# Patient Record
Sex: Male | Born: 1940 | Race: White | Hispanic: No | Marital: Married
Health system: Southern US, Community
[De-identification: ages and names within clinical notes are randomized; demographics above are authoritative.]

## PROBLEM LIST (undated history)

## (undated) DIAGNOSIS — G2581 Restless legs syndrome: Secondary | ICD-10-CM

## (undated) DIAGNOSIS — C669 Malignant neoplasm of unspecified ureter: Secondary | ICD-10-CM

## (undated) DIAGNOSIS — D131 Benign neoplasm of stomach: Secondary | ICD-10-CM

## (undated) DIAGNOSIS — M5136 Other intervertebral disc degeneration, lumbar region: Secondary | ICD-10-CM

## (undated) DIAGNOSIS — G4700 Insomnia, unspecified: Secondary | ICD-10-CM

## (undated) DIAGNOSIS — I491 Atrial premature depolarization: Secondary | ICD-10-CM

## (undated) DIAGNOSIS — Z860100 Personal history of colon polyps, unspecified: Secondary | ICD-10-CM

## (undated) DIAGNOSIS — K635 Polyp of colon: Secondary | ICD-10-CM

## (undated) DIAGNOSIS — E875 Hyperkalemia: Secondary | ICD-10-CM

## (undated) DIAGNOSIS — M810 Age-related osteoporosis without current pathological fracture: Secondary | ICD-10-CM

## (undated) DIAGNOSIS — K2970 Gastritis, unspecified, without bleeding: Secondary | ICD-10-CM

## (undated) DIAGNOSIS — G4730 Sleep apnea, unspecified: Secondary | ICD-10-CM

## (undated) DIAGNOSIS — M5030 Other cervical disc degeneration, unspecified cervical region: Secondary | ICD-10-CM

## (undated) DIAGNOSIS — C61 Malignant neoplasm of prostate: Secondary | ICD-10-CM

## (undated) DIAGNOSIS — M8580 Other specified disorders of bone density and structure, unspecified site: Secondary | ICD-10-CM

## (undated) DIAGNOSIS — N183 Chronic kidney disease, stage 3 unspecified: Secondary | ICD-10-CM

## (undated) DIAGNOSIS — C4490 Unspecified malignant neoplasm of skin, unspecified: Secondary | ICD-10-CM

## (undated) DIAGNOSIS — R001 Bradycardia, unspecified: Secondary | ICD-10-CM

## (undated) DIAGNOSIS — G4733 Obstructive sleep apnea (adult) (pediatric): Secondary | ICD-10-CM

## (undated) DIAGNOSIS — H269 Unspecified cataract: Secondary | ICD-10-CM

## (undated) DIAGNOSIS — K317 Polyp of stomach and duodenum: Secondary | ICD-10-CM

## (undated) DIAGNOSIS — R319 Hematuria, unspecified: Secondary | ICD-10-CM

## (undated) DIAGNOSIS — C801 Malignant (primary) neoplasm, unspecified: Secondary | ICD-10-CM

## (undated) DIAGNOSIS — M545 Low back pain, unspecified: Secondary | ICD-10-CM

## (undated) DIAGNOSIS — C9000 Multiple myeloma not having achieved remission: Secondary | ICD-10-CM

## (undated) DIAGNOSIS — M51369 Other intervertebral disc degeneration, lumbar region without mention of lumbar back pain or lower extremity pain: Secondary | ICD-10-CM

## (undated) DIAGNOSIS — D649 Anemia, unspecified: Secondary | ICD-10-CM

## (undated) DIAGNOSIS — K625 Hemorrhage of anus and rectum: Secondary | ICD-10-CM

## (undated) DIAGNOSIS — K2270 Barrett's esophagus without dysplasia: Secondary | ICD-10-CM

## (undated) DIAGNOSIS — S7290XA Unspecified fracture of unspecified femur, initial encounter for closed fracture: Secondary | ICD-10-CM

## (undated) DIAGNOSIS — I10 Essential (primary) hypertension: Secondary | ICD-10-CM

## (undated) DIAGNOSIS — K769 Liver disease, unspecified: Secondary | ICD-10-CM

## (undated) DIAGNOSIS — N189 Chronic kidney disease, unspecified: Secondary | ICD-10-CM

## (undated) DIAGNOSIS — K219 Gastro-esophageal reflux disease without esophagitis: Secondary | ICD-10-CM

## (undated) HISTORY — PX: TONSILLECTOMY: SUR1361

## (undated) HISTORY — PX: EYE SURGERY: SHX253

## (undated) HISTORY — PX: FLEXIBLE SIGMOIDOSCOPY: SHX1649

## (undated) HISTORY — PX: OTHER SURGICAL HISTORY: SHX169

## (undated) HISTORY — PX: BAND HEMORRHOIDECTOMY: SHX1213

## (undated) HISTORY — PX: FRACTURE SURGERY: SHX138

## (undated) HISTORY — PX: COLONOSCOPY: SHX5424

## (undated) HISTORY — PX: ORIF DISTAL FEMUR FRACTURE: SUR926

---

## 2004-11-07 ENCOUNTER — Ambulatory Visit: Payer: Self-pay | Admitting: Orthopedic Surgery

## 2007-02-12 ENCOUNTER — Ambulatory Visit: Payer: Self-pay | Admitting: Gastroenterology

## 2007-05-27 ENCOUNTER — Ambulatory Visit: Payer: Self-pay | Admitting: Gastroenterology

## 2007-10-28 ENCOUNTER — Ambulatory Visit: Payer: Self-pay | Admitting: Gastroenterology

## 2008-02-10 ENCOUNTER — Ambulatory Visit: Payer: Self-pay | Admitting: Gastroenterology

## 2008-05-06 ENCOUNTER — Ambulatory Visit: Payer: Self-pay | Admitting: Gastroenterology

## 2009-09-16 ENCOUNTER — Ambulatory Visit: Payer: Self-pay | Admitting: Surgery

## 2009-09-21 ENCOUNTER — Ambulatory Visit: Payer: Self-pay | Admitting: Surgery

## 2010-03-29 ENCOUNTER — Ambulatory Visit: Payer: Self-pay | Admitting: Gastroenterology

## 2011-05-22 ENCOUNTER — Ambulatory Visit: Payer: Self-pay | Admitting: Internal Medicine

## 2011-05-25 ENCOUNTER — Ambulatory Visit: Payer: Self-pay | Admitting: Internal Medicine

## 2011-06-04 ENCOUNTER — Ambulatory Visit: Payer: Self-pay | Admitting: Internal Medicine

## 2011-06-06 ENCOUNTER — Ambulatory Visit: Payer: Self-pay | Admitting: Internal Medicine

## 2011-07-05 ENCOUNTER — Ambulatory Visit: Payer: Self-pay | Admitting: Internal Medicine

## 2011-10-04 ENCOUNTER — Ambulatory Visit: Payer: Self-pay | Admitting: Internal Medicine

## 2012-05-24 ENCOUNTER — Ambulatory Visit: Payer: Self-pay | Admitting: Gastroenterology

## 2012-05-28 LAB — PATHOLOGY REPORT

## 2012-08-13 ENCOUNTER — Ambulatory Visit: Payer: Self-pay

## 2012-09-03 ENCOUNTER — Ambulatory Visit: Payer: Self-pay | Admitting: Internal Medicine

## 2012-10-04 ENCOUNTER — Ambulatory Visit: Payer: Self-pay | Admitting: Internal Medicine

## 2013-02-01 ENCOUNTER — Ambulatory Visit: Payer: Self-pay | Admitting: Internal Medicine

## 2013-02-12 ENCOUNTER — Ambulatory Visit: Payer: Self-pay | Admitting: Internal Medicine

## 2013-02-12 LAB — HEPATIC FUNCTION PANEL A (ARMC)
Albumin: 3.3 g/dL — ABNORMAL LOW (ref 3.4–5.0)
Alkaline Phosphatase: 98 U/L (ref 50–136)
Bilirubin, Direct: 0.1 mg/dL (ref 0.00–0.20)
Bilirubin,Total: 0.5 mg/dL (ref 0.2–1.0)

## 2013-02-12 LAB — CBC CANCER CENTER
Basophil #: 0 x10 3/mm (ref 0.0–0.1)
Eosinophil #: 0.2 x10 3/mm (ref 0.0–0.7)
Eosinophil %: 4.5 %
HCT: 46.9 % (ref 40.0–52.0)
HGB: 15.4 g/dL (ref 13.0–18.0)
Lymphocyte #: 1.3 x10 3/mm (ref 1.0–3.6)
Lymphocyte %: 23.7 %
MCH: 29 pg (ref 26.0–34.0)
MCHC: 33 g/dL (ref 32.0–36.0)
MCV: 88 fL (ref 80–100)
Neutrophil %: 59.9 %
Platelet: 171 x10 3/mm (ref 150–440)
RBC: 5.33 10*6/uL (ref 4.40–5.90)
RDW: 13.7 % (ref 11.5–14.5)
WBC: 5.3 x10 3/mm (ref 3.8–10.6)

## 2013-02-12 LAB — CREATININE, SERUM
Creatinine: 1.46 mg/dL — ABNORMAL HIGH (ref 0.60–1.30)
EGFR (African American): 55 — ABNORMAL LOW
EGFR (Non-African Amer.): 47 — ABNORMAL LOW

## 2013-03-04 ENCOUNTER — Ambulatory Visit: Payer: Self-pay | Admitting: Internal Medicine

## 2013-05-26 ENCOUNTER — Ambulatory Visit: Payer: Self-pay | Admitting: Gastroenterology

## 2013-05-27 LAB — PATHOLOGY REPORT

## 2013-12-30 DIAGNOSIS — L723 Sebaceous cyst: Secondary | ICD-10-CM | POA: Diagnosis not present

## 2013-12-30 DIAGNOSIS — Z85828 Personal history of other malignant neoplasm of skin: Secondary | ICD-10-CM | POA: Diagnosis not present

## 2013-12-30 DIAGNOSIS — D235 Other benign neoplasm of skin of trunk: Secondary | ICD-10-CM | POA: Diagnosis not present

## 2014-03-30 DIAGNOSIS — H40019 Open angle with borderline findings, low risk, unspecified eye: Secondary | ICD-10-CM | POA: Diagnosis not present

## 2014-04-29 DIAGNOSIS — S61209A Unspecified open wound of unspecified finger without damage to nail, initial encounter: Secondary | ICD-10-CM | POA: Diagnosis not present

## 2014-05-18 DIAGNOSIS — S61209A Unspecified open wound of unspecified finger without damage to nail, initial encounter: Secondary | ICD-10-CM | POA: Diagnosis not present

## 2014-05-18 DIAGNOSIS — Z4802 Encounter for removal of sutures: Secondary | ICD-10-CM | POA: Diagnosis not present

## 2014-05-26 DIAGNOSIS — K2270 Barrett's esophagus without dysplasia: Secondary | ICD-10-CM | POA: Insufficient documentation

## 2014-05-26 DIAGNOSIS — IMO0001 Reserved for inherently not codable concepts without codable children: Secondary | ICD-10-CM | POA: Diagnosis not present

## 2014-06-09 ENCOUNTER — Ambulatory Visit: Payer: Self-pay | Admitting: Gastroenterology

## 2014-06-09 DIAGNOSIS — K229 Disease of esophagus, unspecified: Secondary | ICD-10-CM | POA: Diagnosis not present

## 2014-06-09 DIAGNOSIS — K2100 Gastro-esophageal reflux disease with esophagitis, without bleeding: Secondary | ICD-10-CM | POA: Diagnosis not present

## 2014-06-09 DIAGNOSIS — K2270 Barrett's esophagus without dysplasia: Secondary | ICD-10-CM | POA: Diagnosis not present

## 2014-06-09 DIAGNOSIS — K219 Gastro-esophageal reflux disease without esophagitis: Secondary | ICD-10-CM | POA: Diagnosis not present

## 2014-06-09 DIAGNOSIS — Z79899 Other long term (current) drug therapy: Secondary | ICD-10-CM | POA: Diagnosis not present

## 2014-06-09 DIAGNOSIS — Z885 Allergy status to narcotic agent status: Secondary | ICD-10-CM | POA: Diagnosis not present

## 2014-06-09 DIAGNOSIS — Z8719 Personal history of other diseases of the digestive system: Secondary | ICD-10-CM | POA: Diagnosis not present

## 2014-06-09 DIAGNOSIS — Z87891 Personal history of nicotine dependence: Secondary | ICD-10-CM | POA: Diagnosis not present

## 2014-06-09 DIAGNOSIS — IMO0002 Reserved for concepts with insufficient information to code with codable children: Secondary | ICD-10-CM | POA: Diagnosis not present

## 2014-06-09 LAB — MAGNESIUM: Magnesium: 2 mg/dL

## 2014-06-09 LAB — CALCIUM: Calcium, Total: 8.4 mg/dL — ABNORMAL LOW (ref 8.5–10.1)

## 2014-06-11 LAB — PATHOLOGY REPORT

## 2014-06-30 DIAGNOSIS — Z8719 Personal history of other diseases of the digestive system: Secondary | ICD-10-CM | POA: Diagnosis not present

## 2014-06-30 DIAGNOSIS — D485 Neoplasm of uncertain behavior of skin: Secondary | ICD-10-CM | POA: Diagnosis not present

## 2014-06-30 DIAGNOSIS — K2100 Gastro-esophageal reflux disease with esophagitis, without bleeding: Secondary | ICD-10-CM | POA: Diagnosis not present

## 2014-06-30 DIAGNOSIS — L570 Actinic keratosis: Secondary | ICD-10-CM | POA: Diagnosis not present

## 2014-06-30 DIAGNOSIS — L820 Inflamed seborrheic keratosis: Secondary | ICD-10-CM | POA: Diagnosis not present

## 2014-06-30 DIAGNOSIS — D2330 Other benign neoplasm of skin of unspecified part of face: Secondary | ICD-10-CM | POA: Diagnosis not present

## 2014-06-30 DIAGNOSIS — Z85828 Personal history of other malignant neoplasm of skin: Secondary | ICD-10-CM | POA: Diagnosis not present

## 2014-07-30 DIAGNOSIS — H40019 Open angle with borderline findings, low risk, unspecified eye: Secondary | ICD-10-CM | POA: Diagnosis not present

## 2014-07-30 DIAGNOSIS — H35379 Puckering of macula, unspecified eye: Secondary | ICD-10-CM | POA: Diagnosis not present

## 2014-07-30 DIAGNOSIS — Z961 Presence of intraocular lens: Secondary | ICD-10-CM | POA: Diagnosis not present

## 2014-07-30 DIAGNOSIS — H18519 Endothelial corneal dystrophy, unspecified eye: Secondary | ICD-10-CM | POA: Diagnosis not present

## 2014-09-11 DIAGNOSIS — J209 Acute bronchitis, unspecified: Secondary | ICD-10-CM | POA: Diagnosis not present

## 2014-09-22 DIAGNOSIS — J209 Acute bronchitis, unspecified: Secondary | ICD-10-CM | POA: Diagnosis not present

## 2014-09-28 DIAGNOSIS — H40013 Open angle with borderline findings, low risk, bilateral: Secondary | ICD-10-CM | POA: Diagnosis not present

## 2014-10-20 DIAGNOSIS — M722 Plantar fascial fibromatosis: Secondary | ICD-10-CM | POA: Diagnosis not present

## 2014-10-30 DIAGNOSIS — Z23 Encounter for immunization: Secondary | ICD-10-CM | POA: Diagnosis not present

## 2014-11-03 DIAGNOSIS — M722 Plantar fascial fibromatosis: Secondary | ICD-10-CM | POA: Diagnosis not present

## 2014-11-03 DIAGNOSIS — R29898 Other symptoms and signs involving the musculoskeletal system: Secondary | ICD-10-CM | POA: Diagnosis not present

## 2014-12-04 HISTORY — PX: FRACTURE SURGERY: SHX138

## 2014-12-29 DIAGNOSIS — D2261 Melanocytic nevi of right upper limb, including shoulder: Secondary | ICD-10-CM | POA: Diagnosis not present

## 2014-12-29 DIAGNOSIS — D225 Melanocytic nevi of trunk: Secondary | ICD-10-CM | POA: Diagnosis not present

## 2014-12-29 DIAGNOSIS — Z85828 Personal history of other malignant neoplasm of skin: Secondary | ICD-10-CM | POA: Diagnosis not present

## 2014-12-29 DIAGNOSIS — D2262 Melanocytic nevi of left upper limb, including shoulder: Secondary | ICD-10-CM | POA: Diagnosis not present

## 2015-01-22 DIAGNOSIS — J029 Acute pharyngitis, unspecified: Secondary | ICD-10-CM | POA: Diagnosis not present

## 2015-03-22 ENCOUNTER — Inpatient Hospital Stay
Admit: 2015-03-22 | Disposition: A | Discharge status: Home / Self Care | Payer: Self-pay | Attending: Internal Medicine | Admitting: Internal Medicine

## 2015-03-22 DIAGNOSIS — K2270 Barrett's esophagus without dysplasia: Secondary | ICD-10-CM | POA: Diagnosis present

## 2015-03-22 DIAGNOSIS — N179 Acute kidney failure, unspecified: Secondary | ICD-10-CM | POA: Diagnosis not present

## 2015-03-22 DIAGNOSIS — Z85828 Personal history of other malignant neoplasm of skin: Secondary | ICD-10-CM | POA: Diagnosis not present

## 2015-03-22 DIAGNOSIS — E860 Dehydration: Secondary | ICD-10-CM | POA: Diagnosis not present

## 2015-03-22 DIAGNOSIS — S72001D Fracture of unspecified part of neck of right femur, subsequent encounter for closed fracture with routine healing: Secondary | ICD-10-CM | POA: Diagnosis not present

## 2015-03-22 DIAGNOSIS — S72001A Fracture of unspecified part of neck of right femur, initial encounter for closed fracture: Secondary | ICD-10-CM | POA: Diagnosis not present

## 2015-03-22 DIAGNOSIS — Z8249 Family history of ischemic heart disease and other diseases of the circulatory system: Secondary | ICD-10-CM | POA: Diagnosis not present

## 2015-03-22 DIAGNOSIS — S72034A Nondisplaced midcervical fracture of right femur, initial encounter for closed fracture: Secondary | ICD-10-CM | POA: Diagnosis not present

## 2015-03-22 DIAGNOSIS — Z9889 Other specified postprocedural states: Secondary | ICD-10-CM | POA: Diagnosis not present

## 2015-03-22 DIAGNOSIS — Z888 Allergy status to other drugs, medicaments and biological substances status: Secondary | ICD-10-CM | POA: Diagnosis not present

## 2015-03-22 DIAGNOSIS — Z79899 Other long term (current) drug therapy: Secondary | ICD-10-CM | POA: Diagnosis not present

## 2015-03-22 LAB — CBC
HCT: 46.8 % (ref 40.0–52.0)
HGB: 15.8 g/dL (ref 13.0–18.0)
MCH: 29.4 pg (ref 26.0–34.0)
MCHC: 33.6 g/dL (ref 32.0–36.0)
MCV: 87 fL (ref 80–100)
Platelet: 177 10*3/uL (ref 150–440)
RBC: 5.36 10*6/uL (ref 4.40–5.90)
RDW: 13.6 % (ref 11.5–14.5)
WBC: 10.3 10*3/uL (ref 3.8–10.6)

## 2015-03-22 LAB — URINALYSIS, COMPLETE
Bilirubin,UR: NEGATIVE
Glucose,UR: NEGATIVE mg/dL (ref 0–75)
Leukocyte Esterase: NEGATIVE
Nitrite: NEGATIVE
Ph: 5 (ref 4.5–8.0)
Protein: NEGATIVE
Specific Gravity: 1.015 (ref 1.003–1.030)

## 2015-03-22 LAB — COMPREHENSIVE METABOLIC PANEL
ALT: 21 U/L
Albumin: 3.9 g/dL
Alkaline Phosphatase: 70 U/L
Anion Gap: 6 — ABNORMAL LOW (ref 7–16)
BILIRUBIN TOTAL: 0.8 mg/dL
BUN: 24 mg/dL — ABNORMAL HIGH
CREATININE: 1.39 mg/dL — AB
Calcium, Total: 8.6 mg/dL — ABNORMAL LOW
Chloride: 108 mmol/L
Co2: 25 mmol/L
GFR CALC AF AMER: 57 — AB
GFR CALC NON AF AMER: 49 — AB
GLUCOSE: 112 mg/dL — AB
POTASSIUM: 4.6 mmol/L
SGOT(AST): 26 U/L
SODIUM: 139 mmol/L
TOTAL PROTEIN: 6.9 g/dL

## 2015-03-22 LAB — APTT: ACTIVATED PTT: 27.5 s (ref 23.6–35.9)

## 2015-03-22 LAB — PROTIME-INR
INR: 1
Prothrombin Time: 13.2 secs

## 2015-03-23 LAB — BASIC METABOLIC PANEL
ANION GAP: 7 (ref 7–16)
BUN: 17 mg/dL
CREATININE: 1.41 mg/dL — AB
Calcium, Total: 8.2 mg/dL — ABNORMAL LOW
Chloride: 108 mmol/L
Co2: 24 mmol/L
EGFR (African American): 56 — ABNORMAL LOW
EGFR (Non-African Amer.): 48 — ABNORMAL LOW
Glucose: 102 mg/dL — ABNORMAL HIGH
Potassium: 4.1 mmol/L
Sodium: 139 mmol/L

## 2015-03-23 LAB — CBC WITH DIFFERENTIAL/PLATELET
Basophil #: 0 10*3/uL (ref 0.0–0.1)
Basophil %: 0.4 %
Eosinophil #: 0.4 10*3/uL (ref 0.0–0.7)
Eosinophil %: 4.8 %
HCT: 44.6 % (ref 40.0–52.0)
HGB: 15.1 g/dL (ref 13.0–18.0)
LYMPHS PCT: 12.3 %
Lymphocyte #: 1 10*3/uL (ref 1.0–3.6)
MCH: 29.5 pg (ref 26.0–34.0)
MCHC: 33.8 g/dL (ref 32.0–36.0)
MCV: 87 fL (ref 80–100)
Monocyte #: 0.8 x10 3/mm (ref 0.2–1.0)
Monocyte %: 10.5 %
NEUTROS PCT: 72 %
Neutrophil #: 5.8 10*3/uL (ref 1.4–6.5)
Platelet: 147 10*3/uL — ABNORMAL LOW (ref 150–440)
RBC: 5.11 10*6/uL (ref 4.40–5.90)
RDW: 13.2 % (ref 11.5–14.5)
WBC: 8 10*3/uL (ref 3.8–10.6)

## 2015-03-23 LAB — MAGNESIUM: MAGNESIUM: 1.9 mg/dL

## 2015-03-24 LAB — CBC WITH DIFFERENTIAL/PLATELET
Basophil #: 0 10*3/uL (ref 0.0–0.1)
Basophil %: 0.2 %
EOS PCT: 1.7 %
Eosinophil #: 0.2 10*3/uL (ref 0.0–0.7)
HCT: 44.1 % (ref 40.0–52.0)
HGB: 14.5 g/dL (ref 13.0–18.0)
Lymphocyte #: 0.8 10*3/uL — ABNORMAL LOW (ref 1.0–3.6)
Lymphocyte %: 8 %
MCH: 28.9 pg (ref 26.0–34.0)
MCHC: 32.8 g/dL (ref 32.0–36.0)
MCV: 88 fL (ref 80–100)
MONOS PCT: 10.6 %
Monocyte #: 1.1 x10 3/mm — ABNORMAL HIGH (ref 0.2–1.0)
NEUTROS ABS: 7.9 10*3/uL — AB (ref 1.4–6.5)
Neutrophil %: 79.5 %
Platelet: 152 10*3/uL (ref 150–440)
RBC: 5 10*6/uL (ref 4.40–5.90)
RDW: 13.5 % (ref 11.5–14.5)
WBC: 10 10*3/uL (ref 3.8–10.6)

## 2015-03-24 LAB — BASIC METABOLIC PANEL
ANION GAP: 7 (ref 7–16)
BUN: 20 mg/dL
CALCIUM: 8.2 mg/dL — AB
CO2: 24 mmol/L
Chloride: 108 mmol/L
Creatinine: 1.24 mg/dL
EGFR (Non-African Amer.): 57 — ABNORMAL LOW
GLUCOSE: 113 mg/dL — AB
Potassium: 4.4 mmol/L
Sodium: 139 mmol/L

## 2015-03-25 LAB — CBC WITH DIFFERENTIAL/PLATELET
BASOS ABS: 0 10*3/uL (ref 0.0–0.1)
Basophil %: 0.3 %
EOS ABS: 0.4 10*3/uL (ref 0.0–0.7)
Eosinophil %: 4.9 %
HCT: 44.3 % (ref 40.0–52.0)
HGB: 14.4 g/dL (ref 13.0–18.0)
LYMPHS PCT: 9.5 %
Lymphocyte #: 0.8 10*3/uL — ABNORMAL LOW (ref 1.0–3.6)
MCH: 28.7 pg (ref 26.0–34.0)
MCHC: 32.5 g/dL (ref 32.0–36.0)
MCV: 88 fL (ref 80–100)
MONOS PCT: 9.9 %
Monocyte #: 0.8 x10 3/mm (ref 0.2–1.0)
NEUTROS PCT: 75.4 %
Neutrophil #: 6.3 10*3/uL (ref 1.4–6.5)
PLATELETS: 142 10*3/uL — AB (ref 150–440)
RBC: 5.03 10*6/uL (ref 4.40–5.90)
RDW: 13.3 % (ref 11.5–14.5)
WBC: 8.3 10*3/uL (ref 3.8–10.6)

## 2015-03-27 DIAGNOSIS — K2270 Barrett's esophagus without dysplasia: Secondary | ICD-10-CM | POA: Diagnosis not present

## 2015-03-27 DIAGNOSIS — Z7902 Long term (current) use of antithrombotics/antiplatelets: Secondary | ICD-10-CM | POA: Diagnosis not present

## 2015-03-27 DIAGNOSIS — Z9181 History of falling: Secondary | ICD-10-CM | POA: Diagnosis not present

## 2015-03-27 DIAGNOSIS — Z85828 Personal history of other malignant neoplasm of skin: Secondary | ICD-10-CM | POA: Diagnosis not present

## 2015-03-27 DIAGNOSIS — S72001D Fracture of unspecified part of neck of right femur, subsequent encounter for closed fracture with routine healing: Secondary | ICD-10-CM | POA: Diagnosis not present

## 2015-03-31 DIAGNOSIS — S72001D Fracture of unspecified part of neck of right femur, subsequent encounter for closed fracture with routine healing: Secondary | ICD-10-CM | POA: Diagnosis not present

## 2015-03-31 DIAGNOSIS — Z85828 Personal history of other malignant neoplasm of skin: Secondary | ICD-10-CM | POA: Diagnosis not present

## 2015-03-31 DIAGNOSIS — Z7902 Long term (current) use of antithrombotics/antiplatelets: Secondary | ICD-10-CM | POA: Diagnosis not present

## 2015-03-31 DIAGNOSIS — Z9181 History of falling: Secondary | ICD-10-CM | POA: Diagnosis not present

## 2015-03-31 DIAGNOSIS — K2270 Barrett's esophagus without dysplasia: Secondary | ICD-10-CM | POA: Diagnosis not present

## 2015-04-02 DIAGNOSIS — K2270 Barrett's esophagus without dysplasia: Secondary | ICD-10-CM | POA: Diagnosis not present

## 2015-04-02 DIAGNOSIS — S72001D Fracture of unspecified part of neck of right femur, subsequent encounter for closed fracture with routine healing: Secondary | ICD-10-CM | POA: Diagnosis not present

## 2015-04-02 DIAGNOSIS — Z7902 Long term (current) use of antithrombotics/antiplatelets: Secondary | ICD-10-CM | POA: Diagnosis not present

## 2015-04-02 DIAGNOSIS — N179 Acute kidney failure, unspecified: Secondary | ICD-10-CM | POA: Diagnosis not present

## 2015-04-02 DIAGNOSIS — Z9181 History of falling: Secondary | ICD-10-CM | POA: Diagnosis not present

## 2015-04-02 DIAGNOSIS — R319 Hematuria, unspecified: Secondary | ICD-10-CM | POA: Insufficient documentation

## 2015-04-02 DIAGNOSIS — Z85828 Personal history of other malignant neoplasm of skin: Secondary | ICD-10-CM | POA: Diagnosis not present
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
## 2015-04-04 NOTE — Consult Note (Signed)
Brief Consult Note: Diagnosis: Difficulty Foley.   Discussed with Attending MD.   Comments: 84 French coud?? Foley catheter placed sterilely without difficulty today in the preoperative holding area.   Urology was asked to place Foley after  possible Foley catheter trauma yesterday evening.  Patient experienced pain  and bleeding after Foley removal last night.  His pain and bleeding had resolved this morning prior to placement.  Primary team to DC Foley as deemed appropriate.  Electronic Signatures: Sherlynn Stalls (MD)  (Signed 19-Apr-16 17:14)  Authored: Brief Consult Note   Last Updated: 19-Apr-16 17:14 by Sherlynn Stalls (MD)
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
## 2015-04-04 NOTE — Discharge Summary (Signed)
PATIENT NAME:  [name], [name] BOOTH MR#:  [id number] DATE OF BIRTH:  Sep 24, 1941  DATE OF ADMISSION:  03/22/2015 DATE OF DISCHARGE:  03/26/2015  PRIMARY CARE PHYSICIAN:  [name] Prows, MD  DISCHARGE DIAGNOSES:  1. Right hip fracture status post surgery.  2. Acute renal failure with dehydration.  3. History of skin cancer.   SURGERY:  Internal fixation of  hip fracture on the right side.   CODE STATUS: FULL CODE.   CONDITION: Stable.   HOME MEDICATIONS: Please refer to the medication reconciliation list.   DIET: Regular diet.   ACTIVITY: As tolerated. The patient needs home health with physical therapy and nurse aide.   FOLLOWUP CARE: With PCP and [name] [name]. [name] Guise, MD, within 1-2 weeks.   REASON FOR ADMISSION: Fall and right hip injury.   HOSPITAL COURSE: The patient is a 74 year old Caucasian male with a history of skin cancer, was sent to ED after fall and injured his right hip.   For detailed history and physical examinations, please refer to the admission note dictated by me. X-ray showed right hip fracture and laboratory data showed BUN 24, creatinine 1.39.  The patient got right hip surgery after admission.  The patient is taking Lovenox for DVT prophylaxis. He tolerated PT well.  For dehydration and acute renal failure, the patient received IV fluid support, renal failure improved. The patient has no complaints. Vital signs are stable. He is clinically stable and will be discharged to home with home health and PT today. I discussed the patient's discharge plan with the patient, the patient's wife, nurse, and case Freight forwarder.   TIME SPENT: About 40 minutes.     ____________________________ [name] Loll, MD qc:tr D: 03/26/2015 14:29:10 ET T: 03/26/2015 16:15:50 ET JOB#: [id number]  cc: [name] Loll, MD, <Dictator> [name] Loll MD ELECTRONICALLY SIGNED 03/26/2015 16:43

## 2015-04-05 DIAGNOSIS — K2270 Barrett's esophagus without dysplasia: Secondary | ICD-10-CM | POA: Diagnosis not present

## 2015-04-05 DIAGNOSIS — S72001D Fracture of unspecified part of neck of right femur, subsequent encounter for closed fracture with routine healing: Secondary | ICD-10-CM | POA: Diagnosis not present

## 2015-04-05 DIAGNOSIS — Z85828 Personal history of other malignant neoplasm of skin: Secondary | ICD-10-CM | POA: Diagnosis not present

## 2015-04-05 DIAGNOSIS — Z7902 Long term (current) use of antithrombotics/antiplatelets: Secondary | ICD-10-CM | POA: Diagnosis not present

## 2015-04-05 DIAGNOSIS — Z9181 History of falling: Secondary | ICD-10-CM | POA: Diagnosis not present

## 2015-04-08 DIAGNOSIS — M25551 Pain in right hip: Secondary | ICD-10-CM | POA: Diagnosis not present

## 2015-04-09 DIAGNOSIS — Z85828 Personal history of other malignant neoplasm of skin: Secondary | ICD-10-CM | POA: Diagnosis not present

## 2015-04-09 DIAGNOSIS — Z7902 Long term (current) use of antithrombotics/antiplatelets: Secondary | ICD-10-CM | POA: Diagnosis not present

## 2015-04-09 DIAGNOSIS — Z9181 History of falling: Secondary | ICD-10-CM | POA: Diagnosis not present

## 2015-04-09 DIAGNOSIS — S72001D Fracture of unspecified part of neck of right femur, subsequent encounter for closed fracture with routine healing: Secondary | ICD-10-CM | POA: Diagnosis not present

## 2015-04-09 DIAGNOSIS — K2270 Barrett's esophagus without dysplasia: Secondary | ICD-10-CM | POA: Diagnosis not present

## 2015-04-12 DIAGNOSIS — K2270 Barrett's esophagus without dysplasia: Secondary | ICD-10-CM | POA: Diagnosis not present

## 2015-04-12 DIAGNOSIS — Z85828 Personal history of other malignant neoplasm of skin: Secondary | ICD-10-CM | POA: Diagnosis not present

## 2015-04-12 DIAGNOSIS — S72001D Fracture of unspecified part of neck of right femur, subsequent encounter for closed fracture with routine healing: Secondary | ICD-10-CM | POA: Diagnosis not present

## 2015-04-12 DIAGNOSIS — Z9181 History of falling: Secondary | ICD-10-CM | POA: Diagnosis not present

## 2015-04-12 DIAGNOSIS — Z7902 Long term (current) use of antithrombotics/antiplatelets: Secondary | ICD-10-CM | POA: Diagnosis not present

## 2015-04-15 DIAGNOSIS — Z7902 Long term (current) use of antithrombotics/antiplatelets: Secondary | ICD-10-CM | POA: Diagnosis not present

## 2015-04-15 DIAGNOSIS — Z85828 Personal history of other malignant neoplasm of skin: Secondary | ICD-10-CM | POA: Diagnosis not present

## 2015-04-15 DIAGNOSIS — S72001D Fracture of unspecified part of neck of right femur, subsequent encounter for closed fracture with routine healing: Secondary | ICD-10-CM | POA: Diagnosis not present

## 2015-04-15 DIAGNOSIS — K2270 Barrett's esophagus without dysplasia: Secondary | ICD-10-CM | POA: Diagnosis not present

## 2015-04-15 DIAGNOSIS — Z9181 History of falling: Secondary | ICD-10-CM | POA: Diagnosis not present

## 2015-04-16 ENCOUNTER — Other Ambulatory Visit: Payer: Self-pay | Admitting: Nephrology

## 2015-04-16 DIAGNOSIS — N183 Chronic kidney disease, stage 3 unspecified: Secondary | ICD-10-CM

## 2015-04-16 DIAGNOSIS — N2581 Secondary hyperparathyroidism of renal origin: Secondary | ICD-10-CM | POA: Diagnosis not present

## 2015-04-16 DIAGNOSIS — R319 Hematuria, unspecified: Secondary | ICD-10-CM

## 2015-04-16 DIAGNOSIS — N179 Acute kidney failure, unspecified: Secondary | ICD-10-CM

## 2015-04-20 ENCOUNTER — Ambulatory Visit
Admission: RE | Admit: 2015-04-20 | Discharge: 2015-04-20 | Disposition: A | Discharge status: Home / Self Care | Payer: Medicare Other | Source: Ambulatory Visit | Attending: Nephrology | Admitting: Nephrology

## 2015-04-20 DIAGNOSIS — N179 Acute kidney failure, unspecified: Secondary | ICD-10-CM

## 2015-04-20 DIAGNOSIS — N183 Chronic kidney disease, stage 3 unspecified: Secondary | ICD-10-CM

## 2015-04-20 DIAGNOSIS — N281 Cyst of kidney, acquired: Secondary | ICD-10-CM | POA: Insufficient documentation

## 2015-04-20 DIAGNOSIS — R319 Hematuria, unspecified: Secondary | ICD-10-CM | POA: Diagnosis present

## 2015-04-24 DIAGNOSIS — N179 Acute kidney failure, unspecified: Secondary | ICD-10-CM | POA: Diagnosis not present

## 2015-04-24 DIAGNOSIS — R319 Hematuria, unspecified: Secondary | ICD-10-CM | POA: Diagnosis not present

## 2015-04-24 DIAGNOSIS — S7291XD Unspecified fracture of right femur, subsequent encounter for closed fracture with routine healing: Secondary | ICD-10-CM | POA: Diagnosis not present

## 2015-04-30 DIAGNOSIS — N179 Acute kidney failure, unspecified: Secondary | ICD-10-CM | POA: Diagnosis not present

## 2015-04-30 DIAGNOSIS — R319 Hematuria, unspecified: Secondary | ICD-10-CM | POA: Diagnosis not present

## 2015-04-30 DIAGNOSIS — I10 Essential (primary) hypertension: Secondary | ICD-10-CM | POA: Diagnosis not present

## 2015-04-30 DIAGNOSIS — N2581 Secondary hyperparathyroidism of renal origin: Secondary | ICD-10-CM | POA: Diagnosis not present

## 2015-04-30 DIAGNOSIS — N183 Chronic kidney disease, stage 3 (moderate): Secondary | ICD-10-CM | POA: Diagnosis not present

## 2015-05-12 DIAGNOSIS — M6281 Muscle weakness (generalized): Secondary | ICD-10-CM | POA: Diagnosis not present

## 2015-05-12 DIAGNOSIS — M25651 Stiffness of right hip, not elsewhere classified: Secondary | ICD-10-CM | POA: Diagnosis not present

## 2015-05-12 DIAGNOSIS — M25551 Pain in right hip: Secondary | ICD-10-CM | POA: Diagnosis not present

## 2015-05-12 DIAGNOSIS — R262 Difficulty in walking, not elsewhere classified: Secondary | ICD-10-CM | POA: Diagnosis not present

## 2015-05-14 DIAGNOSIS — M25651 Stiffness of right hip, not elsewhere classified: Secondary | ICD-10-CM | POA: Diagnosis not present

## 2015-05-14 DIAGNOSIS — M6281 Muscle weakness (generalized): Secondary | ICD-10-CM | POA: Diagnosis not present

## 2015-05-14 DIAGNOSIS — R262 Difficulty in walking, not elsewhere classified: Secondary | ICD-10-CM | POA: Diagnosis not present

## 2015-05-14 DIAGNOSIS — M25551 Pain in right hip: Secondary | ICD-10-CM | POA: Diagnosis not present

## 2015-05-18 DIAGNOSIS — M25651 Stiffness of right hip, not elsewhere classified: Secondary | ICD-10-CM | POA: Diagnosis not present

## 2015-05-18 DIAGNOSIS — M6281 Muscle weakness (generalized): Secondary | ICD-10-CM | POA: Diagnosis not present

## 2015-05-18 DIAGNOSIS — R262 Difficulty in walking, not elsewhere classified: Secondary | ICD-10-CM | POA: Diagnosis not present

## 2015-05-18 DIAGNOSIS — M25551 Pain in right hip: Secondary | ICD-10-CM | POA: Diagnosis not present

## 2015-05-20 DIAGNOSIS — R262 Difficulty in walking, not elsewhere classified: Secondary | ICD-10-CM | POA: Diagnosis not present

## 2015-05-20 DIAGNOSIS — M25651 Stiffness of right hip, not elsewhere classified: Secondary | ICD-10-CM | POA: Diagnosis not present

## 2015-05-20 DIAGNOSIS — J029 Acute pharyngitis, unspecified: Secondary | ICD-10-CM | POA: Diagnosis not present

## 2015-05-20 DIAGNOSIS — M25551 Pain in right hip: Secondary | ICD-10-CM | POA: Diagnosis not present

## 2015-05-20 DIAGNOSIS — M6281 Muscle weakness (generalized): Secondary | ICD-10-CM | POA: Diagnosis not present

## 2015-05-24 DIAGNOSIS — M25651 Stiffness of right hip, not elsewhere classified: Secondary | ICD-10-CM | POA: Diagnosis not present

## 2015-05-24 DIAGNOSIS — R262 Difficulty in walking, not elsewhere classified: Secondary | ICD-10-CM | POA: Diagnosis not present

## 2015-05-24 DIAGNOSIS — M25551 Pain in right hip: Secondary | ICD-10-CM | POA: Diagnosis not present

## 2015-05-24 DIAGNOSIS — M6281 Muscle weakness (generalized): Secondary | ICD-10-CM | POA: Diagnosis not present

## 2015-05-27 DIAGNOSIS — J310 Chronic rhinitis: Secondary | ICD-10-CM | POA: Diagnosis not present

## 2015-05-27 DIAGNOSIS — M25651 Stiffness of right hip, not elsewhere classified: Secondary | ICD-10-CM | POA: Diagnosis not present

## 2015-05-27 DIAGNOSIS — J387 Other diseases of larynx: Secondary | ICD-10-CM | POA: Diagnosis not present

## 2015-05-27 DIAGNOSIS — M25551 Pain in right hip: Secondary | ICD-10-CM | POA: Diagnosis not present

## 2015-05-27 DIAGNOSIS — R262 Difficulty in walking, not elsewhere classified: Secondary | ICD-10-CM | POA: Diagnosis not present

## 2015-05-27 DIAGNOSIS — M6281 Muscle weakness (generalized): Secondary | ICD-10-CM | POA: Diagnosis not present

## 2015-05-27 DIAGNOSIS — R0982 Postnasal drip: Secondary | ICD-10-CM | POA: Diagnosis not present

## 2015-05-27 DIAGNOSIS — J312 Chronic pharyngitis: Secondary | ICD-10-CM | POA: Diagnosis not present

## 2015-06-03 DIAGNOSIS — M25561 Pain in right knee: Secondary | ICD-10-CM | POA: Diagnosis not present

## 2015-06-03 DIAGNOSIS — S72011D Unspecified intracapsular fracture of right femur, subsequent encounter for closed fracture with routine healing: Secondary | ICD-10-CM | POA: Diagnosis not present

## 2015-06-04 DIAGNOSIS — M25561 Pain in right knee: Secondary | ICD-10-CM | POA: Diagnosis not present

## 2015-06-10 DIAGNOSIS — M25551 Pain in right hip: Secondary | ICD-10-CM | POA: Diagnosis not present

## 2015-06-10 DIAGNOSIS — M25651 Stiffness of right hip, not elsewhere classified: Secondary | ICD-10-CM | POA: Diagnosis not present

## 2015-06-10 DIAGNOSIS — R262 Difficulty in walking, not elsewhere classified: Secondary | ICD-10-CM | POA: Diagnosis not present

## 2015-06-10 DIAGNOSIS — M6281 Muscle weakness (generalized): Secondary | ICD-10-CM | POA: Diagnosis not present

## 2015-06-15 DIAGNOSIS — M6281 Muscle weakness (generalized): Secondary | ICD-10-CM | POA: Diagnosis not present

## 2015-06-15 DIAGNOSIS — M25651 Stiffness of right hip, not elsewhere classified: Secondary | ICD-10-CM | POA: Diagnosis not present

## 2015-06-15 DIAGNOSIS — R262 Difficulty in walking, not elsewhere classified: Secondary | ICD-10-CM | POA: Diagnosis not present

## 2015-06-15 DIAGNOSIS — M25551 Pain in right hip: Secondary | ICD-10-CM | POA: Diagnosis not present

## 2015-06-18 DIAGNOSIS — R262 Difficulty in walking, not elsewhere classified: Secondary | ICD-10-CM | POA: Diagnosis not present

## 2015-06-18 DIAGNOSIS — M6281 Muscle weakness (generalized): Secondary | ICD-10-CM | POA: Diagnosis not present

## 2015-06-18 DIAGNOSIS — M25651 Stiffness of right hip, not elsewhere classified: Secondary | ICD-10-CM | POA: Diagnosis not present

## 2015-06-18 DIAGNOSIS — M25551 Pain in right hip: Secondary | ICD-10-CM | POA: Diagnosis not present

## 2015-06-22 DIAGNOSIS — M6281 Muscle weakness (generalized): Secondary | ICD-10-CM | POA: Diagnosis not present

## 2015-06-22 DIAGNOSIS — M25651 Stiffness of right hip, not elsewhere classified: Secondary | ICD-10-CM | POA: Diagnosis not present

## 2015-06-22 DIAGNOSIS — M25551 Pain in right hip: Secondary | ICD-10-CM | POA: Diagnosis not present

## 2015-06-22 DIAGNOSIS — R262 Difficulty in walking, not elsewhere classified: Secondary | ICD-10-CM | POA: Diagnosis not present

## 2015-06-24 DIAGNOSIS — M25551 Pain in right hip: Secondary | ICD-10-CM | POA: Diagnosis not present

## 2015-06-24 DIAGNOSIS — M6281 Muscle weakness (generalized): Secondary | ICD-10-CM | POA: Diagnosis not present

## 2015-06-24 DIAGNOSIS — M25651 Stiffness of right hip, not elsewhere classified: Secondary | ICD-10-CM | POA: Diagnosis not present

## 2015-06-24 DIAGNOSIS — R262 Difficulty in walking, not elsewhere classified: Secondary | ICD-10-CM | POA: Diagnosis not present

## 2015-06-29 DIAGNOSIS — M6281 Muscle weakness (generalized): Secondary | ICD-10-CM | POA: Diagnosis not present

## 2015-06-29 DIAGNOSIS — R262 Difficulty in walking, not elsewhere classified: Secondary | ICD-10-CM | POA: Diagnosis not present

## 2015-06-29 DIAGNOSIS — M25651 Stiffness of right hip, not elsewhere classified: Secondary | ICD-10-CM | POA: Diagnosis not present

## 2015-06-29 DIAGNOSIS — D2262 Melanocytic nevi of left upper limb, including shoulder: Secondary | ICD-10-CM | POA: Diagnosis not present

## 2015-06-29 DIAGNOSIS — D225 Melanocytic nevi of trunk: Secondary | ICD-10-CM | POA: Diagnosis not present

## 2015-06-29 DIAGNOSIS — L570 Actinic keratosis: Secondary | ICD-10-CM | POA: Diagnosis not present

## 2015-06-29 DIAGNOSIS — X32XXXA Exposure to sunlight, initial encounter: Secondary | ICD-10-CM | POA: Diagnosis not present

## 2015-06-29 DIAGNOSIS — Z85828 Personal history of other malignant neoplasm of skin: Secondary | ICD-10-CM | POA: Diagnosis not present

## 2015-06-29 DIAGNOSIS — D2261 Melanocytic nevi of right upper limb, including shoulder: Secondary | ICD-10-CM | POA: Diagnosis not present

## 2015-06-29 DIAGNOSIS — M25551 Pain in right hip: Secondary | ICD-10-CM | POA: Diagnosis not present

## 2015-06-30 DIAGNOSIS — H40013 Open angle with borderline findings, low risk, bilateral: Secondary | ICD-10-CM | POA: Diagnosis not present

## 2015-06-30 DIAGNOSIS — H26491 Other secondary cataract, right eye: Secondary | ICD-10-CM | POA: Diagnosis not present

## 2015-06-30 DIAGNOSIS — Z961 Presence of intraocular lens: Secondary | ICD-10-CM | POA: Diagnosis not present

## 2015-06-30 DIAGNOSIS — H01003 Unspecified blepharitis right eye, unspecified eyelid: Secondary | ICD-10-CM | POA: Diagnosis not present

## 2015-07-02 DIAGNOSIS — M25651 Stiffness of right hip, not elsewhere classified: Secondary | ICD-10-CM | POA: Diagnosis not present

## 2015-07-02 DIAGNOSIS — M6281 Muscle weakness (generalized): Secondary | ICD-10-CM | POA: Diagnosis not present

## 2015-07-02 DIAGNOSIS — M25551 Pain in right hip: Secondary | ICD-10-CM | POA: Diagnosis not present

## 2015-07-02 DIAGNOSIS — R262 Difficulty in walking, not elsewhere classified: Secondary | ICD-10-CM | POA: Diagnosis not present

## 2015-07-08 DIAGNOSIS — M25651 Stiffness of right hip, not elsewhere classified: Secondary | ICD-10-CM | POA: Diagnosis not present

## 2015-07-08 DIAGNOSIS — M25551 Pain in right hip: Secondary | ICD-10-CM | POA: Diagnosis not present

## 2015-07-08 DIAGNOSIS — R262 Difficulty in walking, not elsewhere classified: Secondary | ICD-10-CM | POA: Diagnosis not present

## 2015-07-08 DIAGNOSIS — M6281 Muscle weakness (generalized): Secondary | ICD-10-CM | POA: Diagnosis not present

## 2015-07-13 DIAGNOSIS — M25551 Pain in right hip: Secondary | ICD-10-CM | POA: Diagnosis not present

## 2015-07-19 DIAGNOSIS — S7291XD Unspecified fracture of right femur, subsequent encounter for closed fracture with routine healing: Secondary | ICD-10-CM | POA: Diagnosis not present

## 2015-07-19 DIAGNOSIS — N179 Acute kidney failure, unspecified: Secondary | ICD-10-CM | POA: Diagnosis not present

## 2015-07-26 DIAGNOSIS — N182 Chronic kidney disease, stage 2 (mild): Secondary | ICD-10-CM | POA: Insufficient documentation

## 2015-07-26 DIAGNOSIS — S72051S Unspecified fracture of head of right femur, sequela: Secondary | ICD-10-CM | POA: Diagnosis not present

## 2015-07-26 DIAGNOSIS — K2270 Barrett's esophagus without dysplasia: Secondary | ICD-10-CM | POA: Diagnosis not present

## 2015-07-26 DIAGNOSIS — Z13820 Encounter for screening for osteoporosis: Secondary | ICD-10-CM | POA: Diagnosis not present

## 2015-07-27 DIAGNOSIS — N183 Chronic kidney disease, stage 3 (moderate): Secondary | ICD-10-CM | POA: Diagnosis not present

## 2015-07-27 DIAGNOSIS — I10 Essential (primary) hypertension: Secondary | ICD-10-CM | POA: Diagnosis not present

## 2015-07-27 DIAGNOSIS — R809 Proteinuria, unspecified: Secondary | ICD-10-CM | POA: Diagnosis not present

## 2015-07-27 DIAGNOSIS — R319 Hematuria, unspecified: Secondary | ICD-10-CM | POA: Diagnosis not present

## 2015-08-04 DIAGNOSIS — M8588 Other specified disorders of bone density and structure, other site: Secondary | ICD-10-CM | POA: Diagnosis not present

## 2015-08-04 DIAGNOSIS — S72051S Unspecified fracture of head of right femur, sequela: Secondary | ICD-10-CM | POA: Diagnosis not present

## 2015-08-04 DIAGNOSIS — Z13820 Encounter for screening for osteoporosis: Secondary | ICD-10-CM | POA: Diagnosis not present

## 2015-08-18 DIAGNOSIS — M8580 Other specified disorders of bone density and structure, unspecified site: Secondary | ICD-10-CM | POA: Insufficient documentation

## 2015-08-29 DIAGNOSIS — Z23 Encounter for immunization: Secondary | ICD-10-CM | POA: Diagnosis not present

## 2015-08-29 IMAGING — CR DG HIP (WITH PELVIS) 1V*R*
1 series · 1 of 1 positions shown · non-contrast
Comparison: Same day.

CLINICAL DATA: Postop hip pinning.

EXAM:
RIGHT HIP (WITH PELVIS) 1 VIEW

[lat]
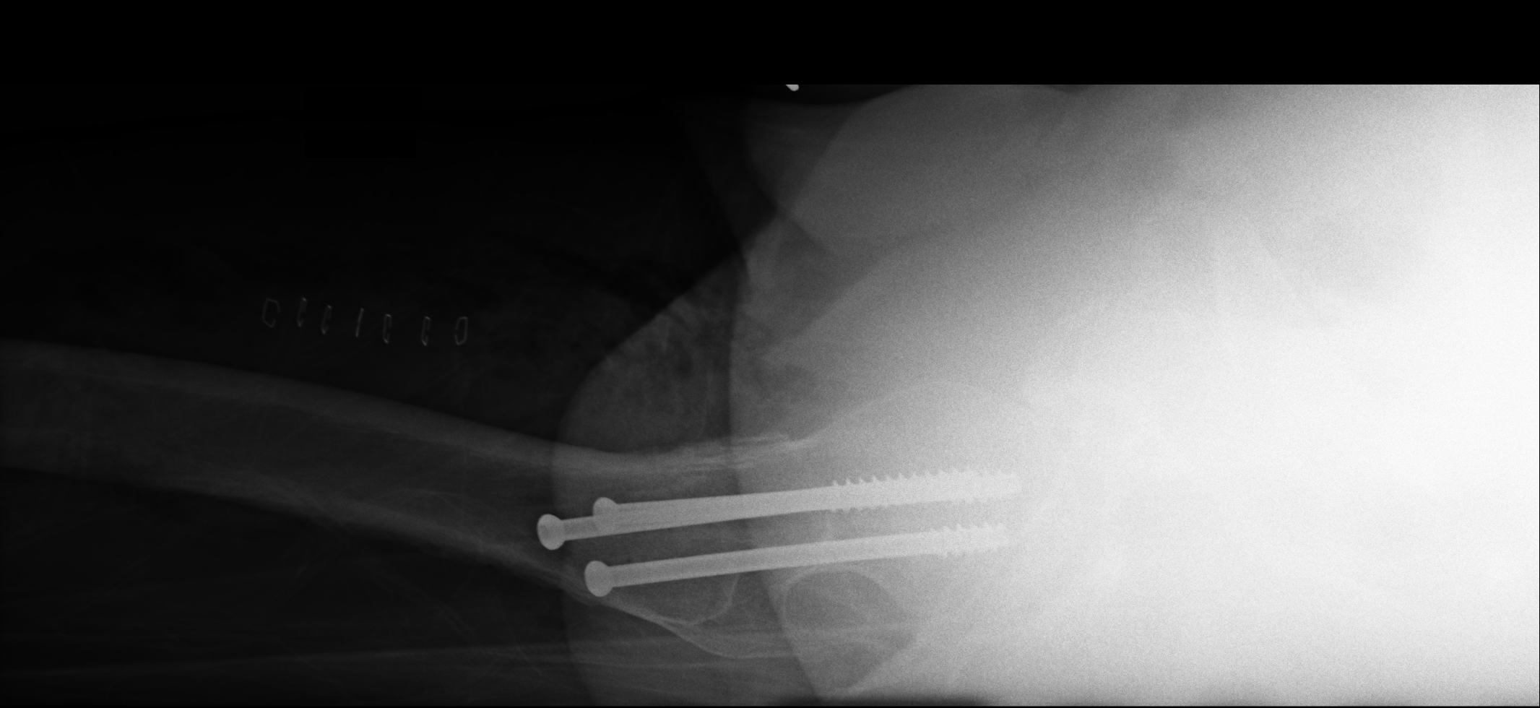

[1 of 1 positions shown; findings below may reference images not displayed]

FINDINGS: Cross-table lateral of right hip demonstrate surgical pins to be in
grossly good position. No dislocation is noted.
IMPRESSION: Status post surgical pinning of right hip.

## 2015-09-09 DIAGNOSIS — Z8781 Personal history of (healed) traumatic fracture: Secondary | ICD-10-CM | POA: Insufficient documentation

## 2015-09-09 DIAGNOSIS — M8580 Other specified disorders of bone density and structure, unspecified site: Secondary | ICD-10-CM | POA: Diagnosis not present

## 2015-09-09 DIAGNOSIS — Z7189 Other specified counseling: Secondary | ICD-10-CM | POA: Diagnosis not present

## 2015-09-09 DIAGNOSIS — N182 Chronic kidney disease, stage 2 (mild): Secondary | ICD-10-CM | POA: Diagnosis not present

## 2015-09-26 IMAGING — US US RENAL
1 series · 14 of 25 positions shown · non-contrast
Comparison: None.

CLINICAL DATA: Acute renal failure. Chronic, stage III renal
disease. Hematuria.

EXAM:
RENAL / URINARY TRACT ULTRASOUND COMPLETE

[Series 1: us renal · 0.28mm/px · 14 of 63 slices shown]
[im 1/63]
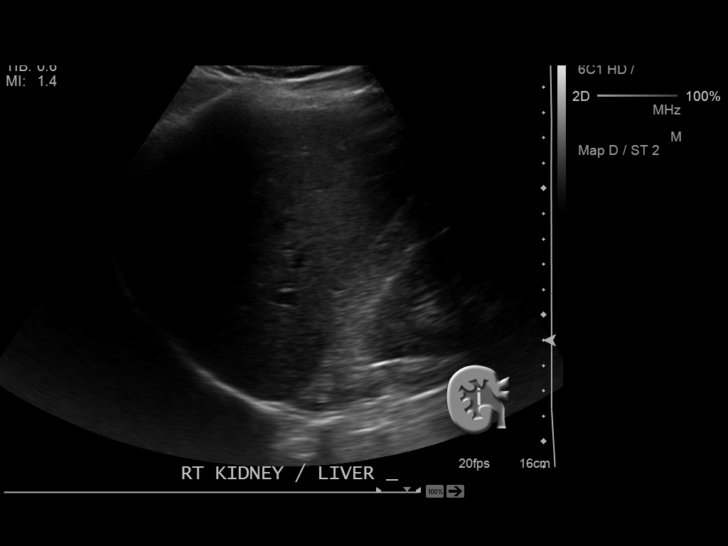
[im 6/63]
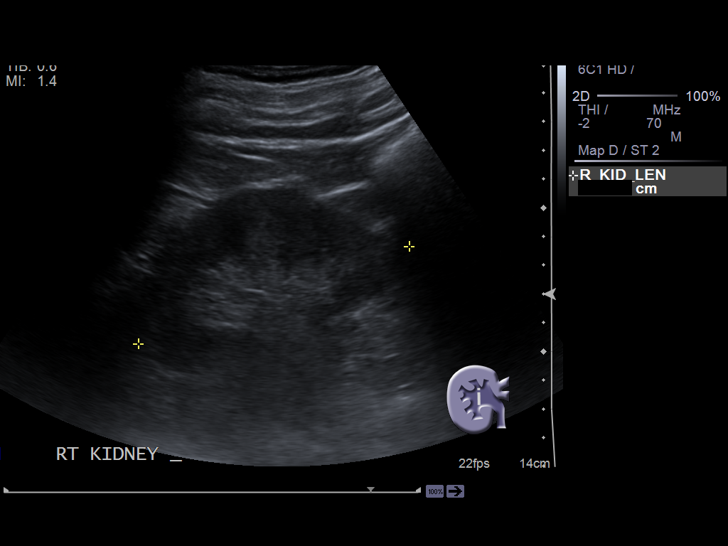
[im 11/63]
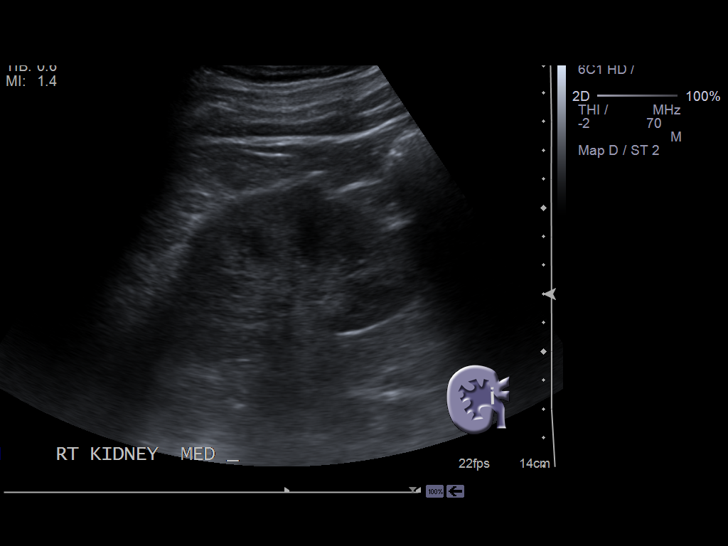
[im 16/63]
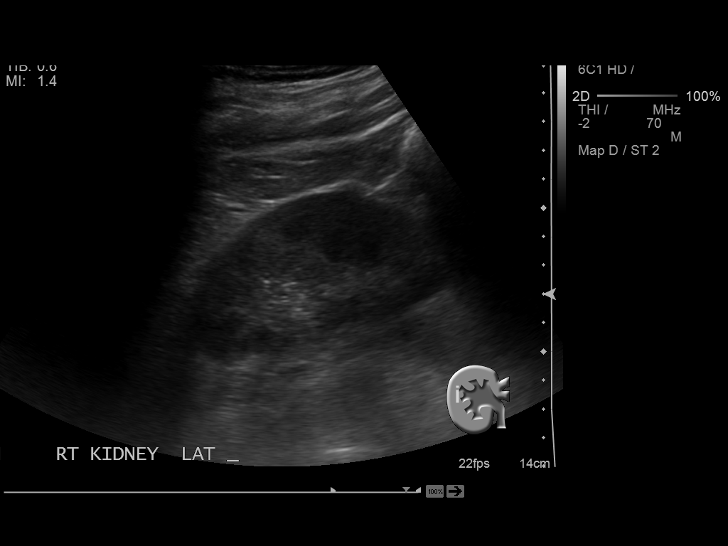
[im 21/63]
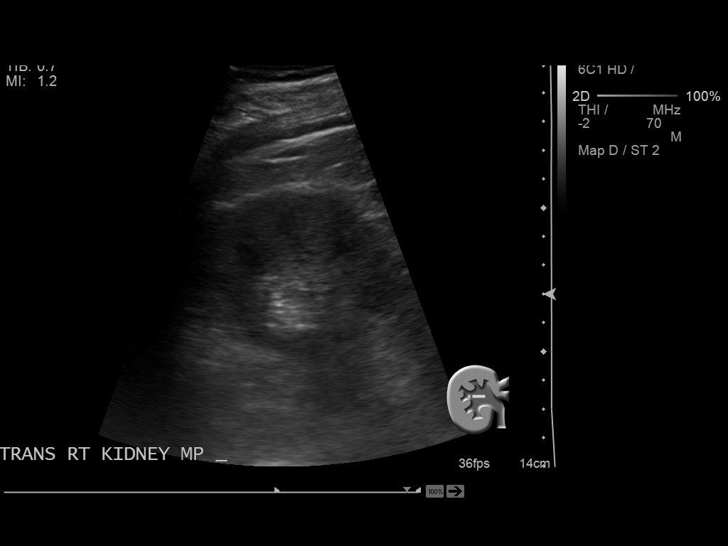
[im 24/63]
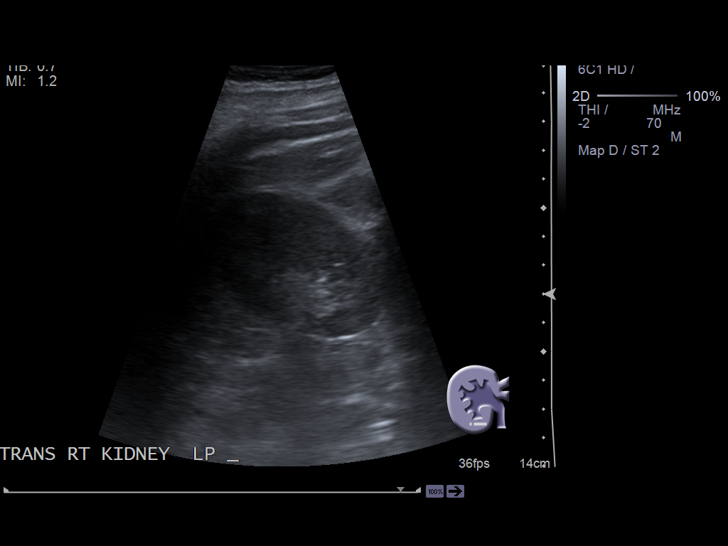
[im 29/63]
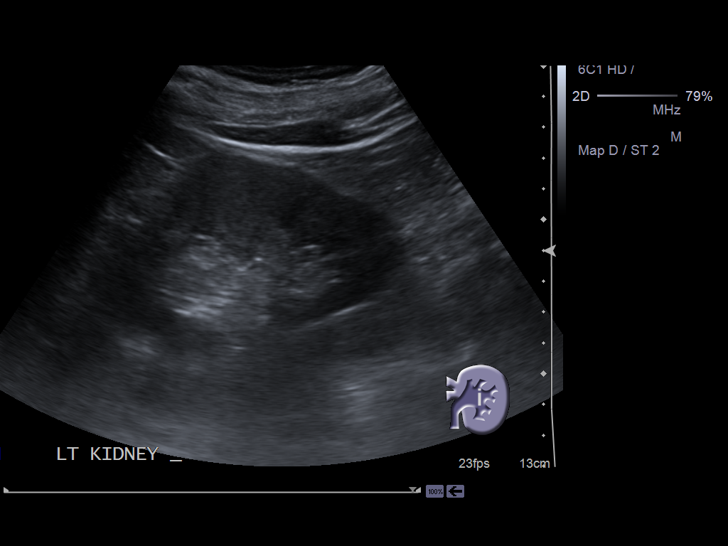
[im 34/63]
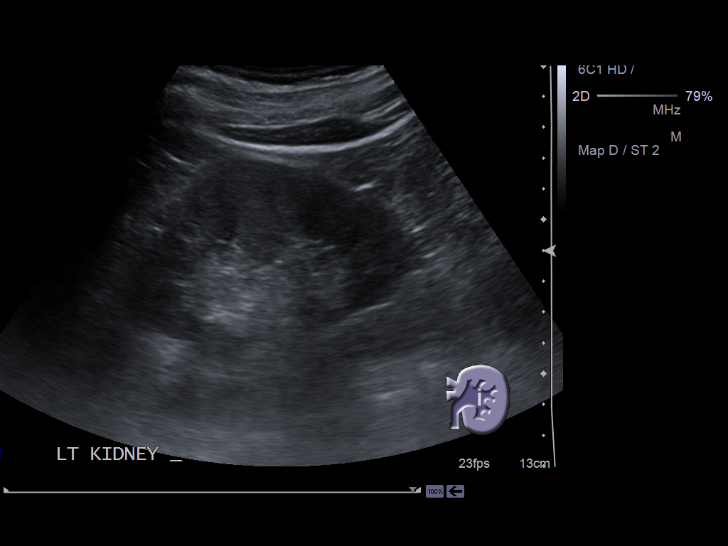
[im 39/63]
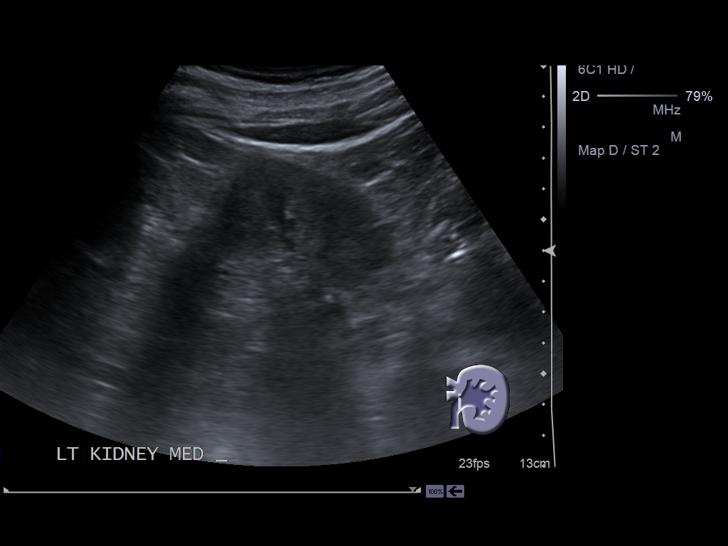
[im 42/63]
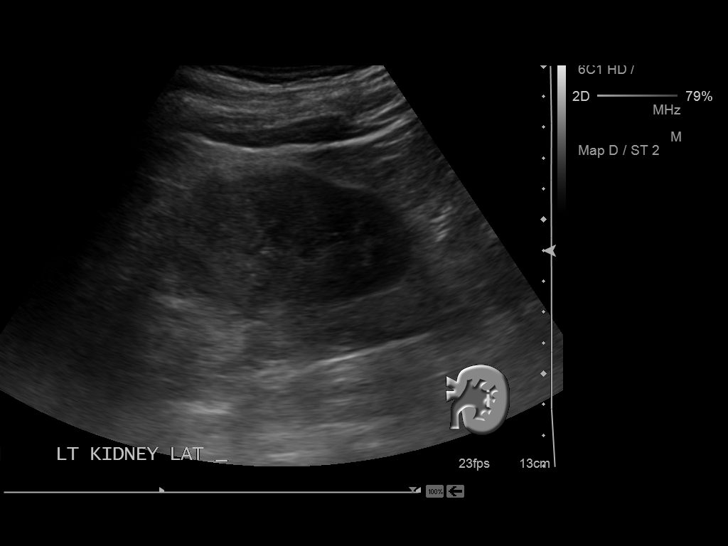
[im 47/63]
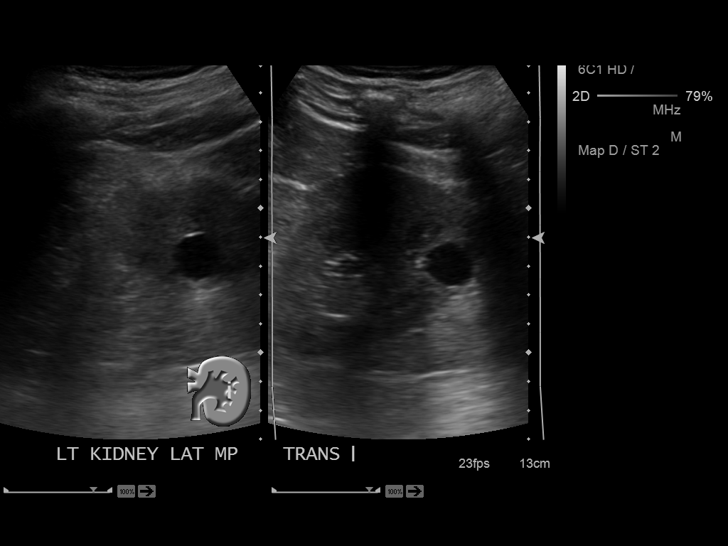
[im 52/63]
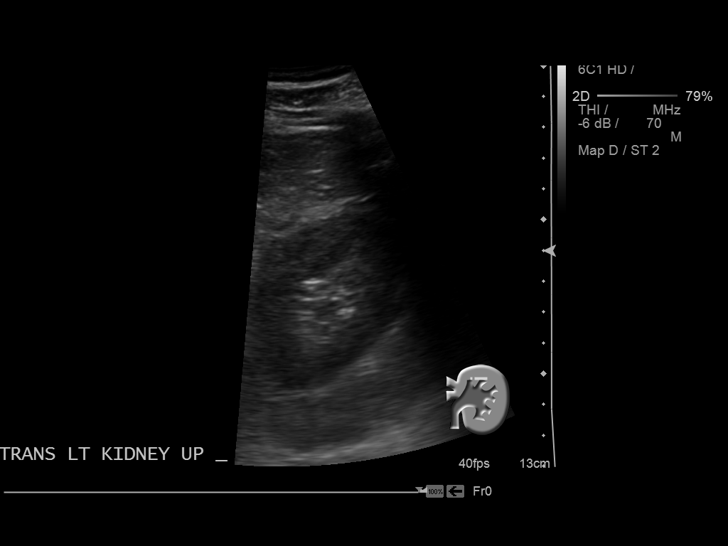
[im 57/63]
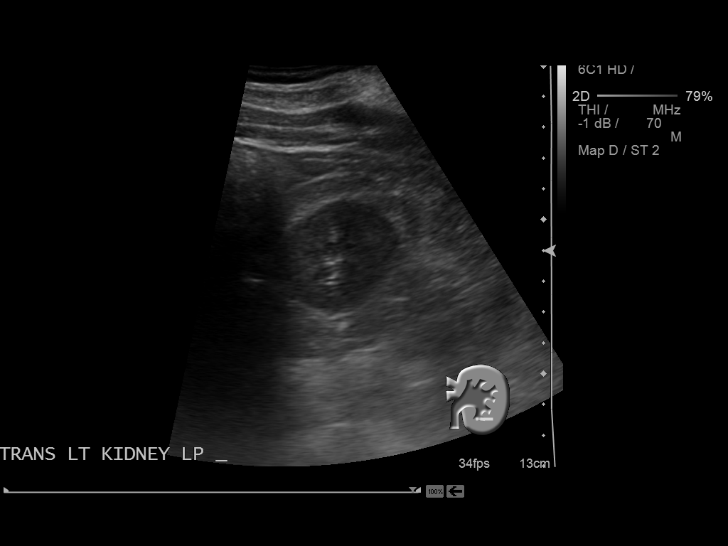
[im 63/63]
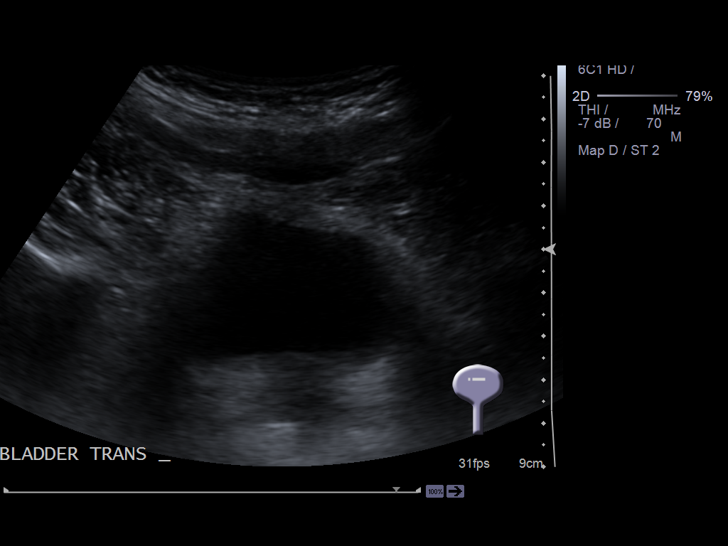

[14 of 25 positions shown; findings below may reference images not displayed]

FINDINGS: Right Kidney:

Length: 10.0 cm. Echogenicity within normal limits. No mass or
hydronephrosis visualized.

Left Kidney:

Length: 10.3 cm. 1.9 cm lower pole simple cyst. Echogenicity within
normal limits. No mass or hydronephrosis visualized.

Bladder:

Appears normal for degree of bladder distention.
IMPRESSION: 1.9 cm left renal cyst. Otherwise, normal examination. No
hydronephrosis.

## 2015-10-05 DIAGNOSIS — M810 Age-related osteoporosis without current pathological fracture: Secondary | ICD-10-CM | POA: Insufficient documentation

## 2015-10-11 DIAGNOSIS — M25511 Pain in right shoulder: Secondary | ICD-10-CM | POA: Diagnosis not present

## 2015-10-11 DIAGNOSIS — R51 Headache: Secondary | ICD-10-CM | POA: Diagnosis not present

## 2015-10-11 DIAGNOSIS — K2270 Barrett's esophagus without dysplasia: Secondary | ICD-10-CM | POA: Diagnosis not present

## 2015-11-01 DIAGNOSIS — M25551 Pain in right hip: Secondary | ICD-10-CM | POA: Diagnosis not present

## 2015-11-05 DIAGNOSIS — H35353 Cystoid macular degeneration, bilateral: Secondary | ICD-10-CM | POA: Diagnosis not present

## 2015-11-05 DIAGNOSIS — H5310 Unspecified subjective visual disturbances: Secondary | ICD-10-CM | POA: Diagnosis not present

## 2015-11-17 DIAGNOSIS — H35351 Cystoid macular degeneration, right eye: Secondary | ICD-10-CM | POA: Diagnosis not present

## 2015-11-17 DIAGNOSIS — H35073 Retinal telangiectasis, bilateral: Secondary | ICD-10-CM | POA: Diagnosis not present

## 2015-12-22 DIAGNOSIS — R0683 Snoring: Secondary | ICD-10-CM | POA: Diagnosis not present

## 2015-12-28 DIAGNOSIS — D2261 Melanocytic nevi of right upper limb, including shoulder: Secondary | ICD-10-CM | POA: Diagnosis not present

## 2015-12-28 DIAGNOSIS — Z85828 Personal history of other malignant neoplasm of skin: Secondary | ICD-10-CM | POA: Diagnosis not present

## 2015-12-28 DIAGNOSIS — L728 Other follicular cysts of the skin and subcutaneous tissue: Secondary | ICD-10-CM | POA: Diagnosis not present

## 2015-12-28 DIAGNOSIS — D225 Melanocytic nevi of trunk: Secondary | ICD-10-CM | POA: Diagnosis not present

## 2016-01-18 DIAGNOSIS — R319 Hematuria, unspecified: Secondary | ICD-10-CM | POA: Diagnosis not present

## 2016-01-18 DIAGNOSIS — I10 Essential (primary) hypertension: Secondary | ICD-10-CM | POA: Diagnosis not present

## 2016-01-18 DIAGNOSIS — R809 Proteinuria, unspecified: Secondary | ICD-10-CM | POA: Diagnosis not present

## 2016-01-18 DIAGNOSIS — N2581 Secondary hyperparathyroidism of renal origin: Secondary | ICD-10-CM | POA: Diagnosis not present

## 2016-01-18 DIAGNOSIS — N183 Chronic kidney disease, stage 3 (moderate): Secondary | ICD-10-CM | POA: Diagnosis not present

## 2016-01-24 DIAGNOSIS — R809 Proteinuria, unspecified: Secondary | ICD-10-CM | POA: Diagnosis not present

## 2016-01-24 DIAGNOSIS — N183 Chronic kidney disease, stage 3 (moderate): Secondary | ICD-10-CM | POA: Diagnosis not present

## 2016-01-24 DIAGNOSIS — I10 Essential (primary) hypertension: Secondary | ICD-10-CM | POA: Diagnosis not present

## 2016-02-16 DIAGNOSIS — H35073 Retinal telangiectasis, bilateral: Secondary | ICD-10-CM | POA: Diagnosis not present

## 2016-02-16 DIAGNOSIS — H35351 Cystoid macular degeneration, right eye: Secondary | ICD-10-CM | POA: Diagnosis not present

## 2016-02-16 DIAGNOSIS — H43811 Vitreous degeneration, right eye: Secondary | ICD-10-CM | POA: Diagnosis not present

## 2016-03-20 DIAGNOSIS — Z8781 Personal history of (healed) traumatic fracture: Secondary | ICD-10-CM | POA: Diagnosis not present

## 2016-06-21 DIAGNOSIS — F5104 Psychophysiologic insomnia: Secondary | ICD-10-CM | POA: Diagnosis not present

## 2016-07-11 DIAGNOSIS — K2270 Barrett's esophagus without dysplasia: Secondary | ICD-10-CM | POA: Diagnosis not present

## 2016-07-17 DIAGNOSIS — R001 Bradycardia, unspecified: Secondary | ICD-10-CM | POA: Insufficient documentation

## 2016-08-04 DIAGNOSIS — N182 Chronic kidney disease, stage 2 (mild): Secondary | ICD-10-CM | POA: Diagnosis not present

## 2016-08-04 DIAGNOSIS — Z0000 Encounter for general adult medical examination without abnormal findings: Secondary | ICD-10-CM | POA: Diagnosis not present

## 2016-08-11 DIAGNOSIS — K2270 Barrett's esophagus without dysplasia: Secondary | ICD-10-CM | POA: Diagnosis not present

## 2016-08-11 DIAGNOSIS — N182 Chronic kidney disease, stage 2 (mild): Secondary | ICD-10-CM | POA: Diagnosis not present

## 2016-08-11 DIAGNOSIS — R001 Bradycardia, unspecified: Secondary | ICD-10-CM | POA: Diagnosis not present

## 2016-08-11 DIAGNOSIS — M810 Age-related osteoporosis without current pathological fracture: Secondary | ICD-10-CM | POA: Diagnosis not present

## 2016-08-11 DIAGNOSIS — Z0000 Encounter for general adult medical examination without abnormal findings: Secondary | ICD-10-CM | POA: Diagnosis not present

## 2016-08-15 DIAGNOSIS — R001 Bradycardia, unspecified: Secondary | ICD-10-CM | POA: Diagnosis not present

## 2016-08-16 DIAGNOSIS — R001 Bradycardia, unspecified: Secondary | ICD-10-CM | POA: Diagnosis not present

## 2016-08-22 DIAGNOSIS — R001 Bradycardia, unspecified: Secondary | ICD-10-CM | POA: Diagnosis not present

## 2016-08-23 DIAGNOSIS — M545 Low back pain: Secondary | ICD-10-CM | POA: Diagnosis not present

## 2016-08-28 DIAGNOSIS — D2261 Melanocytic nevi of right upper limb, including shoulder: Secondary | ICD-10-CM | POA: Diagnosis not present

## 2016-08-28 DIAGNOSIS — L570 Actinic keratosis: Secondary | ICD-10-CM | POA: Diagnosis not present

## 2016-08-28 DIAGNOSIS — D225 Melanocytic nevi of trunk: Secondary | ICD-10-CM | POA: Diagnosis not present

## 2016-08-28 DIAGNOSIS — D2272 Melanocytic nevi of left lower limb, including hip: Secondary | ICD-10-CM | POA: Diagnosis not present

## 2016-08-28 DIAGNOSIS — X32XXXA Exposure to sunlight, initial encounter: Secondary | ICD-10-CM | POA: Diagnosis not present

## 2016-08-28 DIAGNOSIS — Z85828 Personal history of other malignant neoplasm of skin: Secondary | ICD-10-CM | POA: Diagnosis not present

## 2016-08-29 DIAGNOSIS — R001 Bradycardia, unspecified: Secondary | ICD-10-CM | POA: Diagnosis not present

## 2016-09-01 DIAGNOSIS — M545 Low back pain: Secondary | ICD-10-CM | POA: Diagnosis not present

## 2016-09-01 DIAGNOSIS — S39012S Strain of muscle, fascia and tendon of lower back, sequela: Secondary | ICD-10-CM | POA: Diagnosis not present

## 2016-09-11 DIAGNOSIS — M545 Low back pain: Secondary | ICD-10-CM | POA: Diagnosis not present

## 2016-09-11 DIAGNOSIS — S39012S Strain of muscle, fascia and tendon of lower back, sequela: Secondary | ICD-10-CM | POA: Diagnosis not present

## 2016-09-12 DIAGNOSIS — H5310 Unspecified subjective visual disturbances: Secondary | ICD-10-CM | POA: Diagnosis not present

## 2016-09-12 DIAGNOSIS — H40013 Open angle with borderline findings, low risk, bilateral: Secondary | ICD-10-CM | POA: Diagnosis not present

## 2016-09-15 DIAGNOSIS — S39012S Strain of muscle, fascia and tendon of lower back, sequela: Secondary | ICD-10-CM | POA: Diagnosis not present

## 2016-09-15 DIAGNOSIS — M545 Low back pain: Secondary | ICD-10-CM | POA: Diagnosis not present

## 2016-09-22 DIAGNOSIS — S39012S Strain of muscle, fascia and tendon of lower back, sequela: Secondary | ICD-10-CM | POA: Diagnosis not present

## 2016-09-22 DIAGNOSIS — M545 Low back pain: Secondary | ICD-10-CM | POA: Diagnosis not present

## 2016-10-02 ENCOUNTER — Encounter: Payer: Self-pay | Admitting: *Deleted

## 2016-10-03 ENCOUNTER — Encounter: Payer: Self-pay | Admitting: *Deleted

## 2016-10-03 ENCOUNTER — Ambulatory Visit: Payer: Medicare Other | Admitting: Anesthesiology

## 2016-10-03 ENCOUNTER — Encounter
Admission: RE | Disposition: A | Discharge status: Home / Self Care | Payer: Self-pay | Source: Ambulatory Visit | Attending: Gastroenterology

## 2016-10-03 ENCOUNTER — Ambulatory Visit
Admission: RE | Admit: 2016-10-03 | Discharge: 2016-10-03 | Disposition: A | Discharge status: Home / Self Care | Payer: Medicare Other | Source: Ambulatory Visit | Attending: Gastroenterology | Admitting: Gastroenterology

## 2016-10-03 DIAGNOSIS — K317 Polyp of stomach and duodenum: Secondary | ICD-10-CM | POA: Diagnosis not present

## 2016-10-03 DIAGNOSIS — N189 Chronic kidney disease, unspecified: Secondary | ICD-10-CM | POA: Insufficient documentation

## 2016-10-03 DIAGNOSIS — Z87891 Personal history of nicotine dependence: Secondary | ICD-10-CM | POA: Diagnosis not present

## 2016-10-03 DIAGNOSIS — K2950 Unspecified chronic gastritis without bleeding: Secondary | ICD-10-CM | POA: Diagnosis not present

## 2016-10-03 DIAGNOSIS — K2970 Gastritis, unspecified, without bleeding: Secondary | ICD-10-CM | POA: Insufficient documentation

## 2016-10-03 DIAGNOSIS — M810 Age-related osteoporosis without current pathological fracture: Secondary | ICD-10-CM | POA: Insufficient documentation

## 2016-10-03 DIAGNOSIS — K449 Diaphragmatic hernia without obstruction or gangrene: Secondary | ICD-10-CM | POA: Diagnosis not present

## 2016-10-03 DIAGNOSIS — K210 Gastro-esophageal reflux disease with esophagitis: Secondary | ICD-10-CM | POA: Diagnosis not present

## 2016-10-03 DIAGNOSIS — K2960 Other gastritis without bleeding: Secondary | ICD-10-CM | POA: Diagnosis not present

## 2016-10-03 DIAGNOSIS — K2270 Barrett's esophagus without dysplasia: Secondary | ICD-10-CM | POA: Insufficient documentation

## 2016-10-03 DIAGNOSIS — M8580 Other specified disorders of bone density and structure, unspecified site: Secondary | ICD-10-CM | POA: Insufficient documentation

## 2016-10-03 HISTORY — DX: Unspecified fracture of unspecified femur, initial encounter for closed fracture: S72.90XA

## 2016-10-03 HISTORY — PX: ESOPHAGOGASTRODUODENOSCOPY (EGD) WITH PROPOFOL: SHX5813

## 2016-10-03 HISTORY — DX: Age-related osteoporosis without current pathological fracture: M81.0

## 2016-10-03 HISTORY — DX: Other specified disorders of bone density and structure, unspecified site: M85.80

## 2016-10-03 HISTORY — DX: Barrett's esophagus without dysplasia: K22.70

## 2016-10-03 HISTORY — DX: Hematuria, unspecified: R31.9

## 2016-10-03 HISTORY — DX: Chronic kidney disease, unspecified: N18.9

## 2016-10-03 SURGERY — ESOPHAGOGASTRODUODENOSCOPY (EGD) WITH PROPOFOL
Anesthesia: General

## 2016-10-03 MED ORDER — SODIUM CHLORIDE 0.9 % IV SOLN: INTRAVENOUS | Status: DC

## 2016-10-03 MED ORDER — MIDAZOLAM HCL 2 MG/2ML IJ SOLN
INTRAMUSCULAR | Status: DC | PRN
  Administered 2016-10-03: 1 mg via INTRAVENOUS

## 2016-10-03 MED ORDER — PROPOFOL 500 MG/50ML IV EMUL
INTRAVENOUS | Status: DC | PRN
  Administered 2016-10-03: 120 ug/kg/min via INTRAVENOUS

## 2016-10-03 MED ORDER — LIDOCAINE HCL (CARDIAC) 20 MG/ML IV SOLN
INTRAVENOUS | Status: DC | PRN
  Administered 2016-10-03: 30 mg via INTRAVENOUS

## 2016-10-03 MED ORDER — FENTANYL CITRATE (PF) 100 MCG/2ML IJ SOLN
INTRAMUSCULAR | Status: DC | PRN
  Administered 2016-10-03: 50 ug via INTRAVENOUS

## 2016-10-03 MED ORDER — SODIUM CHLORIDE 0.9 % IV SOLN
INTRAVENOUS | Status: DC
  Administered 2016-10-03: 12:00:00 via INTRAVENOUS

## 2016-10-03 NOTE — Transfer of Care (Signed)
Immediate Anesthesia Transfer of Care Note  Patient: [name] [name]  Procedure(s) Performed: Procedure(s): ESOPHAGOGASTRODUODENOSCOPY (EGD) WITH PROPOFOL (N/A)  Patient Location: PACU  Anesthesia Type:General  Level of Consciousness: awake and sedated  Airway & Oxygen Therapy: Patient Spontanous Breathing and Patient connected to nasal cannula oxygen  Post-op Assessment: Report given to RN and Post -op Vital signs reviewed and stable  Post vital signs: Reviewed and stable  Last Vitals:  Vitals:   10/03/16 1239  BP: (!) 147/63  Pulse: 72  Resp: 18  Temp: (!) 35.8 C    Last Pain:  Vitals:   10/03/16 1239  TempSrc: Tympanic         Complications: No apparent anesthesia complications

## 2016-10-03 NOTE — Anesthesia Procedure Notes (Signed)
Performed by: COOK-MARTIN, Ashden Sonnenberg Pre-anesthesia Checklist: Patient identified, Emergency Drugs available, Suction available, Patient being monitored and Timeout performed Patient Re-evaluated:Patient Re-evaluated prior to inductionOxygen Delivery Method: Nasal cannula Preoxygenation: Pre-oxygenation with 100% oxygen Intubation Type: IV induction Airway Equipment and Method: Bite block Placement Confirmation: positive ETCO2 and CO2 detector     

## 2016-10-03 NOTE — Op Note (Signed)
[facility] Gastroenterology Patient Name: Lucas Winograd Procedure Date: 10/03/2016 1:28 PM MRN: [id number] Account #: [id number] Date of Birth: [birth year] Admit Type: Outpatient Age: 75 Room: [facility] ENDO ROOM 3 Gender: Male Note Status: Finalized Procedure:            Upper GI endoscopy Indications:          Follow-up of Barrett's esophagus Providers:            Lollie Sails, MD Referring MD:         Adrian Prows (Referring MD) Medicines:            Monitored Anesthesia Care Complications:        No immediate complications. Procedure:            Pre-Anesthesia Assessment:                       - ASA Grade Assessment: II - A patient with mild                        systemic disease.                       After obtaining informed consent, the endoscope was                        passed under direct vision. Throughout the procedure,                        the patient's blood pressure, pulse, and oxygen                        saturations were monitored continuously. The Endoscope                        was introduced through the mouth, and advanced to the                        third part of duodenum. The upper GI endoscopy was                        accomplished without difficulty. The patient tolerated                        the procedure well. Findings:      The Z-line was regular. Biopsies were taken with a cold forceps for       histology. Biopsies were taken in a quadrant fashion for evaluation of       Barretts essophagus.      The exam of the esophagus was otherwise normal.      A small sliding hiatal hernia was present.      Patchy minimal inflammation characterized by congestion (edema) and       erythema was found in the gastric body and in the gastric antrum.       Biopsies were taken with a cold forceps for histology.      The cardia and gastric fundus were normal on retroflexion otherwise.      The examined duodenum was normal.      A few 1 to  3 mm pedunculated and sessile polyps with no bleeding and no       stigmata of recent  bleeding were found in the gastric body. Biopsies       were taken with a cold forceps for histology. Impression:           - Z-line regular. Biopsied.                       - Small hiatal hernia.                       - Gastritis. Biopsied.                       - Normal examined duodenum. Recommendation:       - Discharge patient to home.                       - Await pathology results.                       - Continue present medications.                       - Telephone GI clinic for pathology results in 1 week. Procedure Code(s):    --- Professional ---                       [phone], Esophagogastroduodenoscopy, flexible, transoral;                        with biopsy, single or multiple Diagnosis Code(s):    --- Professional ---                       K22.70, Barrett's esophagus without dysplasia                       K44.9, Diaphragmatic hernia without obstruction or                        gangrene                       K29.70, Gastritis, unspecified, without bleeding CPT copyright 2016 American Medical Association. All rights reserved. The codes documented in this report are preliminary and upon coder review may  be revised to meet current compliance requirements. Lollie Sails, MD 10/03/2016 1:57:49 PM This report has been signed electronically. Number of Addenda: 0 Note Initiated On: 10/03/2016 1:28 PM      [facility]

## 2016-10-03 NOTE — H&P (Signed)
Outpatient short stay form Pre-procedure 10/03/2016 1:20 PM Lollie Sails MD  Primary Physician: Dr. Adrian Prows Reason for visit:  Follow-up Barrett's esophagus, EGD  History of present illness:  Patient is a 75 year old male presenting today as above. He does take omeprazole daily he has no heartburn symptoms or dysphagia. He takes no aspirin products or blood thinning agents.    Current Facility-Administered Medications:  .  0.9 %  sodium chloride infusion, , Intravenous, Continuous, Lollie Sails, MD  Prescriptions Prior to Admission  Medication Sig Dispense Refill Last Dose  . calcium carbonate (OSCAL) 1500 (600 Ca) MG TABS tablet Take by mouth 2 (two) times daily with a meal.   10/02/2016 at Unknown time  . omeprazole (PRILOSEC) 20 MG capsule Take 20 mg by mouth daily.   10/02/2016 at Unknown time  . traZODone (DESYREL) 50 MG tablet Take 50 mg by mouth at bedtime.   Not Taking at Unknown time     Allergies  Allergen Reactions  . Demerol [Meperidine]      Past Medical History:  Diagnosis Date  . Age related osteoporosis   . Barrett's esophagus   . CKD (chronic kidney disease)   . Femur fracture (HCC)    Right  . Hematuria   . Osteopenia of the elderly     Review of systems:      Physical Exam    Heart and lungs: Regular rate and rhythm without rub or gallop, lungs are bilaterally clear.    HEENT: Normocephalic atraumatic eyes are anicteric    Other:     Pertinant exam for procedure: Soft nontender nondistended bowel sounds positive normoactive.    Planned proceedures: EGD and indicated procedures. I have discussed the risks benefits and complications of procedures to include not limited to bleeding, infection, perforation and the risk of sedation and the patient wishes to proceed.    Lollie Sails, MD Gastroenterology 10/03/2016  1:20 PM

## 2016-10-03 NOTE — Anesthesia Preprocedure Evaluation (Signed)
Anesthesia Evaluation  Patient identified by MRN, date of birth, ID band Patient awake    Reviewed: Allergy & Precautions, NPO status , Patient's Chart, lab work & pertinent test results  History of Anesthesia Complications Negative for: history of anesthetic complications  Airway Mallampati: II  TM Distance: >3 FB Neck ROM: Full    Dental no notable dental hx.    Pulmonary neg sleep apnea, neg COPD, former smoker,    breath sounds clear to auscultation- rhonchi (-) wheezing      Cardiovascular Exercise Tolerance: Good (-) hypertension(-) CAD and (-) Past MI  Rhythm:Regular Rate:Normal - Systolic murmurs and - Diastolic murmurs    Neuro/Psych negative neurological ROS  negative psych ROS   GI/Hepatic negative GI ROS, Neg liver ROS,   Endo/Other  negative endocrine ROSneg diabetes  Renal/GU CRFRenal disease     Musculoskeletal negative musculoskeletal ROS (+)   Abdominal (+) - obese,   Peds  Hematology negative hematology ROS (+)   Anesthesia Other Findings Past Medical History: No date: Age related osteoporosis No date: Barrett's esophagus No date: CKD (chronic kidney disease) No date: Hematuria No date: Osteopenia of the elderly   Reproductive/Obstetrics                             Anesthesia Physical Anesthesia Plan  ASA: II  Anesthesia Plan: General   Post-op Pain Management:    Induction: Intravenous  Airway Management Planned: Natural Airway  Additional Equipment:   Intra-op Plan:   Post-operative Plan:   Informed Consent: I have reviewed the patients History and Physical, chart, labs and discussed the procedure including the risks, benefits and alternatives for the proposed anesthesia with the patient or authorized representative who has indicated his/her understanding and acceptance.   Dental advisory given  Plan Discussed with: CRNA and  Anesthesiologist  Anesthesia Plan Comments:         Anesthesia Quick Evaluation

## 2016-10-03 NOTE — Anesthesia Postprocedure Evaluation (Signed)
Anesthesia Post Note  Patient: [name] [name]  Procedure(s) Performed: Procedure(s) (LRB): ESOPHAGOGASTRODUODENOSCOPY (EGD) WITH PROPOFOL (N/A)  Patient location during evaluation: Endoscopy Anesthesia Type: General Level of consciousness: awake and alert and oriented Pain management: pain level controlled Vital Signs Assessment: post-procedure vital signs reviewed and stable Respiratory status: spontaneous breathing, nonlabored ventilation and respiratory function stable Cardiovascular status: blood pressure returned to baseline and stable Postop Assessment: no signs of nausea or vomiting Anesthetic complications: no    Last Vitals:  Vitals:   10/03/16 1358 10/03/16 1435  BP: 105/65 137/67  Pulse: (!) 55   Resp: 16   Temp: (!) 35.8 C     Last Pain:  Vitals:   10/03/16 1358  TempSrc: Tympanic                 Cira Deyoe

## 2016-10-04 ENCOUNTER — Encounter: Payer: Self-pay | Admitting: Gastroenterology

## 2016-10-05 LAB — SURGICAL PATHOLOGY

## 2016-10-24 DIAGNOSIS — N183 Chronic kidney disease, stage 3 (moderate): Secondary | ICD-10-CM | POA: Diagnosis not present

## 2016-10-24 DIAGNOSIS — M810 Age-related osteoporosis without current pathological fracture: Secondary | ICD-10-CM | POA: Diagnosis not present

## 2017-01-22 DIAGNOSIS — N183 Chronic kidney disease, stage 3 (moderate): Secondary | ICD-10-CM | POA: Diagnosis not present

## 2017-01-22 DIAGNOSIS — I129 Hypertensive chronic kidney disease with stage 1 through stage 4 chronic kidney disease, or unspecified chronic kidney disease: Secondary | ICD-10-CM | POA: Diagnosis not present

## 2017-01-22 DIAGNOSIS — R809 Proteinuria, unspecified: Secondary | ICD-10-CM | POA: Diagnosis not present

## 2017-01-24 ENCOUNTER — Other Ambulatory Visit: Payer: Self-pay | Admitting: Gastroenterology

## 2017-01-24 DIAGNOSIS — R0989 Other specified symptoms and signs involving the circulatory and respiratory systems: Secondary | ICD-10-CM

## 2017-01-26 ENCOUNTER — Ambulatory Visit
Admission: RE | Admit: 2017-01-26 | Discharge: 2017-01-26 | Disposition: A | Discharge status: Home / Self Care | Payer: Medicare Other | Source: Ambulatory Visit | Attending: Gastroenterology | Admitting: Gastroenterology

## 2017-01-26 DIAGNOSIS — R0989 Other specified symptoms and signs involving the circulatory and respiratory systems: Secondary | ICD-10-CM | POA: Insufficient documentation

## 2017-01-26 DIAGNOSIS — K7689 Other specified diseases of liver: Secondary | ICD-10-CM | POA: Diagnosis not present

## 2017-01-26 DIAGNOSIS — N281 Cyst of kidney, acquired: Secondary | ICD-10-CM | POA: Insufficient documentation

## 2017-01-26 DIAGNOSIS — K8020 Calculus of gallbladder without cholecystitis without obstruction: Secondary | ICD-10-CM | POA: Diagnosis not present

## 2017-02-12 DIAGNOSIS — R58 Hemorrhage, not elsewhere classified: Secondary | ICD-10-CM | POA: Diagnosis not present

## 2017-02-12 DIAGNOSIS — L820 Inflamed seborrheic keratosis: Secondary | ICD-10-CM | POA: Diagnosis not present

## 2017-02-16 DIAGNOSIS — S20211A Contusion of right front wall of thorax, initial encounter: Secondary | ICD-10-CM | POA: Diagnosis not present

## 2017-02-16 DIAGNOSIS — S60519A Abrasion of unspecified hand, initial encounter: Secondary | ICD-10-CM | POA: Insufficient documentation

## 2017-02-16 DIAGNOSIS — S0181XA Laceration without foreign body of other part of head, initial encounter: Secondary | ICD-10-CM | POA: Diagnosis not present

## 2017-02-16 DIAGNOSIS — S60511A Abrasion of right hand, initial encounter: Secondary | ICD-10-CM | POA: Diagnosis not present

## 2017-02-17 DIAGNOSIS — S0181XA Laceration without foreign body of other part of head, initial encounter: Secondary | ICD-10-CM | POA: Insufficient documentation

## 2017-02-21 DIAGNOSIS — S20211A Contusion of right front wall of thorax, initial encounter: Secondary | ICD-10-CM | POA: Diagnosis not present

## 2017-02-21 DIAGNOSIS — S0181XA Laceration without foreign body of other part of head, initial encounter: Secondary | ICD-10-CM | POA: Diagnosis not present

## 2017-02-21 DIAGNOSIS — S60511A Abrasion of right hand, initial encounter: Secondary | ICD-10-CM | POA: Diagnosis not present

## 2017-04-05 DIAGNOSIS — F5101 Primary insomnia: Secondary | ICD-10-CM | POA: Diagnosis not present

## 2017-04-11 DIAGNOSIS — F5101 Primary insomnia: Secondary | ICD-10-CM | POA: Diagnosis not present

## 2017-04-11 DIAGNOSIS — G2581 Restless legs syndrome: Secondary | ICD-10-CM | POA: Diagnosis not present

## 2017-04-23 DIAGNOSIS — Z961 Presence of intraocular lens: Secondary | ICD-10-CM | POA: Diagnosis not present

## 2017-04-23 DIAGNOSIS — H26491 Other secondary cataract, right eye: Secondary | ICD-10-CM | POA: Diagnosis not present

## 2017-04-23 DIAGNOSIS — H40013 Open angle with borderline findings, low risk, bilateral: Secondary | ICD-10-CM | POA: Diagnosis not present

## 2017-04-23 DIAGNOSIS — H43812 Vitreous degeneration, left eye: Secondary | ICD-10-CM | POA: Diagnosis not present

## 2017-05-04 DIAGNOSIS — G4730 Sleep apnea, unspecified: Secondary | ICD-10-CM | POA: Insufficient documentation

## 2017-06-08 DIAGNOSIS — F5101 Primary insomnia: Secondary | ICD-10-CM | POA: Diagnosis not present

## 2017-06-08 DIAGNOSIS — G2581 Restless legs syndrome: Secondary | ICD-10-CM | POA: Diagnosis not present

## 2017-06-20 DIAGNOSIS — G2581 Restless legs syndrome: Secondary | ICD-10-CM | POA: Diagnosis not present

## 2017-06-20 DIAGNOSIS — F5101 Primary insomnia: Secondary | ICD-10-CM | POA: Diagnosis not present

## 2017-06-20 DIAGNOSIS — F411 Generalized anxiety disorder: Secondary | ICD-10-CM | POA: Diagnosis not present

## 2017-07-08 DIAGNOSIS — G4733 Obstructive sleep apnea (adult) (pediatric): Secondary | ICD-10-CM | POA: Diagnosis not present

## 2017-07-27 DIAGNOSIS — F411 Generalized anxiety disorder: Secondary | ICD-10-CM | POA: Diagnosis not present

## 2017-07-27 DIAGNOSIS — F5105 Insomnia due to other mental disorder: Secondary | ICD-10-CM | POA: Diagnosis not present

## 2017-08-07 DIAGNOSIS — N183 Chronic kidney disease, stage 3 (moderate): Secondary | ICD-10-CM | POA: Diagnosis not present

## 2017-08-07 DIAGNOSIS — M810 Age-related osteoporosis without current pathological fracture: Secondary | ICD-10-CM | POA: Diagnosis not present

## 2017-08-07 DIAGNOSIS — R001 Bradycardia, unspecified: Secondary | ICD-10-CM | POA: Diagnosis not present

## 2017-08-07 DIAGNOSIS — K2270 Barrett's esophagus without dysplasia: Secondary | ICD-10-CM | POA: Diagnosis not present

## 2017-08-07 DIAGNOSIS — Z0000 Encounter for general adult medical examination without abnormal findings: Secondary | ICD-10-CM | POA: Diagnosis not present

## 2017-08-07 DIAGNOSIS — N182 Chronic kidney disease, stage 2 (mild): Secondary | ICD-10-CM | POA: Diagnosis not present

## 2017-08-10 DIAGNOSIS — F5105 Insomnia due to other mental disorder: Secondary | ICD-10-CM | POA: Diagnosis not present

## 2017-08-10 DIAGNOSIS — F411 Generalized anxiety disorder: Secondary | ICD-10-CM | POA: Diagnosis not present

## 2017-08-13 DIAGNOSIS — M810 Age-related osteoporosis without current pathological fracture: Secondary | ICD-10-CM | POA: Diagnosis not present

## 2017-08-13 DIAGNOSIS — K2270 Barrett's esophagus without dysplasia: Secondary | ICD-10-CM | POA: Diagnosis not present

## 2017-08-13 DIAGNOSIS — Z0000 Encounter for general adult medical examination without abnormal findings: Secondary | ICD-10-CM | POA: Diagnosis not present

## 2017-08-13 DIAGNOSIS — N182 Chronic kidney disease, stage 2 (mild): Secondary | ICD-10-CM | POA: Diagnosis not present

## 2017-08-13 DIAGNOSIS — F5101 Primary insomnia: Secondary | ICD-10-CM | POA: Diagnosis not present

## 2017-08-28 DIAGNOSIS — L820 Inflamed seborrheic keratosis: Secondary | ICD-10-CM | POA: Diagnosis not present

## 2017-08-28 DIAGNOSIS — C44219 Basal cell carcinoma of skin of left ear and external auricular canal: Secondary | ICD-10-CM | POA: Diagnosis not present

## 2017-08-28 DIAGNOSIS — L570 Actinic keratosis: Secondary | ICD-10-CM | POA: Diagnosis not present

## 2017-08-28 DIAGNOSIS — C44519 Basal cell carcinoma of skin of other part of trunk: Secondary | ICD-10-CM | POA: Diagnosis not present

## 2017-08-28 DIAGNOSIS — X32XXXA Exposure to sunlight, initial encounter: Secondary | ICD-10-CM | POA: Diagnosis not present

## 2017-08-28 DIAGNOSIS — Z08 Encounter for follow-up examination after completed treatment for malignant neoplasm: Secondary | ICD-10-CM | POA: Diagnosis not present

## 2017-08-28 DIAGNOSIS — D485 Neoplasm of uncertain behavior of skin: Secondary | ICD-10-CM | POA: Diagnosis not present

## 2017-08-28 DIAGNOSIS — Z85828 Personal history of other malignant neoplasm of skin: Secondary | ICD-10-CM | POA: Diagnosis not present

## 2017-08-29 DIAGNOSIS — F411 Generalized anxiety disorder: Secondary | ICD-10-CM | POA: Diagnosis not present

## 2017-08-29 DIAGNOSIS — F5105 Insomnia due to other mental disorder: Secondary | ICD-10-CM | POA: Diagnosis not present

## 2017-09-12 DIAGNOSIS — F5105 Insomnia due to other mental disorder: Secondary | ICD-10-CM | POA: Diagnosis not present

## 2017-09-12 DIAGNOSIS — F411 Generalized anxiety disorder: Secondary | ICD-10-CM | POA: Diagnosis not present

## 2017-09-26 DIAGNOSIS — F5105 Insomnia due to other mental disorder: Secondary | ICD-10-CM | POA: Diagnosis not present

## 2017-09-26 DIAGNOSIS — F411 Generalized anxiety disorder: Secondary | ICD-10-CM | POA: Diagnosis not present

## 2017-10-10 DIAGNOSIS — F411 Generalized anxiety disorder: Secondary | ICD-10-CM | POA: Diagnosis not present

## 2017-10-10 DIAGNOSIS — F5105 Insomnia due to other mental disorder: Secondary | ICD-10-CM | POA: Diagnosis not present

## 2017-10-15 DIAGNOSIS — J343 Hypertrophy of nasal turbinates: Secondary | ICD-10-CM | POA: Diagnosis not present

## 2017-10-15 DIAGNOSIS — J3489 Other specified disorders of nose and nasal sinuses: Secondary | ICD-10-CM | POA: Diagnosis not present

## 2017-10-15 DIAGNOSIS — G4733 Obstructive sleep apnea (adult) (pediatric): Secondary | ICD-10-CM | POA: Diagnosis not present

## 2017-10-15 DIAGNOSIS — J300 Vasomotor rhinitis: Secondary | ICD-10-CM | POA: Diagnosis not present

## 2017-10-16 DIAGNOSIS — L905 Scar conditions and fibrosis of skin: Secondary | ICD-10-CM | POA: Diagnosis not present

## 2017-10-16 DIAGNOSIS — C44519 Basal cell carcinoma of skin of other part of trunk: Secondary | ICD-10-CM | POA: Diagnosis not present

## 2017-10-19 DIAGNOSIS — F5105 Insomnia due to other mental disorder: Secondary | ICD-10-CM | POA: Diagnosis not present

## 2017-10-19 DIAGNOSIS — F411 Generalized anxiety disorder: Secondary | ICD-10-CM | POA: Diagnosis not present

## 2017-10-24 DIAGNOSIS — G4739 Other sleep apnea: Secondary | ICD-10-CM | POA: Diagnosis not present

## 2017-10-24 DIAGNOSIS — F5101 Primary insomnia: Secondary | ICD-10-CM | POA: Diagnosis not present

## 2017-10-24 DIAGNOSIS — R001 Bradycardia, unspecified: Secondary | ICD-10-CM | POA: Diagnosis not present

## 2017-10-24 DIAGNOSIS — N182 Chronic kidney disease, stage 2 (mild): Secondary | ICD-10-CM | POA: Diagnosis not present

## 2017-10-30 DIAGNOSIS — C44219 Basal cell carcinoma of skin of left ear and external auricular canal: Secondary | ICD-10-CM | POA: Diagnosis not present

## 2017-11-06 DIAGNOSIS — F411 Generalized anxiety disorder: Secondary | ICD-10-CM | POA: Diagnosis not present

## 2017-11-06 DIAGNOSIS — F5105 Insomnia due to other mental disorder: Secondary | ICD-10-CM | POA: Diagnosis not present

## 2017-11-16 DIAGNOSIS — J300 Vasomotor rhinitis: Secondary | ICD-10-CM | POA: Diagnosis not present

## 2017-11-16 DIAGNOSIS — J3489 Other specified disorders of nose and nasal sinuses: Secondary | ICD-10-CM | POA: Diagnosis not present

## 2017-11-16 DIAGNOSIS — G4733 Obstructive sleep apnea (adult) (pediatric): Secondary | ICD-10-CM | POA: Diagnosis not present

## 2017-11-29 ENCOUNTER — Ambulatory Visit: Payer: Medicare Other | Attending: Otolaryngology

## 2017-11-29 DIAGNOSIS — F5101 Primary insomnia: Secondary | ICD-10-CM | POA: Diagnosis not present

## 2017-11-29 DIAGNOSIS — G4761 Periodic limb movement disorder: Secondary | ICD-10-CM | POA: Diagnosis not present

## 2017-11-29 DIAGNOSIS — G4733 Obstructive sleep apnea (adult) (pediatric): Secondary | ICD-10-CM | POA: Insufficient documentation

## 2018-03-28 DIAGNOSIS — L0390 Cellulitis, unspecified: Secondary | ICD-10-CM | POA: Insufficient documentation

## 2018-04-23 ENCOUNTER — Emergency Department: Payer: Medicare Other

## 2018-04-23 ENCOUNTER — Encounter: Payer: Self-pay | Admitting: Emergency Medicine

## 2018-04-23 ENCOUNTER — Emergency Department
Admission: EM | Admit: 2018-04-23 | Discharge: 2018-04-23 | Disposition: A | Discharge status: Home / Self Care | Payer: Medicare Other | Attending: Emergency Medicine | Admitting: Emergency Medicine

## 2018-04-23 ENCOUNTER — Other Ambulatory Visit: Payer: Self-pay

## 2018-04-23 DIAGNOSIS — R42 Dizziness and giddiness: Secondary | ICD-10-CM | POA: Diagnosis not present

## 2018-04-23 DIAGNOSIS — Z79899 Other long term (current) drug therapy: Secondary | ICD-10-CM | POA: Diagnosis not present

## 2018-04-23 DIAGNOSIS — Z87891 Personal history of nicotine dependence: Secondary | ICD-10-CM | POA: Diagnosis not present

## 2018-04-23 DIAGNOSIS — R531 Weakness: Secondary | ICD-10-CM | POA: Diagnosis not present

## 2018-04-23 DIAGNOSIS — N189 Chronic kidney disease, unspecified: Secondary | ICD-10-CM | POA: Insufficient documentation

## 2018-04-23 DIAGNOSIS — R002 Palpitations: Secondary | ICD-10-CM

## 2018-04-23 LAB — URINALYSIS, COMPLETE (UACMP) WITH MICROSCOPIC
BILIRUBIN URINE: NEGATIVE
Bacteria, UA: NONE SEEN
GLUCOSE, UA: NEGATIVE mg/dL
HGB URINE DIPSTICK: NEGATIVE
KETONES UR: NEGATIVE mg/dL
LEUKOCYTES UA: NEGATIVE
NITRITE: NEGATIVE
PH: 5 (ref 5.0–8.0)
Protein, ur: NEGATIVE mg/dL
Specific Gravity, Urine: 1.018 (ref 1.005–1.030)

## 2018-04-23 LAB — CBC
HEMATOCRIT: 46 % (ref 40.0–52.0)
HEMOGLOBIN: 15.3 g/dL (ref 13.0–18.0)
MCH: 29.4 pg (ref 26.0–34.0)
MCHC: 33.3 g/dL (ref 32.0–36.0)
MCV: 88.5 fL (ref 80.0–100.0)
Platelets: 208 10*3/uL (ref 150–440)
RBC: 5.2 MIL/uL (ref 4.40–5.90)
RDW: 14.2 % (ref 11.5–14.5)
WBC: 5.5 10*3/uL (ref 3.8–10.6)

## 2018-04-23 LAB — BASIC METABOLIC PANEL
ANION GAP: 4 — AB (ref 5–15)
BUN: 29 mg/dL — ABNORMAL HIGH (ref 6–20)
CO2: 28 mmol/L (ref 22–32)
Calcium: 8.9 mg/dL (ref 8.9–10.3)
Chloride: 108 mmol/L (ref 101–111)
Creatinine, Ser: 1.49 mg/dL — ABNORMAL HIGH (ref 0.61–1.24)
GFR calc Af Amer: 50 mL/min — ABNORMAL LOW (ref >60)
GFR, EST NON AFRICAN AMERICAN: 43 mL/min — AB (ref >60)
GLUCOSE: 103 mg/dL — AB (ref 65–99)
POTASSIUM: 4.4 mmol/L (ref 3.5–5.1)
Sodium: 140 mmol/L (ref 135–145)

## 2018-04-23 LAB — TROPONIN I: Troponin I: <0.03 ng/mL (ref <0.03)

## 2018-04-23 NOTE — ED Notes (Signed)
ED Provider at bedside. 

## 2018-04-23 NOTE — ED Provider Notes (Signed)
[facility] Emergency Department Provider Note  ____________________________________________   I have reviewed the triage vital signs and the nursing notes.   HISTORY  Chief Complaint Weakness   History limited by: Not Limited   HPI [name] [name] is a 77 y.o. male who presents to the emergency department today because of weakness and dizziness.  The patient states that these symptoms have been ongoing for a couple of weeks.  He states that they typically occur in the morning.  Feels some dizziness when he stands up and he feels what he describes as a fluttering in his chest.  Usually however it will go away.  Yesterday however it lasted throughout the day.  It again occurred this morning so he decided to come in.  He states he is fairly active and typically rode bikes.  He does have a resting heart rate he states in the mid 11s.  Denies any chest pain.  He denies any shortness of breath.  No diaphoresis.    Per medical record review patient has a history of CKD  Past Medical History:  Diagnosis Date  . Age related osteoporosis   . Barrett's esophagus   . CKD (chronic kidney disease)   . Femur fracture (HCC)    Right  . Hematuria   . Osteopenia of the elderly     There are no active problems to display for this patient.   Past Surgical History:  Procedure Laterality Date  . ESOPHAGOGASTRODUODENOSCOPY (EGD) WITH PROPOFOL N/A 10/03/2016   Procedure: ESOPHAGOGASTRODUODENOSCOPY (EGD) WITH PROPOFOL;  Surgeon: Lollie Sails, MD;  Location: [facility] ENDOSCOPY;  Service: Endoscopy;  Laterality: N/A;  . EYE SURGERY    . FRACTURE SURGERY Right 2016   femur    Prior to Admission medications   Medication Sig Start Date End Date Taking? Authorizing Provider  calcium carbonate (OSCAL) 1500 (600 Ca) MG TABS tablet Take by mouth 2 (two) times daily with a meal.    [provider]  omeprazole (PRILOSEC) 20 MG capsule Take 20 mg by mouth daily.     [provider]  traZODone (DESYREL) 50 MG tablet Take 50 mg by mouth at bedtime.    [provider]    Allergies Demerol [meperidine]  No family history on file.  Social History Social History   Tobacco Use  . Smoking status: Former Smoker    Last attempt to quit: 10/03/1974    Years since quitting: 43.5  . Smokeless tobacco: Never Used  Substance Use Topics  . Alcohol use: Yes    Alcohol/week: 0.6 oz    Types: 1 Cans of beer per week    Comment: 1 beer every 2 weeks  . Drug use: No    Review of Systems Constitutional: No fever/chills Eyes: No visual changes. ENT: No sore throat. Cardiovascular: Denies chest pain.  Positive for chest fluttering Respiratory: Denies shortness of breath. Gastrointestinal: No abdominal pain.  No nausea, no vomiting.  No diarrhea.   Genitourinary: Negative for dysuria. Musculoskeletal: Negative for back pain. Skin: Negative for rash. Neurological: Positive for lightheadedness  ____________________________________________   PHYSICAL EXAM:  VITAL SIGNS: ED Triage Vitals  Enc Vitals Group     BP 04/23/18 0944 (!) 129/51     Pulse Rate 04/23/18 0944 (!) 40     Resp 04/23/18 0944 18     Temp 04/23/18 0944 97.8 F (36.6 C)     Temp Source 04/23/18 0944 Oral     SpO2 04/23/18 0944 98 %  Weight 04/23/18 0944 160 lb (72.6 kg)     Height 04/23/18 0944 5\' 7"  (1.702 m)     Head Circumference --      Peak Flow --      Pain Score 04/23/18 0949 0    Constitutional: Alert and oriented. Well appearing and in no distress. Eyes: Conjunctivae are normal.  ENT   Head: Normocephalic and atraumatic.   Nose: No congestion/rhinnorhea.   Mouth/Throat: Mucous membranes are moist.   Neck: No stridor. Hematological/Lymphatic/Immunilogical: No cervical lymphadenopathy. Cardiovascular: Normal rate, regular rhythm.  No murmurs, rubs, or gallops.  Respiratory: Normal respiratory effort without tachypnea nor retractions.  Breath sounds are clear and equal bilaterally. No wheezes/rales/rhonchi. Gastrointestinal: Soft and non tender. No rebound. No guarding.  Genitourinary: Deferred Musculoskeletal: Normal range of motion in all extremities. No lower extremity edema. Neurologic:  Normal speech and language. No gross focal neurologic deficits are appreciated.  Skin:  Skin is warm, dry and intact. No rash noted. Psychiatric: Mood and affect are normal. Speech and behavior are normal. Patient exhibits appropriate insight and judgment.  ____________________________________________    LABS (pertinent positives/negatives)  CBC wnl Trop <0.03 UA not consistent with infection BMP na 140, k 4.4, glu 103, cr 1.49 ____________________________________________   EKG  I, Nance Pear, attending physician, personally viewed and interpreted this EKG  EKG Time: 0938 Rate: 40 Rhythm: sinus bradycardia with sinus arrythmia Axis: normal Intervals: qtc 350 QRS: narrow, q waves v1, v2 ST changes: no st elevation Impression: abnormal ekg  ____________________________________________    RADIOLOGY  CXR No acute disease   ____________________________________________   PROCEDURES  Procedures  ____________________________________________   INITIAL IMPRESSION / ASSESSMENT AND PLAN / ED COURSE  Pertinent labs & imaging results that were available during my care of the patient were reviewed by me and considered in my medical decision making (see chart for details).  Patient presented to the emergency department today because of concerns for lightheadedness and weakness in chest fluttering.  Differential would be broad including anemia electrolyte abnormality arrhythmia cardiac etiology amongst other etiologies.  Work-up without any overly concerning or obvious etiology.  Creatinine is slightly elevated over baseline.  Did discuss this with the patient.  Did offer IV fluids.  No abnormal heart rhythms while  patient was monitored here in the emergency department.  However given fluttering to discuss possibility of Holter monitor.  Discussed with the patient that he needs to see and follow-up with primary care physician.  Discussed return precautions.  ____________________________________________   FINAL CLINICAL IMPRESSION(S) / ED DIAGNOSES  Final diagnoses:  Dizziness  Palpitations     Note: This dictation was prepared with Dragon dictation. Any transcriptional errors that result from this process are unintentional     Nance Pear, MD 04/23/18 1436

## 2018-04-23 NOTE — ED Notes (Signed)
Pt in NAD at time of departure, pt ambulatory. Verbalizes d/c understanding and follow up

## 2018-04-23 NOTE — Discharge Instructions (Addendum)
Please speak to your physician about a Holter monitor to track your heart rhythm. Please seek medical attention for any high fevers, chest pain, shortness of breath, change in behavior, persistent vomiting, bloody stool or any other new or concerning symptoms.

## 2018-05-06 ENCOUNTER — Ambulatory Visit: Payer: Medicare Other | Admitting: Anesthesiology

## 2018-05-06 ENCOUNTER — Other Ambulatory Visit: Payer: Self-pay

## 2018-05-06 ENCOUNTER — Encounter
Admission: RE | Disposition: A | Discharge status: Home / Self Care | Payer: Self-pay | Source: Ambulatory Visit | Attending: Gastroenterology

## 2018-05-06 ENCOUNTER — Ambulatory Visit
Admission: RE | Admit: 2018-05-06 | Discharge: 2018-05-06 | Disposition: A | Discharge status: Home / Self Care | Payer: Medicare Other | Source: Ambulatory Visit | Attending: Gastroenterology | Admitting: Gastroenterology

## 2018-05-06 ENCOUNTER — Encounter: Payer: Self-pay | Admitting: *Deleted

## 2018-05-06 DIAGNOSIS — K219 Gastro-esophageal reflux disease without esophagitis: Secondary | ICD-10-CM | POA: Diagnosis not present

## 2018-05-06 DIAGNOSIS — Z87891 Personal history of nicotine dependence: Secondary | ICD-10-CM | POA: Diagnosis not present

## 2018-05-06 DIAGNOSIS — K648 Other hemorrhoids: Secondary | ICD-10-CM | POA: Diagnosis not present

## 2018-05-06 DIAGNOSIS — M810 Age-related osteoporosis without current pathological fracture: Secondary | ICD-10-CM | POA: Insufficient documentation

## 2018-05-06 DIAGNOSIS — D123 Benign neoplasm of transverse colon: Secondary | ICD-10-CM | POA: Insufficient documentation

## 2018-05-06 DIAGNOSIS — G4730 Sleep apnea, unspecified: Secondary | ICD-10-CM | POA: Diagnosis not present

## 2018-05-06 DIAGNOSIS — N189 Chronic kidney disease, unspecified: Secondary | ICD-10-CM | POA: Diagnosis not present

## 2018-05-06 DIAGNOSIS — M5136 Other intervertebral disc degeneration, lumbar region: Secondary | ICD-10-CM | POA: Diagnosis not present

## 2018-05-06 DIAGNOSIS — K5790 Diverticulosis of intestine, part unspecified, without perforation or abscess without bleeding: Secondary | ICD-10-CM | POA: Insufficient documentation

## 2018-05-06 DIAGNOSIS — Z885 Allergy status to narcotic agent status: Secondary | ICD-10-CM | POA: Insufficient documentation

## 2018-05-06 DIAGNOSIS — Z86010 Personal history of colonic polyps: Secondary | ICD-10-CM | POA: Diagnosis not present

## 2018-05-06 DIAGNOSIS — Z1211 Encounter for screening for malignant neoplasm of colon: Secondary | ICD-10-CM | POA: Diagnosis present

## 2018-05-06 HISTORY — DX: Other intervertebral disc degeneration, lumbar region: M51.36

## 2018-05-06 HISTORY — DX: Sleep apnea, unspecified: G47.30

## 2018-05-06 HISTORY — DX: Personal history of colonic polyps: Z86.010

## 2018-05-06 HISTORY — DX: Personal history of colon polyps, unspecified: Z86.0100

## 2018-05-06 HISTORY — DX: Unspecified cataract: H26.9

## 2018-05-06 HISTORY — DX: Other intervertebral disc degeneration, lumbar region without mention of lumbar back pain or lower extremity pain: M51.369

## 2018-05-06 HISTORY — DX: Gastro-esophageal reflux disease without esophagitis: K21.9

## 2018-05-06 HISTORY — PX: COLONOSCOPY WITH PROPOFOL: SHX5780

## 2018-05-06 SURGERY — COLONOSCOPY WITH PROPOFOL
Anesthesia: General

## 2018-05-06 MED ORDER — PROPOFOL 10 MG/ML IV BOLUS
INTRAVENOUS | Status: DC | PRN
  Administered 2018-05-06 (×2): 20 mg via INTRAVENOUS
  Administered 2018-05-06: 50 mg via INTRAVENOUS

## 2018-05-06 MED ORDER — SODIUM CHLORIDE 0.9 % IV SOLN
INTRAVENOUS | Status: DC
  Administered 2018-05-06: 07:00:00 via INTRAVENOUS

## 2018-05-06 MED ORDER — FENTANYL CITRATE (PF) 100 MCG/2ML IJ SOLN
INTRAMUSCULAR | Status: DC | PRN
  Administered 2018-05-06: 25 ug via INTRAVENOUS

## 2018-05-06 MED ORDER — EPHEDRINE SULFATE 50 MG/ML IJ SOLN
INTRAMUSCULAR | Status: AC
  Filled 2018-05-06: qty 1

## 2018-05-06 MED ORDER — PROPOFOL 10 MG/ML IV BOLUS
INTRAVENOUS | Status: AC
  Filled 2018-05-06: qty 20

## 2018-05-06 MED ORDER — PROPOFOL 500 MG/50ML IV EMUL
INTRAVENOUS | Status: DC | PRN
  Administered 2018-05-06: 180 ug/kg/min via INTRAVENOUS

## 2018-05-06 MED ORDER — FENTANYL CITRATE (PF) 100 MCG/2ML IJ SOLN
INTRAMUSCULAR | Status: AC
  Filled 2018-05-06: qty 2

## 2018-05-06 MED ORDER — SPOT INK MARKER SYRINGE KIT
PACK | SUBMUCOSAL | Status: DC | PRN
  Administered 2018-05-06: 5 mL via SUBMUCOSAL

## 2018-05-06 MED ORDER — MIDAZOLAM HCL 2 MG/2ML IJ SOLN
INTRAMUSCULAR | Status: AC
  Filled 2018-05-06: qty 2

## 2018-05-06 MED ORDER — SODIUM CHLORIDE 0.9 % IV SOLN
INTRAVENOUS | Status: DC
  Administered 2018-05-06: 08:00:00 via INTRAVENOUS

## 2018-05-06 MED ORDER — EPHEDRINE SULFATE 50 MG/ML IJ SOLN
INTRAMUSCULAR | Status: DC | PRN
  Administered 2018-05-06 (×2): 10 mg via INTRAVENOUS

## 2018-05-06 NOTE — Anesthesia Post-op Follow-up Note (Signed)
Anesthesia QCDR form completed.        

## 2018-05-06 NOTE — Anesthesia Preprocedure Evaluation (Signed)
Anesthesia Evaluation  Patient identified by MRN, date of birth, ID band Patient awake    Reviewed: Allergy & Precautions, H&P , NPO status , Patient's Chart, lab work & pertinent test results, reviewed documented beta blocker date and time   Airway Mallampati: II   Neck ROM: full    Dental  (+) Teeth Intact   Pulmonary neg pulmonary ROS, sleep apnea and Continuous Positive Airway Pressure Ventilation , former smoker,    Pulmonary exam normal        Cardiovascular Exercise Tolerance: Good negative cardio ROS Normal cardiovascular exam Rhythm:regular Rate:Normal     Neuro/Psych negative neurological ROS  negative psych ROS   GI/Hepatic negative GI ROS, Neg liver ROS, GERD  Medicated,  Endo/Other  negative endocrine ROS  Renal/GU CRFRenal diseasenegative Renal ROS  negative genitourinary   Musculoskeletal   Abdominal   Peds  Hematology negative hematology ROS (+)   Anesthesia Other Findings Past Medical History: No date: Age related osteoporosis No date: Barrett's esophagus No date: Cataract No date: CKD (chronic kidney disease) No date: DDD (degenerative disc disease), lumbar No date: Femur fracture (HCC)     Comment:  Right No date: GERD (gastroesophageal reflux disease) No date: Hematuria No date: History of colon polyps No date: Osteopenia of the elderly No date: Sleep apnea Past Surgical History: No date: BAND HEMORRHOIDECTOMY No date: COLONOSCOPY 10/03/2016: ESOPHAGOGASTRODUODENOSCOPY (EGD) WITH PROPOFOL; N/A     Comment:  Procedure: ESOPHAGOGASTRODUODENOSCOPY (EGD) WITH               PROPOFOL;  Surgeon: Lollie Sails, MD;  Location:               [facility] ENDOSCOPY;  Service: Endoscopy;  Laterality: N/A; No date: EYE SURGERY 2016: FRACTURE SURGERY; Right     Comment:  femur No date: ORIF DISTAL FEMUR FRACTURE No date: TONSILLECTOMY BMI    Body Mass Index:  25.06 kg/m     Reproductive/Obstetrics negative OB ROS                             Anesthesia Physical Anesthesia Plan  ASA: III  Anesthesia Plan: General   Post-op Pain Management:    Induction:   PONV Risk Score and Plan:   Airway Management Planned:   Additional Equipment:   Intra-op Plan:   Post-operative Plan:   Informed Consent: I have reviewed the patients History and Physical, chart, labs and discussed the procedure including the risks, benefits and alternatives for the proposed anesthesia with the patient or authorized representative who has indicated his/her understanding and acceptance.   Dental Advisory Given  Plan Discussed with: CRNA  Anesthesia Plan Comments:         Anesthesia Quick Evaluation

## 2018-05-06 NOTE — Anesthesia Procedure Notes (Signed)
Date/Time: 05/06/2018 8:15 AM Performed by: Allean Found, CRNA Pre-anesthesia Checklist: Patient identified, Emergency Drugs available, Suction available, Patient being monitored and Timeout performed Patient Re-evaluated:Patient Re-evaluated prior to induction Induction Type: IV induction

## 2018-05-06 NOTE — Anesthesia Postprocedure Evaluation (Signed)
Anesthesia Post Note  Patient: Braylee Bosher Branton  Procedure(s) Performed: COLONOSCOPY WITH PROPOFOL (N/A )  Patient location during evaluation: PACU Anesthesia Type: General Level of consciousness: awake and alert Pain management: pain level controlled Vital Signs Assessment: post-procedure vital signs reviewed and stable Respiratory status: spontaneous breathing, nonlabored ventilation, respiratory function stable and patient connected to nasal cannula oxygen Cardiovascular status: blood pressure returned to baseline and stable Postop Assessment: no apparent nausea or vomiting Anesthetic complications: no     Last Vitals:  Vitals:   05/06/18 1004 05/06/18 1014  BP: 107/66 (!) 121/59  Pulse: (!) 52 (!) 49  Resp: 11 19  Temp:    SpO2: 97% 99%    Last Pain:  Vitals:   05/06/18 1014  TempSrc:   PainSc: 0-No pain                 Molli Barrows

## 2018-05-06 NOTE — Op Note (Signed)
[facility] Gastroenterology Patient Name: [name] [name] Procedure Date: 05/06/2018 7:38 AM MRN: [id number] Account #: [id number] Date of Birth: November 20, 1941 Admit Type: Outpatient Age: 77 Room: [facility] ENDO ROOM 1 Gender: Male Note Status: Finalized Procedure:            Colonoscopy Indications:          Personal history of colonic polyps Providers:            Lollie Sails, MD Referring MD:         Baxter Hire, MD (Referring MD) Medicines:            Monitored Anesthesia Care Complications:        No immediate complications. Procedure:            Pre-Anesthesia Assessment:                       - ASA Grade Assessment: III - A patient with severe                        systemic disease.                       After obtaining informed consent, the colonoscope was                        passed under direct vision. Throughout the procedure,                        the patient's blood pressure, pulse, and oxygen                        saturations were monitored continuously. The                        Colonoscope was introduced through the anus and                        advanced to the the cecum, identified by appendiceal                        orifice and ileocecal valve. The colonoscopy was                        unusually difficult due to poor endoscopic                        visualization. Successful completion of the procedure                        was aided by changing the patient to a supine position,                        changing the patient to a prone position and using                        manual pressure. The patient tolerated the procedure                        well. The quality of the bowel preparation was good. Findings:      A 4 mm polyp was found  in the proximal transverse colon. The polyp was       sessile. The polyp was removed with a cold snare. Resection and       retrieval were complete.      A 25 mm polyp was found in the hepatic  flexure. The polyp was sessile,       difficult to approach due to the turn of the colon. Polypectomy was       attempted, initially using a saline injection-lift technique with a hot       snare. The lift was good, however the lesion very flat despite this.       Polyp resection was incomplete with this device. This intervention then       required a different device and polypectomy technique. The polyp was       removed with a cold snare and cold forceps in a peicemeal fashion.       Resection and retrieval were complete. Area was tattooed with an       injection of 4 mL of [place] ink.      A 1 mm polyp was found in the proximal transverse colon. The polyp was       sessile. The polyp was removed with a cold biopsy forceps. Resection and       retrieval were complete.      Multiple small-mouthed diverticula were found in the sigmoid colon and       distal descending colon.      Non-bleeding internal hemorrhoids were found during retroflexion and       during anoscopy. The hemorrhoids were small.      No additional abnormalities were found on retroflexion.      The digital rectal exam was normal. Impression:           - One 4 mm polyp in the proximal transverse colon,                        removed with a cold snare. Resected and retrieved.                       - One 25 mm polyp at the hepatic flexure, removed with                        a cold snare. Resected and retrieved. Tattooed.                       - One 1 mm polyp in the proximal transverse colon,                        removed with a cold biopsy forceps. Resected and                        retrieved.                       - Diverticulosis in the sigmoid colon and in the distal                        descending colon.                       - Non-bleeding internal hemorrhoids. Recommendation:       - Discharge patient to home.                       -  Full liquid diet today.                       - Full liquid diet for 2 days,  then advance as                        tolerated to low residue diet for 3 days. Procedure Code(s):    --- Professional ---                       [phone], Colonoscopy, flexible; with removal of tumor(s),                        polyp(s), or other lesion(s) by snare technique                       [id number], Colonoscopy, flexible; with directed submucosal                        injection(s), any substance                       [id number], 59, Colonoscopy, flexible; with biopsy, single                        or multiple CPT copyright 2017 American Medical Association. All rights reserved. The codes documented in this report are preliminary and upon coder review may  be revised to meet current compliance requirements. Lollie Sails, MD 05/06/2018 9:43:56 AM This report has been signed electronically. Number of Addenda: 0 Note Initiated On: 05/06/2018 7:38 AM Scope Withdrawal Time: 1 hour 31 minutes 22 seconds  Total Procedure Duration: 1 hour 49 minutes 49 seconds       [facility]

## 2018-05-06 NOTE — H&P (Signed)
Outpatient short stay form Pre-procedure 05/06/2018 7:31 AM Lollie Sails MD  Primary Physician: Dr. Adrian Prows  Reason for visit: Colonoscopy  History of present illness: Personal history of adenomatous colon polyps    Current Facility-Administered Medications:  .  0.9 %  sodium chloride infusion, , Intravenous, Continuous, Lollie Sails, MD .  0.9 %  sodium chloride infusion, , Intravenous, Continuous, Lollie Sails, MD, Last Rate: 20 mL/hr at 05/06/18 8828  Medications Prior to Admission  Medication Sig Dispense Refill Last Dose  . calcium carbonate (OSCAL) 1500 (600 Ca) MG TABS tablet Take 600 mg of elemental calcium by mouth daily with breakfast.    Past Week at Unknown time  . ipratropium (ATROVENT) 0.03 % nasal spray Place 1 spray into both nostrils 2 (two) times daily.   Past Week at Unknown time  . Multiple Vitamin (MULTIVITAMIN) tablet Take 1 tablet by mouth 2 (two) times daily.   Past Week at Unknown time  . omeprazole (PRILOSEC) 20 MG capsule Take 20 mg by mouth daily.   05/05/2018 at 0630  . traZODone (DESYREL) 50 MG tablet Take 50 mg by mouth at bedtime.   05/05/2018 at 2200  . citalopram (CELEXA) 20 MG tablet Take 20 mg by mouth daily.   Not Taking at Unknown time  . rOPINIRole (REQUIP) 0.25 MG tablet Take 0.25 mg by mouth at bedtime as needed (restless legs).    Not Taking at Unknown time     Allergies  Allergen Reactions  . Demerol [Meperidine] Nausea And Vomiting and Other (See Comments)    Patient passed out.     Past Medical History:  Diagnosis Date  . Age related osteoporosis   . Barrett's esophagus   . Cataract   . CKD (chronic kidney disease)   . DDD (degenerative disc disease), lumbar   . Femur fracture (HCC)    Right  . GERD (gastroesophageal reflux disease)   . Hematuria   . History of colon polyps   . Osteopenia of the elderly   . Sleep apnea     Review of systems:      Physical Exam    Heart and lungs: Regular rate and  rhythm without rub or gallop, lungs are bilaterally clear.    HEENT: Normocephalic atraumatic eyes are anicteric    Other:    Pertinant exam for procedure: Soft nontender nondistended bowel sounds positive normoactive    Planned proceedures: Colonoscopy and indicated procedures. I have discussed the risks benefits and complications of procedures to include not limited to bleeding, infection, perforation and the risk of sedation and the patient wishes to proceed.    Lollie Sails, MD Gastroenterology 05/06/2018  7:31 AM

## 2018-05-06 NOTE — Transfer of Care (Signed)
Immediate Anesthesia Transfer of Care Note  Patient: Armistead Sult Broadwater  Procedure(s) Performed: COLONOSCOPY WITH PROPOFOL (N/A )  Patient Location: PACU  Anesthesia Type:General  Level of Consciousness: sedated  Airway & Oxygen Therapy: Patient Spontanous Breathing and Patient connected to nasal cannula oxygen  Post-op Assessment: Report given to RN and Post -op Vital signs reviewed and stable  Post vital signs: Reviewed and stable  Last Vitals:  Vitals Value Taken Time  BP 111/61 05/06/2018  9:44 AM  Temp 35.8 C 05/06/2018  9:44 AM  Pulse 52 05/06/2018  9:46 AM  Resp 11 05/06/2018  9:46 AM  SpO2 96 % 05/06/2018  9:46 AM  Vitals shown include unvalidated device data.  Last Pain:  Vitals:   05/06/18 0944  TempSrc: Tympanic  PainSc: 0-No pain         Complications: No apparent anesthesia complications

## 2018-05-08 LAB — SURGICAL PATHOLOGY

## 2018-05-09 ENCOUNTER — Encounter: Payer: Self-pay | Admitting: Gastroenterology

## 2018-09-23 ENCOUNTER — Encounter: Payer: Self-pay | Admitting: *Deleted

## 2018-09-23 ENCOUNTER — Encounter
Admission: RE | Disposition: A | Discharge status: Home / Self Care | Payer: Self-pay | Source: Ambulatory Visit | Attending: Gastroenterology

## 2018-09-23 ENCOUNTER — Ambulatory Visit: Payer: Medicare Other | Admitting: Anesthesiology

## 2018-09-23 ENCOUNTER — Ambulatory Visit
Admission: RE | Admit: 2018-09-23 | Discharge: 2018-09-23 | Disposition: A | Discharge status: Home / Self Care | Payer: Medicare Other | Source: Ambulatory Visit | Attending: Gastroenterology | Admitting: Gastroenterology

## 2018-09-23 DIAGNOSIS — Z86010 Personal history of colonic polyps: Secondary | ICD-10-CM | POA: Diagnosis present

## 2018-09-23 DIAGNOSIS — Z885 Allergy status to narcotic agent status: Secondary | ICD-10-CM | POA: Insufficient documentation

## 2018-09-23 DIAGNOSIS — K640 First degree hemorrhoids: Secondary | ICD-10-CM | POA: Insufficient documentation

## 2018-09-23 DIAGNOSIS — G4730 Sleep apnea, unspecified: Secondary | ICD-10-CM | POA: Insufficient documentation

## 2018-09-23 DIAGNOSIS — K633 Ulcer of intestine: Secondary | ICD-10-CM | POA: Insufficient documentation

## 2018-09-23 DIAGNOSIS — M810 Age-related osteoporosis without current pathological fracture: Secondary | ICD-10-CM | POA: Diagnosis not present

## 2018-09-23 DIAGNOSIS — K219 Gastro-esophageal reflux disease without esophagitis: Secondary | ICD-10-CM | POA: Diagnosis not present

## 2018-09-23 DIAGNOSIS — D128 Benign neoplasm of rectum: Secondary | ICD-10-CM | POA: Diagnosis not present

## 2018-09-23 DIAGNOSIS — D125 Benign neoplasm of sigmoid colon: Secondary | ICD-10-CM | POA: Diagnosis not present

## 2018-09-23 DIAGNOSIS — N189 Chronic kidney disease, unspecified: Secondary | ICD-10-CM | POA: Diagnosis not present

## 2018-09-23 DIAGNOSIS — M5136 Other intervertebral disc degeneration, lumbar region: Secondary | ICD-10-CM | POA: Insufficient documentation

## 2018-09-23 DIAGNOSIS — Z79899 Other long term (current) drug therapy: Secondary | ICD-10-CM | POA: Insufficient documentation

## 2018-09-23 DIAGNOSIS — K529 Noninfective gastroenteritis and colitis, unspecified: Secondary | ICD-10-CM | POA: Diagnosis not present

## 2018-09-23 HISTORY — PX: COLONOSCOPY WITH PROPOFOL: SHX5780

## 2018-09-23 SURGERY — COLONOSCOPY WITH PROPOFOL
Anesthesia: General

## 2018-09-23 MED ORDER — MIDAZOLAM HCL 5 MG/5ML IJ SOLN
INTRAMUSCULAR | Status: DC | PRN
  Administered 2018-09-23 (×2): 1 mg via INTRAVENOUS

## 2018-09-23 MED ORDER — EPHEDRINE SULFATE 50 MG/ML IJ SOLN
INTRAMUSCULAR | Status: AC
  Filled 2018-09-23: qty 1

## 2018-09-23 MED ORDER — LIDOCAINE HCL (PF) 2 % IJ SOLN
INTRAMUSCULAR | Status: AC
  Filled 2018-09-23: qty 10

## 2018-09-23 MED ORDER — FENTANYL CITRATE (PF) 100 MCG/2ML IJ SOLN
INTRAMUSCULAR | Status: DC | PRN
  Administered 2018-09-23 (×4): 25 ug via INTRAVENOUS

## 2018-09-23 MED ORDER — PROPOFOL 500 MG/50ML IV EMUL
INTRAVENOUS | Status: DC | PRN
  Administered 2018-09-23: 50 ug/kg/min via INTRAVENOUS

## 2018-09-23 MED ORDER — PHENYLEPHRINE HCL 10 MG/ML IJ SOLN
INTRAMUSCULAR | Status: AC
  Filled 2018-09-23: qty 1

## 2018-09-23 MED ORDER — SODIUM CHLORIDE 0.9 % IV SOLN
INTRAVENOUS | Status: DC
  Administered 2018-09-23: 1000 mL via INTRAVENOUS

## 2018-09-23 MED ORDER — LIDOCAINE HCL (PF) 2 % IJ SOLN
INTRAMUSCULAR | Status: DC | PRN
  Administered 2018-09-23: 70 mg

## 2018-09-23 MED ORDER — PROPOFOL 500 MG/50ML IV EMUL
INTRAVENOUS | Status: AC
  Filled 2018-09-23: qty 50

## 2018-09-23 MED ORDER — FENTANYL CITRATE (PF) 100 MCG/2ML IJ SOLN
INTRAMUSCULAR | Status: AC
  Filled 2018-09-23: qty 2

## 2018-09-23 MED ORDER — MIDAZOLAM HCL 2 MG/2ML IJ SOLN
INTRAMUSCULAR | Status: AC
  Filled 2018-09-23: qty 2

## 2018-09-23 MED ORDER — PROPOFOL 10 MG/ML IV BOLUS
INTRAVENOUS | Status: DC | PRN
  Administered 2018-09-23 (×2): 20 mg via INTRAVENOUS
  Administered 2018-09-23: 10 mg via INTRAVENOUS

## 2018-09-23 NOTE — Anesthesia Post-op Follow-up Note (Signed)
Anesthesia QCDR form completed.        

## 2018-09-23 NOTE — Transfer of Care (Signed)
Immediate Anesthesia Transfer of Care Note  Patient: [name] [name]  Procedure(s) Performed: COLONOSCOPY WITH PROPOFOL (N/A )  Patient Location: PACU  Anesthesia Type:General  Level of Consciousness: sedated  Airway & Oxygen Therapy: Patient Spontanous Breathing and Patient connected to nasal cannula oxygen  Post-op Assessment: Report given to RN and Post -op Vital signs reviewed and stable  Post vital signs: Reviewed and stable  Last Vitals:  Vitals Value Taken Time  BP    Temp    Pulse    Resp    SpO2      Last Pain:  Vitals:   09/23/18 0701  TempSrc: Tympanic         Complications: No apparent anesthesia complications

## 2018-09-23 NOTE — Op Note (Signed)
[facility] Gastroenterology Patient Name: [name] [name] Procedure Date: 09/23/2018 7:31 AM MRN: [id number] Account #: [id number] Date of Birth: 1941-08-03 Admit Type: Outpatient Age: 77 Room: [facility] ENDO ROOM 1 Gender: Male Note Status: Finalized Procedure:            Colonoscopy Indications:          Follow-up for history of colon polyps of uncertain                        behavior Providers:            Lollie Sails, MD Referring MD:         Baxter Hire, MD (Referring MD) Medicines:            Monitored Anesthesia Care Complications:        No immediate complications. Procedure:            Pre-Anesthesia Assessment:                       - ASA Grade Assessment: III - A patient with severe                        systemic disease.                       After obtaining informed consent, the colonoscope was                        passed under direct vision. Throughout the procedure,                        the patient's blood pressure, pulse, and oxygen                        saturations were monitored continuously. The                        Colonoscope was introduced through the anus and                        advanced to the the cecum, identified by the                        appendiceal orifice, ileocecal valve and palpation. The                        colonoscopy was performed without difficulty. The                        patient tolerated the procedure well. The quality of                        the bowel preparation was good. Findings:      A tattoo was seen at the hepatic flexure. The tattoo site appeared       atypical with a small mucosal ulceration adn an area of mildly nodular       mucosa. These areas were removed with multiple passes of a cold forcep.      A 1 mm polyp was found in the rectum. The polyp was sessile. The polyp       was removed  with a cold biopsy forceps. Resection and retrieval were       complete.      A 3 mm polyp was found  in the mid sigmoid colon. The polyp was sessile.       The polyp was removed with a hot snare. Resection and retrieval were       complete.      Non-bleeding internal hemorrhoids were found during retroflexion. The       hemorrhoids were small and Grade I (internal hemorrhoids that do not       prolapse).      No additional abnormalities were found on retroflexion. Impression:           - A tattoo was seen at the hepatic flexure. The tattoo                        site appeared abnormal.                       - One 1 mm polyp in the rectum, removed with a cold                        biopsy forceps. Resected and retrieved.                       - One 3 mm polyp in the mid sigmoid colon, removed with                        a hot snare. Resected and retrieved. Recommendation:       - Discharge patient to home.                       - Await pathology results.                       - Telephone GI clinic for pathology results in 1 week. Procedure Code(s):    --- Professional ---                       [phone], Colonoscopy, flexible; with removal of tumor(s),                        polyp(s), or other lesion(s) by snare technique                       45380, 75, Colonoscopy, flexible; with biopsy, single                        or multiple Diagnosis Code(s):    --- Professional ---                       K62.1, Rectal polyp                       D12.5, Benign neoplasm of sigmoid colon                       Z86.03, Personal history of neoplasm of uncertain                        behavior CPT copyright 2018 American Medical Association. All rights reserved. The codes  documented in this report are preliminary and upon coder review may  be revised to meet current compliance requirements. Lollie Sails, MD 09/23/2018 8:20:36 AM This report has been signed electronically. Number of Addenda: 0 Note Initiated On: 09/23/2018 7:31 AM Scope Withdrawal Time: 0 hours 15 minutes 33 seconds  Total  Procedure Duration: 0 hours 35 minutes 21 seconds       [facility]

## 2018-09-23 NOTE — Anesthesia Postprocedure Evaluation (Signed)
Anesthesia Post Note  Patient: [name] [name]  Procedure(s) Performed: COLONOSCOPY WITH PROPOFOL (N/A )  Patient location during evaluation: Endoscopy Anesthesia Type: General Level of consciousness: awake and alert Pain management: pain level controlled Vital Signs Assessment: post-procedure vital signs reviewed and stable Respiratory status: spontaneous breathing, nonlabored ventilation, respiratory function stable and patient connected to nasal cannula oxygen Cardiovascular status: blood pressure returned to baseline and stable Postop Assessment: no apparent nausea or vomiting Anesthetic complications: no     Last Vitals:  Vitals:   09/23/18 0836 09/23/18 0846  BP: 129/61 (!) 129/58  Pulse: (!) 52 (!) 52  Resp: 19 17  Temp:    SpO2: 100% 100%    Last Pain:  Vitals:   09/23/18 0846  TempSrc:   PainSc: 0-No pain                 Precious Haws Piscit[place]

## 2018-09-23 NOTE — H&P (Signed)
Outpatient short stay form Pre-procedure 09/23/2018 7:21 AM [name] Sails MD  Primary Physician: Harrel Lemon, MD  Reason for visit: Colonoscopy  History of present illness: Patient is a 77 year old male presenting today as above.  He has personal history of adenomatous colon polyps.  There was a large polyp removed from his hepatic flexure that had some atypical histology about 4 months or so ago.  He is returning today for recheck on the site.  Tolerated his prep well.  He takes no aspirin or blood thinning agent.    Current Facility-Administered Medications:  .  0.9 %  sodium chloride infusion, , Intravenous, Continuous, [name] Sails, MD  Medications Prior to Admission  Medication Sig Dispense Refill Last Dose  . ipratropium (ATROVENT) 0.03 % nasal spray Place 1 spray into both nostrils 2 (two) times daily.   09/22/2018 at Unknown time  . Multiple Vitamin (MULTIVITAMIN) tablet Take 1 tablet by mouth 2 (two) times daily.   09/22/2018 at Unknown time  . omeprazole (PRILOSEC) 20 MG capsule Take 20 mg by mouth daily.   09/22/2018 at Unknown time  . traZODone (DESYREL) 50 MG tablet Take 50 mg by mouth at bedtime.   09/22/2018 at Unknown time  . calcium carbonate (OSCAL) 1500 (600 Ca) MG TABS tablet Take 600 mg of elemental calcium by mouth daily with breakfast.    Not Taking at Unknown time  . citalopram (CELEXA) 20 MG tablet Take 20 mg by mouth daily.   Not Taking at Unknown time  . rOPINIRole (REQUIP) 0.25 MG tablet Take 0.25 mg by mouth at bedtime as needed (restless legs).    Not Taking at Unknown time     Allergies  Allergen Reactions  . Demerol [Meperidine] Nausea And Vomiting and Other (See Comments)    Patient passed out.     Past Medical History:  Diagnosis Date  . Age related osteoporosis   . Barrett's esophagus   . Cataract   . CKD (chronic kidney disease)   . DDD (degenerative disc disease), lumbar   . Femur fracture (HCC)    Right  . GERD  (gastroesophageal reflux disease)   . Hematuria   . History of colon polyps   . Osteopenia of the elderly   . Sleep apnea     Review of systems:      Physical Exam    Heart and lungs: Regular rate and rhythm without rub or gallop, lungs are bilaterally clear.    HEENT: Normal cephalic atraumatic eyes are anicteric    Other:    Pertinant exam for procedure: Soft nontender nondistended bowel sounds positive normoactive.    Planned proceedures: Colonoscopy and indicated procedures. I have discussed the risks benefits and complications of procedures to include not limited to bleeding, infection, perforation and the risk of sedation and the patient wishes to proceed.    [name] Sails, MD Gastroenterology 09/23/2018  7:21 AM

## 2018-09-23 NOTE — Anesthesia Preprocedure Evaluation (Signed)
Anesthesia Evaluation  Patient identified by MRN, date of birth, ID band Patient awake    Reviewed: Allergy & Precautions, H&P , NPO status , Patient's Chart, lab work & pertinent test results  History of Anesthesia Complications Negative for: history of anesthetic complications  Airway Mallampati: III  TM Distance: <3 FB Neck ROM: limited    Dental  (+) Chipped   Pulmonary sleep apnea and Continuous Positive Airway Pressure Ventilation , former smoker,           Cardiovascular Exercise Tolerance: Good (-) angina(-) Past MI and (-) DOE negative cardio ROS       Neuro/Psych negative neurological ROS  negative psych ROS   GI/Hepatic Neg liver ROS, GERD  Medicated and Controlled,  Endo/Other  negative endocrine ROS  Renal/GU CRFRenal disease  negative genitourinary   Musculoskeletal  (+) Arthritis ,   Abdominal   Peds  Hematology negative hematology ROS (+)   Anesthesia Other Findings Past Medical History: No date: Age related osteoporosis No date: Barrett's esophagus No date: Cataract No date: CKD (chronic kidney disease) No date: DDD (degenerative disc disease), lumbar No date: Femur fracture (HCC)     Comment:  Right No date: GERD (gastroesophageal reflux disease) No date: Hematuria No date: History of colon polyps No date: Osteopenia of the elderly No date: Sleep apnea  Past Surgical History: No date: BAND HEMORRHOIDECTOMY No date: COLONOSCOPY 05/06/2018: COLONOSCOPY WITH PROPOFOL; N/A     Comment:  Procedure: COLONOSCOPY WITH PROPOFOL;  Surgeon:               Lollie Sails, MD;  Location: [facility] ENDOSCOPY;                Service: Endoscopy;  Laterality: N/A; 10/03/2016: ESOPHAGOGASTRODUODENOSCOPY (EGD) WITH PROPOFOL; N/A     Comment:  Procedure: ESOPHAGOGASTRODUODENOSCOPY (EGD) WITH               PROPOFOL;  Surgeon: Lollie Sails, MD;  Location:               [facility] ENDOSCOPY;  Service:  Endoscopy;  Laterality: N/A; No date: EYE SURGERY 2016: FRACTURE SURGERY; Right     Comment:  femur No date: ORIF DISTAL FEMUR FRACTURE No date: TONSILLECTOMY  BMI    Body Mass Index:  23.96 kg/m      Reproductive/Obstetrics negative OB ROS                             Anesthesia Physical Anesthesia Plan  ASA: III  Anesthesia Plan: General   Post-op Pain Management:    Induction: Intravenous  PONV Risk Score and Plan: Propofol infusion and TIVA  Airway Management Planned: Natural Airway and Nasal Cannula  Additional Equipment:   Intra-op Plan:   Post-operative Plan:   Informed Consent: I have reviewed the patients History and Physical, chart, labs and discussed the procedure including the risks, benefits and alternatives for the proposed anesthesia with the patient or authorized representative who has indicated his/her understanding and acceptance.   Dental Advisory Given  Plan Discussed with: Anesthesiologist, CRNA and Surgeon  Anesthesia Plan Comments: (Patient consented for risks of anesthesia including but not limited to:  - adverse reactions to medications - risk of intubation if required - damage to teeth, lips or other oral mucosa - sore throat or hoarseness - Damage to heart, brain, lungs or loss of life  Patient voiced understanding.)  Anesthesia Quick Evaluation  

## 2018-09-24 ENCOUNTER — Encounter: Payer: Self-pay | Admitting: Gastroenterology

## 2018-09-24 LAB — SURGICAL PATHOLOGY

## 2018-12-29 NOTE — Progress Notes (Signed)
12/30/2018  11:01 AM   [name] [name] Oct 21, 1941 [id number]  Referring provider: Baxter Hire, MD [place], [place] [address]  Chief Complaint  Patient presents with  . Elevated PSA    HPI: [name] [name] is a 78 y.o. male with an elevated PSA who presents today to establish care.  He has a history of CKD.  His elevated PSA was originally noticed during insurance-related labs which found 29.  Repeat PSA 11/15/2018 was 30.53.  No prior PSA results.  Patient denies history of and current urologic problems.    No h/o RUTI  Denies dysuria, gross hematuria or flank/abdominal/pelvic/scrotal pain.   PMH: Past Medical History:  Diagnosis Date  . Age related osteoporosis   . Barrett's esophagus   . Cataract   . CKD (chronic kidney disease)   . DDD (degenerative disc disease), lumbar   . Femur fracture (HCC)    Right  . GERD (gastroesophageal reflux disease)   . Hematuria   . History of colon polyps   . Osteopenia of the elderly   . Sleep apnea     Surgical History: Past Surgical History:  Procedure Laterality Date  . BAND HEMORRHOIDECTOMY    . COLONOSCOPY    . COLONOSCOPY WITH PROPOFOL N/A 05/06/2018   Procedure: COLONOSCOPY WITH PROPOFOL;  Surgeon: Lollie Sails, MD;  Location: [facility] ENDOSCOPY;  Service: Endoscopy;  Laterality: N/A;  . COLONOSCOPY WITH PROPOFOL N/A 09/23/2018   Procedure: COLONOSCOPY WITH PROPOFOL;  Surgeon: Lollie Sails, MD;  Location: [facility] ENDOSCOPY;  Service: Endoscopy;  Laterality: N/A;  . ESOPHAGOGASTRODUODENOSCOPY (EGD) WITH PROPOFOL N/A 10/03/2016   Procedure: ESOPHAGOGASTRODUODENOSCOPY (EGD) WITH PROPOFOL;  Surgeon: Lollie Sails, MD;  Location: [facility] ENDOSCOPY;  Service: Endoscopy;  Laterality: N/A;  . EYE SURGERY    . FRACTURE SURGERY Right 2016   femur  . ORIF DISTAL FEMUR FRACTURE    . TONSILLECTOMY      Home Medications:  Allergies as of 12/30/2018      Reactions   Demerol [meperidine] Nausea And  Vomiting, Other (See Comments)   Patient passed out.      Medication List       Accurate as of December 30, 2018 11:01 AM. Always use your most recent med list.        Influenza vac split quadrivalent PF 0.5 ML injection Commonly known as:  FLUZONE HIGH-DOSE Fluzone High-Dose 2017-2018 (PF) 180 mcg/0.5 mL intramuscular syringe   ipratropium 0.03 % nasal spray Commonly known as:  ATROVENT Place 1 spray into both nostrils 2 (two) times daily.   multivitamin tablet Take 1 tablet by mouth 2 (two) times daily.   omeprazole 20 MG capsule Commonly known as:  PRILOSEC Take 20 mg by mouth daily.   PNEUMOVAX 23 25 MCG/0.5ML injection Generic drug:  pneumococcal 23 valent vaccine Pneumovax 23 25 mcg/0.5 mL injection syringe   traZODone 50 MG tablet Commonly known as:  DESYREL Take 50 mg by mouth at bedtime.       Allergies:  Allergies  Allergen Reactions  . Demerol [Meperidine] Nausea And Vomiting and Other (See Comments)    Patient passed out.    Family History: No family history on file.  Social History:  reports that he quit smoking about 44 years ago. His smoking use included cigarettes. He has never used smokeless tobacco. He reports previous alcohol use of about 1.0 standard drinks of alcohol per week. He reports that he does not use drugs.  ROS: UROLOGY  Frequent Urination?: No Hard to postpone urination?: No Burning/pain with urination?: No Get up at night to urinate?: No Leakage of urine?: No Urine stream starts and stops?: No Trouble starting stream?: No Do you have to strain to urinate?: No Blood in urine?: No Urinary tract infection?: No Sexually transmitted disease?: No Injury to kidneys or bladder?: No Painful intercourse?: No Weak stream?: No Erection problems?: No Penile pain?: No  Gastrointestinal Nausea?: No Vomiting?: No Indigestion/heartburn?: No Diarrhea?: No Constipation?: No  Constitutional Fever: No Night sweats?: No Weight  loss?: No Fatigue?: No  Skin Skin rash/lesions?: No Itching?: No  Eyes Blurred vision?: No Double vision?: No  Ears/Nose/Throat Sore throat?: No Sinus problems?: No  Hematologic/Lymphatic Swollen glands?: No Easy bruising?: No  Cardiovascular Leg swelling?: No Chest pain?: No  Respiratory Cough?: No Shortness of breath?: No  Endocrine Excessive thirst?: No  Musculoskeletal Back pain?: No Joint pain?: No  Neurological Headaches?: No Dizziness?: No  Psychologic Depression?: No Anxiety?: No  Physical Exam: BP 138/62 (BP Location: Left Arm, Patient Position: Sitting, Cuff Size: Normal)   Pulse (!) 43   Ht 5\' 8"  (1.727 m)   Wt 160 lb (72.6 kg)   BMI 24.33 kg/m   Constitutional:  Well nourished. Alert and oriented, No acute distress. Cardiovascular: No clubbing, cyanosis, or edema. Respiratory: Normal respiratory effort, no increased work of breathing. Rectal: Patient with normal sphincter tone. Anus and perineum without scarring or rashes. No rectal masses are appreciated. Prostate is approximately 50 grams, left side firm. Seminal vesicles are normal. Skin: No rashes, bruises or suspicious lesions. Neurologic: Grossly intact, no focal deficits, moving all 4 extremities. Psychiatric: Normal mood and affect.  Laboratory Data:  PSA 11/15/2018 30.53  Assessment & Plan:    1. Elevated PSA - Suspicious for prostate cancer - Urinalysis ordered. - Biopsy of prostate recommended if urinalysis is negative; patient is agreeable.  Discussed procedure, risks and benefits, and potential complications.   Abbie Sons, [place] [address], [place], [place] [address] [phone]  I, Adele Schilder, am acting as a scribe for John Giovanni, MD.    I, Abbie Sons, MD, have reviewed all documentation for this visit. The documentation on 12/30/18 for the exam, diagnosis, procedures, and orders are all  accurate and complete.

## 2018-12-30 ENCOUNTER — Encounter: Payer: Self-pay | Admitting: Urology

## 2018-12-30 ENCOUNTER — Ambulatory Visit (INDEPENDENT_AMBULATORY_CARE_PROVIDER_SITE_OTHER): Payer: Medicare Other | Admitting: Urology

## 2018-12-30 VITALS — BP 138/62 | HR 43 | Ht 68.0 in | Wt 160.0 lb

## 2018-12-30 DIAGNOSIS — R972 Elevated prostate specific antigen [PSA]: Secondary | ICD-10-CM

## 2018-12-30 DIAGNOSIS — D369 Benign neoplasm, unspecified site: Secondary | ICD-10-CM | POA: Insufficient documentation

## 2018-12-30 DIAGNOSIS — S7291XA Unspecified fracture of right femur, initial encounter for closed fracture: Secondary | ICD-10-CM | POA: Insufficient documentation

## 2018-12-30 DIAGNOSIS — N289 Disorder of kidney and ureter, unspecified: Secondary | ICD-10-CM | POA: Insufficient documentation

## 2018-12-30 LAB — URINALYSIS, COMPLETE
BILIRUBIN UA: NEGATIVE
GLUCOSE, UA: NEGATIVE
Ketones, UA: NEGATIVE
LEUKOCYTES UA: NEGATIVE
Nitrite, UA: NEGATIVE
PH UA: 5.5 (ref 5.0–7.5)
RBC, UA: NEGATIVE
Specific Gravity, UA: 1.025 (ref 1.005–1.030)
UUROB: 0.2 mg/dL (ref 0.2–1.0)

## 2018-12-30 LAB — MICROSCOPIC EXAMINATION
Epithelial Cells (non renal): NONE SEEN /hpf (ref 0–10)
RBC, UA: NONE SEEN /hpf (ref 0–2)

## 2018-12-30 NOTE — Patient Instructions (Addendum)
  Transrectal Prostate Biopsy Patient Education and Post Procedure Instructions    -Definition A prostate biopsy is the removal of a small amount of tissue from the prostate gland. The tissue is examined to determine whether there is cancer.  -Reasons for Procedure A prostate biopsy is usually done after an abnormal finding by: Digital rectal exam Prostate specific antigen (PSA) blood test A prostate biopsy is the only way to find out if cancer cells are present.  -Possible Complications Problems from the procedure are rare, but all procedures have some risk including: Infection Bruising or lengthy bleeding from the rectum, or in urine or semen Difficulty urinating Reactions to anesthesia Factors that may increase the risk of complications include: Smoking History of bleeding disorders or easy bruising Use of any medications, over-the-counter medications, or herbal supplements Sensitivity or allergy to latex, medications, or anesthesia.  -Prior to Procedure Talk to your doctor about your medications. Blood thinning medications including aspirin should be stopped 1 week prior to procedure. If prescribed by your cardiologist we may need approval before stopping medications. Use a Fleets enema 2 hours before the procedure. Can be purchased at your pharmacy. Antibiotics will be administered in the clinic prior to procedure.  Please make sure you eat a light meal prior to coming in for your appointment. This can help prevent lightheadedness during the procedure and upset stomach from antibiotics. Please bring someone with you to the procedure to drive you home.  -Anesthesia Transrectal biopsy: Local anesthesia--Just the area that is being operated on is numbed using an injectable anesthetic.  -Description of the Procedure Transrectal biopsy--Your doctor will insert a small ultrasound device into the rectum. This device will produce sound waves to create an image of the prostate.  These images will help guide placement of the needle. Your doctor will then insert the needle through the wall of the rectum and into the prostate gland. The procedure should take approximately 15-30 minutes.  -Will It Hurt? You may have discomfort and soreness at the biopsy site. Pain and discomfort after the procedure can be managed with medications.  -Postoperative Care When you return home after the procedure, do the following to help ensure a smooth recovery: Stay hydrated. Drink plenty of fluids for the next few days. Avoid difficult physical activity the day and evening of the procedure. Keep in mind that you may see blood in your urine, stool, or semen for several days. Resume any medications that were stopped when you are advised to do so.  After the sample is taken, it will be sent to a pathologist for examination under a microscope. This doctor will analyze the sample for cancer. You will be scheduled for an appointment to discuss results. If cancer is present, your doctor will work with you to develop a treatment plan.   -Call Your Doctor or Seek Immediate Medical Attention It is important to monitor your recovery. Alert your doctor to any problems. If any of the following occur, call your doctor or go to the emergency room: Fever 100.5 or greater within 1 week post procedure go directly to ER Call the office for: Blood in the urine more than 1 week or in semen for more than 6 weeks post-biopsy Pain that you cannot control with the medications you have been given Pain, burning, urgency, or frequency of urination Cough, shortness of breath, or chest pain- if severe go to ER Heavy rectal bleeding or bleeding that lasts more than 1 week after the biopsy If you   have any questions or concerns please contact our office at ([facility] [address], [place], [place] [address] [phone]      Transrectal Ultrasound-Guided  Prostate Biopsy, Care After This sheet gives you information about how to care for yourself after your procedure. Your doctor may also give you more specific instructions. If you have problems or questions, contact your doctor. What can I expect after the procedure? After the procedure, it is common to have:  Pain and discomfort in your butt, especially while sitting.  Pink-colored pee (urine), due to small amounts of blood in the pee.  Burning while peeing (urinating).  Blood in your poop (stool).  Bleeding from your butt.  Blood in your semen. Follow these instructions at home: Medicines  Take over-the-counter and prescription medicines only as told by your doctor.  If you were prescribed antibiotic medicine, take it as told by your doctor. Do not stop taking the antibiotic even if you start to feel better. Activity   Do not drive for 24 hours if you were given a medicine to help you relax (sedative) during your procedure.  Return to your normal activities as told by your doctor. Ask your doctor what activities are safe for you.  Ask your doctor when it is okay for you to have sex.  Do not lift anything that is heavier than 10 lb (4.5 kg), or the limit that you are told, until your doctor says that it is safe. General instructions   Drink enough water to keep your pee pale yellow.  Watch your pee, poop, and semen for new bleeding or bleeding that gets worse.  Keep all follow-up visits as told by your doctor. This is important. Contact a doctor if you:  Have blood clots in your pee or poop.  Notice that your pee smells bad or unusual.  Have very bad belly pain.  Have trouble peeing.  Notice that your lower belly feels firm.  Have blood in your pee for more than 2 weeks after the procedure.  Have blood in your semen for more than 2 months after the procedure.  Have problems getting an erection.  Feel sick to your stomach (nauseous).  Throw up  (vomit).  Have new or worse bleeding in your pee, poop, or semen. Get help right away if you:  Have a fever or chills.  Have bright red pee.  Have very bad pain that does not get better with medicine.  Cannot pee. Summary  After this procedure, it is common to have pain and discomfort around your butt, especially while sitting.  You may have blood in your pee and poop.  It is common to have blood in your semen for 1-2 months.  If you were prescribed antibiotic medicine, take it as told by your doctor. Do not stop taking the antibiotic even if you start to feel better.  Get help right away if you have a fever or chills. This information is not intended to replace advice given to you by your health care provider. Make sure you discuss any questions you have with your health care provider. Document Released: 09/18/2017 Document Revised: 09/18/2017 Document Reviewed: 09/18/2017 Elsevier Interactive Patient Education  2019 [employer].  Prostate Cancer Screening  The prostate is a walnut-sized gland that is located below the bladder and in front of the rectum in males. The function of the prostate (prostate gland) is to add fluid to semen during ejaculation. Prostate cancer is the  second most common type of cancer in men. A screening test for cancer is a test that is done before cancer symptoms start. Screening can help to identify cancer at an early stage, when the cancer can be treated more easily. The recommended prostate cancer screening test is a blood test called the prostate-specific antigen (PSA) test. PSA is a protein that is made in the prostate. As you age, your prostate naturally produces more PSA. Abnormally high PSA levels may be caused by:  Prostate cancer.  An enlarged prostate that is not caused by cancer (benign prostatic hyperplasia, BPH). This condition is very common in older men.  A prostate gland infection (prostatitis).  Medicines to assist with hair growth,  such as finasteride. Depending on the PSA results, you may need more tests, such as:  A physical exam to check the size of your prostate gland.  Blood and imaging tests.  A procedure to remove tissue samples from your prostate gland for testing (biopsy). Who should have screening? Screening recommendations vary based on age.  If you are younger than age 48, screening is not recommended.  If you are age 4-54 and you have no risk factors, screening is not recommended.  If you are younger than age 21, ask your health care provider if you need screening if you have one of these risk factors: ? Being of African-American descent. ? Having a family history of prostate cancer.  If you are age 10-69, talk with your health care provider about your need for screening and how often screening should be done.  If you are older than age 3, screening is not recommended. This is because the risks that screening can cause are greater than the benefits that it may provide (risks outweigh the benefits). If you are at high risk for prostate cancer, your health care provider may recommend that you have screenings more often or start screening at a younger age. You may be at high risk if you:  Are older than age 20.  Are African-American.  Have a father, brother, or uncle who has been diagnosed with prostate cancer. The risk may be higher if your family member's cancer occurred at an early age. What are the benefits of screening? There is a small chance that screening may lower your risk of dying from prostate cancer. The chance is small because prostate cancer is typically a slow-growing cancer, and most men with prostate cancer die from a different cause. What are the risks of screening? The main risk of prostate cancer screening is diagnosing and treating prostate cancer that would never have caused any symptoms or problems (overdiagnosis and overtreatment). PSA screening cannot tell you if your PSA is  high due to cancer or a different cause. A prostate biopsy is the only procedure to diagnose prostate cancer. Even the results of a biopsy may not tell you if your cancer needs to be treated. Slow-growing prostate cancer may not need any treatment other than monitoring, so diagnosing and treating it may cause unnecessary stress or other side effects. A prostate biopsy may also cause:  Infection or fever.  A false negative. This is a result that shows that you do not have prostate cancer when you actually do have prostate cancer. Questions to ask your health care provider  When should I start prostate cancer screening?  What is my risk for prostate cancer?  How often do I need screening?  What type of screening tests do I need?  How do I  get my test results?  What do my results mean?  Do I need treatment? Contact a health care provider if:  You have difficulty urinating.  You have pain when you urinate or ejaculate.  You have blood in your urine or semen.  You have pain in your back or in the area of your prostate.  You have trouble getting or maintaining an erection (erectile dysfunction, ED). Summary  Prostate cancer is a common type of cancer in men. The prostate (prostate gland) is located below the bladder and in front of the rectum. This gland adds fluid to semen during ejaculation.  Prostate cancer screening may identify cancer at an early stage, when the cancer can be treated more easily.  The prostate-specific antigen (PSA) test is the recommended screening test for prostate cancer.  Discuss the risks and benefits of prostate cancer screening with your health care provider. If you are age 57 or older, screening is likely to lead to more risks than benefits (risks outweigh the benefits). This information is not intended to replace advice given to you by your health care provider. Make sure you discuss any questions you have with your health care provider. Document  Released: 08/31/2017 Document Revised: 08/31/2017 Document Reviewed: 08/31/2017 Elsevier Interactive Patient Education  2019 [employer].

## 2019-01-17 ENCOUNTER — Other Ambulatory Visit: Payer: Self-pay | Admitting: Urology

## 2019-01-17 ENCOUNTER — Ambulatory Visit (INDEPENDENT_AMBULATORY_CARE_PROVIDER_SITE_OTHER): Payer: Medicare Other | Admitting: Urology

## 2019-01-17 ENCOUNTER — Encounter: Payer: Self-pay | Admitting: Urology

## 2019-01-17 VITALS — BP 148/59 | HR 55 | Ht 68.0 in | Wt 162.6 lb

## 2019-01-17 DIAGNOSIS — R972 Elevated prostate specific antigen [PSA]: Secondary | ICD-10-CM | POA: Diagnosis not present

## 2019-01-17 DIAGNOSIS — R3989 Other symptoms and signs involving the genitourinary system: Secondary | ICD-10-CM | POA: Insufficient documentation

## 2019-01-17 MED ORDER — LEVOFLOXACIN 500 MG PO TABS
500.0000 mg | ORAL_TABLET | Freq: Once | ORAL | Status: AC
  Administered 2019-01-17: 500 mg via ORAL

## 2019-01-17 MED ORDER — GENTAMICIN SULFATE 40 MG/ML IJ SOLN
80.0000 mg | Freq: Once | INTRAMUSCULAR | Status: AC
  Administered 2019-01-17: 80 mg via INTRAMUSCULAR

## 2019-01-17 NOTE — Progress Notes (Signed)
Prostate Biopsy Procedure   Informed consent was obtained after discussing risks/benefits of the procedure.  A time out was performed to ensure correct patient identity.  Pre-Procedure: - Last PSA Level: 30.53 December 2019 - DRE: Firm left prostate - Gentamicin given prophylactically - Levaquin 500 mg administered PO -Transrectal Ultrasound performed revealing a 37 gm prostate -Left PZ hypoechoic; no median lobe noted  Procedure: - Prostate block performed using 10 cc 1% lidocaine and biopsies taken from sextant areas, a total of 12 under ultrasound guidance.  Post-Procedure: - Patient tolerated the procedure well - He was counseled to seek immediate medical attention if experiences any severe pain, significant bleeding, or fevers - Return in one week to discuss biopsy results   John Giovanni, MD

## 2019-01-23 ENCOUNTER — Other Ambulatory Visit: Payer: Self-pay | Admitting: Urology

## 2019-01-23 LAB — PATHOLOGY REPORT

## 2019-01-31 ENCOUNTER — Encounter: Payer: Self-pay | Admitting: Urology

## 2019-01-31 ENCOUNTER — Ambulatory Visit (INDEPENDENT_AMBULATORY_CARE_PROVIDER_SITE_OTHER): Payer: Medicare Other | Admitting: Urology

## 2019-01-31 VITALS — BP 133/51 | HR 53 | Ht 68.0 in | Wt 161.4 lb

## 2019-01-31 DIAGNOSIS — C61 Malignant neoplasm of prostate: Secondary | ICD-10-CM | POA: Diagnosis not present

## 2019-01-31 NOTE — Progress Notes (Signed)
01/31/2019 12:36 PM   Teran Knittle Kugel Aug 14, 1941 [id number]  Referring provider: Baxter Hire, MD [place], [place] [address]  Chief Complaint  Patient presents with  . Follow-up    HPI: 78 year old male presents for prostate biopsy results.  He had no post biopsy complaints or problems.  -PSA 29 on insurance physical; repeat 30.53 -Firm left prostate on DRE -TRUS 37 g prostate; hypoechoic left PZ -Standard 12 core biopsy performed     PMH: Past Medical History:  Diagnosis Date  . Age related osteoporosis   . Barrett's esophagus   . Cataract   . CKD (chronic kidney disease)   . DDD (degenerative disc disease), lumbar   . Femur fracture (HCC)    Right  . GERD (gastroesophageal reflux disease)   . Hematuria   . History of colon polyps   . Osteopenia of the elderly   . Sleep apnea     Surgical History: Past Surgical History:  Procedure Laterality Date  . BAND HEMORRHOIDECTOMY    . COLONOSCOPY    . COLONOSCOPY WITH PROPOFOL N/A 05/06/2018   Procedure: COLONOSCOPY WITH PROPOFOL;  Surgeon: Lollie Sails, MD;  Location: [facility] ENDOSCOPY;  Service: Endoscopy;  Laterality: N/A;  . COLONOSCOPY WITH PROPOFOL N/A 09/23/2018   Procedure: COLONOSCOPY WITH PROPOFOL;  Surgeon: Lollie Sails, MD;  Location: [facility] ENDOSCOPY;  Service: Endoscopy;  Laterality: N/A;  . ESOPHAGOGASTRODUODENOSCOPY (EGD) WITH PROPOFOL N/A 10/03/2016   Procedure: ESOPHAGOGASTRODUODENOSCOPY (EGD) WITH PROPOFOL;  Surgeon: Lollie Sails, MD;  Location: [facility] ENDOSCOPY;  Service: Endoscopy;  Laterality: N/A;  . EYE SURGERY    . FRACTURE SURGERY Right 2016   femur  . ORIF DISTAL FEMUR FRACTURE    . TONSILLECTOMY      Home Medications:  Allergies as of 01/31/2019      Reactions   Demerol [meperidine] Nausea And Vomiting, Other (See Comments)   Patient passed out.      Medication List       Accurate as of January 31, 2019 12:36 PM. Always use your most recent med  list.        FLUAD 0.5 ML Susy Generic drug:  Influenza Vac A&B Surf Ant Adj ADM 0.5ML IM UTD   Influenza vac split quadrivalent PF 0.5 ML injection Commonly known as:  FLUZONE HIGH-DOSE Fluzone High-Dose 2017-2018 (PF) 180 mcg/0.5 mL intramuscular syringe   ipratropium 0.03 % nasal spray Commonly known as:  ATROVENT Place 1 spray into both nostrils 2 (two) times daily.   multivitamin tablet Take 1 tablet by mouth 2 (two) times daily.   omeprazole 20 MG capsule Commonly known as:  PRILOSEC Take 20 mg by mouth daily.   PNEUMOVAX 23 25 MCG/0.5ML injection Generic drug:  pneumococcal 23 valent vaccine Pneumovax 23 25 mcg/0.5 mL injection syringe   traZODone 50 MG tablet Commonly known as:  DESYREL Take 50 mg by mouth at bedtime.       Allergies:  Allergies  Allergen Reactions  . Demerol [Meperidine] Nausea And Vomiting and Other (See Comments)    Patient passed out.    Family History: History reviewed. No pertinent family history.  Social History:  reports that he quit smoking about 44 years ago. His smoking use included cigarettes. He has never used smokeless tobacco. He reports previous alcohol use of about 1.0 standard drinks of alcohol per week. He reports that he does not use drugs.  ROS: UROLOGY Frequent Urination?: No Hard to postpone urination?: No Burning/pain with urination?:  No Get up at night to urinate?: No Leakage of urine?: No Urine stream starts and stops?: No Trouble starting stream?: No Do you have to strain to urinate?: No Blood in urine?: No Urinary tract infection?: No Sexually transmitted disease?: No Injury to kidneys or bladder?: No Painful intercourse?: No Weak stream?: No Erection problems?: No Penile pain?: No  Gastrointestinal Nausea?: No Vomiting?: No Indigestion/heartburn?: No Diarrhea?: No Constipation?: No  Constitutional Fever: No Night sweats?: No Weight loss?: No Fatigue?: No  Skin Skin rash/lesions?:  No Itching?: No  Eyes Blurred vision?: No Double vision?: No  Ears/Nose/Throat Sore throat?: No Sinus problems?: No  Hematologic/Lymphatic Swollen glands?: No Easy bruising?: No  Cardiovascular Leg swelling?: No Chest pain?: No  Respiratory Cough?: No Shortness of breath?: No  Endocrine Excessive thirst?: No  Musculoskeletal Back pain?: No Joint pain?: No  Neurological Headaches?: No Dizziness?: No  Psychologic Depression?: No Anxiety?: No  Physical Exam: BP (!) 133/51 (BP Location: Left Arm, Patient Position: Sitting, Cuff Size: Normal)   Pulse (!) 53   Ht 5\' 8"  (1.727 m)   Wt 161 lb 6.4 oz (73.2 kg)   BMI 24.54 kg/m   Constitutional:  Alert and oriented, No acute distress. Cardiovascular: No clubbing, cyanosis, or edema. Respiratory: Normal respiratory effort, no increased work of breathing. Psychiatric: Normal mood and affect.    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 Willmann and his wife. - Will proceed with radiographic staging with bone scan and CT abdomen/pelvis - If no evidence of distant metastatic disease discussed radiation plus ADT.  He would be interested in pursuing and will refer to radiation oncology if bone scan negative  Greater than 50% of this 15-minute visit was spent counseling the patient.   Abbie Sons, [place] [address] [place], [place] [address] [phone]

## 2019-02-02 ENCOUNTER — Encounter: Payer: Self-pay | Admitting: Urology

## 2019-02-07 ENCOUNTER — Encounter
Admission: RE | Admit: 2019-02-07 | Discharge: 2019-02-07 | Disposition: A | Discharge status: Home / Self Care | Payer: Medicare Other | Source: Ambulatory Visit | Attending: Urology | Admitting: Urology

## 2019-02-07 ENCOUNTER — Ambulatory Visit
Admission: RE | Admit: 2019-02-07 | Discharge: 2019-02-07 | Disposition: A | Discharge status: Home / Self Care | Payer: Medicare Other | Source: Ambulatory Visit | Attending: Urology | Admitting: Urology

## 2019-02-07 ENCOUNTER — Telehealth: Payer: Self-pay | Admitting: Urology

## 2019-02-07 ENCOUNTER — Other Ambulatory Visit: Payer: Self-pay

## 2019-02-07 DIAGNOSIS — C61 Malignant neoplasm of prostate: Secondary | ICD-10-CM

## 2019-02-07 LAB — POCT I-STAT CREATININE: Creatinine, Ser: 1.7 mg/dL — ABNORMAL HIGH (ref 0.61–1.24)

## 2019-02-07 MED ORDER — IOHEXOL 300 MG/ML  SOLN
75.0000 mL | Freq: Once | INTRAMUSCULAR | Status: AC | PRN
  Administered 2019-02-07: 75 mL via INTRAVENOUS

## 2019-02-07 MED ORDER — TECHNETIUM TC 99M MEDRONATE IV KIT
20.0000 | PACK | Freq: Once | INTRAVENOUS | Status: AC | PRN
  Administered 2019-02-07: 23.28 via INTRAVENOUS

## 2019-02-07 NOTE — Addendum Note (Signed)
Addended by: Abbie Sons on: 02/07/2019 04:06 PM   Modules accepted: Orders

## 2019-02-07 NOTE — Telephone Encounter (Signed)
Pt walked into front office and asked if Dr Bernardo Heater could go ahead and maker him an Appt @ the [facility] w/ Dr Baruch Gouty ASAP. I told him that we would probably have to wait for his test results that were done today.

## 2019-02-07 NOTE — Telephone Encounter (Signed)
CT scan showed no pelvic adenopathy or evidence of disease outside of the prostate.  Bone scan was completed today and report is pending.  Referral placed to radiation oncology.

## 2019-02-10 ENCOUNTER — Encounter: Payer: Self-pay | Admitting: Urology

## 2019-02-10 DIAGNOSIS — C61 Malignant neoplasm of prostate: Secondary | ICD-10-CM

## 2019-02-10 NOTE — Telephone Encounter (Signed)
Mr. Hedden was contacted.  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  He has an appointment with radiation oncology this Thursday.  I have also recommended an appointment with medical oncology.  He did request a second opinion at [facility] and will refer to the multidisciplinary oncology clinic with medical and radiation oncology

## 2019-02-10 NOTE — Telephone Encounter (Signed)
Pt called office and would like for you to give him a call about his bone scan and a possible referral to [facility].

## 2019-02-12 ENCOUNTER — Encounter: Payer: Self-pay | Admitting: Family Medicine

## 2019-02-13 ENCOUNTER — Ambulatory Visit: Payer: Medicare Other | Admitting: Radiation Oncology

## 2019-02-13 ENCOUNTER — Telehealth: Payer: Self-pay | Admitting: Urology

## 2019-02-13 NOTE — Telephone Encounter (Signed)
Pt stopped by office and asked for a 2nd opinion to go to [facility].

## 2019-02-13 NOTE — Telephone Encounter (Signed)
I entered the referral in epic on 02/10/2019

## 2019-02-13 NOTE — Telephone Encounter (Signed)
Please advise 

## 2019-02-14 ENCOUNTER — Telehealth: Payer: Self-pay | Admitting: Urology

## 2019-02-14 ENCOUNTER — Ambulatory Visit
Admission: RE | Admit: 2019-02-14 | Discharge: 2019-02-14 | Disposition: A | Discharge status: Home / Self Care | Payer: Medicare Other | Source: Ambulatory Visit | Attending: Radiation Oncology | Admitting: Radiation Oncology

## 2019-02-14 ENCOUNTER — Encounter: Payer: Self-pay | Admitting: Radiation Oncology

## 2019-02-14 ENCOUNTER — Other Ambulatory Visit: Payer: Self-pay

## 2019-02-14 VITALS — BP 174/64 | HR 49 | Temp 98.2°F | Resp 18 | Wt 161.5 lb

## 2019-02-14 DIAGNOSIS — N189 Chronic kidney disease, unspecified: Secondary | ICD-10-CM | POA: Diagnosis not present

## 2019-02-14 DIAGNOSIS — K219 Gastro-esophageal reflux disease without esophagitis: Secondary | ICD-10-CM | POA: Diagnosis not present

## 2019-02-14 DIAGNOSIS — Z79899 Other long term (current) drug therapy: Secondary | ICD-10-CM | POA: Diagnosis not present

## 2019-02-14 DIAGNOSIS — K2270 Barrett's esophagus without dysplasia: Secondary | ICD-10-CM | POA: Diagnosis not present

## 2019-02-14 DIAGNOSIS — Z86010 Personal history of colonic polyps: Secondary | ICD-10-CM | POA: Insufficient documentation

## 2019-02-14 DIAGNOSIS — C61 Malignant neoplasm of prostate: Secondary | ICD-10-CM | POA: Insufficient documentation

## 2019-02-14 DIAGNOSIS — M5136 Other intervertebral disc degeneration, lumbar region: Secondary | ICD-10-CM | POA: Insufficient documentation

## 2019-02-14 DIAGNOSIS — Z87891 Personal history of nicotine dependence: Secondary | ICD-10-CM | POA: Insufficient documentation

## 2019-02-14 DIAGNOSIS — M8580 Other specified disorders of bone density and structure, unspecified site: Secondary | ICD-10-CM | POA: Diagnosis not present

## 2019-02-14 DIAGNOSIS — G4730 Sleep apnea, unspecified: Secondary | ICD-10-CM | POA: Insufficient documentation

## 2019-02-14 DIAGNOSIS — M810 Age-related osteoporosis without current pathological fracture: Secondary | ICD-10-CM | POA: Insufficient documentation

## 2019-02-14 NOTE — Consult Note (Signed)
NEW PATIENT EVALUATION  Name: [name] [name]  MRN: [id number]  Date:   02/14/2019     DOB: [birth year]   This 78 y.o. 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  REFERRING PHYSICIAN: Baxter Hire, MD  CHIEF COMPLAINT:  Chief Complaint  Patient presents with  . Prostate Cancer    Initial Eval    DIAGNOSIS: The encounter diagnosis was Malignant neoplasm of prostate ([place]).   PREVIOUS INVESTIGATIONS:  Pathology reports reviewed CT scans and bone scan reviewed Clinical notes reviewed  HPI: patient is a 78 year old male had not had PSA screening for quite some time. He recently presented with a PSA of 29. This also underwent a rectal exam he had a left lateral lobe induration consistent with malignancy. Transrectal ultrasound-guided biopsies were positive for 6 out of 12 for the left side for mostly Gleason 7 (3+4) with 1 Gleason 7 (4+3). Patient is asymptomatic has nocturia 1 no frequency or urgency. He has had treatment options discussed with Dr. Bernardo Heater. CT scan of the abdomen pelvis was negative bone scan showed a questionable lesion in the lower lumbar spine and potential left lateral ribs with no correlation on my comparison on CT scans. He does have an osteophyte in the lumbar spine which could correspond tohis increased focal uptake on bone scan in the lumbar spine region. He seen today for radiation oncology opinion.  PLANNED TREATMENT REGIMEN: image guided IM RT treatment  PAST MEDICAL HISTORY:  has a past medical history of Age related osteoporosis, Barrett's esophagus, Cataract, CKD (chronic kidney disease), DDD (degenerative disc disease), lumbar, Femur fracture ([place]), GERD (gastroesophageal reflux disease), Hematuria, History of colon polyps, Osteopenia of the elderly, and Sleep apnea.    PAST SURGICAL HISTORY:  Past Surgical History:  Procedure  Laterality Date  . BAND HEMORRHOIDECTOMY    . COLONOSCOPY    . COLONOSCOPY WITH PROPOFOL N/A 05/06/2018   Procedure: COLONOSCOPY WITH PROPOFOL;  Surgeon: Lollie Sails, MD;  Location: [facility] ENDOSCOPY;  Service: Endoscopy;  Laterality: N/A;  . COLONOSCOPY WITH PROPOFOL N/A 09/23/2018   Procedure: COLONOSCOPY WITH PROPOFOL;  Surgeon: Lollie Sails, MD;  Location: [facility] ENDOSCOPY;  Service: Endoscopy;  Laterality: N/A;  . ESOPHAGOGASTRODUODENOSCOPY (EGD) WITH PROPOFOL N/A 10/03/2016   Procedure: ESOPHAGOGASTRODUODENOSCOPY (EGD) WITH PROPOFOL;  Surgeon: Lollie Sails, MD;  Location: [facility] ENDOSCOPY;  Service: Endoscopy;  Laterality: N/A;  . EYE SURGERY    . FRACTURE SURGERY Right 2016   femur  . ORIF DISTAL FEMUR FRACTURE    . TONSILLECTOMY      FAMILY HISTORY: family history is not on file.  SOCIAL HISTORY:  reports that he quit smoking about 44 years ago. His smoking use included cigarettes. He has never used smokeless tobacco. He reports previous alcohol use of about 1.0 standard drinks of alcohol per week. He reports that he does not use drugs.  ALLERGIES: Demerol [meperidine]  MEDICATIONS:  Current Outpatient Medications  Medication Sig Dispense Refill  . FLUAD 0.5 ML SUSY ADM 0.5ML IM UTD  0  . Influenza vac split quadrivalent PF (FLUZONE HIGH-DOSE) 0.5 ML injection Fluzone High-Dose 2017-2018 (PF) 180 mcg/0.5 mL intramuscular syringe    . ipratropium (ATROVENT) 0.03 % nasal spray Place 1 spray into both nostrils 2 (two) times daily.    . Multiple Vitamin (MULTIVITAMIN) tablet Take 1 tablet by mouth 2 (two) times daily.    Marland Kitchen  omeprazole (PRILOSEC) 20 MG capsule Take 20 mg by mouth daily.    . pneumococcal 23 valent vaccine (PNEUMOVAX 23) 25 MCG/0.5ML injection Pneumovax 23 25 mcg/0.5 mL injection syringe    . traZODone (DESYREL) 50 MG tablet Take 50 mg by mouth at bedtime.     No current facility-administered medications for this encounter.     ECOG PERFORMANCE STATUS:   0 - Asymptomatic  REVIEW OF SYSTEMS:  Patient denies any weight loss, fatigue, weakness, fever, chills or night sweats. Patient denies any loss of vision, blurred vision. Patient denies any ringing  of the ears or hearing loss. No irregular heartbeat. Patient denies heart murmur or history of fainting. Patient denies any chest pain or pain radiating to her upper extremities. Patient denies any shortness of breath, difficulty breathing at night, cough or hemoptysis. Patient denies any swelling in the lower legs. Patient denies any nausea vomiting, vomiting of blood, or coffee ground material in the vomitus. Patient denies any stomach pain. Patient states has had normal bowel movements no significant constipation or diarrhea. Patient denies any dysuria, hematuria or significant nocturia. Patient denies any problems walking, swelling in the joints or loss of balance. Patient denies any skin changes, loss of hair or loss of weight. Patient denies any excessive worrying or anxiety or significant depression. Patient denies any problems with insomnia. Patient denies excessive thirst, polyuria, polydipsia. Patient denies any swollen glands, patient denies easy bruising or easy bleeding. Patient denies any recent infections, allergies or URI. Patient "s visual fields have not changed significantly in recent time.    PHYSICAL EXAM: BP (!) 174/64   Pulse (!) 49   Temp 98.2 F (36.8 C)   Resp 18   Wt 161 lb 7.8 oz (73.2 kg)   BMI 24.55 kg/m  On rectal exam rectal sphincter tone is good there is induration of the left lateral lobe of the prostate. Sulcus is preserved bilaterally. No other abnormalities identified on rectal exam.Well-developed well-nourished patient in NAD. HEENT reveals PERLA, EOMI, discs not visualized.  Oral cavity is clear. No oral mucosal lesions are identified. Neck is clear without evidence of cervical or supraclavicular adenopathy. Lungs are clear to A&P. Cardiac examination is essentially  unremarkable with regular rate and rhythm without murmur rub or thrill. Abdomen is benign with no organomegaly or masses noted. Motor sensory and DTR levels are equal and symmetric in the upper and lower extremities. Cranial nerves II through XII are grossly intact. Proprioception is intact. No peripheral adenopathy or edema is identified. No motor or sensory levels are noted. Crude visual fields are within normal range.  LABORATORY DATA: pathology reports reviewed    RADIOLOGY RESULTS:CT scan and bone scan reviewed compatible above-stated findings   IMPRESSION: stage IIB (T2 be N0 M0) Gleason 7 adenocarcinoma the prostate presenting the PSA of 40 in 78 year old male  PLAN: at this time I have compared his films believe we are probably not dealing with stage IV disease. I have run his minimal [facility] nomogram showing approximately 80% chance of extracapsular extension. The was less than 15% chance of lymph node involvement. Based on those findings I also presume we are not dealing with stage IV disease. I would treat the patient is a stage IIB disease. I would recommend androgen deprivation therapy along with image guided radiation therapy to his prostate. I would plan on 8000 cGy over 8 weeks. I've asked Dr. Bernardo Heater to place fiduciary markers in his prostate for daily image guided treatment. Risks and benefits  of treatment including increased lower urinary tract symptoms fatigue alteration of blood counts possible diarrhea all were discussed in detail with the patient and his wife. They both seem to comprehend my treatment plan well. Markers were given to patient we will set up CT guided simulation after markers are placed. Should these bone lesions progress over time we can always treat them and it future date as a ligamentous metastatic disease.  I would like to take this opportunity to thank you for allowing me to participate in the care of your patient.Noreene Filbert,  MD

## 2019-02-14 NOTE — Telephone Encounter (Signed)
Pt stopped by office and dropped a letter off to Dr Bernardo Heater.  I put in on his desk.

## 2019-02-17 NOTE — Telephone Encounter (Signed)
Spoke to wife on Friday. Aware he will attempt to reach when he returns in 1 week.

## 2019-02-18 ENCOUNTER — Telehealth: Payer: Self-pay | Admitting: Urology

## 2019-02-18 NOTE — Telephone Encounter (Signed)
Pt called and would like to make an Appt w/ you to have "Biomarkers Inserted" ASAP, He would like a call back to discuss this. Please advise.

## 2019-02-18 NOTE — Telephone Encounter (Signed)
ERROR

## 2019-02-18 NOTE — Telephone Encounter (Signed)
Needs to be scheduled for prostate ultrasound with insertion fiducial markers (gold seeds)

## 2019-02-19 ENCOUNTER — Telehealth: Payer: Self-pay | Admitting: Urology

## 2019-02-19 NOTE — Telephone Encounter (Signed)
NO PA REQUIRED FOR LUPRON PT HAS MEDICARE AND TRICARE   MICHELLE

## 2019-02-24 ENCOUNTER — Other Ambulatory Visit: Payer: Self-pay | Admitting: Urology

## 2019-02-26 ENCOUNTER — Other Ambulatory Visit: Payer: Self-pay

## 2019-02-27 ENCOUNTER — Other Ambulatory Visit: Payer: Self-pay

## 2019-02-27 ENCOUNTER — Ambulatory Visit
Admission: RE | Admit: 2019-02-27 | Discharge: 2019-02-27 | Disposition: A | Discharge status: Home / Self Care | Payer: Medicare Other | Source: Ambulatory Visit | Attending: Radiation Oncology | Admitting: Radiation Oncology

## 2019-02-27 DIAGNOSIS — K219 Gastro-esophageal reflux disease without esophagitis: Secondary | ICD-10-CM | POA: Insufficient documentation

## 2019-02-27 DIAGNOSIS — Z87891 Personal history of nicotine dependence: Secondary | ICD-10-CM | POA: Insufficient documentation

## 2019-02-27 DIAGNOSIS — G4730 Sleep apnea, unspecified: Secondary | ICD-10-CM | POA: Diagnosis not present

## 2019-02-27 DIAGNOSIS — Z79899 Other long term (current) drug therapy: Secondary | ICD-10-CM | POA: Insufficient documentation

## 2019-02-27 DIAGNOSIS — Z510 Encounter for antineoplastic radiation therapy: Secondary | ICD-10-CM | POA: Insufficient documentation

## 2019-02-27 DIAGNOSIS — C61 Malignant neoplasm of prostate: Secondary | ICD-10-CM | POA: Diagnosis not present

## 2019-02-27 DIAGNOSIS — N189 Chronic kidney disease, unspecified: Secondary | ICD-10-CM | POA: Insufficient documentation

## 2019-03-03 DIAGNOSIS — Z510 Encounter for antineoplastic radiation therapy: Secondary | ICD-10-CM | POA: Diagnosis not present

## 2019-03-06 ENCOUNTER — Other Ambulatory Visit: Payer: Self-pay | Admitting: *Deleted

## 2019-03-06 DIAGNOSIS — C61 Malignant neoplasm of prostate: Secondary | ICD-10-CM

## 2019-03-10 ENCOUNTER — Other Ambulatory Visit: Payer: Self-pay

## 2019-03-10 ENCOUNTER — Ambulatory Visit
Admission: RE | Admit: 2019-03-10 | Discharge: 2019-03-10 | Disposition: A | Discharge status: Home / Self Care | Payer: Medicare Other | Source: Ambulatory Visit | Attending: Radiation Oncology | Admitting: Radiation Oncology

## 2019-03-10 DIAGNOSIS — C61 Malignant neoplasm of prostate: Secondary | ICD-10-CM | POA: Insufficient documentation

## 2019-03-10 DIAGNOSIS — Z510 Encounter for antineoplastic radiation therapy: Secondary | ICD-10-CM | POA: Insufficient documentation

## 2019-03-10 DIAGNOSIS — G4730 Sleep apnea, unspecified: Secondary | ICD-10-CM | POA: Insufficient documentation

## 2019-03-10 DIAGNOSIS — K219 Gastro-esophageal reflux disease without esophagitis: Secondary | ICD-10-CM | POA: Insufficient documentation

## 2019-03-10 DIAGNOSIS — N189 Chronic kidney disease, unspecified: Secondary | ICD-10-CM | POA: Insufficient documentation

## 2019-03-10 DIAGNOSIS — Z79899 Other long term (current) drug therapy: Secondary | ICD-10-CM | POA: Insufficient documentation

## 2019-03-10 DIAGNOSIS — Z87891 Personal history of nicotine dependence: Secondary | ICD-10-CM | POA: Insufficient documentation

## 2019-03-11 ENCOUNTER — Telehealth: Payer: Self-pay | Admitting: Urology

## 2019-03-11 ENCOUNTER — Other Ambulatory Visit: Payer: Self-pay

## 2019-03-11 ENCOUNTER — Ambulatory Visit
Admission: RE | Admit: 2019-03-11 | Discharge: 2019-03-11 | Disposition: A | Discharge status: Home / Self Care | Payer: Medicare Other | Source: Ambulatory Visit | Attending: Radiation Oncology | Admitting: Radiation Oncology

## 2019-03-11 DIAGNOSIS — G4730 Sleep apnea, unspecified: Secondary | ICD-10-CM | POA: Diagnosis not present

## 2019-03-11 DIAGNOSIS — Z510 Encounter for antineoplastic radiation therapy: Secondary | ICD-10-CM | POA: Diagnosis present

## 2019-03-11 DIAGNOSIS — K219 Gastro-esophageal reflux disease without esophagitis: Secondary | ICD-10-CM | POA: Diagnosis not present

## 2019-03-11 DIAGNOSIS — C61 Malignant neoplasm of prostate: Secondary | ICD-10-CM | POA: Diagnosis not present

## 2019-03-11 DIAGNOSIS — Z79899 Other long term (current) drug therapy: Secondary | ICD-10-CM | POA: Diagnosis not present

## 2019-03-11 DIAGNOSIS — N189 Chronic kidney disease, unspecified: Secondary | ICD-10-CM | POA: Diagnosis not present

## 2019-03-11 DIAGNOSIS — Z87891 Personal history of nicotine dependence: Secondary | ICD-10-CM | POA: Diagnosis not present

## 2019-03-11 NOTE — Telephone Encounter (Signed)
App cx per Maudie Mercury at the cancer center

## 2019-03-11 NOTE — Telephone Encounter (Signed)
-----   Message from Christean Grief, RN sent at 03/11/2019  9:14 AM EDT ----- Do you have authorization for Lupron for this patient?  Also will you cancel the placement of gold seed markers appointment ?  The patient was anxious to get started.

## 2019-03-12 ENCOUNTER — Other Ambulatory Visit: Payer: Self-pay

## 2019-03-12 ENCOUNTER — Ambulatory Visit
Admission: RE | Admit: 2019-03-12 | Discharge: 2019-03-12 | Disposition: A | Discharge status: Home / Self Care | Payer: Medicare Other | Source: Ambulatory Visit | Attending: Radiation Oncology | Admitting: Radiation Oncology

## 2019-03-12 DIAGNOSIS — Z510 Encounter for antineoplastic radiation therapy: Secondary | ICD-10-CM | POA: Diagnosis not present

## 2019-03-13 ENCOUNTER — Ambulatory Visit
Admission: RE | Admit: 2019-03-13 | Discharge: 2019-03-13 | Disposition: A | Discharge status: Home / Self Care | Payer: Medicare Other | Source: Ambulatory Visit | Attending: Radiation Oncology | Admitting: Radiation Oncology

## 2019-03-13 ENCOUNTER — Other Ambulatory Visit: Payer: Self-pay

## 2019-03-13 DIAGNOSIS — Z510 Encounter for antineoplastic radiation therapy: Secondary | ICD-10-CM | POA: Diagnosis not present

## 2019-03-14 ENCOUNTER — Ambulatory Visit
Admission: RE | Admit: 2019-03-14 | Discharge: 2019-03-14 | Disposition: A | Discharge status: Home / Self Care | Payer: Medicare Other | Source: Ambulatory Visit | Attending: Radiation Oncology | Admitting: Radiation Oncology

## 2019-03-14 ENCOUNTER — Other Ambulatory Visit: Payer: Self-pay

## 2019-03-14 DIAGNOSIS — Z510 Encounter for antineoplastic radiation therapy: Secondary | ICD-10-CM | POA: Diagnosis not present

## 2019-03-17 ENCOUNTER — Ambulatory Visit
Admission: RE | Admit: 2019-03-17 | Discharge: 2019-03-17 | Disposition: A | Discharge status: Home / Self Care | Payer: Medicare Other | Source: Ambulatory Visit | Attending: Radiation Oncology | Admitting: Radiation Oncology

## 2019-03-17 ENCOUNTER — Other Ambulatory Visit: Payer: Self-pay

## 2019-03-17 DIAGNOSIS — Z510 Encounter for antineoplastic radiation therapy: Secondary | ICD-10-CM | POA: Diagnosis not present

## 2019-03-18 ENCOUNTER — Other Ambulatory Visit: Payer: Self-pay

## 2019-03-18 ENCOUNTER — Ambulatory Visit (INDEPENDENT_AMBULATORY_CARE_PROVIDER_SITE_OTHER): Payer: Medicare Other | Admitting: Urology

## 2019-03-18 ENCOUNTER — Encounter: Payer: Self-pay | Admitting: Urology

## 2019-03-18 ENCOUNTER — Ambulatory Visit
Admission: RE | Admit: 2019-03-18 | Discharge: 2019-03-18 | Disposition: A | Discharge status: Home / Self Care | Payer: Medicare Other | Source: Ambulatory Visit | Attending: Radiation Oncology | Admitting: Radiation Oncology

## 2019-03-18 VITALS — BP 142/59 | HR 50 | Ht 69.0 in | Wt 160.0 lb

## 2019-03-18 DIAGNOSIS — Z510 Encounter for antineoplastic radiation therapy: Secondary | ICD-10-CM | POA: Diagnosis not present

## 2019-03-18 DIAGNOSIS — C61 Malignant neoplasm of prostate: Secondary | ICD-10-CM | POA: Diagnosis not present

## 2019-03-18 MED ORDER — LEUPROLIDE ACETATE (6 MONTH) 45 MG IM KIT
45.0000 mg | PACK | Freq: Once | INTRAMUSCULAR | Status: AC
  Administered 2019-03-18: 45 mg via INTRAMUSCULAR

## 2019-03-18 NOTE — Patient Instructions (Addendum)
BEGIN TAKING VITAMIN D and CALCIUM.  Leuprolide injection What is this medicine? LEUPROLIDE (loo PROE lide) is a man-made hormone. It is used to treat the symptoms of prostate cancer. This medicine may also be used to treat children with early onset of puberty. It may be used for other hormonal conditions. This medicine may be used for other purposes; ask your health care provider or pharmacist if you have questions. COMMON BRAND NAME(S): Lupron What should I tell my health care provider before I take this medicine? They need to know if you have any of these conditions: -diabetes -heart disease or previous heart attack -high blood pressure -high cholesterol -pain or difficulty passing urine -spinal cord metastasis -stroke -tobacco smoker -an unusual or allergic reaction to leuprolide, benzyl alcohol, other medicines, foods, dyes, or preservatives -pregnant or trying to get pregnant -breast-feeding How should I use this medicine? This medicine is for injection under the skin or into a muscle. You will be taught how to prepare and give this medicine. Use exactly as directed. Take your medicine at regular intervals. Do not take your medicine more often than directed. It is important that you put your used needles and syringes in a special sharps container. Do not put them in a trash can. If you do not have a sharps container, call your pharmacist or healthcare provider to get one. A special MedGuide will be given to you by the pharmacist with each prescription and refill. Be sure to read this information carefully each time. Talk to your pediatrician regarding the use of this medicine in children. While this medicine may be prescribed for children as young as 8 years for selected conditions, precautions do apply. Overdosage: If you think you have taken too much of this medicine contact a poison control center or emergency room at once. NOTE: This medicine is only for you. Do not share this  medicine with others. What if I miss a dose? If you miss a dose, take it as soon as you can. If it is almost time for your next dose, take only that dose. Do not take double or extra doses. What may interact with this medicine? Do not take this medicine with any of the following medications: -chasteberry This medicine may also interact with the following medications: -herbal or dietary supplements, like black cohosh or DHEA -male hormones, like estrogens or progestins and birth control pills, patches, rings, or injections -male hormones, like testosterone This list may not describe all possible interactions. Give your health care provider a list of all the medicines, herbs, non-prescription drugs, or dietary supplements you use. Also tell them if you smoke, drink alcohol, or use illegal drugs. Some items may interact with your medicine. What should I watch for while using this medicine? Visit your doctor or health care professional for regular checks on your progress. During the first week, your symptoms may get worse, but then will improve as you continue your treatment. You may get hot flashes, increased bone pain, increased difficulty passing urine, or an aggravation of nerve symptoms. Discuss these effects with your doctor or health care professional, some of them may improve with continued use of this medicine. Male patients may experience a menstrual cycle or spotting during the first 2 months of therapy with this medicine. If this continues, contact your doctor or health care professional. What side effects may I notice from receiving this medicine? Side effects that you should report to your doctor or health care professional as soon as possible: -  allergic reactions like skin rash, itching or hives, swelling of the face, lips, or tongue -breathing problems -chest pain -depression or memory disorders -pain in your legs or groin -pain at site where injected -severe headache -swelling  of the feet and legs -visual changes -vomiting Side effects that usually do not require medical attention (report to your doctor or health care professional if they continue or are bothersome): -breast swelling or tenderness -decrease in sex drive or performance -diarrhea -hot flashes -loss of appetite -muscle, joint, or bone pains -nausea -redness or irritation at site where injected -skin problems or acne This list may not describe all possible side effects. Call your doctor for medical advice about side effects. You may report side effects to FDA at 1-800-FDA-1088. Where should I keep my medicine? Keep out of the reach of children. Store below 25 degrees C (77 degrees F). Do not freeze. Protect from light. Do not use if it is not clear or if there are particles present. Throw away any unused medicine after the expiration date. NOTE: This sheet is a summary. It may not cover all possible information. If you have questions about this medicine, talk to your doctor, pharmacist, or health care provider.  2019 Elsevier/Gold Standard (2016-07-10 10:54:35)

## 2019-03-18 NOTE — Progress Notes (Signed)
78 year old male with high risk prostate cancer currently undergoing IMRT + ADT.  The presents for Lupron injection today.  Side effects have been discussed.  John Giovanni, MD  Lupron IM Injection   Due to Prostate Cancer patient is present today for a Lupron Injection.  Medication: Lupron 6 month Dose: 45 mg  Location: right upper outer buttocks Lot: [id number] Exp: 04/09/21  Patient tolerated well, no complications were noted  Performed by: Shawnie Dapper, CMA  Follow up: as scheduled. Next injection after 09/14/19. Patient instructed to begin taking Vitamin D and Calcium

## 2019-03-19 ENCOUNTER — Ambulatory Visit
Admission: RE | Admit: 2019-03-19 | Discharge: 2019-03-19 | Disposition: A | Discharge status: Home / Self Care | Payer: Medicare Other | Source: Ambulatory Visit | Attending: Radiation Oncology | Admitting: Radiation Oncology

## 2019-03-19 ENCOUNTER — Other Ambulatory Visit: Payer: Self-pay

## 2019-03-19 DIAGNOSIS — Z510 Encounter for antineoplastic radiation therapy: Secondary | ICD-10-CM | POA: Diagnosis not present

## 2019-03-20 ENCOUNTER — Ambulatory Visit
Admission: RE | Admit: 2019-03-20 | Discharge: 2019-03-20 | Disposition: A | Discharge status: Home / Self Care | Payer: Medicare Other | Source: Ambulatory Visit | Attending: Radiation Oncology | Admitting: Radiation Oncology

## 2019-03-20 ENCOUNTER — Other Ambulatory Visit: Payer: Self-pay

## 2019-03-20 DIAGNOSIS — Z510 Encounter for antineoplastic radiation therapy: Secondary | ICD-10-CM | POA: Diagnosis not present

## 2019-03-21 ENCOUNTER — Other Ambulatory Visit: Payer: Self-pay

## 2019-03-21 ENCOUNTER — Ambulatory Visit
Admission: RE | Admit: 2019-03-21 | Discharge: 2019-03-21 | Disposition: A | Discharge status: Home / Self Care | Payer: Medicare Other | Source: Ambulatory Visit | Attending: Radiation Oncology | Admitting: Radiation Oncology

## 2019-03-21 DIAGNOSIS — Z510 Encounter for antineoplastic radiation therapy: Secondary | ICD-10-CM | POA: Diagnosis not present

## 2019-03-24 ENCOUNTER — Other Ambulatory Visit: Payer: Self-pay

## 2019-03-24 ENCOUNTER — Other Ambulatory Visit: Payer: Self-pay | Admitting: Urology

## 2019-03-24 ENCOUNTER — Ambulatory Visit
Admission: RE | Admit: 2019-03-24 | Discharge: 2019-03-24 | Disposition: A | Discharge status: Home / Self Care | Payer: Medicare Other | Source: Ambulatory Visit | Attending: Radiation Oncology | Admitting: Radiation Oncology

## 2019-03-24 DIAGNOSIS — Z510 Encounter for antineoplastic radiation therapy: Secondary | ICD-10-CM | POA: Diagnosis not present

## 2019-03-25 ENCOUNTER — Other Ambulatory Visit: Payer: Self-pay

## 2019-03-25 ENCOUNTER — Ambulatory Visit
Admission: RE | Admit: 2019-03-25 | Discharge: 2019-03-25 | Disposition: A | Discharge status: Home / Self Care | Payer: Medicare Other | Source: Ambulatory Visit | Attending: Radiation Oncology | Admitting: Radiation Oncology

## 2019-03-25 DIAGNOSIS — Z510 Encounter for antineoplastic radiation therapy: Secondary | ICD-10-CM | POA: Diagnosis not present

## 2019-03-26 ENCOUNTER — Inpatient Hospital Stay: Payer: Medicare Other | Attending: Radiation Oncology

## 2019-03-26 ENCOUNTER — Other Ambulatory Visit: Payer: Self-pay

## 2019-03-26 ENCOUNTER — Ambulatory Visit
Admission: RE | Admit: 2019-03-26 | Discharge: 2019-03-26 | Disposition: A | Discharge status: Home / Self Care | Payer: Medicare Other | Source: Ambulatory Visit | Attending: Radiation Oncology | Admitting: Radiation Oncology

## 2019-03-26 DIAGNOSIS — C61 Malignant neoplasm of prostate: Secondary | ICD-10-CM | POA: Diagnosis not present

## 2019-03-26 DIAGNOSIS — Z510 Encounter for antineoplastic radiation therapy: Secondary | ICD-10-CM | POA: Diagnosis not present

## 2019-03-26 LAB — CBC
HCT: 46.4 % (ref 39.0–52.0)
Hemoglobin: 14.9 g/dL (ref 13.0–17.0)
MCH: 29.4 pg (ref 26.0–34.0)
MCHC: 32.1 g/dL (ref 30.0–36.0)
MCV: 91.5 fL (ref 80.0–100.0)
Platelets: 173 10*3/uL (ref 150–400)
RBC: 5.07 MIL/uL (ref 4.22–5.81)
RDW: 13.4 % (ref 11.5–15.5)
WBC: 5.7 10*3/uL (ref 4.0–10.5)
nRBC: 0 % (ref 0.0–0.2)

## 2019-03-27 ENCOUNTER — Ambulatory Visit
Admission: RE | Admit: 2019-03-27 | Discharge: 2019-03-27 | Disposition: A | Discharge status: Home / Self Care | Payer: Medicare Other | Source: Ambulatory Visit | Attending: Radiation Oncology | Admitting: Radiation Oncology

## 2019-03-27 ENCOUNTER — Other Ambulatory Visit: Payer: Self-pay

## 2019-03-27 DIAGNOSIS — Z510 Encounter for antineoplastic radiation therapy: Secondary | ICD-10-CM | POA: Diagnosis not present

## 2019-03-28 ENCOUNTER — Other Ambulatory Visit: Payer: Self-pay

## 2019-03-28 ENCOUNTER — Ambulatory Visit
Admission: RE | Admit: 2019-03-28 | Discharge: 2019-03-28 | Disposition: A | Discharge status: Home / Self Care | Payer: Medicare Other | Source: Ambulatory Visit | Attending: Radiation Oncology | Admitting: Radiation Oncology

## 2019-03-28 DIAGNOSIS — Z510 Encounter for antineoplastic radiation therapy: Secondary | ICD-10-CM | POA: Diagnosis not present

## 2019-03-31 ENCOUNTER — Other Ambulatory Visit: Payer: Self-pay

## 2019-03-31 ENCOUNTER — Ambulatory Visit
Admission: RE | Admit: 2019-03-31 | Discharge: 2019-03-31 | Disposition: A | Discharge status: Home / Self Care | Payer: Medicare Other | Source: Ambulatory Visit | Attending: Radiation Oncology | Admitting: Radiation Oncology

## 2019-03-31 DIAGNOSIS — Z510 Encounter for antineoplastic radiation therapy: Secondary | ICD-10-CM | POA: Diagnosis not present

## 2019-04-01 ENCOUNTER — Ambulatory Visit
Admission: RE | Admit: 2019-04-01 | Discharge: 2019-04-01 | Disposition: A | Discharge status: Home / Self Care | Payer: Medicare Other | Source: Ambulatory Visit | Attending: Radiation Oncology | Admitting: Radiation Oncology

## 2019-04-01 ENCOUNTER — Other Ambulatory Visit: Payer: Self-pay

## 2019-04-01 DIAGNOSIS — Z510 Encounter for antineoplastic radiation therapy: Secondary | ICD-10-CM | POA: Diagnosis not present

## 2019-04-02 ENCOUNTER — Ambulatory Visit
Admission: RE | Admit: 2019-04-02 | Discharge: 2019-04-02 | Disposition: A | Discharge status: Home / Self Care | Payer: Medicare Other | Source: Ambulatory Visit | Attending: Radiation Oncology | Admitting: Radiation Oncology

## 2019-04-02 ENCOUNTER — Other Ambulatory Visit: Payer: Self-pay

## 2019-04-02 DIAGNOSIS — Z510 Encounter for antineoplastic radiation therapy: Secondary | ICD-10-CM | POA: Diagnosis not present

## 2019-04-03 ENCOUNTER — Other Ambulatory Visit: Payer: Self-pay

## 2019-04-03 ENCOUNTER — Ambulatory Visit
Admission: RE | Admit: 2019-04-03 | Discharge: 2019-04-03 | Disposition: A | Discharge status: Home / Self Care | Payer: Medicare Other | Source: Ambulatory Visit | Attending: Radiation Oncology | Admitting: Radiation Oncology

## 2019-04-03 DIAGNOSIS — Z510 Encounter for antineoplastic radiation therapy: Secondary | ICD-10-CM | POA: Diagnosis not present

## 2019-04-04 ENCOUNTER — Other Ambulatory Visit: Payer: Self-pay

## 2019-04-04 ENCOUNTER — Ambulatory Visit
Admission: RE | Admit: 2019-04-04 | Discharge: 2019-04-04 | Disposition: A | Discharge status: Home / Self Care | Payer: Medicare Other | Source: Ambulatory Visit | Attending: Radiation Oncology | Admitting: Radiation Oncology

## 2019-04-04 DIAGNOSIS — G4730 Sleep apnea, unspecified: Secondary | ICD-10-CM | POA: Insufficient documentation

## 2019-04-04 DIAGNOSIS — K219 Gastro-esophageal reflux disease without esophagitis: Secondary | ICD-10-CM | POA: Insufficient documentation

## 2019-04-04 DIAGNOSIS — Z79899 Other long term (current) drug therapy: Secondary | ICD-10-CM | POA: Diagnosis not present

## 2019-04-04 DIAGNOSIS — C61 Malignant neoplasm of prostate: Secondary | ICD-10-CM | POA: Diagnosis not present

## 2019-04-04 DIAGNOSIS — Z510 Encounter for antineoplastic radiation therapy: Secondary | ICD-10-CM | POA: Diagnosis present

## 2019-04-04 DIAGNOSIS — N189 Chronic kidney disease, unspecified: Secondary | ICD-10-CM | POA: Diagnosis not present

## 2019-04-04 DIAGNOSIS — Z87891 Personal history of nicotine dependence: Secondary | ICD-10-CM | POA: Diagnosis not present

## 2019-04-07 ENCOUNTER — Ambulatory Visit
Admission: RE | Admit: 2019-04-07 | Discharge: 2019-04-07 | Disposition: A | Discharge status: Home / Self Care | Payer: Medicare Other | Source: Ambulatory Visit | Attending: Radiation Oncology | Admitting: Radiation Oncology

## 2019-04-07 ENCOUNTER — Other Ambulatory Visit: Payer: Self-pay

## 2019-04-07 DIAGNOSIS — Z510 Encounter for antineoplastic radiation therapy: Secondary | ICD-10-CM | POA: Diagnosis not present

## 2019-04-08 ENCOUNTER — Ambulatory Visit
Admission: RE | Admit: 2019-04-08 | Discharge: 2019-04-08 | Disposition: A | Discharge status: Home / Self Care | Payer: Medicare Other | Source: Ambulatory Visit | Attending: Radiation Oncology | Admitting: Radiation Oncology

## 2019-04-08 ENCOUNTER — Other Ambulatory Visit: Payer: Self-pay

## 2019-04-08 DIAGNOSIS — Z510 Encounter for antineoplastic radiation therapy: Secondary | ICD-10-CM | POA: Diagnosis not present

## 2019-04-09 ENCOUNTER — Other Ambulatory Visit: Payer: Self-pay

## 2019-04-09 ENCOUNTER — Ambulatory Visit
Admission: RE | Admit: 2019-04-09 | Discharge: 2019-04-09 | Disposition: A | Discharge status: Home / Self Care | Payer: Medicare Other | Source: Ambulatory Visit | Attending: Radiation Oncology | Admitting: Radiation Oncology

## 2019-04-09 ENCOUNTER — Inpatient Hospital Stay: Payer: Medicare Other

## 2019-04-09 DIAGNOSIS — C61 Malignant neoplasm of prostate: Secondary | ICD-10-CM

## 2019-04-09 DIAGNOSIS — Z510 Encounter for antineoplastic radiation therapy: Secondary | ICD-10-CM | POA: Diagnosis not present

## 2019-04-09 LAB — CBC
HCT: 45.8 % (ref 39.0–52.0)
Hemoglobin: 14.9 g/dL (ref 13.0–17.0)
MCH: 29.7 pg (ref 26.0–34.0)
MCHC: 32.5 g/dL (ref 30.0–36.0)
MCV: 91.4 fL (ref 80.0–100.0)
Platelets: 161 10*3/uL (ref 150–400)
RBC: 5.01 MIL/uL (ref 4.22–5.81)
RDW: 13.2 % (ref 11.5–15.5)
WBC: 4.7 10*3/uL (ref 4.0–10.5)
nRBC: 0 % (ref 0.0–0.2)

## 2019-04-10 ENCOUNTER — Other Ambulatory Visit: Payer: Self-pay

## 2019-04-10 ENCOUNTER — Ambulatory Visit
Admission: RE | Admit: 2019-04-10 | Discharge: 2019-04-10 | Disposition: A | Discharge status: Home / Self Care | Payer: Medicare Other | Source: Ambulatory Visit | Attending: Radiation Oncology | Admitting: Radiation Oncology

## 2019-04-10 DIAGNOSIS — Z510 Encounter for antineoplastic radiation therapy: Secondary | ICD-10-CM | POA: Diagnosis not present

## 2019-04-11 ENCOUNTER — Ambulatory Visit
Admission: RE | Admit: 2019-04-11 | Discharge: 2019-04-11 | Disposition: A | Discharge status: Home / Self Care | Payer: Medicare Other | Source: Ambulatory Visit | Attending: Radiation Oncology | Admitting: Radiation Oncology

## 2019-04-11 ENCOUNTER — Other Ambulatory Visit: Payer: Self-pay

## 2019-04-11 DIAGNOSIS — Z510 Encounter for antineoplastic radiation therapy: Secondary | ICD-10-CM | POA: Diagnosis not present

## 2019-04-14 ENCOUNTER — Other Ambulatory Visit: Payer: Self-pay | Admitting: Urology

## 2019-04-14 ENCOUNTER — Other Ambulatory Visit: Payer: Self-pay

## 2019-04-14 ENCOUNTER — Ambulatory Visit
Admission: RE | Admit: 2019-04-14 | Discharge: 2019-04-14 | Disposition: A | Discharge status: Home / Self Care | Payer: Medicare Other | Source: Ambulatory Visit | Attending: Radiation Oncology | Admitting: Radiation Oncology

## 2019-04-14 DIAGNOSIS — Z510 Encounter for antineoplastic radiation therapy: Secondary | ICD-10-CM | POA: Diagnosis not present

## 2019-04-15 ENCOUNTER — Ambulatory Visit
Admission: RE | Admit: 2019-04-15 | Discharge: 2019-04-15 | Disposition: A | Discharge status: Home / Self Care | Payer: Medicare Other | Source: Ambulatory Visit | Attending: Radiation Oncology | Admitting: Radiation Oncology

## 2019-04-15 ENCOUNTER — Other Ambulatory Visit: Payer: Self-pay

## 2019-04-15 DIAGNOSIS — Z510 Encounter for antineoplastic radiation therapy: Secondary | ICD-10-CM | POA: Diagnosis not present

## 2019-04-16 ENCOUNTER — Ambulatory Visit
Admission: RE | Admit: 2019-04-16 | Discharge: 2019-04-16 | Disposition: A | Discharge status: Home / Self Care | Payer: Medicare Other | Source: Ambulatory Visit | Attending: Radiation Oncology | Admitting: Radiation Oncology

## 2019-04-16 ENCOUNTER — Other Ambulatory Visit: Payer: Self-pay

## 2019-04-16 DIAGNOSIS — Z510 Encounter for antineoplastic radiation therapy: Secondary | ICD-10-CM | POA: Diagnosis not present

## 2019-04-17 ENCOUNTER — Ambulatory Visit
Admission: RE | Admit: 2019-04-17 | Discharge: 2019-04-17 | Disposition: A | Discharge status: Home / Self Care | Payer: Medicare Other | Source: Ambulatory Visit | Attending: Radiation Oncology | Admitting: Radiation Oncology

## 2019-04-17 ENCOUNTER — Other Ambulatory Visit: Payer: Self-pay

## 2019-04-17 DIAGNOSIS — Z510 Encounter for antineoplastic radiation therapy: Secondary | ICD-10-CM | POA: Diagnosis not present

## 2019-04-18 ENCOUNTER — Other Ambulatory Visit: Payer: Self-pay

## 2019-04-18 ENCOUNTER — Ambulatory Visit
Admission: RE | Admit: 2019-04-18 | Discharge: 2019-04-18 | Disposition: A | Discharge status: Home / Self Care | Payer: Medicare Other | Source: Ambulatory Visit | Attending: Radiation Oncology | Admitting: Radiation Oncology

## 2019-04-18 DIAGNOSIS — Z510 Encounter for antineoplastic radiation therapy: Secondary | ICD-10-CM | POA: Diagnosis not present

## 2019-04-21 ENCOUNTER — Ambulatory Visit
Admission: RE | Admit: 2019-04-21 | Discharge: 2019-04-21 | Disposition: A | Discharge status: Home / Self Care | Payer: Medicare Other | Source: Ambulatory Visit | Attending: Radiation Oncology | Admitting: Radiation Oncology

## 2019-04-21 ENCOUNTER — Other Ambulatory Visit: Payer: Self-pay

## 2019-04-21 DIAGNOSIS — Z510 Encounter for antineoplastic radiation therapy: Secondary | ICD-10-CM | POA: Diagnosis not present

## 2019-04-22 ENCOUNTER — Other Ambulatory Visit: Payer: Self-pay

## 2019-04-22 ENCOUNTER — Ambulatory Visit
Admission: RE | Admit: 2019-04-22 | Discharge: 2019-04-22 | Disposition: A | Discharge status: Home / Self Care | Payer: Medicare Other | Source: Ambulatory Visit | Attending: Radiation Oncology | Admitting: Radiation Oncology

## 2019-04-22 DIAGNOSIS — Z510 Encounter for antineoplastic radiation therapy: Secondary | ICD-10-CM | POA: Diagnosis not present

## 2019-04-23 ENCOUNTER — Other Ambulatory Visit: Payer: Self-pay

## 2019-04-23 ENCOUNTER — Inpatient Hospital Stay: Payer: Medicare Other | Attending: Radiation Oncology

## 2019-04-23 ENCOUNTER — Ambulatory Visit
Admission: RE | Admit: 2019-04-23 | Discharge: 2019-04-23 | Disposition: A | Discharge status: Home / Self Care | Payer: Medicare Other | Source: Ambulatory Visit | Attending: Radiation Oncology | Admitting: Radiation Oncology

## 2019-04-23 DIAGNOSIS — Z510 Encounter for antineoplastic radiation therapy: Secondary | ICD-10-CM | POA: Diagnosis not present

## 2019-04-23 DIAGNOSIS — C61 Malignant neoplasm of prostate: Secondary | ICD-10-CM | POA: Insufficient documentation

## 2019-04-23 LAB — CBC
HCT: 44.4 % (ref 39.0–52.0)
Hemoglobin: 14.5 g/dL (ref 13.0–17.0)
MCH: 29.8 pg (ref 26.0–34.0)
MCHC: 32.7 g/dL (ref 30.0–36.0)
MCV: 91.2 fL (ref 80.0–100.0)
Platelets: 155 10*3/uL (ref 150–400)
RBC: 4.87 MIL/uL (ref 4.22–5.81)
RDW: 12.9 % (ref 11.5–15.5)
WBC: 4.8 10*3/uL (ref 4.0–10.5)
nRBC: 0 % (ref 0.0–0.2)

## 2019-04-24 ENCOUNTER — Ambulatory Visit
Admission: RE | Admit: 2019-04-24 | Discharge: 2019-04-24 | Disposition: A | Discharge status: Home / Self Care | Payer: Medicare Other | Source: Ambulatory Visit | Attending: Radiation Oncology | Admitting: Radiation Oncology

## 2019-04-24 ENCOUNTER — Other Ambulatory Visit: Payer: Self-pay

## 2019-04-24 DIAGNOSIS — Z510 Encounter for antineoplastic radiation therapy: Secondary | ICD-10-CM | POA: Diagnosis not present

## 2019-04-25 ENCOUNTER — Other Ambulatory Visit: Payer: Self-pay

## 2019-04-25 ENCOUNTER — Ambulatory Visit
Admission: RE | Admit: 2019-04-25 | Discharge: 2019-04-25 | Disposition: A | Discharge status: Home / Self Care | Payer: Medicare Other | Source: Ambulatory Visit | Attending: Radiation Oncology | Admitting: Radiation Oncology

## 2019-04-25 DIAGNOSIS — Z510 Encounter for antineoplastic radiation therapy: Secondary | ICD-10-CM | POA: Diagnosis not present

## 2019-04-29 ENCOUNTER — Other Ambulatory Visit: Payer: Self-pay

## 2019-04-29 ENCOUNTER — Ambulatory Visit
Admission: RE | Admit: 2019-04-29 | Discharge: 2019-04-29 | Disposition: A | Discharge status: Home / Self Care | Payer: Medicare Other | Source: Ambulatory Visit | Attending: Radiation Oncology | Admitting: Radiation Oncology

## 2019-04-29 DIAGNOSIS — Z510 Encounter for antineoplastic radiation therapy: Secondary | ICD-10-CM | POA: Diagnosis not present

## 2019-04-30 ENCOUNTER — Other Ambulatory Visit: Payer: Self-pay

## 2019-04-30 ENCOUNTER — Ambulatory Visit
Admission: RE | Admit: 2019-04-30 | Discharge: 2019-04-30 | Disposition: A | Discharge status: Home / Self Care | Payer: Medicare Other | Source: Ambulatory Visit | Attending: Radiation Oncology | Admitting: Radiation Oncology

## 2019-04-30 DIAGNOSIS — Z510 Encounter for antineoplastic radiation therapy: Secondary | ICD-10-CM | POA: Diagnosis not present

## 2019-05-01 ENCOUNTER — Ambulatory Visit
Admission: RE | Admit: 2019-05-01 | Discharge: 2019-05-01 | Disposition: A | Discharge status: Home / Self Care | Payer: Medicare Other | Source: Ambulatory Visit | Attending: Radiation Oncology | Admitting: Radiation Oncology

## 2019-05-01 ENCOUNTER — Other Ambulatory Visit: Payer: Self-pay

## 2019-05-01 DIAGNOSIS — Z510 Encounter for antineoplastic radiation therapy: Secondary | ICD-10-CM | POA: Diagnosis not present

## 2019-05-02 ENCOUNTER — Other Ambulatory Visit: Payer: Self-pay

## 2019-05-02 ENCOUNTER — Ambulatory Visit
Admission: RE | Admit: 2019-05-02 | Discharge: 2019-05-02 | Disposition: A | Discharge status: Home / Self Care | Payer: Medicare Other | Source: Ambulatory Visit | Attending: Radiation Oncology | Admitting: Radiation Oncology

## 2019-05-02 DIAGNOSIS — Z510 Encounter for antineoplastic radiation therapy: Secondary | ICD-10-CM | POA: Diagnosis not present

## 2019-05-05 ENCOUNTER — Other Ambulatory Visit: Payer: Self-pay

## 2019-05-05 ENCOUNTER — Ambulatory Visit
Admission: RE | Admit: 2019-05-05 | Discharge: 2019-05-05 | Disposition: A | Discharge status: Home / Self Care | Payer: Medicare Other | Source: Ambulatory Visit | Attending: Radiation Oncology | Admitting: Radiation Oncology

## 2019-05-05 DIAGNOSIS — K219 Gastro-esophageal reflux disease without esophagitis: Secondary | ICD-10-CM | POA: Insufficient documentation

## 2019-05-05 DIAGNOSIS — Z79899 Other long term (current) drug therapy: Secondary | ICD-10-CM | POA: Insufficient documentation

## 2019-05-05 DIAGNOSIS — Z87891 Personal history of nicotine dependence: Secondary | ICD-10-CM | POA: Insufficient documentation

## 2019-05-05 DIAGNOSIS — N189 Chronic kidney disease, unspecified: Secondary | ICD-10-CM | POA: Diagnosis not present

## 2019-05-05 DIAGNOSIS — C61 Malignant neoplasm of prostate: Secondary | ICD-10-CM | POA: Insufficient documentation

## 2019-05-05 DIAGNOSIS — Z510 Encounter for antineoplastic radiation therapy: Secondary | ICD-10-CM | POA: Insufficient documentation

## 2019-05-05 DIAGNOSIS — G4730 Sleep apnea, unspecified: Secondary | ICD-10-CM | POA: Insufficient documentation

## 2019-05-06 ENCOUNTER — Ambulatory Visit
Admission: RE | Admit: 2019-05-06 | Discharge: 2019-05-06 | Disposition: A | Discharge status: Home / Self Care | Payer: Medicare Other | Source: Ambulatory Visit | Attending: Radiation Oncology | Admitting: Radiation Oncology

## 2019-05-06 ENCOUNTER — Other Ambulatory Visit: Payer: Self-pay

## 2019-05-06 DIAGNOSIS — Z510 Encounter for antineoplastic radiation therapy: Secondary | ICD-10-CM | POA: Diagnosis not present

## 2019-06-05 ENCOUNTER — Other Ambulatory Visit: Payer: Self-pay

## 2019-06-09 ENCOUNTER — Other Ambulatory Visit: Payer: Self-pay | Admitting: *Deleted

## 2019-06-09 ENCOUNTER — Other Ambulatory Visit: Payer: Self-pay

## 2019-06-09 ENCOUNTER — Encounter: Payer: Self-pay | Admitting: Radiation Oncology

## 2019-06-09 ENCOUNTER — Ambulatory Visit
Admission: RE | Admit: 2019-06-09 | Discharge: 2019-06-09 | Disposition: A | Discharge status: Home / Self Care | Payer: Medicare Other | Source: Ambulatory Visit | Attending: Radiation Oncology | Admitting: Radiation Oncology

## 2019-06-09 DIAGNOSIS — Z923 Personal history of irradiation: Secondary | ICD-10-CM | POA: Diagnosis not present

## 2019-06-09 DIAGNOSIS — M810 Age-related osteoporosis without current pathological fracture: Secondary | ICD-10-CM | POA: Insufficient documentation

## 2019-06-09 DIAGNOSIS — C61 Malignant neoplasm of prostate: Secondary | ICD-10-CM

## 2019-06-09 NOTE — Progress Notes (Signed)
Radiation Oncology Follow up Note  Name: [name] [name]   Date:   06/09/2019 MRN:  [id number] DOB: May 12, 1941    This 78 y.o. male presents to the clinic today for 1 month follow-up status post radiation therapy to his prostate for Gleason 7 (3+4) presenting with a PSA of 30.Marland Kitchen  REFERRING PROVIDER: Baxter Hire, MD  HPI: Patient is a 78 year old male now 1 month out having completed radiation therapy for stage IIb mostly Gleason 7 (3+4) adenocarcinoma the prostate presenting with a PSA of 30.  He is seen today in routine follow-up and is doing well major complaint is some lower back pain he states started when he was laying on the treatment table.  He specifically denies any increased lower urinary tract symptoms or diarrhea..  Patient does have a history of degenerative disc disease as well as osteoporosis.  Patient did have some plain films of his spine showing arthritis no evidence of fracture.  COMPLICATIONS OF TREATMENT: none  FOLLOW UP COMPLIANCE: keeps appointments   PHYSICAL EXAM:  BP (!) (P) 151/52 (BP Location: Left Arm, Patient Position: Sitting)   Pulse (!) (P) 49   Temp (!) (P) 97.3 F (36.3 C) (Tympanic)   Resp (P) 16   Wt (P) 158 lb 1.1 oz (71.7 kg)   BMI (P) 23.34 kg/m  Well-developed well-nourished patient in NAD. HEENT reveals PERLA, EOMI, discs not visualized.  Oral cavity is clear. No oral mucosal lesions are identified. Neck is clear without evidence of cervical or supraclavicular adenopathy. Lungs are clear to A&P. Cardiac examination is essentially unremarkable with regular rate and rhythm without murmur rub or thrill. Abdomen is benign with no organomegaly or masses noted. Motor sensory and DTR levels are equal and symmetric in the upper and lower extremities. Cranial nerves II through XII are grossly intact. Proprioception is intact. No peripheral adenopathy or edema is identified. No motor or sensory levels are noted. Crude visual fields are within normal  range.  RADIOLOGY RESULTS: No current films to review  PLAN: Present time patient is doing well with low side effect profile from his radiation therapy.  I have suggested some ice at night as well as Advil for a couple weeks to try to alleviate some of his back pain.  Patient is also received instruction on physical therapy which I encouraged him to follow.  I have asked to see him back in 3 months for follow-up with a PSA at that time.  Patient knows to call with any concerns.  He is currently under Lupron therapy with urology.  I would like to take this opportunity to thank you for allowing me to participate in the care of your patient.Noreene Filbert, MD

## 2019-09-01 ENCOUNTER — Inpatient Hospital Stay: Payer: Medicare Other | Attending: Radiation Oncology

## 2019-09-01 ENCOUNTER — Other Ambulatory Visit: Payer: Self-pay

## 2019-09-01 DIAGNOSIS — C61 Malignant neoplasm of prostate: Secondary | ICD-10-CM | POA: Diagnosis not present

## 2019-09-01 LAB — PSA: Prostatic Specific Antigen: 0.06 ng/mL (ref 0.00–4.00)

## 2019-09-05 ENCOUNTER — Other Ambulatory Visit: Payer: Self-pay

## 2019-09-08 ENCOUNTER — Encounter: Payer: Self-pay | Admitting: Radiation Oncology

## 2019-09-08 ENCOUNTER — Ambulatory Visit
Admission: RE | Admit: 2019-09-08 | Discharge: 2019-09-08 | Disposition: A | Discharge status: Home / Self Care | Payer: Medicare Other | Source: Ambulatory Visit | Attending: Radiation Oncology | Admitting: Radiation Oncology

## 2019-09-08 ENCOUNTER — Other Ambulatory Visit: Payer: Self-pay

## 2019-09-08 VITALS — BP 157/54 | HR 41 | Temp 98.1°F

## 2019-09-08 DIAGNOSIS — Z923 Personal history of irradiation: Secondary | ICD-10-CM | POA: Diagnosis not present

## 2019-09-08 DIAGNOSIS — C61 Malignant neoplasm of prostate: Secondary | ICD-10-CM | POA: Diagnosis not present

## 2019-09-08 NOTE — Progress Notes (Signed)
Radiation Oncology Follow up Note  Name: [name] [name]   Date:   09/08/2019 MRN:  [id number] DOB: [birth year]    This 78 y.o. male presents to the clinic today for 57-month follow-up status post image guided IMRT radiation therapy for Gleason 7 (3+4) adenocarcinoma the prostate presenting with a PSA of 30.Marland Kitchen  REFERRING PROVIDER: Baxter Hire, MD  HPI: Patient is a 78 year old male now about 4 months having completed image guided IMRT radiation therapy to his prostate for Gleason 7 (3+4) adenocarcinoma.  He also has been on androgen deprivation therapy.  He is seen today in routine follow-up is doing well specifically denies any increased lower urinary tract symptoms diarrhea or fatigue.Marland Kitchen  His current PSA is 8.91.  COMPLICATIONS OF TREATMENT: none  FOLLOW UP COMPLIANCE: keeps appointments   PHYSICAL EXAM:  BP (!) 157/54   Pulse (!) 41   Temp 98.1 F (36.7 C)  Well-developed well-nourished patient in NAD. HEENT reveals PERLA, EOMI, discs not visualized.  Oral cavity is clear. No oral mucosal lesions are identified. Neck is clear without evidence of cervical or supraclavicular adenopathy. Lungs are clear to A&P. Cardiac examination is essentially unremarkable with regular rate and rhythm without murmur rub or thrill. Abdomen is benign with no organomegaly or masses noted. Motor sensory and DTR levels are equal and symmetric in the upper and lower extremities. Cranial nerves II through XII are grossly intact. Proprioception is intact. No peripheral adenopathy or edema is identified. No motor or sensory levels are noted. Crude visual fields are within normal range.  RADIOLOGY RESULTS: No current films for review  PLAN: Present time patient is under excellent biochemical control of his prostate cancer.  He will continue on androgen deprivation therapy.  I have asked to see him back in 6 months for follow-up with a PSA prior to that visit.  Patient knows to call sooner with any concerns.  ADT  therapy is being provided through urology.  I would like to take this opportunity to thank you for allowing me to participate in the care of your patient.Noreene Filbert, MD

## 2019-09-16 ENCOUNTER — Ambulatory Visit (INDEPENDENT_AMBULATORY_CARE_PROVIDER_SITE_OTHER): Payer: Medicare Other

## 2019-09-16 ENCOUNTER — Other Ambulatory Visit: Payer: Self-pay

## 2019-09-16 DIAGNOSIS — C61 Malignant neoplasm of prostate: Secondary | ICD-10-CM

## 2019-09-16 MED ORDER — LEUPROLIDE ACETATE (6 MONTH) 45 MG ~~LOC~~ KIT
45.0000 mg | PACK | Freq: Once | SUBCUTANEOUS | Status: AC
  Administered 2019-09-16: 45 mg via SUBCUTANEOUS

## 2019-09-16 NOTE — Progress Notes (Addendum)
Eligard SubQ Injection   Due to Prostate Cancer patient is present today for a Eligard Injection.  Medication: Eligard 6 month Dose: 45 mg  Location: right  Lot: [id number] Exp: 110034  Patient tolerated well, no complications were noted  Performed by: Gaspar Cola CMA  Follow up: Return in 6 months.

## 2019-09-17 ENCOUNTER — Ambulatory Visit: Payer: Medicare Other

## 2020-01-21 DIAGNOSIS — R809 Proteinuria, unspecified: Secondary | ICD-10-CM | POA: Insufficient documentation

## 2020-01-21 DIAGNOSIS — I129 Hypertensive chronic kidney disease with stage 1 through stage 4 chronic kidney disease, or unspecified chronic kidney disease: Secondary | ICD-10-CM | POA: Insufficient documentation

## 2020-01-21 DIAGNOSIS — N1832 Chronic kidney disease, stage 3b: Secondary | ICD-10-CM | POA: Insufficient documentation

## 2020-02-02 ENCOUNTER — Encounter: Payer: Self-pay | Admitting: Radiation Oncology

## 2020-03-15 ENCOUNTER — Inpatient Hospital Stay: Payer: Medicare Other | Attending: Radiation Oncology

## 2020-03-15 ENCOUNTER — Other Ambulatory Visit: Payer: Self-pay

## 2020-03-15 DIAGNOSIS — C61 Malignant neoplasm of prostate: Secondary | ICD-10-CM | POA: Insufficient documentation

## 2020-03-15 LAB — PSA: Prostatic Specific Antigen: <0.01 ng/mL (ref 0.00–4.00)

## 2020-03-16 ENCOUNTER — Ambulatory Visit (INDEPENDENT_AMBULATORY_CARE_PROVIDER_SITE_OTHER): Payer: Medicare Other

## 2020-03-16 DIAGNOSIS — C61 Malignant neoplasm of prostate: Secondary | ICD-10-CM

## 2020-03-16 MED ORDER — LEUPROLIDE ACETATE (6 MONTH) 45 MG ~~LOC~~ KIT
45.0000 mg | PACK | Freq: Once | SUBCUTANEOUS | Status: AC
  Administered 2020-03-16: 09:00:00 45 mg via SUBCUTANEOUS

## 2020-03-16 NOTE — Progress Notes (Signed)
Eligard SubQ Injection   Due to Prostate Cancer patient is present today for a Eligard Injection.  Medication: Eligard 6 month Dose: 45 mg  Location: right   Lot: [id number] Exp: 10/2021  Patient tolerated well, no complications were noted  Performed by: Bradly Bienenstock, CMA  Follow up: Return in 6 mos (Oct 5th)

## 2020-03-19 ENCOUNTER — Other Ambulatory Visit: Payer: Self-pay

## 2020-03-22 ENCOUNTER — Other Ambulatory Visit: Payer: Self-pay

## 2020-03-22 ENCOUNTER — Ambulatory Visit
Admission: RE | Admit: 2020-03-22 | Discharge: 2020-03-22 | Disposition: A | Discharge status: Home / Self Care | Payer: Medicare Other | Source: Ambulatory Visit | Attending: Radiation Oncology | Admitting: Radiation Oncology

## 2020-03-22 VITALS — BP 134/54 | HR 46 | Temp 96.3°F | Resp 18 | Wt 157.4 lb

## 2020-03-22 DIAGNOSIS — Z923 Personal history of irradiation: Secondary | ICD-10-CM | POA: Diagnosis not present

## 2020-03-22 DIAGNOSIS — C61 Malignant neoplasm of prostate: Secondary | ICD-10-CM | POA: Insufficient documentation

## 2020-03-22 DIAGNOSIS — R9721 Rising PSA following treatment for malignant neoplasm of prostate: Secondary | ICD-10-CM | POA: Diagnosis not present

## 2020-03-22 NOTE — Progress Notes (Signed)
Radiation Oncology Follow up Note  Name: [name] [name]   Date:   03/22/2020 MRN:  [id number] DOB: 09-Aug-1941    This 79 y.o. male presents to the clinic today for 63-month follow-up status post IMRT radiation therapy for Gleason 7 (3+4) adenocarcinoma prostate presenting with a PSA of 30.Marland Kitchen  REFERRING PROVIDER: Baxter Hire, MD  HPI: Patient is an 79 year old male now seen out 10 months having completed IMRT radiation therapy along with ADT therapy for Gleason 7 (3+4) adenocarcinoma the prostate presenting with a PSA of 30.  He continues on androgen deprivation therapy he is seen today for routine follow-up is doing well states he does have some increased bowel movements not specifically diarrhea most likely diet dependent.  He states he is not having any significant lower urinary tract symptoms.  His most recent PSA is less than 1.21.  COMPLICATIONS OF TREATMENT: none  FOLLOW UP COMPLIANCE: keeps appointments   PHYSICAL EXAM:  BP (!) 134/54 (BP Location: Left Arm, Patient Position: Sitting)   Pulse (!) 46   Temp (!) 96.3 F (35.7 C) (Tympanic)   Resp 18   Wt 157 lb 6.4 oz (71.4 kg)   BMI 23.24 kg/m  Well-developed well-nourished patient in NAD. HEENT reveals PERLA, EOMI, discs not visualized.  Oral cavity is clear. No oral mucosal lesions are identified. Neck is clear without evidence of cervical or supraclavicular adenopathy. Lungs are clear to A&P. Cardiac examination is essentially unremarkable with regular rate and rhythm without murmur rub or thrill. Abdomen is benign with no organomegaly or masses noted. Motor sensory and DTR levels are equal and symmetric in the upper and lower extremities. Cranial nerves II through XII are grossly intact. Proprioception is intact. No peripheral adenopathy or edema is identified. No motor or sensory levels are noted. Crude visual fields are within normal range.  RADIOLOGY RESULTS: No current films for review  PLAN: Present time patient is doing  well under excellent biochemical control of his prostate cancer he will have 1 more Lupron injections in approximately 6 months.  I am otherwise pleased with his overall progress he states he is having some hot flashes I suggested some vitamin D supplements.  We will see him back in 6 months with a PSA prior to that visit.  Patient knows to call sooner with any concerns.  I would like to take this opportunity to thank you for allowing me to participate in the care of your patient.Noreene Filbert, MD

## 2020-06-08 ENCOUNTER — Telehealth: Payer: Self-pay

## 2020-06-08 NOTE — Telephone Encounter (Signed)
No PA required for Eligard.  

## 2020-09-07 ENCOUNTER — Ambulatory Visit: Payer: Self-pay

## 2020-09-10 ENCOUNTER — Other Ambulatory Visit: Payer: Self-pay

## 2020-09-10 ENCOUNTER — Ambulatory Visit (INDEPENDENT_AMBULATORY_CARE_PROVIDER_SITE_OTHER): Payer: Medicare Other

## 2020-09-10 ENCOUNTER — Ambulatory Visit: Payer: Medicare Other

## 2020-09-10 ENCOUNTER — Telehealth: Payer: Self-pay

## 2020-09-10 DIAGNOSIS — C61 Malignant neoplasm of prostate: Secondary | ICD-10-CM | POA: Diagnosis not present

## 2020-09-10 MED ORDER — LEUPROLIDE ACETATE (6 MONTH) 45 MG ~~LOC~~ KIT
45.0000 mg | PACK | Freq: Once | SUBCUTANEOUS | Status: AC
  Administered 2020-09-10: 45 mg via SUBCUTANEOUS

## 2020-09-10 NOTE — Patient Instructions (Signed)

## 2020-09-10 NOTE — Telephone Encounter (Signed)
Called pt no answer. LM for pt informing him of the need to move appointment as we have not received delivery of Eligard for the day, in addition pt has not been seen by provider in a year and needs to be seen for office visit. Appt moved.

## 2020-09-10 NOTE — Progress Notes (Signed)
Eligard SubQ Injection   Due to Prostate Cancer patient is present today for a Eligard Injection.  Medication: Eligard 6 month Dose: 45 mg  Location: right  Lot: [id number] Exp: 01/04/2022  Patient tolerated well, no complications were noted.  Performed by: Gordy Clement, [place]   Per Dr.Chrystal this is patient's last Eligard injection. PA approval dates: No PA required

## 2020-09-13 ENCOUNTER — Inpatient Hospital Stay: Payer: Medicare Other | Attending: Radiation Oncology

## 2020-09-13 ENCOUNTER — Ambulatory Visit: Payer: Medicare Other

## 2020-09-13 ENCOUNTER — Ambulatory Visit: Payer: Self-pay

## 2020-09-13 ENCOUNTER — Other Ambulatory Visit: Payer: Self-pay

## 2020-09-13 ENCOUNTER — Other Ambulatory Visit: Payer: Medicare Other

## 2020-09-13 DIAGNOSIS — C61 Malignant neoplasm of prostate: Secondary | ICD-10-CM | POA: Insufficient documentation

## 2020-09-13 LAB — PSA: Prostatic Specific Antigen: <0.01 ng/mL (ref 0.00–4.00)

## 2020-09-20 ENCOUNTER — Other Ambulatory Visit: Payer: Self-pay

## 2020-09-20 ENCOUNTER — Ambulatory Visit
Admission: RE | Admit: 2020-09-20 | Discharge: 2020-09-20 | Disposition: A | Discharge status: Home / Self Care | Payer: Medicare Other | Source: Ambulatory Visit | Attending: Radiation Oncology | Admitting: Radiation Oncology

## 2020-09-20 ENCOUNTER — Encounter: Payer: Self-pay | Admitting: Radiation Oncology

## 2020-09-20 VITALS — BP 141/53 | HR 45 | Temp 96.2°F | Wt 154.0 lb

## 2020-09-20 DIAGNOSIS — R972 Elevated prostate specific antigen [PSA]: Secondary | ICD-10-CM

## 2020-09-20 NOTE — Progress Notes (Signed)
Radiation Oncology Follow up Note  Name: [name] [name]   Date:   09/20/2020 MRN:  [id number] DOB: Sep 03, 1941    This 79 y.o. male presents to the clinic for 46-month follow-up status post IMRT radiation therapy to his prostate for Gleason 7 (3+4) adenocarcinoma presenting with a PSA of 30  REFERRING PROVIDER: Baxter Hire, MD  HPI: Patient is an 79 year old male now out 14 months having completed radiation therapy to his prostate and pelvic nodes for Gleason 7 (3+4) adenocarcinoma presenting with a PSA of 30.  He is seen today in routine follow-up is doing well he specifically denies any increased lower urinary tract symptoms diarrhea or fatigue.  He has been on androgen deprivation therapy can his PSA most recently is less than 0.01.Marland Kitchen  Again he is currently on Eligard  COMPLICATIONS OF TREATMENT: none  FOLLOW UP COMPLIANCE: keeps appointments   PHYSICAL EXAM:  BP (!) 141/53 (BP Location: Left Arm, Patient Position: Sitting, Cuff Size: Normal)   Pulse (!) 45   Temp (!) 96.2 F (35.7 C) (Tympanic)   Wt 154 lb (69.9 kg)   BMI 22.74 kg/m  Well-developed well-nourished patient in NAD. HEENT reveals PERLA, EOMI, discs not visualized.  Oral cavity is clear. No oral mucosal lesions are identified. Neck is clear without evidence of cervical or supraclavicular adenopathy. Lungs are clear to A&P. Cardiac examination is essentially unremarkable with regular rate and rhythm without murmur rub or thrill. Abdomen is benign with no organomegaly or masses noted. Motor sensory and DTR levels are equal and symmetric in the upper and lower extremities. Cranial nerves II through XII are grossly intact. Proprioception is intact. No peripheral adenopathy or edema is identified. No motor or sensory levels are noted. Crude visual fields are within normal range.  RADIOLOGY RESULTS: No current films to review  PLAN: Present time patient is under excellent biochemical control of his prostate cancer.  He  continues on Eligard at this time.  I have asked to see him back in 1 year for follow-up.  Patient knows to call sooner with any concerns.  I would like to take this opportunity to thank you for allowing me to participate in the care of your patient.Noreene Filbert, MD

## 2020-09-22 ENCOUNTER — Ambulatory Visit: Payer: Self-pay | Admitting: Urology

## 2020-09-23 NOTE — Progress Notes (Signed)
09/24/2020 8:48 AM   [name] [name] 27-Nov-1941 [id number]  Referring provider: Baxter Hire, MD [place],  [place] [address] Chief Complaint  Patient presents with  . Prostate Cancer    Urologic history: 1.  Prostate cancer -T2b intermediate (unfavorable) risk prostate cancer -PSA 3.5; firm left prostate -Prostate biopsy 01/17/2019 -37 g prostate -6/6 left-sided cores positive Gleason 3+3/3+/4+3 -Treated IMRT + ADT -Initial leuprolide injection 03/2019; last leuprolide 09/2020 -Completed IMRT 05/06/2019  HPI: [name] [name] is a 79 y.o. male who presents today for follow up of prostate cancer.   - Clinical T2b Nx Mx adenocarcinoma prostate -TRUS 37 g prostate; hypoechoic left PZ -Standard 12 core biopsy performed -Last Eligard 6 month 45 mg injection was on 09/10/2020.  -Most recent PSA is undetectable as of 09/13/2020.  -Patient is doing well today. -Denies bothersome urinary symptoms.   PSA trend: Component     Latest Ref Rng & Units 09/01/2019 03/15/2020 09/13/2020  Prostatic Specific Antigen     0.00 - 4.00 ng/mL 0.06 <0.01 <0.01     PMH: Past Medical History:  Diagnosis Date  . Age related osteoporosis   . Barrett's esophagus   . Cataract   . CKD (chronic kidney disease)   . DDD (degenerative disc disease), lumbar   . Femur fracture (HCC)    Right  . GERD (gastroesophageal reflux disease)   . Hematuria   . History of colon polyps   . Osteopenia of the elderly   . Sleep apnea     Surgical History: Past Surgical History:  Procedure Laterality Date  . BAND HEMORRHOIDECTOMY    . COLONOSCOPY    . COLONOSCOPY WITH PROPOFOL N/A 05/06/2018   Procedure: COLONOSCOPY WITH PROPOFOL;  Surgeon: Lollie Sails, MD;  Location[facility] ENDOSCOPY;  Service: Endoscopy;  Laterality: N/A;  . COLONOSCOPY WITH PROPOFOL N/A 09/23/2018   Procedure: COLONOSCOPY WITH PROPOFOL;  Surgeon: Lollie Sails, MD;  Location: [facility] ENDOSCOPY;  Service: Endoscopy;   Laterality: N/A;  . ESOPHAGOGASTRODUODENOSCOPY (EGD) WITH PROPOFOL N/A 10/03/2016   Procedure: ESOPHAGOGASTRODUODENOSCOPY (EGD) WITH PROPOFOL;  Surgeon: Lollie Sails, MD;  Location: [facility] ENDOSCOPY;  Service: Endoscopy;  Laterality: N/A;  . EYE SURGERY    . FRACTURE SURGERY Right 2016   femur  . ORIF DISTAL FEMUR FRACTURE    . TONSILLECTOMY      Home Medications:  Allergies as of 09/24/2020      Reactions   Demerol [meperidine] Nausea And Vomiting, Other (See Comments)   Patient passed out.   Nsaids Other (See Comments)      Medication List       Accurate as of September 24, 2020  8:48 AM. If you have any questions, ask your nurse or doctor.        Fluad 0.5 ML Susy Generic drug: Influenza Vac A&B Surf Ant Adj ADM 0.5ML IM UTD   Influenza vac split quadrivalent PF 0.5 ML injection Commonly known as: FLUZONE HIGH-DOSE Fluzone High-Dose 2017-2018 (PF) 180 mcg/0.5 mL intramuscular syringe   ipratropium 0.03 % nasal spray Commonly known as: ATROVENT Place 1 spray into both nostrils 2 (two) times daily.   multivitamin tablet Take 1 tablet by mouth 2 (two) times daily.   omeprazole 20 MG capsule Commonly known as: PRILOSEC Take 20 mg by mouth daily.   Pneumovax 23 25 MCG/0.5ML injection Generic drug: pneumococcal 23 valent vaccine Pneumovax 23 25 mcg/0.5 mL injection syringe   traZODone 50 MG tablet Commonly known as: DESYREL  Take 50 mg by mouth at bedtime.       Allergies:  Allergies  Allergen Reactions  . Demerol [Meperidine] Nausea And Vomiting and Other (See Comments)    Patient passed out.  . Nsaids Other (See Comments)    Family History: No family history on file.  Social History:  reports that he quit smoking about 46 years ago. His smoking use included cigarettes. He has never used smokeless tobacco. He reports previous alcohol use of about 1.0 standard drink of alcohol per week. He reports that he does not use drugs.   Physical Exam: BP  128/70   Pulse 74   Ht 5\' 8"  (1.727 m)   Wt 150 lb (68 kg)   BMI 22.81 kg/m   Constitutional:  Alert and oriented, No acute distress. HEENT: [place] AT, moist mucus membranes.  Trachea midline, no masses. Cardiovascular: No clubbing, cyanosis, or edema. Respiratory: Normal respiratory effort, no increased work of breathing. Skin: No rashes, bruises or suspicious lesions. Neurologic: Grossly intact, no focal deficits, moving all 4 extremities. Psychiatric: Normal mood and affect.    Assessment & Plan:    1. Clinical T2b Nx Mx adenocarcinoma prostate -Intermediate unfavorable risk prostate cancer (NCCN)  -Doing well s/p IMRT + ADT therapy.  -Last Eligard 45 mg injection was on 09/10/2020. -No additional leuprolide injections -Most recent PSA is undetectable as of 09/13/2020.  -RTC in 6 months for PSA only. -Radiation oncology follow-up scheduled 1 year  [place] [address] [place], [place] [address] [phone]  I, Selena Batten, am acting as a scribe for Dr. Nicki Reaper C. [place] Perry,  I have reviewed the above documentation for accuracy and completeness, and I agree with the above.   Abbie Sons, MD

## 2020-09-24 ENCOUNTER — Encounter: Payer: Self-pay | Admitting: Urology

## 2020-09-24 ENCOUNTER — Ambulatory Visit (INDEPENDENT_AMBULATORY_CARE_PROVIDER_SITE_OTHER): Payer: Medicare Other | Admitting: Urology

## 2020-09-24 ENCOUNTER — Other Ambulatory Visit: Payer: Self-pay

## 2020-09-24 VITALS — BP 128/70 | HR 74 | Ht 68.0 in | Wt 150.0 lb

## 2020-09-24 DIAGNOSIS — C61 Malignant neoplasm of prostate: Secondary | ICD-10-CM | POA: Diagnosis not present

## 2020-09-30 DIAGNOSIS — I491 Atrial premature depolarization: Secondary | ICD-10-CM | POA: Insufficient documentation

## 2021-03-25 ENCOUNTER — Other Ambulatory Visit: Payer: Self-pay

## 2021-03-25 ENCOUNTER — Other Ambulatory Visit: Payer: Medicare Other

## 2021-03-25 DIAGNOSIS — C61 Malignant neoplasm of prostate: Secondary | ICD-10-CM

## 2021-03-26 LAB — PSA: Prostate Specific Ag, Serum: <0.1 ng/mL (ref 0.0–4.0)

## 2021-03-28 ENCOUNTER — Encounter: Payer: Self-pay | Admitting: *Deleted

## 2021-03-29 ENCOUNTER — Encounter: Payer: Self-pay | Admitting: Internal Medicine

## 2021-03-30 ENCOUNTER — Ambulatory Visit: Payer: Medicare Other | Admitting: Registered Nurse

## 2021-03-30 ENCOUNTER — Encounter
Admission: RE | Disposition: A | Discharge status: Home / Self Care | Payer: Self-pay | Source: Home / Self Care | Attending: Internal Medicine

## 2021-03-30 ENCOUNTER — Other Ambulatory Visit: Payer: Self-pay

## 2021-03-30 ENCOUNTER — Ambulatory Visit
Admission: RE | Admit: 2021-03-30 | Discharge: 2021-03-30 | Disposition: A | Discharge status: Home / Self Care | Payer: Medicare Other | Attending: Internal Medicine | Admitting: Internal Medicine

## 2021-03-30 ENCOUNTER — Encounter: Payer: Self-pay | Admitting: Internal Medicine

## 2021-03-30 DIAGNOSIS — Y842 Radiological procedure and radiotherapy as the cause of abnormal reaction of the patient, or of later complication, without mention of misadventure at the time of the procedure: Secondary | ICD-10-CM | POA: Insufficient documentation

## 2021-03-30 DIAGNOSIS — K635 Polyp of colon: Secondary | ICD-10-CM | POA: Insufficient documentation

## 2021-03-30 DIAGNOSIS — Z79899 Other long term (current) drug therapy: Secondary | ICD-10-CM | POA: Diagnosis not present

## 2021-03-30 DIAGNOSIS — K627 Radiation proctitis: Secondary | ICD-10-CM | POA: Diagnosis not present

## 2021-03-30 DIAGNOSIS — Z87891 Personal history of nicotine dependence: Secondary | ICD-10-CM | POA: Insufficient documentation

## 2021-03-30 DIAGNOSIS — Z85828 Personal history of other malignant neoplasm of skin: Secondary | ICD-10-CM | POA: Insufficient documentation

## 2021-03-30 DIAGNOSIS — K640 First degree hemorrhoids: Secondary | ICD-10-CM | POA: Insufficient documentation

## 2021-03-30 DIAGNOSIS — D123 Benign neoplasm of transverse colon: Secondary | ICD-10-CM | POA: Insufficient documentation

## 2021-03-30 DIAGNOSIS — D126 Benign neoplasm of colon, unspecified: Secondary | ICD-10-CM | POA: Diagnosis present

## 2021-03-30 HISTORY — DX: Low back pain, unspecified: M54.50

## 2021-03-30 HISTORY — DX: Unspecified malignant neoplasm of skin, unspecified: C44.90

## 2021-03-30 HISTORY — DX: Bradycardia, unspecified: R00.1

## 2021-03-30 HISTORY — DX: Polyp of colon: K63.5

## 2021-03-30 HISTORY — DX: Malignant (primary) neoplasm, unspecified: C80.1

## 2021-03-30 HISTORY — PX: COLONOSCOPY WITH PROPOFOL: SHX5780

## 2021-03-30 HISTORY — DX: Polyp of stomach and duodenum: K31.7

## 2021-03-30 HISTORY — DX: Other cervical disc degeneration, unspecified cervical region: M50.30

## 2021-03-30 HISTORY — DX: Benign neoplasm of stomach: D13.1

## 2021-03-30 HISTORY — DX: Insomnia, unspecified: G47.00

## 2021-03-30 HISTORY — DX: Gastritis, unspecified, without bleeding: K29.70

## 2021-03-30 HISTORY — DX: Hemorrhage of anus and rectum: K62.5

## 2021-03-30 HISTORY — DX: Age-related osteoporosis without current pathological fracture: M81.0

## 2021-03-30 SURGERY — COLONOSCOPY WITH PROPOFOL
Anesthesia: General

## 2021-03-30 MED ORDER — PROPOFOL 10 MG/ML IV BOLUS
INTRAVENOUS | Status: DC | PRN
  Administered 2021-03-30: 50 mg via INTRAVENOUS
  Administered 2021-03-30 (×2): 10 mg via INTRAVENOUS

## 2021-03-30 MED ORDER — SODIUM CHLORIDE 0.9 % IV SOLN: INTRAVENOUS | Status: DC

## 2021-03-30 MED ORDER — LIDOCAINE HCL (CARDIAC) PF 100 MG/5ML IV SOSY
PREFILLED_SYRINGE | INTRAVENOUS | Status: DC | PRN
  Administered 2021-03-30: 80 mg via INTRAVENOUS

## 2021-03-30 MED ORDER — PROPOFOL 500 MG/50ML IV EMUL
INTRAVENOUS | Status: DC | PRN
  Administered 2021-03-30: 125 ug/kg/min via INTRAVENOUS

## 2021-03-30 NOTE — Interval H&P Note (Signed)
History and Physical Interval Note:  03/30/2021 9:04 AM  [name] [name]  has presented today for surgery, with the diagnosis of PRS HX COLON POLYP.  The various methods of treatment have been discussed with the patient and family. After consideration of risks, benefits and other options for treatment, the patient has consented to  Procedure(s): COLONOSCOPY WITH PROPOFOL (N/A) as a surgical intervention.  The patient's history has been reviewed, patient examined, no change in status, stable for surgery.  I have reviewed the patient's chart and labs.  Questions were answered to the patient's satisfaction.     [place], [place]

## 2021-03-30 NOTE — Anesthesia Postprocedure Evaluation (Signed)
Anesthesia Post Note  Patient: [name] [name]  Procedure(s) Performed: COLONOSCOPY WITH PROPOFOL (N/A )  Patient location during evaluation: Endoscopy Anesthesia Type: General Level of consciousness: awake and alert Pain management: pain level controlled Vital Signs Assessment: post-procedure vital signs reviewed and stable Respiratory status: spontaneous breathing, nonlabored ventilation, respiratory function stable and patient connected to nasal cannula oxygen Cardiovascular status: blood pressure returned to baseline and stable Postop Assessment: no apparent nausea or vomiting Anesthetic complications: no   No complications documented.   Last Vitals:  Vitals:   03/30/21 1031 03/30/21 1046  BP: 118/61 128/84  Pulse: (!) 50 62  Resp: 15 16  Temp:    SpO2: 99% 92%    Last Pain:  Vitals:   03/30/21 1016  TempSrc: Temporal  PainSc:                  Precious Haws Keshawn Sundberg

## 2021-03-30 NOTE — H&P (Signed)
Outpatient short stay form Pre-procedure 03/30/2021 9:03 AM [name] [name] K. Alice Reichert, M.D.  Primary Physician: Harrel Lemon, MD  Reason for visit: Personal history of adenomatous colon polyps.  History of present illness:                            Patient presents for colonoscopy for a personal hx of colon polyps. The patient denies abdominal pain, abnormal weight loss or rectal bleeding.  In particular, the patient had incomplete removal of hepatic flexure polyp, the site of which was tattooed in June 2019.  It was reinspected by Dr. Gustavo Lah and October 2019 and noted to have no residual polyp.  This may require reexamination.  2 other adenomas in October 2019 were also removed.   No current facility-administered medications for this encounter.  Medications Prior to Admission  Medication Sig Dispense Refill Last Dose  . calcium-vitamin D (OSCAL WITH D) 500-200 MG-UNIT tablet Take 1 tablet by mouth.     Marland Kitchen FLUAD 0.5 ML SUSY ADM 0.5ML IM UTD  0   . Influenza vac split quadrivalent PF (FLUZONE HIGH-DOSE) 0.5 ML injection Fluzone High-Dose 2017-2018 (PF) 180 mcg/0.5 mL intramuscular syringe     . ipratropium (ATROVENT) 0.03 % nasal spray Place 1 spray into both nostrils 2 (two) times daily.     . Multiple Vitamin (MULTIVITAMIN) tablet Take 1 tablet by mouth 2 (two) times daily.     Marland Kitchen omeprazole (PRILOSEC) 20 MG capsule Take 20 mg by mouth daily.     . pneumococcal 23 valent vaccine (PNEUMOVAX 23) 25 MCG/0.5ML injection Pneumovax 23 25 mcg/0.5 mL injection syringe     . traZODone (DESYREL) 50 MG tablet Take 50 mg by mouth at bedtime.        Allergies  Allergen Reactions  . Demerol [Meperidine] Nausea And Vomiting and Other (See Comments)    Patient passed out.  . Nsaids Other (See Comments)     Past Medical History:  Diagnosis Date  . Age related osteoporosis   . Barrett's esophagus   . Bradycardia   . Cancer ([place])    PROSTATE  . Cataract   . CKD (chronic kidney disease)   . Colon  polyps   . DDD (degenerative disc disease), cervical   . DDD (degenerative disc disease), lumbar   . Femur fracture (HCC)    Right  . Fundic gland polyposis of stomach   . Fundic gland polyps of stomach, benign   . Gastritis   . GERD (gastroesophageal reflux disease)   . Hematuria   . Hemorrhage of rectum and anus   . History of colon polyps   . Insomnia   . Lumbago   . Osteopenia of the elderly   . Osteoporosis   . Skin cancer   . Sleep apnea     Review of systems:  Otherwise negative.    Physical Exam  Gen: Alert, oriented. Appears stated age.  HEENT: [place]/AT. PERRLA. Lungs: CTA, no wheezes. CV: RR nl S1, S2. Abd: soft, benign, no masses. BS+ Ext: No edema. Pulses 2+    Planned procedures: Proceed with colonoscopy. The patient understands the nature of the planned procedure, indications, risks, alternatives and potential complications including but not limited to bleeding, infection, perforation, damage to internal organs and possible oversedation/side effects from anesthesia. The patient agrees and gives consent to proceed.  Please refer to procedure notes for findings, recommendations and patient disposition/instructions.     Corrin Hingle K. Alice Reichert, M.D.  Gastroenterology 03/30/2021  9:03 AM

## 2021-03-30 NOTE — Transfer of Care (Signed)
Immediate Anesthesia Transfer of Care Note  Patient: [name] [name]  Procedure(s) Performed: COLONOSCOPY WITH PROPOFOL (N/A )  Patient Location: Endoscopy Unit  Anesthesia Type:General  Level of Consciousness: drowsy  Airway & Oxygen Therapy: Patient Spontanous Breathing  Post-op Assessment: Report given to RN and Post -op Vital signs reviewed and stable  Post vital signs: Reviewed and stable  Last Vitals:  Vitals Value Taken Time  BP 98/53 03/30/21 1025  Temp    Pulse 48 03/30/21 1026  Resp 12 03/30/21 1026  SpO2 98 % 03/30/21 1026  Vitals shown include unvalidated device data.  Last Pain:  Vitals:   03/30/21 0916  TempSrc: Temporal  PainSc: 0-No pain         Complications: No complications documented.

## 2021-03-30 NOTE — Op Note (Signed)
[facility] Gastroenterology Patient Name: [name] [name] Procedure Date: 03/30/2021 9:50 AM MRN: [id number] Account #: [id number] Date of Birth: 07-24-1941 Admit Type: Outpatient Age: 80 Room: [facility] ENDO ROOM 2 Gender: Male Note Status: Finalized Procedure:             Colonoscopy Indications:           Surveillance: Incomplete removal of large sessile                         adenoma last colonoscopy (<3 yrs) Providers:             Benay Pike. Donnald Tabar MD, MD Medicines:             Propofol per Anesthesia Complications:         No immediate complications. Estimated blood loss:                         Minimal. Procedure:             Pre-Anesthesia Assessment:                        - The risks and benefits of the procedure and the                         sedation options and risks were discussed with the                         patient. All questions were answered and informed                         consent was obtained.                        - Patient identification and proposed procedure were                         verified prior to the procedure by the nurse. The                         procedure was verified in the procedure room.                        - ASA Grade Assessment: III - A patient with severe                         systemic disease.                        - After reviewing the risks and benefits, the patient                         was deemed in satisfactory condition to undergo the                         procedure.                        After obtaining informed consent, the colonoscope was  passed under direct vision. Throughout the procedure,                         the patient's blood pressure, pulse, and oxygen                         saturations were monitored continuously. The                         Colonoscope was introduced through the anus and                         advanced to the the cecum, identified by  appendiceal                         orifice and ileocecal valve. The colonoscopy was                         performed without difficulty. The patient tolerated                         the procedure well. The quality of the bowel                         preparation was good. The ileocecal valve, appendiceal                         orifice, and rectum were photographed. Findings:      The perianal and digital rectal examinations were normal. Pertinent       negatives include normal sphincter tone and no palpable rectal lesions.      Non-bleeding internal hemorrhoids were found during retroflexion. The       hemorrhoids were Grade I (internal hemorrhoids that do not prolapse).      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      A tattoo was seen at the hepatic flexure. A post-polypectomy scar was       found at the tattoo site.      A 6 mm polyp was found in the hepatic flexure. The polyp was sessile.       The polyp was removed with a jumbo cold forceps. Resection and retrieval       were complete. To prevent bleeding after the polypectomy, one hemostatic       clip was successfully placed (MR conditional). There was no bleeding at       the end of the procedure.      A 5 mm polyp was found in the transverse colon. The polyp was sessile.       The polyp was removed with a hot snare. Polyp resection was incomplete,       and the resected tissue was not retrieved.      A 3 mm polyp was found in the transverse colon. The polyp was sessile.       The polyp was removed with a jumbo cold forceps. Resection and retrieval       were complete.      The exam was otherwise without abnormality. Impression:            - Non-bleeding internal hemorrhoids.                        -  A tattoo was seen at the hepatic flexure. A                         post-polypectomy scar was found at the tattoo site.                        - One 6 mm polyp at  the hepatic flexure, removed with                         a jumbo cold forceps. Resected and retrieved. Clip (MR                         conditional) was placed.                        - One 5 mm polyp in the transverse colon, removed with                         a hot snare. Incomplete resection. Resected tissue not                         retrieved.                        - One 3 mm polyp in the transverse colon, removed with                         a jumbo cold forceps. Resected and retrieved.                        - The examination was otherwise normal. Recommendation:        - Patient has a contact number available for                         emergencies. The signs and symptoms of potential                         delayed complications were discussed with the patient.                         Return to normal activities tomorrow. Written                         discharge instructions were provided to the patient.                        - Resume previous diet.                        - Continue present medications.                        - If polyps are benign or adenomatous without                         dysplasia, I will advise NO further colonoscopy or  screening measures due to advanced age and/or severe                         comorbidity.                        - Return to GI office PRN.                        - The findings and recommendations were discussed with                         the patient. Procedure Code(s):     --- Professional ---                        [phone], Colonoscopy, flexible; with removal of                         tumor(s), polyp(s), or other lesion(s) by snare                         technique                        45380, 66, Colonoscopy, flexible; with biopsy, single                         or multiple Diagnosis Code(s):     --- Professional ---                        K63.5, Polyp of colon                        K64.0, First  degree hemorrhoids                        Z86.010, Personal history of colonic polyps CPT copyright 2019 American Medical Association. All rights reserved. The codes documented in this report are preliminary and upon coder review may  be revised to meet current compliance requirements. Efrain Sella MD, MD 03/30/2021 10:29:29 AM This report has been signed electronically. Number of Addenda: 0 Note Initiated On: 03/30/2021 9:50 AM Scope Withdrawal Time: 0 hours 16 minutes 21 seconds  Total Procedure Duration: 0 hours 21 minutes 6 seconds  Estimated Blood Loss:  Estimated blood loss was minimal.      [facility]

## 2021-03-30 NOTE — Anesthesia Preprocedure Evaluation (Signed)
Anesthesia Evaluation  Patient identified by MRN, date of birth, ID band Patient awake    Reviewed: Allergy & Precautions, H&P , NPO status , Patient's Chart, lab work & pertinent test results  History of Anesthesia Complications Negative for: history of anesthetic complications  Airway Mallampati: III  TM Distance: <3 FB Neck ROM: limited    Dental  (+) Chipped   Pulmonary sleep apnea and Continuous Positive Airway Pressure Ventilation , former smoker,           Cardiovascular Exercise Tolerance: Good (-) angina(-) Past MI and (-) DOE negative cardio ROS       Neuro/Psych negative neurological ROS  negative psych ROS   GI/Hepatic Neg liver ROS, GERD  Medicated and Controlled,  Endo/Other  negative endocrine ROS  Renal/GU CRFRenal disease  negative genitourinary   Musculoskeletal  (+) Arthritis ,   Abdominal   Peds  Hematology negative hematology ROS (+)   Anesthesia Other Findings Past Medical History: No date: Age related osteoporosis No date: Barrett's esophagus No date: Cataract No date: CKD (chronic kidney disease) No date: DDD (degenerative disc disease), lumbar No date: Femur fracture (HCC)     Comment:  Right No date: GERD (gastroesophageal reflux disease) No date: Hematuria No date: History of colon polyps No date: Osteopenia of the elderly No date: Sleep apnea  Past Surgical History: No date: BAND HEMORRHOIDECTOMY No date: COLONOSCOPY 05/06/2018: COLONOSCOPY WITH PROPOFOL; N/A     Comment:  Procedure: COLONOSCOPY WITH PROPOFOL;  Surgeon:               Skulskie, Martin U, MD;  Location: [facility] ENDOSCOPY;                Service: Endoscopy;  Laterality: N/A; 10/03/2016: ESOPHAGOGASTRODUODENOSCOPY (EGD) WITH PROPOFOL; N/A     Comment:  Procedure: ESOPHAGOGASTRODUODENOSCOPY (EGD) WITH               PROPOFOL;  Surgeon: Martin U Skulskie, MD;  Location:               [facility] ENDOSCOPY;  Service:  Endoscopy;  Laterality: N/A; No date: EYE SURGERY 2016: FRACTURE SURGERY; Right     Comment:  femur No date: ORIF DISTAL FEMUR FRACTURE No date: TONSILLECTOMY  BMI    Body Mass Index:  23.96 kg/m      Reproductive/Obstetrics negative OB ROS                             Anesthesia Physical Anesthesia Plan  ASA: III  Anesthesia Plan: General   Post-op Pain Management:    Induction: Intravenous  PONV Risk Score and Plan: Propofol infusion and TIVA  Airway Management Planned: Natural Airway and Nasal Cannula  Additional Equipment:   Intra-op Plan:   Post-operative Plan:   Informed Consent: I have reviewed the patients History and Physical, chart, labs and discussed the procedure including the risks, benefits and alternatives for the proposed anesthesia with the patient or authorized representative who has indicated his/her understanding and acceptance.   Dental Advisory Given  Plan Discussed with: Anesthesiologist, CRNA and Surgeon  Anesthesia Plan Comments: (Patient consented for risks of anesthesia including but not limited to:  - adverse reactions to medications - risk of intubation if required - damage to teeth, lips or other oral mucosa - sore throat or hoarseness - Damage to heart, brain, lungs or loss of life  Patient voiced understanding.)          Anesthesia Quick Evaluation  

## 2021-03-31 ENCOUNTER — Encounter: Payer: Self-pay | Admitting: Internal Medicine

## 2021-03-31 LAB — SURGICAL PATHOLOGY

## 2021-05-24 ENCOUNTER — Emergency Department (HOSPITAL_COMMUNITY): Payer: Medicare Other

## 2021-05-24 ENCOUNTER — Encounter (HOSPITAL_COMMUNITY): Payer: Self-pay | Admitting: Pharmacy Technician

## 2021-05-24 ENCOUNTER — Emergency Department (HOSPITAL_COMMUNITY)
Admission: EM | Admit: 2021-05-24 | Discharge: 2021-05-24 | Disposition: A | Discharge status: Home / Self Care | Payer: Medicare Other | Attending: Emergency Medicine | Admitting: Emergency Medicine

## 2021-05-24 DIAGNOSIS — Z79899 Other long term (current) drug therapy: Secondary | ICD-10-CM | POA: Diagnosis not present

## 2021-05-24 DIAGNOSIS — Z20822 Contact with and (suspected) exposure to covid-19: Secondary | ICD-10-CM | POA: Insufficient documentation

## 2021-05-24 DIAGNOSIS — S4991XA Unspecified injury of right shoulder and upper arm, initial encounter: Secondary | ICD-10-CM | POA: Diagnosis present

## 2021-05-24 DIAGNOSIS — M79651 Pain in right thigh: Secondary | ICD-10-CM | POA: Diagnosis not present

## 2021-05-24 DIAGNOSIS — T1490XA Injury, unspecified, initial encounter: Secondary | ICD-10-CM

## 2021-05-24 DIAGNOSIS — S42001A Fracture of unspecified part of right clavicle, initial encounter for closed fracture: Secondary | ICD-10-CM

## 2021-05-24 DIAGNOSIS — S42034A Nondisplaced fracture of lateral end of right clavicle, initial encounter for closed fracture: Secondary | ICD-10-CM | POA: Insufficient documentation

## 2021-05-24 DIAGNOSIS — Y92410 Unspecified street and highway as the place of occurrence of the external cause: Secondary | ICD-10-CM | POA: Insufficient documentation

## 2021-05-24 DIAGNOSIS — M25551 Pain in right hip: Secondary | ICD-10-CM | POA: Insufficient documentation

## 2021-05-24 DIAGNOSIS — S0990XA Unspecified injury of head, initial encounter: Secondary | ICD-10-CM | POA: Diagnosis not present

## 2021-05-24 LAB — I-STAT CHEM 8, ED
BUN: 31 mg/dL — ABNORMAL HIGH (ref 8–23)
Calcium, Ion: 1.15 mmol/L (ref 1.15–1.40)
Chloride: 107 mmol/L (ref 98–111)
Creatinine, Ser: 1.9 mg/dL — ABNORMAL HIGH (ref 0.61–1.24)
Glucose, Bld: 121 mg/dL — ABNORMAL HIGH (ref 70–99)
HCT: 35 % — ABNORMAL LOW (ref 39.0–52.0)
Hemoglobin: 11.9 g/dL — ABNORMAL LOW (ref 13.0–17.0)
Potassium: 4.1 mmol/L (ref 3.5–5.1)
Sodium: 139 mmol/L (ref 135–145)
TCO2: 20 mmol/L — ABNORMAL LOW (ref 22–32)

## 2021-05-24 LAB — SAMPLE TO BLOOD BANK

## 2021-05-24 LAB — COMPREHENSIVE METABOLIC PANEL
ALT: 19 U/L (ref 0–44)
AST: 26 U/L (ref 15–41)
Albumin: 3.3 g/dL — ABNORMAL LOW (ref 3.5–5.0)
Alkaline Phosphatase: 64 U/L (ref 38–126)
Anion gap: 10 (ref 5–15)
BUN: 33 mg/dL — ABNORMAL HIGH (ref 8–23)
CO2: 21 mmol/L — ABNORMAL LOW (ref 22–32)
Calcium: 8.9 mg/dL (ref 8.9–10.3)
Chloride: 106 mmol/L (ref 98–111)
Creatinine, Ser: 1.93 mg/dL — ABNORMAL HIGH (ref 0.61–1.24)
GFR, Estimated: 34 mL/min — ABNORMAL LOW (ref >60)
Glucose, Bld: 128 mg/dL — ABNORMAL HIGH (ref 70–99)
Potassium: 4 mmol/L (ref 3.5–5.1)
Sodium: 137 mmol/L (ref 135–145)
Total Bilirubin: 0.6 mg/dL (ref 0.3–1.2)
Total Protein: 7.2 g/dL (ref 6.5–8.1)

## 2021-05-24 LAB — CBC
HCT: 36.8 % — ABNORMAL LOW (ref 39.0–52.0)
Hemoglobin: 12 g/dL — ABNORMAL LOW (ref 13.0–17.0)
MCH: 30 pg (ref 26.0–34.0)
MCHC: 32.6 g/dL (ref 30.0–36.0)
MCV: 92 fL (ref 80.0–100.0)
Platelets: 175 10*3/uL (ref 150–400)
RBC: 4 MIL/uL — ABNORMAL LOW (ref 4.22–5.81)
RDW: 13.2 % (ref 11.5–15.5)
WBC: 4 10*3/uL (ref 4.0–10.5)
nRBC: 0 % (ref 0.0–0.2)

## 2021-05-24 LAB — PROTIME-INR
INR: 1.1 (ref 0.8–1.2)
Prothrombin Time: 13.9 seconds (ref 11.4–15.2)

## 2021-05-24 LAB — RESP PANEL BY RT-PCR (FLU A&B, COVID) ARPGX2
Influenza A by PCR: NEGATIVE
Influenza B by PCR: NEGATIVE
SARS Coronavirus 2 by RT PCR: NEGATIVE

## 2021-05-24 LAB — ETHANOL: Alcohol, Ethyl (B): <10 mg/dL (ref <10)

## 2021-05-24 MED ORDER — HYDROCODONE-ACETAMINOPHEN 5-325 MG PO TABS
1.0000 | ORAL_TABLET | Freq: Once | ORAL | Status: AC
  Administered 2021-05-24: 1 via ORAL
  Filled 2021-05-24: qty 1

## 2021-05-24 MED ORDER — MORPHINE SULFATE (PF) 2 MG/ML IV SOLN: 2.0000 mg | INTRAVENOUS | Status: DC | PRN

## 2021-05-24 MED ORDER — HYDROCODONE-ACETAMINOPHEN 5-325 MG PO TABS: 1.0000 | ORAL_TABLET | Freq: Four times a day (QID) | ORAL | 0 refills | Status: DC | PRN

## 2021-05-24 MED ORDER — ONDANSETRON HCL 4 MG/2ML IJ SOLN
4.0000 mg | Freq: Once | INTRAMUSCULAR | Status: DC
  Filled 2021-05-24: qty 2

## 2021-05-24 MED ORDER — MORPHINE SULFATE (PF) 2 MG/ML IV SOLN
INTRAVENOUS | Status: AC
  Administered 2021-05-24: 2 mg via INTRAVENOUS
  Filled 2021-05-24: qty 1

## 2021-05-24 NOTE — Progress Notes (Signed)
Orthopedic Tech Progress Note Patient Details:  [name] [name] 26-Dec-1940 [id number]  Level 2 trauma   Patient ID: Cathleen Corti [place], male   DOB: 1941-10-22, 80 y.o.   MRN: [id number]  Janit Pagan 05/24/2021, 1:10 PM

## 2021-05-24 NOTE — ED Notes (Signed)
Patient Alert and oriented to baseline. Stable and ambulatory to baseline. Patient verbalized understanding of the discharge instructions.  Patient belongings were taken by the patient.   

## 2021-05-24 NOTE — Discharge Instructions (Addendum)
Return for any problem.  ?
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
## 2021-05-25 ENCOUNTER — Encounter: Payer: Self-pay | Admitting: Internal Medicine

## 2021-05-30 ENCOUNTER — Other Ambulatory Visit: Payer: Self-pay | Admitting: Physician Assistant

## 2021-05-30 ENCOUNTER — Other Ambulatory Visit (HOSPITAL_COMMUNITY): Payer: Self-pay | Admitting: Physician Assistant

## 2021-05-30 DIAGNOSIS — M79651 Pain in right thigh: Secondary | ICD-10-CM

## 2021-06-02 ENCOUNTER — Other Ambulatory Visit: Payer: Self-pay

## 2021-06-02 ENCOUNTER — Ambulatory Visit (HOSPITAL_COMMUNITY)
Admission: RE | Admit: 2021-06-02 | Discharge: 2021-06-02 | Disposition: A | Discharge status: Home / Self Care | Payer: Medicare Other | Source: Ambulatory Visit | Attending: Physician Assistant | Admitting: Physician Assistant

## 2021-06-02 DIAGNOSIS — M79651 Pain in right thigh: Secondary | ICD-10-CM | POA: Diagnosis not present

## 2021-09-15 ENCOUNTER — Inpatient Hospital Stay: Payer: Medicare Other | Attending: Radiation Oncology

## 2021-09-15 ENCOUNTER — Other Ambulatory Visit: Payer: Self-pay

## 2021-09-15 DIAGNOSIS — R972 Elevated prostate specific antigen [PSA]: Secondary | ICD-10-CM

## 2021-09-15 DIAGNOSIS — C61 Malignant neoplasm of prostate: Secondary | ICD-10-CM | POA: Insufficient documentation

## 2021-09-15 LAB — PSA: Prostatic Specific Antigen: 0.08 ng/mL (ref 0.00–4.00)

## 2021-09-21 ENCOUNTER — Ambulatory Visit: Payer: Medicare Other | Admitting: Radiation Oncology

## 2021-09-22 ENCOUNTER — Encounter: Payer: Self-pay | Admitting: Radiation Oncology

## 2021-09-22 ENCOUNTER — Other Ambulatory Visit: Payer: Self-pay | Admitting: *Deleted

## 2021-09-22 ENCOUNTER — Ambulatory Visit
Admission: RE | Admit: 2021-09-22 | Discharge: 2021-09-22 | Disposition: A | Discharge status: Home / Self Care | Payer: Medicare Other | Source: Ambulatory Visit | Attending: Radiation Oncology | Admitting: Radiation Oncology

## 2021-09-22 ENCOUNTER — Other Ambulatory Visit: Payer: Self-pay

## 2021-09-22 VITALS — BP 141/51 | HR 47 | Temp 98.5°F | Resp 16 | Wt 154.0 lb

## 2021-09-22 DIAGNOSIS — C61 Malignant neoplasm of prostate: Secondary | ICD-10-CM

## 2021-09-22 DIAGNOSIS — M84451P Pathological fracture, right femur, subsequent encounter for fracture with malunion: Secondary | ICD-10-CM | POA: Diagnosis not present

## 2021-09-22 DIAGNOSIS — Z923 Personal history of irradiation: Secondary | ICD-10-CM | POA: Insufficient documentation

## 2021-09-22 DIAGNOSIS — R9721 Rising PSA following treatment for malignant neoplasm of prostate: Secondary | ICD-10-CM | POA: Insufficient documentation

## 2021-09-22 DIAGNOSIS — M5030 Other cervical disc degeneration, unspecified cervical region: Secondary | ICD-10-CM | POA: Diagnosis not present

## 2021-09-22 NOTE — Progress Notes (Signed)
Radiation Oncology Follow up Note  Name: [name] [name]   Date:   09/22/2021 MRN:  [id number] DOB: 12-Jan-1941    This 80 y.o. male presents to the clinic today for 2-year follow-up status post IMRT radiation therapy for Gleason 7 (3+4) adenocarcinoma the prostate presenting with a PSA of 30.  REFERRING PROVIDER: Baxter Hire, MD  HPI: Patient is a an 80 year old male now out 2 years having completed IMRT radiation therapy to his prostate for Gleason 7 adenocarcinoma the prostate presenting with a PSA of 30.  Seen today in routine follow-up he is doing well specifically denies any increased lower urinary tract symptoms diarrhea or fatigue.  His most recent PSA is 0.08.Marland Kitchen  He is now 6 months out from his last and final Eligard treatment.  Patient did back in June have a CT scan of his right femur showing healing of partially ununited right intertrochanteric fracture.  He also has CT scan of his cervical spine showing mild degenerative disc disease.  Also had a CT scan of his head from trauma showing no evidence of acute intracranial abnormality.  COMPLICATIONS OF TREATMENT: none  FOLLOW UP COMPLIANCE: keeps appointments   PHYSICAL EXAM:  BP (!) 141/51   Pulse (!) 47   Temp 98.5 F (36.9 C) (Tympanic)   Resp 16   Wt 154 lb (69.9 kg)   BMI 24.12 kg/m  Well-developed well-nourished patient in NAD. HEENT reveals PERLA, EOMI, discs not visualized.  Oral cavity is clear. No oral mucosal lesions are identified. Neck is clear without evidence of cervical or supraclavicular adenopathy. Lungs are clear to A&P. Cardiac examination is essentially unremarkable with regular rate and rhythm without murmur rub or thrill. Abdomen is benign with no organomegaly or masses noted. Motor sensory and DTR levels are equal and symmetric in the upper and lower extremities. Cranial nerves II through XII are grossly intact. Proprioception is intact. No peripheral adenopathy or edema is identified. No motor or sensory  levels are noted. Crude visual fields are within normal range.  RADIOLOGY RESULTS: CT scans of head femur as well as cervical spinal reviewed compatible with above-stated findings  PLAN: Present time patient continues to do well PSA has picked up very slightly and we will continue to observe that.  I will see him back in 1 year for follow-up.  He already has follow-up appointments with urology.  Patient knows to call with any concerns.  I would like to take this opportunity to thank you for allowing me to participate in the care of your patient.Noreene Filbert, MD

## 2022-01-26 ENCOUNTER — Other Ambulatory Visit: Payer: Self-pay | Admitting: Nephrology

## 2022-01-26 DIAGNOSIS — I129 Hypertensive chronic kidney disease with stage 1 through stage 4 chronic kidney disease, or unspecified chronic kidney disease: Secondary | ICD-10-CM

## 2022-01-26 DIAGNOSIS — N1832 Chronic kidney disease, stage 3b: Secondary | ICD-10-CM

## 2022-01-26 DIAGNOSIS — N179 Acute kidney failure, unspecified: Secondary | ICD-10-CM | POA: Insufficient documentation

## 2022-01-26 DIAGNOSIS — D631 Anemia in chronic kidney disease: Secondary | ICD-10-CM

## 2022-01-26 DIAGNOSIS — R809 Proteinuria, unspecified: Secondary | ICD-10-CM

## 2022-02-02 ENCOUNTER — Other Ambulatory Visit: Payer: Self-pay

## 2022-02-02 ENCOUNTER — Ambulatory Visit
Admission: RE | Admit: 2022-02-02 | Discharge: 2022-02-02 | Disposition: A | Discharge status: Home / Self Care | Payer: Medicare Other | Source: Ambulatory Visit | Attending: Nephrology | Admitting: Nephrology

## 2022-02-02 DIAGNOSIS — N1832 Chronic kidney disease, stage 3b: Secondary | ICD-10-CM | POA: Insufficient documentation

## 2022-02-02 DIAGNOSIS — R809 Proteinuria, unspecified: Secondary | ICD-10-CM | POA: Insufficient documentation

## 2022-02-02 DIAGNOSIS — D631 Anemia in chronic kidney disease: Secondary | ICD-10-CM | POA: Insufficient documentation

## 2022-02-02 DIAGNOSIS — I129 Hypertensive chronic kidney disease with stage 1 through stage 4 chronic kidney disease, or unspecified chronic kidney disease: Secondary | ICD-10-CM | POA: Diagnosis present

## 2022-02-21 ENCOUNTER — Other Ambulatory Visit: Payer: Self-pay | Admitting: Nephrology

## 2022-02-21 DIAGNOSIS — R809 Proteinuria, unspecified: Secondary | ICD-10-CM

## 2022-02-22 ENCOUNTER — Encounter: Payer: Self-pay | Admitting: Oncology

## 2022-02-22 NOTE — Progress Notes (Signed)
Patient contacted for new patient visit. Chart reviewed and updated.  ?

## 2022-02-23 ENCOUNTER — Inpatient Hospital Stay: Payer: Medicare Other | Attending: Oncology | Admitting: Oncology

## 2022-02-23 ENCOUNTER — Ambulatory Visit
Admission: RE | Admit: 2022-02-23 | Discharge: 2022-02-23 | Disposition: A | Discharge status: Home / Self Care | Payer: Medicare Other | Source: Ambulatory Visit | Attending: Oncology | Admitting: Oncology

## 2022-02-23 ENCOUNTER — Ambulatory Visit
Admission: RE | Admit: 2022-02-23 | Discharge: 2022-02-23 | Disposition: A | Discharge status: Home / Self Care | Payer: Medicare Other | Attending: Oncology | Admitting: Oncology

## 2022-02-23 ENCOUNTER — Other Ambulatory Visit: Payer: Self-pay

## 2022-02-23 ENCOUNTER — Inpatient Hospital Stay: Payer: Medicare Other

## 2022-02-23 VITALS — BP 141/57 | HR 49 | Temp 96.3°F | Resp 18 | Ht 67.0 in | Wt 148.4 lb

## 2022-02-23 DIAGNOSIS — M810 Age-related osteoporosis without current pathological fracture: Secondary | ICD-10-CM | POA: Diagnosis not present

## 2022-02-23 DIAGNOSIS — Z8719 Personal history of other diseases of the digestive system: Secondary | ICD-10-CM | POA: Diagnosis not present

## 2022-02-23 DIAGNOSIS — D649 Anemia, unspecified: Secondary | ICD-10-CM | POA: Diagnosis present

## 2022-02-23 DIAGNOSIS — R768 Other specified abnormal immunological findings in serum: Secondary | ICD-10-CM | POA: Insufficient documentation

## 2022-02-23 DIAGNOSIS — R748 Abnormal levels of other serum enzymes: Secondary | ICD-10-CM | POA: Diagnosis present

## 2022-02-23 DIAGNOSIS — R5383 Other fatigue: Secondary | ICD-10-CM | POA: Diagnosis not present

## 2022-02-23 DIAGNOSIS — Z86010 Personal history of colonic polyps: Secondary | ICD-10-CM | POA: Diagnosis not present

## 2022-02-23 DIAGNOSIS — D631 Anemia in chronic kidney disease: Secondary | ICD-10-CM | POA: Insufficient documentation

## 2022-02-23 DIAGNOSIS — R7689 Other specified abnormal immunological findings in serum: Secondary | ICD-10-CM | POA: Insufficient documentation

## 2022-02-23 DIAGNOSIS — N184 Chronic kidney disease, stage 4 (severe): Secondary | ICD-10-CM | POA: Diagnosis present

## 2022-02-23 DIAGNOSIS — M5136 Other intervertebral disc degeneration, lumbar region: Secondary | ICD-10-CM | POA: Insufficient documentation

## 2022-02-23 DIAGNOSIS — D630 Anemia in neoplastic disease: Secondary | ICD-10-CM | POA: Insufficient documentation

## 2022-02-23 DIAGNOSIS — C9000 Multiple myeloma not having achieved remission: Secondary | ICD-10-CM | POA: Insufficient documentation

## 2022-02-23 DIAGNOSIS — M5030 Other cervical disc degeneration, unspecified cervical region: Secondary | ICD-10-CM | POA: Diagnosis not present

## 2022-02-23 DIAGNOSIS — Z79899 Other long term (current) drug therapy: Secondary | ICD-10-CM | POA: Insufficient documentation

## 2022-02-23 DIAGNOSIS — Z87891 Personal history of nicotine dependence: Secondary | ICD-10-CM | POA: Diagnosis not present

## 2022-02-23 DIAGNOSIS — K219 Gastro-esophageal reflux disease without esophagitis: Secondary | ICD-10-CM | POA: Insufficient documentation

## 2022-02-23 LAB — COMPREHENSIVE METABOLIC PANEL
ALT: 15 U/L (ref 0–44)
AST: 19 U/L (ref 15–41)
Albumin: 3.3 g/dL — ABNORMAL LOW (ref 3.5–5.0)
Alkaline Phosphatase: 74 U/L (ref 38–126)
Anion gap: 8 (ref 5–15)
BUN: 47 mg/dL — ABNORMAL HIGH (ref 8–23)
CO2: 23 mmol/L (ref 22–32)
Calcium: 8.4 mg/dL — ABNORMAL LOW (ref 8.9–10.3)
Chloride: 107 mmol/L (ref 98–111)
Creatinine, Ser: 3.41 mg/dL — ABNORMAL HIGH (ref 0.61–1.24)
GFR, Estimated: 17 mL/min — ABNORMAL LOW (ref >60)
Glucose, Bld: 106 mg/dL — ABNORMAL HIGH (ref 70–99)
Potassium: 4.6 mmol/L (ref 3.5–5.1)
Sodium: 138 mmol/L (ref 135–145)
Total Bilirubin: <0.1 mg/dL — ABNORMAL LOW (ref 0.3–1.2)
Total Protein: 7.5 g/dL (ref 6.5–8.1)

## 2022-02-23 LAB — CBC WITH DIFFERENTIAL/PLATELET
Abs Immature Granulocytes: 0.02 10*3/uL (ref 0.00–0.07)
Basophils Absolute: 0 10*3/uL (ref 0.0–0.1)
Basophils Relative: 1 %
Eosinophils Absolute: 0.2 10*3/uL (ref 0.0–0.5)
Eosinophils Relative: 4 %
HCT: 33.3 % — ABNORMAL LOW (ref 39.0–52.0)
Hemoglobin: 10.6 g/dL — ABNORMAL LOW (ref 13.0–17.0)
Immature Granulocytes: 1 %
Lymphocytes Relative: 17 %
Lymphs Abs: 0.7 10*3/uL (ref 0.7–4.0)
MCH: 29.9 pg (ref 26.0–34.0)
MCHC: 31.8 g/dL (ref 30.0–36.0)
MCV: 94.1 fL (ref 80.0–100.0)
Monocytes Absolute: 0.6 10*3/uL (ref 0.1–1.0)
Monocytes Relative: 13 %
Neutro Abs: 2.9 10*3/uL (ref 1.7–7.7)
Neutrophils Relative %: 64 %
Platelets: 151 10*3/uL (ref 150–400)
RBC: 3.54 MIL/uL — ABNORMAL LOW (ref 4.22–5.81)
RDW: 14.2 % (ref 11.5–15.5)
WBC: 4.4 10*3/uL (ref 4.0–10.5)
nRBC: 0 % (ref 0.0–0.2)

## 2022-02-23 LAB — LACTATE DEHYDROGENASE: LDH: 100 U/L (ref 98–192)

## 2022-02-23 LAB — FERRITIN: Ferritin: 86 ng/mL (ref 24–336)

## 2022-02-23 LAB — IRON AND TIBC
Iron: 60 ug/dL (ref 45–182)
Saturation Ratios: 25 % (ref 17.9–39.5)
TIBC: 238 ug/dL — ABNORMAL LOW (ref 250–450)
UIBC: 178 ug/dL

## 2022-02-23 NOTE — Progress Notes (Signed)
?Hematology/Oncology Consult note ?[place] ?[phone] [phone] ? ? ?Patient Care Team: ?Baxter Hire, MD as PCP - General (Internal Medicine) ?Noreene Filbert, MD as Radiation Oncologist (Radiation Oncology) ?Baxter Hire, MD (Internal Medicine) ?Lavonia Dana, MD as Consulting Physician (Nephrology) ?[name], MD as Consulting Physician (Oncology) ? ?REFERRING PROVIDER: ?Lavonia Dana, MD ? ?CHIEF COMPLAINTS/REASON FOR VISIT:  ?Evaluation of  ? ?HISTORY OF PRESENTING ILLNESS:  ?[name] [name] is a 81 y.o. male who was seen in consultation at the request of Lavonia Dana, MD for evaluation of abnormal SPEP results.  ? ?Patient recently had work up done at nephrologist's office. Labs reviewed,  ?Patient has progressively worsening kidney function.  He is scheduled for kidney biopsy. ?01/26/2022, free kappa light chain 1058, lambda 15.3, light chain ratio 69. ?Protein electrophoresis showed restricted band-M spike migrating in the beta-1 globulin region. ?ANA negative.  ANCA negative. ?Random urine protein electrophoresis showed abnormal protein band detected in the gamma globulin And a second possible abnormal protein band that may represent monoclonal protein. ? ?Patient denies any back pain, unintentional weight loss, night sweats or fever. ?He lives at home with wife. ?Review of Systems  ?Constitutional:  Positive for fatigue. Negative for appetite change, chills, fever and unexpected weight change.  ?HENT:   Negative for hearing loss and voice change.   ?Eyes:  Negative for eye problems and icterus.  ?Respiratory:  Negative for chest tightness, cough and shortness of breath.   ?Cardiovascular:  Negative for chest pain and leg swelling.  ?Gastrointestinal:  Negative for abdominal distention and abdominal pain.  ?Endocrine: Negative for hot flashes.  ?Genitourinary:  Negative for difficulty urinating, dysuria and frequency.   ?Musculoskeletal:  Negative  for arthralgias.  ?Skin:  Negative for itching and rash.  ?Neurological:  Negative for light-headedness and numbness.  ?Hematological:  Negative for adenopathy. Does not bruise/bleed easily.  ?Psychiatric/Behavioral:  Negative for confusion.   ? ? ?MEDICAL HISTORY:  ?Past Medical History:  ?Diagnosis Date  ? Age related osteoporosis   ? Barrett's esophagus   ? Bradycardia   ? Cancer [facility])   ? PROSTATE  ? Cataract   ? CKD (chronic kidney disease)   ? Colon polyps   ? DDD (degenerative disc disease), cervical   ? DDD (degenerative disc disease), lumbar   ? Femur fracture ([place])   ? Right  ? Fundic gland polyposis of stomach   ? Fundic gland polyps of stomach, benign   ? Gastritis   ? GERD (gastroesophageal reflux disease)   ? Hematuria   ? Hemorrhage of rectum and anus   ? History of colon polyps   ? Insomnia   ? Lumbago   ? Osteopenia of the elderly   ? Skin cancer   ? Sleep apnea   ? ? ?SURGICAL HISTORY: ?Past Surgical History:  ?Procedure Laterality Date  ? BAND HEMORRHOIDECTOMY    ? COLONOSCOPY    ? COLONOSCOPY WITH PROPOFOL N/A 05/06/2018  ? Procedure: COLONOSCOPY WITH PROPOFOL;  Surgeon: Lollie Sails, MD;  Location: [facility] ENDOSCOPY;  Service: Endoscopy;  Laterality: N/A;  ? COLONOSCOPY WITH PROPOFOL N/A 09/23/2018  ? Procedure: COLONOSCOPY WITH PROPOFOL;  Surgeon: Lollie Sails, MD;  Location: [facility] ENDOSCOPY;  Service: Endoscopy;  Laterality: N/A;  ? COLONOSCOPY WITH PROPOFOL N/A 03/30/2021  ? Procedure: COLONOSCOPY WITH PROPOFOL;  Surgeon: Toledo, Benay Pike, MD;  Location: [facility] ENDOSCOPY;  Service: Gastroenterology;  Laterality: N/A;  ? ESOPHAGOGASTRODUODENOSCOPY (EGD) WITH PROPOFOL N/A 10/03/2016  ? Procedure:  ESOPHAGOGASTRODUODENOSCOPY (EGD) WITH PROPOFOL;  Surgeon: Lollie Sails, MD;  Location[facility] ENDOSCOPY;  Service: Endoscopy;  Laterality: N/A;  ? EYE SURGERY    ? CATARACTS  ? EYELID SURGERY    ? FLEXIBLE SIGMOIDOSCOPY    ? FRACTURE SURGERY Right 2016  ? femur  ? FRACTURE SURGERY    ? ORIF  DISTAL FEMUR FRACTURE    ? TONSILLECTOMY    ? ? ?SOCIAL HISTORY: ?Social History  ? ?Socioeconomic History  ? Marital status: Married  ?  Spouse name: Not on file  ? Number of children: Not on file  ? Years of education: Not on file  ? Highest education level: Not on file  ?Occupational History  ? Not on file  ?Tobacco Use  ? Smoking status: Former  ?  Packs/day: 1.00  ?  Years: 15.00  ?  Pack years: 15.00  ?  Types: Cigarettes  ?  Quit date: 10/03/1974  ?  Years since quitting: 47.4  ? Smokeless tobacco: Never  ?Vaping Use  ? Vaping Use: Never used  ?Substance and Sexual Activity  ? Alcohol use: Not Currently  ?  Alcohol/week: 1.0 standard drink  ?  Types: 1 Cans of beer per week  ?  Comment: 1 beer every 2 weeks  ? Drug use: No  ? Sexual activity: Yes  ?  Birth control/protection: None  ?Other Topics Concern  ? Not on file  ?Social History Narrative  ? ** Merged History Encounter **  ?    ? ?Social Determinants of Health  ? ?Financial Resource Strain: Not on file  ?Food Insecurity: Not on file  ?Transportation Needs: Not on file  ?Physical Activity: Not on file  ?Stress: Not on file  ?Social Connections: Not on file  ?Intimate Partner Violence: Not on file  ? ? ?FAMILY HISTORY: ?Family History  ?Problem Relation Age of Onset  ? Breast cancer Mother   ? ? ?ALLERGIES:  is allergic to demerol [meperidine hcl], demerol [meperidine], and nsaids. ? ?MEDICATIONS:  ?Current Outpatient Medications  ?Medication Sig Dispense Refill  ? Cholecalciferol (VITAMIN D3) 250 MCG (10000 UT) capsule Take 10,000 Units by mouth daily.    ? ferrous sulfate (FER-IN-SOL) 75 (15 Fe) MG/ML SOLN Take 1 tablet by mouth daily.    ? ipratropium (ATROVENT) 0.03 % nasal spray Place 1 spray into both nostrils 2 (two) times daily.    ? losartan (COZAAR) 25 MG tablet Take 25 mg by mouth daily.    ? Multiple Vitamin (MULTIVITAMIN) tablet Take 1 tablet by mouth 2 (two) times daily.    ? traZODone (DESYREL) 50 MG tablet Take 50 mg by mouth at bedtime.     ? ?No current facility-administered medications for this visit.  ? ? ? ?PHYSICAL EXAMINATION: ?ECOG PERFORMANCE STATUS: 1 - Symptomatic but completely ambulatory ?Vital signs per epic record ? ?Physical Exam ?Constitutional:   ?   General: He is not in acute distress. ?HENT:  ?   Head: Normocephalic and atraumatic.  ?Eyes:  ?   General: No scleral icterus. ?Cardiovascular:  ?   Rate and Rhythm: Normal rate and regular rhythm.  ?   Heart sounds: Normal heart sounds.  ?Pulmonary:  ?   Effort: Pulmonary effort is normal. No respiratory distress.  ?   Breath sounds: No wheezing.  ?Abdominal:  ?   General: Bowel sounds are normal. There is no distension.  ?   Palpations: Abdomen is soft.  ?Musculoskeletal:     ?   General:  No deformity. Normal range of motion.  ?   Cervical back: Normal range of motion and neck supple.  ?Skin: ?   General: Skin is warm and dry.  ?   Findings: No erythema or rash.  ?Neurological:  ?   Mental Status: He is alert and oriented to person, place, and time. Mental status is at baseline.  ?   Cranial Nerves: No cranial nerve deficit.  ?   Coordination: Coordination normal.  ?Psychiatric:     ?   Mood and Affect: Mood normal.  ? ? ? ?LABORATORY DATA:  ?I have reviewed the data as listed ?Lab Results  ?Component Value Date  ? WBC 4.4 02/23/2022  ? HGB 10.6 (L) 02/23/2022  ? HCT 33.3 (L) 02/23/2022  ? MCV 94.1 02/23/2022  ? PLT 151 02/23/2022  ? ?Recent Labs  ?  05/24/21 ?1241 05/24/21 ?1254 02/23/22 ?1523  ?NA 137 139 138  ?K 4.0 4.1 4.6  ?CL 106 107 107  ?CO2 21*  --  23  ?GLUCOSE 128* 121* 106*  ?BUN 33* 31* 47*  ?CREATININE 1.93* 1.90* 3.41*  ?CALCIUM 8.9  --  8.4*  ?GFRNONAA 34*  --  17*  ?PROT 7.2  --  7.5  ?ALBUMIN 3.3*  --  3.3*  ?AST 26  --  19  ?ALT 19  --  15  ?ALKPHOS 64  --  74  ?BILITOT 0.6  --  <0.1*  ? ?Iron/TIBC/Ferritin/ %Sat ?   ?Component Value Date/Time  ? IRON 60 02/23/2022 1523  ? TIBC 238 (L) 02/23/2022 1523  ? FERRITIN 86 02/23/2022 1523  ? IRONPCTSAT 25 02/23/2022 1523  ?   ? ? ?RADIOGRAPHIC STUDIES: ?I have personally reviewed the radiological images as listed and agreed with the findings in the report.  ?US RENAL ? ?Result Date: 02/04/2022 ?CLINICAL DATA:  Chronic renal

## 2022-02-25 LAB — BETA 2 MICROGLOBULIN, SERUM: Beta-2 Microglobulin: 6.9 mg/L — ABNORMAL HIGH (ref 0.6–2.4)

## 2022-02-27 LAB — KAPPA/LAMBDA LIGHT CHAINS
Kappa free light chain: 1268.9 mg/L — ABNORMAL HIGH (ref 3.3–19.4)
Kappa, lambda light chain ratio: 96.13 — ABNORMAL HIGH (ref 0.26–1.65)
Lambda free light chains: 13.2 mg/L (ref 5.7–26.3)

## 2022-02-27 NOTE — Progress Notes (Signed)
Spoke with patient 02/27/22 @ 11:05 am. Need to arrive at 8:00 for 9:00 procedure, NPO after midnight, need for driver post procedure, need to take medications as normal, BP medication especially all reviewed with patient. Patient verbalized understanding. Patient not on any blood thinners or diabetes medication. ?

## 2022-02-28 ENCOUNTER — Other Ambulatory Visit: Payer: Self-pay | Admitting: Radiology

## 2022-02-28 ENCOUNTER — Other Ambulatory Visit: Payer: Self-pay

## 2022-02-28 DIAGNOSIS — R748 Abnormal levels of other serum enzymes: Secondary | ICD-10-CM | POA: Diagnosis not present

## 2022-02-28 DIAGNOSIS — R768 Other specified abnormal immunological findings in serum: Secondary | ICD-10-CM

## 2022-02-28 LAB — MULTIPLE MYELOMA PANEL, SERUM
Albumin SerPl Elph-Mcnc: 3.3 g/dL (ref 2.9–4.4)
Albumin/Glob SerPl: 0.9 (ref 0.7–1.7)
Alpha 1: 0.3 g/dL (ref 0.0–0.4)
Alpha2 Glob SerPl Elph-Mcnc: 0.8 g/dL (ref 0.4–1.0)
B-Globulin SerPl Elph-Mcnc: 2.5 g/dL — ABNORMAL HIGH (ref 0.7–1.3)
Gamma Glob SerPl Elph-Mcnc: 0.4 g/dL (ref 0.4–1.8)
Globulin, Total: 4 g/dL — ABNORMAL HIGH (ref 2.2–3.9)
IgA: 2883 mg/dL — ABNORMAL HIGH (ref 61–437)
IgG (Immunoglobin G), Serum: 373 mg/dL — ABNORMAL LOW (ref 603–1613)
IgM (Immunoglobulin M), Srm: 29 mg/dL (ref 15–143)
M Protein SerPl Elph-Mcnc: 1.8 g/dL — ABNORMAL HIGH
Total Protein ELP: 7.3 g/dL (ref 6.0–8.5)

## 2022-03-01 ENCOUNTER — Other Ambulatory Visit: Payer: Self-pay

## 2022-03-01 ENCOUNTER — Ambulatory Visit
Admission: RE | Admit: 2022-03-01 | Discharge: 2022-03-01 | Disposition: A | Discharge status: Home / Self Care | Payer: Medicare Other | Source: Ambulatory Visit | Attending: Nephrology | Admitting: Nephrology

## 2022-03-01 ENCOUNTER — Telehealth: Payer: Self-pay

## 2022-03-01 ENCOUNTER — Other Ambulatory Visit: Payer: Self-pay | Admitting: Oncology

## 2022-03-01 VITALS — BP 123/56 | HR 51 | Resp 14 | Ht 67.0 in | Wt 148.0 lb

## 2022-03-01 DIAGNOSIS — N179 Acute kidney failure, unspecified: Secondary | ICD-10-CM | POA: Insufficient documentation

## 2022-03-01 DIAGNOSIS — I129 Hypertensive chronic kidney disease with stage 1 through stage 4 chronic kidney disease, or unspecified chronic kidney disease: Secondary | ICD-10-CM | POA: Insufficient documentation

## 2022-03-01 DIAGNOSIS — D631 Anemia in chronic kidney disease: Secondary | ICD-10-CM

## 2022-03-01 DIAGNOSIS — N1832 Chronic kidney disease, stage 3b: Secondary | ICD-10-CM | POA: Diagnosis not present

## 2022-03-01 DIAGNOSIS — R809 Proteinuria, unspecified: Secondary | ICD-10-CM | POA: Insufficient documentation

## 2022-03-01 DIAGNOSIS — C9000 Multiple myeloma not having achieved remission: Secondary | ICD-10-CM

## 2022-03-01 DIAGNOSIS — R768 Other specified abnormal immunological findings in serum: Secondary | ICD-10-CM

## 2022-03-01 DIAGNOSIS — N289 Disorder of kidney and ureter, unspecified: Secondary | ICD-10-CM

## 2022-03-01 LAB — CBC
HCT: 32.6 % — ABNORMAL LOW (ref 39.0–52.0)
Hemoglobin: 10.4 g/dL — ABNORMAL LOW (ref 13.0–17.0)
MCH: 29.2 pg (ref 26.0–34.0)
MCHC: 31.9 g/dL (ref 30.0–36.0)
MCV: 91.6 fL (ref 80.0–100.0)
Platelets: 156 10*3/uL (ref 150–400)
RBC: 3.56 MIL/uL — ABNORMAL LOW (ref 4.22–5.81)
RDW: 14.2 % (ref 11.5–15.5)
WBC: 4 10*3/uL (ref 4.0–10.5)
nRBC: 0 % (ref 0.0–0.2)

## 2022-03-01 LAB — IFE+PROTEIN ELECTRO, 24-HR UR
% BETA, Urine: 64.3 %
ALPHA 1 URINE: 6.5 %
Albumin, U: 10.4 %
Alpha 2, Urine: 12.6 %
GAMMA GLOBULIN URINE: 6.2 %
M-SPIKE %, Urine: 48 % — ABNORMAL HIGH
M-Spike, Mg/24 Hr: 729 mg/24 hr — ABNORMAL HIGH
Total Protein, Urine-Ur/day: 1519 mg/24 hr — ABNORMAL HIGH (ref 30–150)
Total Protein, Urine: 138.1 mg/dL
Total Volume: 1100

## 2022-03-01 LAB — PROTIME-INR
INR: 1.1 (ref 0.8–1.2)
Prothrombin Time: 14.6 seconds (ref 11.4–15.2)

## 2022-03-01 MED ORDER — MIDAZOLAM HCL 2 MG/2ML IJ SOLN
INTRAMUSCULAR | Status: AC | PRN
Start: 2022-03-01 — End: 2022-03-01
  Administered 2022-03-01: .5 mg via INTRAVENOUS

## 2022-03-01 MED ORDER — FENTANYL CITRATE (PF) 100 MCG/2ML IJ SOLN
INTRAMUSCULAR | Status: AC
  Filled 2022-03-01: qty 2

## 2022-03-01 MED ORDER — SODIUM CHLORIDE 0.9 % IV SOLN
20.0000 mg | Freq: Once | INTRAVENOUS | Status: AC
  Administered 2022-03-01: 20 mg via INTRAVENOUS
  Filled 2022-03-01: qty 2

## 2022-03-01 MED ORDER — DEXAMETHASONE SODIUM PHOSPHATE 10 MG/ML IJ SOLN: 20.0000 mg | Freq: Once | INTRAMUSCULAR | Status: DC

## 2022-03-01 MED ORDER — FENTANYL CITRATE (PF) 100 MCG/2ML IJ SOLN
INTRAMUSCULAR | Status: AC | PRN
  Administered 2022-03-01 (×2): 25 ug via INTRAVENOUS

## 2022-03-01 MED ORDER — SODIUM CHLORIDE 0.9 % IV SOLN: INTRAVENOUS | Status: DC

## 2022-03-01 MED ORDER — MIDAZOLAM HCL 2 MG/2ML IJ SOLN
INTRAMUSCULAR | Status: AC
  Filled 2022-03-01: qty 2

## 2022-03-01 MED ORDER — SODIUM CHLORIDE 0.9 % IV SOLN: Freq: Once | INTRAVENOUS | Status: DC

## 2022-03-01 NOTE — Procedures (Signed)
Interventional Radiology Procedure Note ? ?Procedure: Ultrasound guided kidney biopsy ? ?Findings: Please refer to procedural dictation for full description. Right inferior pole 16 ga core x 2.  Gelfoam slurry needle track embolization. ? ?Complications: None immediate ? ?Estimated Blood Loss: < 5 mL ? ?Recommendations: ?Strict 3 hour bedrest. ?Follow up Pathology results. ? ? ?Ruthann Cancer, MD ?Pager: [phone] ? ? ? ?

## 2022-03-01 NOTE — Telephone Encounter (Signed)
PET scan scheduled for tomorrow @ 9:30 and lab/IVF @ 1:30. Pt informed of appts.  ? ?Biopsy date TBD.  ? ?

## 2022-03-01 NOTE — H&P (Signed)
? ?Chief Complaint: ?Patient was seen in consultation today for proteinuria at the request of Kolluru,Sarath ? ?Referring Physician(s): ?Kolluru,Sarath ? ?Supervising Physician: Ruthann Cancer ? ?Patient Status: [place] ? ?History of Present Illness: ?[name] [name] is a 81 y.o. male with PMHx of prostate cancer- patient follows with urology, HTN- well controlled on medication, acute kidney injury on chronic kidney disease stage IIIB with proteinuria. The patient follows with nephrology and was just seen 2/23 and request received for image guided non target renal biopsy given proteinuria and worsening GFR. The patient denies any current abdominal or flank pain, he denies chest pain or shortness of breath. He denies any current blood thinner use, denies any known bleeding or clotting disorder. The patient does have a history of sleep apnea. He has no known complications to moderate sedation.  ? ? ?Past Medical History:  ?Diagnosis Date  ? Age related osteoporosis   ? Barrett's esophagus   ? Bradycardia   ? Cancer [facility])   ? PROSTATE  ? Cataract   ? CKD (chronic kidney disease)   ? Colon polyps   ? DDD (degenerative disc disease), cervical   ? DDD (degenerative disc disease), lumbar   ? Femur fracture ([place])   ? Right  ? Fundic gland polyposis of stomach   ? Fundic gland polyps of stomach, benign   ? Gastritis   ? GERD (gastroesophageal reflux disease)   ? Hematuria   ? Hemorrhage of rectum and anus   ? History of colon polyps   ? Insomnia   ? Lumbago   ? Osteopenia of the elderly   ? Skin cancer   ? Sleep apnea   ? ? ?Past Surgical History:  ?Procedure Laterality Date  ? BAND HEMORRHOIDECTOMY    ? COLONOSCOPY    ? COLONOSCOPY WITH PROPOFOL N/A 05/06/2018  ? Procedure: COLONOSCOPY WITH PROPOFOL;  Surgeon: Lollie Sails, MD;  Location: [facility] ENDOSCOPY;  Service: Endoscopy;  Laterality: N/A;  ? COLONOSCOPY WITH PROPOFOL N/A 09/23/2018  ? Procedure: COLONOSCOPY WITH PROPOFOL;  Surgeon: Lollie Sails, MD;   Location: [facility] ENDOSCOPY;  Service: Endoscopy;  Laterality: N/A;  ? COLONOSCOPY WITH PROPOFOL N/A 03/30/2021  ? Procedure: COLONOSCOPY WITH PROPOFOL;  Surgeon: Toledo, Benay Pike, MD;  Location: [facility] ENDOSCOPY;  Service: Gastroenterology;  Laterality: N/A;  ? ESOPHAGOGASTRODUODENOSCOPY (EGD) WITH PROPOFOL N/A 10/03/2016  ? Procedure: ESOPHAGOGASTRODUODENOSCOPY (EGD) WITH PROPOFOL;  Surgeon: Lollie Sails, MD;  Location: [facility] ENDOSCOPY;  Service: Endoscopy;  Laterality: N/A;  ? EYE SURGERY    ? CATARACTS  ? EYELID SURGERY    ? FLEXIBLE SIGMOIDOSCOPY    ? FRACTURE SURGERY Right 2016  ? femur  ? FRACTURE SURGERY    ? ORIF DISTAL FEMUR FRACTURE    ? TONSILLECTOMY    ? ? ?Allergies: ?Demerol [meperidine hcl], Demerol [meperidine], and Nsaids ? ?Medications: ?Prior to Admission medications   ?Medication Sig Start Date End Date Taking? Authorizing Provider  ?Cholecalciferol (VITAMIN D3) 250 MCG (10000 UT) capsule Take 10,000 Units by mouth daily.   Yes [provider]  ?ferrous sulfate (FER-IN-SOL) 75 (15 Fe) MG/ML SOLN Take 1 tablet by mouth daily.   Yes [provider]  ?ipratropium (ATROVENT) 0.03 % nasal spray Place 1 spray into both nostrils 2 (two) times daily. 02/13/18  Yes [provider]  ?losartan (COZAAR) 25 MG tablet Take 25 mg by mouth daily.   Yes [provider]  ?Multiple Vitamin (MULTIVITAMIN) tablet Take 1 tablet by mouth 2 (two) times  daily.   Yes [provider]  ?traZODone (DESYREL) 50 MG tablet Take 50 mg by mouth at bedtime.   Yes [provider]  ?  ? ?Family History  ?Problem Relation Age of Onset  ? Breast cancer Mother   ? ? ?Social History  ? ?Socioeconomic History  ? Marital status: Married  ?  Spouse name: Not on file  ? Number of children: Not on file  ? Years of education: Not on file  ? Highest education level: Not on file  ?Occupational History  ? Not on file  ?Tobacco Use  ? Smoking status: Former  ?  Packs/day: 1.00  ?  Years: 15.00   ?  Pack years: 15.00  ?  Types: Cigarettes  ?  Quit date: 10/03/1974  ?  Years since quitting: 47.4  ? Smokeless tobacco: Never  ?Vaping Use  ? Vaping Use: Never used  ?Substance and Sexual Activity  ? Alcohol use: Not Currently  ?  Alcohol/week: 1.0 standard drink  ?  Types: 1 Cans of beer per week  ?  Comment: 1 beer every 2 weeks  ? Drug use: No  ? Sexual activity: Yes  ?  Birth control/protection: None  ?Other Topics Concern  ? Not on file  ?Social History Narrative  ? ** Merged History Encounter **  ?    ? ?Social Determinants of Health  ? ?Financial Resource Strain: Not on file  ?Food Insecurity: Not on file  ?Transportation Needs: Not on file  ?Physical Activity: Not on file  ?Stress: Not on file  ?Social Connections: Not on file  ? ?Review of Systems: A 12 point ROS discussed and pertinent positives are indicated in the HPI above.  All other systems are negative. ? ?Review of Systems ? ?Vital Signs: ?BP 116/61   Pulse 68   Resp 18   Ht _0  (1.702 m)   Wt 148 lb (67.1 kg)   SpO2 95%   BMI 23.18 kg/m?  ? ?Physical Exam ?Constitutional:   ?   Appearance: Normal appearance.  ?HENT:  ?   Head: Normocephalic and atraumatic.  ?Cardiovascular:  ?   Rate and Rhythm: Normal rate and regular rhythm.  ?Pulmonary:  ?   Effort: Pulmonary effort is normal. No respiratory distress.  ?Abdominal:  ?   General: Abdomen is flat. There is no distension.  ?   Tenderness: There is no abdominal tenderness.  ?Skin: ?   General: Skin is warm and dry.  ?Neurological:  ?   Mental Status: He is alert and oriented to person, place, and time.  ? ? ?Imaging: ?US RENAL ? ?Result Date: 02/04/2022 ?CLINICAL DATA:  Chronic renal disease with hematuria EXAM: RENAL / URINARY TRACT ULTRASOUND COMPLETE COMPARISON:  02/07/2019 CT FINDINGS: Right Kidney: Renal measurements: 10.7 x 5.9 x 6.1 cm. = volume: 200 mL. Mild to moderate hydronephrosis is noted. Mild increased echogenicity is noted. Left Kidney: Renal measurements: 10.0 x 5.8 x 5.7  cm. = volume: 172 mL. Mild increased echogenicity is noted. 2.7 cm simple cyst is noted in the lower pole left kidney stable from prior CT. Bladder: Appears normal for degree of bladder distention. Other: None. IMPRESSION: Mild to moderate right-sided hydronephrosis. Increased echogenicity consistent with medical renal disease. Left renal cyst stable from prior CT. Electronically Signed   By: Inez Catalina M.D.   On: 02/04/2022 02:02  ? ?DG Bone Survey Met ? ?Result Date: 02/26/2022 ?CLINICAL DATA:  Elevated light chain ratio EXAM: METASTATIC BONE SURVEY  COMPARISON:  Bone scan 02/07/2019 FINDINGS: Skeletal survey consists of AP view chest, AP and lateral view of spine, lateral view of the skull, AP views of the bilateral upper and lower extremities, AP view pelvis. No acute fracture or malalignment. Old distal right clavicular fracture. Lung fields are clear. Cardiac size is normal. Mild degenerative changes of the cervical spine. Slightly heterogeneous mineralization of the forearms without discrete lytic lesion. 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 There are degenerative changes at L5-S1. Degenerative osteophytes throughout the thoracolumbar spine. Vertebral body heights are maintained. IMPRESSION: 1. No definitive lytic lesions are visualized.  No fracture is seen 2. 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 Electronically Signed   By: Donavan Foil M.D.   On: 02/26/2022 15:39   ? ?Labs: ? ?CBC: ?Recent Labs  ?  05/24/21 ?1241 05/24/21 ?1254 02/23/22 ?1523 03/01/22 ?0805  ?WBC 4.0  --  4.4 4.0  ?HGB 12.0* 11.9* 10.6* 10.4*  ?HCT 36.8* 35.0* 33.3* 32.6*  ?PLT 175  --  151 156  ? ? ?COAGS: ?Recent Labs  ?  05/24/21 ?1241  ?INR 1.1  ? ? ?BMP: ?Recent Labs  ?  05/24/21 ?1241 05/24/21 ?1254 02/23/22 ?1523  ?NA 137 139 138  ?K 4.0 4.1 4.6  ?CL 106 107 107  ?CO2 21*  --  23  ?GLUCOSE 128* 121* 106*  ?BUN 33* 31* 47*  ?CALCIUM 8.9  --  8.4*  ?CREATININE 1.93* 1.90* 3.41*  ?GFRNONAA 34*  --  17*  ? ? ?LIVER  FUNCTION TESTS: ?Recent Labs  ?  05/24/21 ?1241 02/23/22 ?1523  ?BILITOT 0.6 <0.1*  ?AST 26 19  ?ALT 19 15  ?ALKPHOS 64 74  ?PROT 7.2 7.5  ?ALBUMIN 3.3* 3.3*  ? ? ?Assessment and Plan: ?This is a 81 year old male wi

## 2022-03-01 NOTE — Progress Notes (Signed)
Patient clinically stable post Renal biopsy per Dr Serafina Royals, tolerated well. Vitals stable per and post procedure. Wife at bedside post procedure with update given. Report given to Carlynn Spry RN post procedure/specials. Received Versed 0.5 mg along with Fentanyl 50 mcg IV for procedure.  ?

## 2022-03-01 NOTE — Telephone Encounter (Signed)
Dr. Tasia Catchings has talked to Dr. Abigail Butts.   ? ?Spoke to pt's wife since pt is unavailable, infomed her about lab results and the need for IVF/ IV steroids today. Aslo informed her of the need fro BM biopsy and PET scan. Pt will be getting IVF/ iv steroid in IR and will come back tomorrow for lab/ IVF.  ? ?Biopsy request faxed.  ? ?Message sent to scheduling to schedule PET.  ?

## 2022-03-01 NOTE — Telephone Encounter (Signed)
Pt scheduled for Bmbx on 4/3 @ 9:30. The IR nurse has informed pt of biopsy appt.  ?

## 2022-03-01 NOTE — OR Nursing (Signed)
Dr Clayborne Dana requested pt get 1 liter NS today and dexamethasone  20 mg, instead of sending patient to cancer center after discharge, pt receiving this in specials recovery instead.  ?

## 2022-03-01 NOTE — Telephone Encounter (Signed)
-----  Message from [name], MD sent at 02/28/2022 11:22 PM EDT ----- ?Let him know that his blood working suggests that he may have multiple myeloma.  ?His kidney function has further decreased, please contact Dr.Kolluru's office and update him the result.  ?He will need IV NS hydration with dexamethasone 77m x 1 at our outpatient infusion center or need to go to ER. He has kidney biopsy scheduled on 3/28. Dr.Kolluru copied on this message.  ?Please schedule him to have STAT bone marrow biopsy, as well as PET whole body ASAP.  ? ?

## 2022-03-01 NOTE — Progress Notes (Signed)
Patient placed on schedule for BMB 4/4, spoke with patient and wife who are here post Renal biopsy with pre procedure instructions given. Made aware to be here @ 0900, NPO after Mn prior along with driver post procedure/recovery/discharge, stated understanding. ?

## 2022-03-02 ENCOUNTER — Encounter (HOSPITAL_COMMUNITY)
Admission: RE | Admit: 2022-03-02 | Discharge: 2022-03-02 | Disposition: A | Discharge status: Home / Self Care | Payer: Medicare Other | Source: Ambulatory Visit | Attending: Oncology | Admitting: Oncology

## 2022-03-02 ENCOUNTER — Inpatient Hospital Stay: Payer: Medicare Other

## 2022-03-02 VITALS — BP 125/52 | HR 91 | Temp 97.5°F

## 2022-03-02 DIAGNOSIS — C9000 Multiple myeloma not having achieved remission: Secondary | ICD-10-CM | POA: Insufficient documentation

## 2022-03-02 DIAGNOSIS — D631 Anemia in chronic kidney disease: Secondary | ICD-10-CM

## 2022-03-02 DIAGNOSIS — R748 Abnormal levels of other serum enzymes: Secondary | ICD-10-CM | POA: Diagnosis not present

## 2022-03-02 DIAGNOSIS — R768 Other specified abnormal immunological findings in serum: Secondary | ICD-10-CM

## 2022-03-02 DIAGNOSIS — R7689 Other specified abnormal immunological findings in serum: Secondary | ICD-10-CM

## 2022-03-02 DIAGNOSIS — N289 Disorder of kidney and ureter, unspecified: Secondary | ICD-10-CM

## 2022-03-02 LAB — BASIC METABOLIC PANEL
Anion gap: 5 (ref 5–15)
BUN: 52 mg/dL — ABNORMAL HIGH (ref 8–23)
CO2: 25 mmol/L (ref 22–32)
Calcium: 8.5 mg/dL — ABNORMAL LOW (ref 8.9–10.3)
Chloride: 112 mmol/L — ABNORMAL HIGH (ref 98–111)
Creatinine, Ser: 3.39 mg/dL — ABNORMAL HIGH (ref 0.61–1.24)
GFR, Estimated: 17 mL/min — ABNORMAL LOW (ref >60)
Glucose, Bld: 114 mg/dL — ABNORMAL HIGH (ref 70–99)
Potassium: 4.1 mmol/L (ref 3.5–5.1)
Sodium: 142 mmol/L (ref 135–145)

## 2022-03-02 LAB — GLUCOSE, CAPILLARY: Glucose-Capillary: 115 mg/dL — ABNORMAL HIGH (ref 70–99)

## 2022-03-02 MED ORDER — FLUDEOXYGLUCOSE F - 18 (FDG) INJECTION
7.2400 | Freq: Once | INTRAVENOUS | Status: AC | PRN
  Administered 2022-03-02: 7.24 via INTRAVENOUS

## 2022-03-02 MED ORDER — SODIUM CHLORIDE 0.9 % IV SOLN
Freq: Once | INTRAVENOUS | Status: AC
  Filled 2022-03-02: qty 250

## 2022-03-02 NOTE — Progress Notes (Signed)
Pt had a kidney biopsy yesterday, a PET scan this am. Here for IVF's . 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 Explained that these fluids are going to help flush the radioactive dye out of his kidneys. He goes Monday for a bone marrow biopsy. ?

## 2022-03-03 ENCOUNTER — Telehealth: Payer: Self-pay | Admitting: *Deleted

## 2022-03-03 ENCOUNTER — Other Ambulatory Visit: Payer: Self-pay | Admitting: Oncology

## 2022-03-03 ENCOUNTER — Telehealth: Payer: Self-pay | Admitting: Urology

## 2022-03-03 ENCOUNTER — Other Ambulatory Visit: Payer: Self-pay | Admitting: Internal Medicine

## 2022-03-03 MED ORDER — DEXAMETHASONE 4 MG PO TABS: 20.0000 mg | ORAL_TABLET | Freq: Once | ORAL | 0 refills | Status: AC

## 2022-03-03 NOTE — Telephone Encounter (Signed)
Pt scheduled for Bm Bx on 4/3. Please schedule MD only 7-10 days after biopsy. Pt aware of plan, please inform him of appt.  ?

## 2022-03-03 NOTE — Telephone Encounter (Signed)
Spoke with patient and wife, per verbal from Dr. Bernardo Heater, ok to schedule next week. ?Appt scheduled ?

## 2022-03-03 NOTE — Telephone Encounter (Signed)
Patient called asking to speak with Dr Tasia Catchings or Benjamine Mola to return his call to discuss results he sees in My Chart asking for an urgent referral to Urology ?

## 2022-03-03 NOTE — Telephone Encounter (Signed)
Patient states he cannot come on Monday. He is scheduled for Wednesday.  ?

## 2022-03-03 NOTE — Telephone Encounter (Addendum)
See if this patient can come in Monday morning at 945 03/06/2022 (okay to double book).  I have time on Tuesday to add him to OR schedule and will discuss this at his appointment on Monday ?

## 2022-03-03 NOTE — Telephone Encounter (Signed)
Pt's wife calling asking for urgent appt for pt, due to " blocked ureter" wife states this was discovered on the PET scan he had yesterday. Per wife she doesn't want this to " bust" ?Please advise ?

## 2022-03-03 NOTE — Telephone Encounter (Signed)
Called report ? ?IMPRESSION: ?No definitive signs of multiple myeloma by FDG PET. ?  ?Obstruction of the RIGHT ureter with soft tissue mass in the RIGHT ?pelvis showing increased metabolic activity suspicious first and ?foremost for urothelial neoplasm given location adjacent to RIGHT ?UVJ and affect upon the RIGHT ureter. ?  ?Little or no excretion of FDG on the RIGHT but still with uptake of ?FDG by renal parenchyma. Degree of hydronephrosis is not ?substantially changed but lack of FDG excretion on the RIGHT is ?compatible with secondary sign of physiologic significance of ?ureteral obstruction. ?  ?RIGHT pelvic sidewall uptake without visible lesion, could represent ?tiny lymph node not visible on noncontrast imaging. If the patient ?is no longer and acute renal failure could consider MRI with and ?without contrast for further assessment of pelvic findings. ?Ultimately cystoscopy may be helpful for further assessment. ?  ?Aortic atherosclerosis. ?  ?Pulmonary emphysema. ?  ?Aortic Atherosclerosis (ICD10-I70.0) and Emphysema (ICD10-J43.9). ?  ?These results will be called to the ordering clinician or ?representative by the Radiologist Assistant, and communication ?documented in the PACS or [employer]. ?  ?  ?Electronically Signed ?  By: Zetta Bills M.D. ?  On: 03/02/2022 20:37 ?

## 2022-03-03 NOTE — Telephone Encounter (Signed)
Dr. Tasia Catchings spoke with patient  ?

## 2022-03-03 NOTE — Telephone Encounter (Signed)
Wife called requesting an urgent referral to Urology due to ureter being blocked and him having decreased urine output ?

## 2022-03-03 NOTE — Telephone Encounter (Signed)
He is an established patient of Dr. Bernardo Heater, Dr. Tasia Catchings has communicated with Dr. Elenor Quinones and he is able to see pt next week. His office will contact pt with appt details. Dr. Tasia Catchings has given PET results and communicated urology follow up with patient.  ?

## 2022-03-03 NOTE — Telephone Encounter (Signed)
Please review pt's PET scan from cancer center.  He came in to office and is very anxious because report says there is a blockage and he feels like that needs to be addressed ASAP.   ?

## 2022-03-06 ENCOUNTER — Ambulatory Visit
Admission: RE | Admit: 2022-03-06 | Discharge: 2022-03-06 | Disposition: A | Discharge status: Home / Self Care | Payer: Medicare Other | Source: Ambulatory Visit | Attending: Oncology | Admitting: Oncology

## 2022-03-06 ENCOUNTER — Other Ambulatory Visit: Payer: Self-pay

## 2022-03-06 DIAGNOSIS — Q939 Deletion from autosomes, unspecified: Secondary | ICD-10-CM | POA: Diagnosis not present

## 2022-03-06 DIAGNOSIS — C9000 Multiple myeloma not having achieved remission: Secondary | ICD-10-CM | POA: Insufficient documentation

## 2022-03-06 DIAGNOSIS — I129 Hypertensive chronic kidney disease with stage 1 through stage 4 chronic kidney disease, or unspecified chronic kidney disease: Secondary | ICD-10-CM | POA: Diagnosis not present

## 2022-03-06 DIAGNOSIS — N189 Chronic kidney disease, unspecified: Secondary | ICD-10-CM | POA: Insufficient documentation

## 2022-03-06 DIAGNOSIS — R001 Bradycardia, unspecified: Secondary | ICD-10-CM | POA: Diagnosis not present

## 2022-03-06 DIAGNOSIS — D696 Thrombocytopenia, unspecified: Secondary | ICD-10-CM | POA: Diagnosis not present

## 2022-03-06 DIAGNOSIS — D649 Anemia, unspecified: Secondary | ICD-10-CM | POA: Insufficient documentation

## 2022-03-06 DIAGNOSIS — Q998 Other specified chromosome abnormalities: Secondary | ICD-10-CM | POA: Insufficient documentation

## 2022-03-06 DIAGNOSIS — R898 Other abnormal findings in specimens from other organs, systems and tissues: Secondary | ICD-10-CM | POA: Diagnosis not present

## 2022-03-06 DIAGNOSIS — Z8546 Personal history of malignant neoplasm of prostate: Secondary | ICD-10-CM | POA: Diagnosis not present

## 2022-03-06 LAB — CBC WITH DIFFERENTIAL/PLATELET
Abs Immature Granulocytes: 0.01 10*3/uL (ref 0.00–0.07)
Basophils Absolute: 0 10*3/uL (ref 0.0–0.1)
Basophils Relative: 1 %
Eosinophils Absolute: 0.2 10*3/uL (ref 0.0–0.5)
Eosinophils Relative: 6 %
HCT: 32.1 % — ABNORMAL LOW (ref 39.0–52.0)
Hemoglobin: 10.2 g/dL — ABNORMAL LOW (ref 13.0–17.0)
Immature Granulocytes: 0 %
Lymphocytes Relative: 14 %
Lymphs Abs: 0.6 10*3/uL — ABNORMAL LOW (ref 0.7–4.0)
MCH: 29.1 pg (ref 26.0–34.0)
MCHC: 31.8 g/dL (ref 30.0–36.0)
MCV: 91.5 fL (ref 80.0–100.0)
Monocytes Absolute: 0.5 10*3/uL (ref 0.1–1.0)
Monocytes Relative: 12 %
Neutro Abs: 2.9 10*3/uL (ref 1.7–7.7)
Neutrophils Relative %: 67 %
Platelets: 136 10*3/uL — ABNORMAL LOW (ref 150–400)
RBC: 3.51 MIL/uL — ABNORMAL LOW (ref 4.22–5.81)
RDW: 14.6 % (ref 11.5–15.5)
WBC: 4.2 10*3/uL (ref 4.0–10.5)
nRBC: 0 % (ref 0.0–0.2)

## 2022-03-06 MED ORDER — MIDAZOLAM HCL 2 MG/2ML IJ SOLN
INTRAMUSCULAR | Status: AC | PRN
  Administered 2022-03-06: .5 mg via INTRAVENOUS

## 2022-03-06 MED ORDER — FENTANYL CITRATE (PF) 100 MCG/2ML IJ SOLN
INTRAMUSCULAR | Status: AC | PRN
  Administered 2022-03-06: 50 ug via INTRAVENOUS

## 2022-03-06 MED ORDER — MIDAZOLAM HCL 2 MG/2ML IJ SOLN
INTRAMUSCULAR | Status: AC
  Filled 2022-03-06: qty 2

## 2022-03-06 MED ORDER — FENTANYL CITRATE (PF) 100 MCG/2ML IJ SOLN
INTRAMUSCULAR | Status: AC
  Filled 2022-03-06: qty 2

## 2022-03-06 MED ORDER — SODIUM CHLORIDE 0.9 % IV SOLN
INTRAVENOUS | Status: DC
Start: 2022-03-06 — End: 2022-03-07

## 2022-03-06 MED ORDER — HEPARIN SOD (PORK) LOCK FLUSH 100 UNIT/ML IV SOLN
INTRAVENOUS | Status: AC
  Filled 2022-03-06: qty 5

## 2022-03-06 MED ORDER — HEPARIN SOD (PORK) LOCK FLUSH 100 UNIT/ML IV SOLN
INTRAVENOUS | Status: AC
Start: 2022-03-06 — End: 2022-03-06
  Filled 2022-03-06: qty 5

## 2022-03-06 NOTE — H&P (Addendum)
? ?Chief Complaint: ?Patient was seen in consultation today for abnormal lab work concern for multiple myeloma at the request of Yu,Zhou ? ?Referring Physician(s): ?Yu,Zhou ? ?Supervising Physician: Wagner, Jaime ? ?Patient Status: [facility] - Out-pt ? ?History of Present Illness: ?[name] [name] is a 80 y.o. male known to our service from recent renal biopsy on 3/29- pathology still in process. The patient has significant PMHx for prostate cancer, HTN, acute on chronic kidney disease and now abnormal kappa free light chain and SPEP lab work with concern for multiple myeloma. The patient follows with Dr. Yu and was recently seen on 3/23, he is scheduled today for an image guided bone marrow biopsy given abnormal lab work, recent skeletal survey and PET performed with no lytic lesions seen. The patient does have bradycardia but denies any symptoms currently with his heart rate and denies any issues with recent sedation for renal biopsy. The patient denies any current chest pain or shortness of breath. He has no known complications to sedation and has recently tolerated sedation with his renal biopsy without complications.  ? ?The patient has had a H&P performed within the last 30 days, all history, medications, and exam have been reviewed. The patient denies any interval changes since the H&P. ? ? ?Past Medical History:  ?Diagnosis Date  ? Age related osteoporosis   ? Barrett's esophagus   ? Bradycardia   ? Cancer ([facility])   ? PROSTATE  ? Cataract   ? CKD (chronic kidney disease)   ? Colon polyps   ? DDD (degenerative disc disease), cervical   ? DDD (degenerative disc disease), lumbar   ? Femur fracture ([facility])   ? Right  ? Fundic gland polyposis of stomach   ? Fundic gland polyps of stomach, benign   ? Gastritis   ? GERD (gastroesophageal reflux disease)   ? Hematuria   ? Hemorrhage of rectum and anus   ? History of colon polyps   ? Insomnia   ? Lumbago   ? Osteopenia of the elderly   ? Skin cancer   ? Sleep apnea    ? ? ?Past Surgical History:  ?Procedure Laterality Date  ? BAND HEMORRHOIDECTOMY    ? COLONOSCOPY    ? COLONOSCOPY WITH PROPOFOL N/A 05/06/2018  ? Procedure: COLONOSCOPY WITH PROPOFOL;  Surgeon: Skulskie, Martin U, MD;  Location: [facility] ENDOSCOPY;  Service: Endoscopy;  Laterality: N/A;  ? COLONOSCOPY WITH PROPOFOL N/A 09/23/2018  ? Procedure: COLONOSCOPY WITH PROPOFOL;  Surgeon: Skulskie, Martin U, MD;  Location: [facility] ENDOSCOPY;  Service: Endoscopy;  Laterality: N/A;  ? COLONOSCOPY WITH PROPOFOL N/A 03/30/2021  ? Procedure: COLONOSCOPY WITH PROPOFOL;  Surgeon: Toledo, Teodoro K, MD;  Location: [facility] ENDOSCOPY;  Service: Gastroenterology;  Laterality: N/A;  ? ESOPHAGOGASTRODUODENOSCOPY (EGD) WITH PROPOFOL N/A 10/03/2016  ? Procedure: ESOPHAGOGASTRODUODENOSCOPY (EGD) WITH PROPOFOL;  Surgeon: Martin U Skulskie, MD;  Location: [facility] ENDOSCOPY;  Service: Endoscopy;  Laterality: N/A;  ? EYE SURGERY    ? CATARACTS  ? EYELID SURGERY    ? FLEXIBLE SIGMOIDOSCOPY    ? FRACTURE SURGERY Right 2016  ? femur  ? FRACTURE SURGERY    ? ORIF DISTAL FEMUR FRACTURE    ? TONSILLECTOMY    ? ? ?Allergies: ?Demerol [meperidine hcl], Demerol [meperidine], and Nsaids ? ?Medications: ?Prior to Admission medications   ?Medication Sig Start Date End Date Taking? Authorizing Provider  ?Cholecalciferol (VITAMIN D3) 250 MCG (10000 UT) capsule Take 10,000 Units by mouth daily.   Yes [provider]  ?ferrous   sulfate (FER-IN-SOL) 75 (15 Fe) MG/ML SOLN Take 1 tablet by mouth daily.   Yes [provider]  ?ipratropium (ATROVENT) 0.03 % nasal spray Place 1 spray into both nostrils 2 (two) times daily. 02/13/18  Yes [provider]  ?losartan (COZAAR) 25 MG tablet Take 25 mg by mouth daily.   Yes [provider]  ?Multiple Vitamin (MULTIVITAMIN) tablet Take 1 tablet by mouth 2 (two) times daily.   Yes [provider]  ?traZODone (DESYREL) 50 MG tablet Take 50 mg by mouth at bedtime.   Yes [provider]   ?  ? ?Family History  ?Problem Relation Age of Onset  ? Breast cancer Mother   ? ? ?Social History  ? ?Socioeconomic History  ? Marital status: Married  ?  Spouse name: Not on file  ? Number of children: Not on file  ? Years of education: Not on file  ? Highest education level: Not on file  ?Occupational History  ? Not on file  ?Tobacco Use  ? Smoking status: Former  ?  Packs/day: 1.00  ?  Years: 15.00  ?  Pack years: 15.00  ?  Types: Cigarettes  ?  Quit date: 10/03/1974  ?  Years since quitting: 47.4  ? Smokeless tobacco: Never  ?Vaping Use  ? Vaping Use: Never used  ?Substance and Sexual Activity  ? Alcohol use: Not Currently  ?  Alcohol/week: 1.0 standard drink  ?  Types: 1 Cans of beer per week  ?  Comment: 1 beer every 2 weeks  ? Drug use: No  ? Sexual activity: Yes  ?  Birth control/protection: None  ?Other Topics Concern  ? Not on file  ?Social History Narrative  ? ** Merged History Encounter **  ?    ? ?Social Determinants of Health  ? ?Financial Resource Strain: Not on file  ?Food Insecurity: Not on file  ?Transportation Needs: Not on file  ?Physical Activity: Not on file  ?Stress: Not on file  ?Social Connections: Not on file  ? ?Review of Systems: A 12 point ROS discussed and pertinent positives are indicated in the HPI above.  All other systems are negative. ? ?Review of Systems ? ?Vital Signs: ?BP (!) 154/55   Pulse (!) 44   Temp (!) 97.5 ?F (36.4 ?C) (Oral)   Resp 16   Ht 5' 7" (1.702 m)   Wt 148 lb (67.1 kg)   SpO2 100%   BMI 23.18 kg/m?  ? ?Physical Exam ?Constitutional:   ?   Appearance: Normal appearance.  ?HENT:  ?   Head: Normocephalic and atraumatic.  ?Cardiovascular:  ?   Rate and Rhythm: Regular rhythm. Bradycardia present.  ?Pulmonary:  ?   Effort: Pulmonary effort is normal. No respiratory distress.  ?Skin: ?   General: Skin is warm and dry.  ?Neurological:  ?   Mental Status: He is alert and oriented to person, place, and time.  ? ? ?Imaging: ?NM PET Image Initial (PI) Whole  Body ? ?Result Date: 03/02/2022 ?CLINICAL DATA:  Initial treatment strategy for myeloma in a 80-year-old male. EXAM: NUCLEAR MEDICINE PET WHOLE BODY TECHNIQUE: 7.24 mCi F-18 FDG was injected intravenously. Full-ring PET imaging was performed from the head to foot after the radiotracer. CT data was obtained and used for attenuation correction and anatomic localization. Fasting blood glucose: 115 mg/dl COMPARISON:  CT of February 07, 2019 of the abdomen and pelvis. FINDINGS: Mediastinal blood pool activity: SUV max 2.37 HEAD/NECK: No hypermetabolic activity in   the scalp. No hypermetabolic cervical lymph nodes. Incidental CT findings: none CHEST: No hypermetabolic mediastinal or hilar nodes. No suspicious pulmonary nodules on the CT scan. Incidental CT findings: Calcified atheromatous plaque in the thoracic aorta without aneurysmal dilation. Normal heart size without pericardial effusion. No adenopathy by size criteria in the chest. Tiny pulmonary nodule RIGHT upper lobe (image 98/4) 3 mm. Adjacent 3 mm pulmonary nodule on image 103/4. Are stable with nodules no change since 2014, compatible with benign pulmonary nodules. No follow-up recommended for these nodules. Pulmonary emphysema. Basilar atelectasis. ABDOMEN/PELVIS: RIGHT-sided hydronephrosis terminating in the distal ureter adjacent to the RIGHT UVJ where there is an extravesical mass that obstructs the RIGHT ureter which is indistinct and spiculated in appearance measuring 2.8 x 2.0 cm with a maximum SUV approximately 8.1, difficult to separate from the adjacent bladder and with encasement of the ureter though there is limited FDG excretion from the obstructed RIGHT kidney though there is uptake of FDG within the renal parenchyma. There is adjacent uptake passing towards the RIGHT pelvic sidewall (image 196/3 this is adjacent to vascular structures and there is no visible lymph node or similar finding in this area, this shows a maximum SUV of 5.5. Incidental CT  findings: Hepatic cysts. No acute process related to gallbladder, pancreas, spleen, adrenal glands or gastrointestinal tract. Aortic atherosclerosis. RIGHT-sided hydronephrosis which is grossly similar to the recent ultrasound

## 2022-03-06 NOTE — Procedures (Signed)
Interventional Radiology Procedure Note ? ?Procedure: CT guided aspirate and core biopsy of right iliac bone ?Complications: None ?Recommendations: ?- Bedrest supine x 1 hrs ?- OTC's PRN  Pain ?- Follow biopsy results ? ?Signed, ? ?Dulcy Fanny. Earleen Newport, DO ? ? ?

## 2022-03-06 NOTE — Progress Notes (Signed)
Patient clinically stable post BMB per Dr Earleen Newport, tolerated well. Vitals stable pre and post procedure. HR as noted with patient asymptomatic pre and post procedure. Received Versed 0.5 mg along with Fentanyl 50 mcg IV for procedure. Report given to [facility] RN post procedure/specials.  ?

## 2022-03-07 ENCOUNTER — Ambulatory Visit: Payer: TRICARE For Life (TFL)

## 2022-03-08 ENCOUNTER — Encounter: Payer: Self-pay | Admitting: Urology

## 2022-03-08 ENCOUNTER — Ambulatory Visit (INDEPENDENT_AMBULATORY_CARE_PROVIDER_SITE_OTHER): Payer: Medicare Other | Admitting: Urology

## 2022-03-08 VITALS — BP 120/80 | HR 74 | Ht 67.0 in | Wt 148.0 lb

## 2022-03-08 DIAGNOSIS — N1330 Unspecified hydronephrosis: Secondary | ICD-10-CM

## 2022-03-08 DIAGNOSIS — N3289 Other specified disorders of bladder: Secondary | ICD-10-CM

## 2022-03-08 DIAGNOSIS — N135 Crossing vessel and stricture of ureter without hydronephrosis: Secondary | ICD-10-CM

## 2022-03-08 LAB — MICROSCOPIC EXAMINATION
Epithelial Cells (non renal): NONE SEEN /hpf (ref 0–10)
RBC, Urine: NONE SEEN /hpf (ref 0–2)

## 2022-03-08 LAB — URINALYSIS, COMPLETE
Bilirubin, UA: NEGATIVE
Glucose, UA: NEGATIVE
Ketones, UA: NEGATIVE
Leukocytes,UA: NEGATIVE
Nitrite, UA: NEGATIVE
RBC, UA: NEGATIVE
Specific Gravity, UA: 1.01 (ref 1.005–1.030)
Urobilinogen, Ur: 0.2 mg/dL (ref 0.2–1.0)
pH, UA: 5.5 (ref 5.0–7.5)

## 2022-03-08 LAB — SURGICAL PATHOLOGY

## 2022-03-09 ENCOUNTER — Encounter: Payer: Self-pay | Admitting: Urology

## 2022-03-09 ENCOUNTER — Telehealth: Payer: Self-pay

## 2022-03-09 ENCOUNTER — Other Ambulatory Visit: Payer: Self-pay | Admitting: Urology

## 2022-03-09 DIAGNOSIS — N1330 Unspecified hydronephrosis: Secondary | ICD-10-CM

## 2022-03-09 DIAGNOSIS — N134 Hydroureter: Secondary | ICD-10-CM

## 2022-03-09 DIAGNOSIS — N3289 Other specified disorders of bladder: Secondary | ICD-10-CM

## 2022-03-09 NOTE — H&P (View-Only) (Signed)
? ?03/08/2022 ?7:23 AM  ? ?[name] [name] ?05/25/1941 ?[id number] ? ?Referring provider: Johnston, John D, MD ?[address] ?[place],  [place] [address] ? ?Chief Complaint  ?Patient presents with  ? Hydronephrosis  ? ? ?Urologic history: ?1.  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 ?HPI: ?80 y.o. male presents for an acute visit for evaluation and management of right hydronephrosis and a bladder mass. ? ?Followed for a history of prostate cancer and I last saw him 09/2020 ?PSA at that visit was undetectable ?He saw Dr. Chrystal 09/2021 and PSA was 0.08 ?Recently noted in nephrology to have worsening GFR and was referred to oncology for abnormal SPEP results ?RUS performed 02/02/2022 showed mild-moderate right hydronephrosis which was new compared with prior imaging ?He underwent a renal biopsy 03/01/2022 and bone marrow biopsy 03/06/2022 ?PET scan performed 03/02/2022 showed right hydronephrosis/hydroureter to the right UVJ with an extravesical mass that appeared to obstruct the ureter measuring 2.8 x 2 cm.  The hydronephrosis was similar to the RUS findings early March 2023 ?Denies gross hematuria or voiding complaints ?Last PSA October 2022 ? ? ?PMH: ?Past Medical History:  ?Diagnosis Date  ? Age related osteoporosis   ? Barrett's esophagus   ? Bradycardia   ? Cancer (HCC)   ? PROSTATE  ? Cataract   ? CKD (chronic kidney disease)   ? Colon polyps   ? DDD (degenerative disc disease), cervical   ? DDD (degenerative disc disease), lumbar   ? Femur fracture (HCC)   ? Right  ? Fundic gland polyposis of stomach   ? Fundic gland polyps of stomach, benign   ? Gastritis   ? GERD (gastroesophageal reflux disease)   ? Hematuria   ? Hemorrhage of rectum and anus   ?  History of colon polyps   ? Insomnia   ? Lumbago   ? Osteopenia of the elderly   ? Skin cancer   ? Sleep apnea   ? ? ?Surgical History: ?Past Surgical History:  ?Procedure Laterality Date  ? BAND HEMORRHOIDECTOMY    ? COLONOSCOPY    ? COLONOSCOPY WITH PROPOFOL N/A 05/06/2018  ? Procedure: COLONOSCOPY WITH PROPOFOL;  Surgeon: Skulskie, Martin U, MD;  Location: [facility] ENDOSCOPY;  Service: Endoscopy;  Laterality: N/A;  ? COLONOSCOPY WITH PROPOFOL N/A 09/23/2018  ? Procedure: COLONOSCOPY WITH PROPOFOL;  Surgeon: Skulskie, Martin U, MD;  Location: [facility] ENDOSCOPY;  Service: Endoscopy;  Laterality: N/A;  ? COLONOSCOPY WITH PROPOFOL N/A 03/30/2021  ? Procedure: COLONOSCOPY WITH PROPOFOL;  Surgeon: Toledo, Teodoro K, MD;  Location: [facility] ENDOSCOPY;  Service: Gastroenterology;  Laterality: N/A;  ? ESOPHAGOGASTRODUODENOSCOPY (EGD) WITH PROPOFOL N/A 10/03/2016  ? Procedure: ESOPHAGOGASTRODUODENOSCOPY (EGD) WITH PROPOFOL;  Surgeon: Martin U Skulskie, MD;  Location: [facility] ENDOSCOPY;  Service: Endoscopy;  Laterality: N/A;  ? EYE SURGERY    ? CATARACTS  ? EYELID SURGERY    ? FLEXIBLE SIGMOIDOSCOPY    ? FRACTURE SURGERY Right 2016  ? femur  ? FRACTURE SURGERY    ? ORIF DISTAL FEMUR FRACTURE    ? TONSILLECTOMY    ? ? ?Home Medications:  ?Allergies as of 03/08/2022   ? ?   Reactions  ? Demerol [meperidine Hcl] Other (See Comments)  ? "I pass out"  ? Demerol [meperidine] Nausea And Vomiting, Other (See Comments)  ?   Patient passed out.  ? Nsaids Other (See Comments)  ? Not supposed to take NSAIDS per Dr Skulskie due to Barrett's Esophagus history ?Per Nephrologist, not to take NSAIDS  ? ?  ? ?  ?Medication List  ?  ? ?  ? Accurate as of March 08, 2022 11:59 PM. If you have any questions, ask your nurse or doctor.  ?  ?  ? ?  ? ?ferrous sulfate 75 (15 Fe) MG/ML Soln ?Commonly known as: FER-IN-SOL ?Take 1 tablet by mouth daily. ?  ?ipratropium 0.03 % nasal spray ?Commonly known as: ATROVENT ?Place 1 spray into both nostrils 2 (two) times  daily. ?  ?losartan 25 MG tablet ?Commonly known as: COZAAR ?Take 25 mg by mouth daily. ?  ?multivitamin tablet ?Take 1 tablet by mouth 2 (two) times daily. ?  ?traZODone 50 MG tablet ?Commonly known as: DESYREL ?Take 50 mg by mouth at bedtime. ?  ?Vitamin D3 250 MCG (10000 UT) capsule ?Take 10,000 Units by mouth daily. ?  ? ?  ? ? ?Allergies:  ?Allergies  ?Allergen Reactions  ? Demerol [Meperidine Hcl] Other (See Comments)  ?  "I pass out"  ? Demerol [Meperidine] Nausea And Vomiting and Other (See Comments)  ?  Patient passed out.  ? Nsaids Other (See Comments)  ?  Not supposed to take NSAIDS per Dr Skulskie due to Barrett's Esophagus history ?Per Nephrologist, not to take NSAIDS  ? ? ?Family History: ?Family History  ?Problem Relation Age of Onset  ? Breast cancer Mother   ? ? ?Social History:  reports that he quit smoking about 47 years ago. His smoking use included cigarettes. He has a 15.00 pack-year smoking history. He has never used smokeless tobacco. He reports that he does not currently use alcohol after a past usage of about 1.0 standard drink per week. He reports that he does not use drugs. ? ? ?Physical Exam: ?BP 120/80   Pulse 74   Ht 5' 7" (1.702 m)   Wt 148 lb (67.1 kg)   BMI 23.18 kg/m?   ?Constitutional:  Alert and oriented, No acute distress. ?HEENT: [place] AT, moist mucus membranes.  Trachea midline, no masses. ?Cardiovascular: No clubbing, cyanosis, or edema. ?Respiratory: Normal respiratory effort, no increased work of breathing. ?Skin: No rashes, bruises or suspicious lesions. ?Neurologic: Grossly intact, no focal deficits, moving all 4 extremities. ?Psychiatric: Normal mood and affect. ? ?Pertinent Imaging: ?Ultrasound and CT images were personally reviewed and interpreted ? ?US RENAL ? ?Narrative ?CLINICAL DATA:  Chronic renal disease with hematuria ? ?EXAM: ?RENAL / URINARY TRACT ULTRASOUND COMPLETE ? ?COMPARISON:  02/07/2019 CT ? ?FINDINGS: ?Right Kidney: ? ?Renal measurements: 10.7 x 5.9 x  6.1 cm. = volume: 200 mL. Mild to ?moderate hydronephrosis is noted. Mild increased echogenicity is ?noted. ? ?Left Kidney: ? ?Renal measurements: 10.0 x 5.8 x 5.7 cm. = volume: 172 mL. Mild ?increased echogenicity is noted. 2.7 cm simple cyst is noted in the ?lower pole left kidney stable from prior CT. ? ?Bladder: ? ?Appears normal for degree of bladder distention. ? ?Other: ? ?None. ? ?IMPRESSION: ?Mild to moderate right-sided hydronephrosis. ? ?Increased echogenicity consistent with medical renal disease. ? ?Left renal cyst stable from prior CT. ? ? ?Electronically Signed ?By: Mark  Lukens M.D. ?On: 02/04/2022 02:02 ? ? ? ?Assessment & Plan:   ? ?1.  Right hydronephrosis ?He appears to have obstruction at the UVJ secondary to a bladder mass. ?Findings were discussed with Mr. Mcginn and his wife. ?  I recommended scheduling cystoscopy under anesthesia with possible TURBT and placement of a right ureteral stent ?The procedure was discussed in detail including potential risks of bleeding, infection and bladder injury. ?If the right UO cannot be located the possible need for percutaneous nephrostomy was discussed ?Will add on to OR schedule for/11/23 ? ? ?Aladdin Kollmann C Emmani Lesueur, MD ? ?[facility] ?[address], [address] ?[place], [place] [address] ?[phone] ? ?

## 2022-03-09 NOTE — Progress Notes (Signed)
? ?03/08/2022 ?7:23 AM  ? ?[name] [name] ?Mar 24, 1941 ?[id number] ? ?Referring provider: Baxter Hire, MD ?[facility] ?[name],  [name] [address] ? ?Chief Complaint  ?Patient presents with  ? Hydronephrosis  ? ? ?Urologic history: ?1.  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 ?HPI: ?81 y.o. male presents for an acute visit for evaluation and management of right hydronephrosis and a bladder mass. ? ?Followed for a history of prostate cancer and I last saw him 09/2020 ?PSA at that visit was undetectable ?He saw Dr. Baruch Gouty 09/2021 and PSA was 0.08 ?Recently noted in nephrology to have worsening GFR and was referred to oncology for abnormal SPEP results ?RUS performed 02/02/2022 showed mild-moderate right hydronephrosis which was new compared with prior imaging ?He underwent a renal biopsy 03/01/2022 and bone marrow biopsy 03/06/2022 ?PET scan performed 03/02/2022 showed right hydronephrosis/hydroureter to the right UVJ with an extravesical mass that appeared to obstruct the ureter measuring 2.8 x 2 cm.  The hydronephrosis was similar to the RUS findings early March 2023 ?Denies gross hematuria or voiding complaints ?Last PSA October 2022 ? ? ?PMH: ?Past Medical History:  ?Diagnosis Date  ? Age related osteoporosis   ? Barrett's esophagus   ? Bradycardia   ? Cancer [facility])   ? PROSTATE  ? Cataract   ? CKD (chronic kidney disease)   ? Colon polyps   ? DDD (degenerative disc disease), cervical   ? DDD (degenerative disc disease), lumbar   ? Femur fracture ([place])   ? Right  ? Fundic gland polyposis of stomach   ? Fundic gland polyps of stomach, benign   ? Gastritis   ? GERD (gastroesophageal reflux disease)   ? Hematuria   ? Hemorrhage of rectum and anus   ?  History of colon polyps   ? Insomnia   ? Lumbago   ? Osteopenia of the elderly   ? Skin cancer   ? Sleep apnea   ? ? ?Surgical History: ?Past Surgical History:  ?Procedure Laterality Date  ? BAND HEMORRHOIDECTOMY    ? COLONOSCOPY    ? COLONOSCOPY WITH PROPOFOL N/A 05/06/2018  ? Procedure: COLONOSCOPY WITH PROPOFOL;  Surgeon: Lollie Sails, MD;  Location[facility] ENDOSCOPY;  Service: Endoscopy;  Laterality: N/A;  ? COLONOSCOPY WITH PROPOFOL N/A 09/23/2018  ? Procedure: COLONOSCOPY WITH PROPOFOL;  Surgeon: Lollie Sails, MD;  Location: [facility] ENDOSCOPY;  Service: Endoscopy;  Laterality: N/A;  ? COLONOSCOPY WITH PROPOFOL N/A 03/30/2021  ? Procedure: COLONOSCOPY WITH PROPOFOL;  Surgeon: Toledo, Benay Pike, MD;  Location: [facility] ENDOSCOPY;  Service: Gastroenterology;  Laterality: N/A;  ? ESOPHAGOGASTRODUODENOSCOPY (EGD) WITH PROPOFOL N/A 10/03/2016  ? Procedure: ESOPHAGOGASTRODUODENOSCOPY (EGD) WITH PROPOFOL;  Surgeon: Lollie Sails, MD;  Location: [facility] ENDOSCOPY;  Service: Endoscopy;  Laterality: N/A;  ? EYE SURGERY    ? CATARACTS  ? EYELID SURGERY    ? FLEXIBLE SIGMOIDOSCOPY    ? FRACTURE SURGERY Right 2016  ? femur  ? FRACTURE SURGERY    ? ORIF DISTAL FEMUR FRACTURE    ? TONSILLECTOMY    ? ? ?Home Medications:  ?Allergies as of 03/08/2022   ? ?   Reactions  ? Demerol [meperidine Hcl] Other (See Comments)  ? "I pass out"  ? Demerol [meperidine] Nausea And Vomiting, Other (See Comments)  ?  Patient passed out.  ? Nsaids Other (See Comments)  ? Not supposed to take NSAIDS per Dr Gustavo Lah due to Barrett's Esophagus history ?Per Nephrologist, not to take NSAIDS  ? ?  ? ?  ?Medication List  ?  ? ?  ? Accurate as of March 08, 2022 11:59 PM. If you have any questions, ask your nurse or doctor.  ?  ?  ? ?  ? ?ferrous sulfate 75 (15 Fe) MG/ML Soln ?Commonly known as: FER-IN-SOL ?Take 1 tablet by mouth daily. ?  ?ipratropium 0.03 % nasal spray ?Commonly known as: ATROVENT ?Place 1 spray into both nostrils 2 (two) times  daily. ?  ?losartan 25 MG tablet ?Commonly known as: COZAAR ?Take 25 mg by mouth daily. ?  ?multivitamin tablet ?Take 1 tablet by mouth 2 (two) times daily. ?  ?traZODone 50 MG tablet ?Commonly known as: DESYREL ?Take 50 mg by mouth at bedtime. ?  ?Vitamin D3 250 MCG (10000 UT) capsule ?Take 10,000 Units by mouth daily. ?  ? ?  ? ? ?Allergies:  ?Allergies  ?Allergen Reactions  ? Demerol [Meperidine Hcl] Other (See Comments)  ?  "I pass out"  ? Demerol [Meperidine] Nausea And Vomiting and Other (See Comments)  ?  Patient passed out.  ? Nsaids Other (See Comments)  ?  Not supposed to take NSAIDS per Dr Gustavo Lah due to Barrett's Esophagus history ?Per Nephrologist, not to take NSAIDS  ? ? ?Family History: ?Family History  ?Problem Relation Age of Onset  ? Breast cancer Mother   ? ? ?Social History:  reports that he quit smoking about 47 years ago. His smoking use included cigarettes. He has a 15.00 pack-year smoking history. He has never used smokeless tobacco. He reports that he does not currently use alcohol after a past usage of about 1.0 standard drink per week. He reports that he does not use drugs. ? ? ?Physical Exam: ?BP 120/80   Pulse 74   Ht _0  (1.702 m)   Wt 148 lb (67.1 kg)   BMI 23.18 kg/m?   ?Constitutional:  Alert and oriented, No acute distress. ?HEENT: [place] AT, moist mucus membranes.  Trachea midline, no masses. ?Cardiovascular: No clubbing, cyanosis, or edema. ?Respiratory: Normal respiratory effort, no increased work of breathing. ?Skin: No rashes, bruises or suspicious lesions. ?Neurologic: Grossly intact, no focal deficits, moving all 4 extremities. ?Psychiatric: Normal mood and affect. ? ?Pertinent Imaging: ?Ultrasound and CT images were personally reviewed and interpreted ? ?US RENAL ? ?Narrative ?CLINICAL DATA:  Chronic renal disease with hematuria ? ?EXAM: ?RENAL / URINARY TRACT ULTRASOUND COMPLETE ? ?COMPARISON:  02/07/2019 CT ? ?FINDINGS: ?Right Kidney: ? ?Renal measurements: 10.7 x 5.9 x  6.1 cm. = volume: 200 mL. Mild to ?moderate hydronephrosis is noted. Mild increased echogenicity is ?noted. ? ?Left Kidney: ? ?Renal measurements: 10.0 x 5.8 x 5.7 cm. = volume: 172 mL. Mild ?increased echogenicity is noted. 2.7 cm simple cyst is noted in the ?lower pole left kidney stable from prior CT. ? ?Bladder: ? ?Appears normal for degree of bladder distention. ? ?Other: ? ?None. ? ?IMPRESSION: ?Mild to moderate right-sided hydronephrosis. ? ?Increased echogenicity consistent with medical renal disease. ? ?Left renal cyst stable from prior CT. ? ? ?Electronically Signed ?By: Inez Catalina M.D. ?On: 02/04/2022 02:02 ? ? ? ?Assessment & Plan:   ? ?1.  Right hydronephrosis ?He appears to have obstruction at the UVJ secondary to a bladder mass. ?Findings were discussed with Mr. Wiegel and his wife. ?  I recommended scheduling cystoscopy under anesthesia with possible TURBT and placement of a right ureteral stent ?The procedure was discussed in detail including potential risks of bleeding, infection and bladder injury. ?If the right UO cannot be located the possible need for percutaneous nephrostomy was discussed ?Will add on to OR schedule for/11/23 ? ? ?Abbie Sons, MD ? ?[facility] ?[address] ?[place], [place] [address] ?[phone] ? ?

## 2022-03-09 NOTE — Progress Notes (Signed)
Surgical Physician Order Form [facility] ? ?* Scheduling expectation :  03/14/22 ? ?*Length of Case: 60 minutes ? ?*Clearance needed: no ? ?*Anticoagulation Instructions: N/A ? ?*Aspirin Instructions: N/A ? ?*Post-op visit Date/Instructions:  1-2 week with pathology review ? ?*Diagnosis:  Right hydronephrosis/hydroureter with bladder mass ? ?*Procedure: Cystoscopy; possible TURBT (2-5 cm); right ureteral stent placement; possible gemcitabine instillation ? ? ?Additional orders: Gemcitabine '2000mg'$  bladder instillation ? ?-Admit type: OUTpatient ? ?-Anesthesia: Choice ? ?-VTE Prophylaxis Standing Order SCD?s    ?   ?Other:  ? ?-Standing Lab Orders Per Anesthesia   ? ?Lab other:  PSA , urine culture ordered yesterday ? ?-Standing Test orders EKG/Chest x-ray per Anesthesia      ? ?Test other:  ? ?- Medications:  Gentamicin per pharmacy ? ?-Other orders:  N/A ? ? ? ?  ? ?

## 2022-03-09 NOTE — Telephone Encounter (Signed)
I spoke with [name] [name]. We have discussed possible surgery dates and Tuesday April 11th, 2023 was agreed upon by all parties. Patient given information about surgery date, what to expect pre-operatively and post operatively.  ?We discussed that a Pre-Admission Testing office will be calling to set up the pre-op visit that will take place prior to surgery, and that these appointments are typically done over the phone with a Pre-Admissions RN. ? ? Informed patient that our office will communicate any additional care to be provided after surgery. Patients questions or concerns were discussed during our call. Advised to call our office should there be any additional information, questions or concerns that arise. Patient verbalized understanding.  ? ?

## 2022-03-09 NOTE — Progress Notes (Signed)
[facility] Posting Form  ? ?Surgery Date/Time:03/14/2022  ? ?Surgeon: Dr. John Giovanni, MD ? ?Surgery Location: Day Surgery ? ?Inpt ( No  )   Outpt (Yes)   Obs ( No  )  ? ?Diagnosis: N13.30 Right Hydronephrosis; N13.4 Right Hydroureter; N32.89 Bladder Mass ? ?-CPT: [id number], [place], [id number] ? ?Surgery: Cystoscopy with right ureteral stent placement; Possible Transurethral Resection of Bladder Tumor with possible intravesical instillation of Gemcitabine ? ?Stop Anticoagulations: No ? ?Cardiac/Medical/Pulmonary Clearance needed: no ? ?*Orders entered into EPIC  Date: 03/09/22  ? ?*Case booked in [place]  Date: 03/09/22 ? ?*Notified pt of Surgery: Date: 03/09/22 ? ?PRE-OP UA & CX: yes, obtained in clinic 03/08/2022 ? ?*Placed into Prior Authorization Work Fabio Bering Date: 03/09/2022 ? ? ?Assistant/laser/rep:No ? ? ? ? ? ? ? ? ? ? ? ? ? ? ? ?

## 2022-03-10 ENCOUNTER — Encounter
Admission: RE | Admit: 2022-03-10 | Discharge: 2022-03-10 | Disposition: A | Discharge status: Home / Self Care | Payer: Medicare Other | Source: Ambulatory Visit | Attending: Urology | Admitting: Urology

## 2022-03-10 ENCOUNTER — Other Ambulatory Visit: Payer: Self-pay

## 2022-03-10 HISTORY — DX: Chronic kidney disease, stage 3 unspecified: N18.30

## 2022-03-10 HISTORY — DX: Anemia, unspecified: D64.9

## 2022-03-10 NOTE — Patient Instructions (Addendum)
Your procedure is scheduled on: 03/14/22 Report to [place]. To find out your arrival time please call [phone] between 1PM - 3PM on 03/13/22.  Remember: Instructions that are not followed completely may result in serious medical risk, up to and including death, or upon the discretion of your surgeon and anesthesiologist your surgery may need to be rescheduled.     _X__ 1. Do not eat food or drink any liquids after midnight the night before your procedure.                 No gum chewing or hard candies.   __X__2.  On the morning of surgery brush your teeth with toothpaste and water, you                 may rinse your mouth with mouthwash if you wish.  Do not swallow any              toothpaste of mouthwash.     _X__ 3.  No Alcohol for 24 hours before or after surgery.   _X__ 4.  Do Not Smoke or use e-cigarettes For 24 Hours Prior to Your Surgery.                 Do not use any chewable tobacco products for at least 6 hours prior to                 surgery.  ____  5.  Bring all medications with you on the day of surgery if instructed.   __X__  6.  Notify your doctor if there is any change in your medical condition      (cold, fever, infections).     Do not wear jewelry, make-up, hairpins, clips or nail polish. Do not wear lotions, powders, or perfumes. You may wear deodorant Do not shave body hair 48 hours prior to surgery. Men may shave face and neck. Do not bring valuables to the hospital.    [facility] is not responsible for any belongings or valuables.  Contacts, dentures/partials or body piercings may not be worn into surgery. Bring a case for your contacts, glasses or hearing aids, a denture cup will be supplied. Leave your suitcase in the car. After surgery it may be brought to your room. For patients admitted to the hospital, discharge time is determined by your treatment team.   Patients discharged the day of  surgery will not be allowed to drive home.   Please read over the following fact sheets that you were given:     __X__ Take these medicines the morning of surgery with A SIP OF WATER:    1. none  2.   3.   4.  5.  6.  ____ Fleet Enema (as directed)   ____ Use CHG Soap/SAGE wipes as directed  ____ Use inhalers on the day of surgery  ____ Stop metformin/Janumet/Farxiga 2 days prior to surgery    ____ Take 1/2 of usual insulin dose the night before surgery. No insulin the morning          of surgery.   ____ Stop Blood Thinners Coumadin/Plavix/Xarelto/Pleta/Pradaxa/Eliquis/Effient/Aspirin  on   Or contact your Surgeon, Cardiologist or Medical Doctor regarding  ability to stop your blood thinners  __X__ Stop Anti-inflammatories 7 days before surgery such as Advil, Ibuprofen, Motrin,  BC or Goodies Powder, Naprosyn, Naproxen, Aleve, Aspirin   May take Tylenol if needed  __X__  Stop your multivitamin until after surgery    __X__ Bring C-Pap to the hospital.      Indwelling Urinary Catheter Care, Adult An indwelling urinary catheter is a thin, flexible, germ-free (sterile) tube that is placed into the bladder to help drain urine out of the body. The catheter is inserted into the part of the body that drains urine from the bladder (urethra). Urine drains from the catheter into a drainage bag outside of the body. Taking good care of your catheter will keep it working properly and help to prevent problems from developing. What are the risks? Bacteria may get into your bladder and cause a urinary tract infection. Urine flow can become blocked. This can happen if the catheter is not working correctly, or if you have sediment or a blood clot in your bladder or the catheter. Tissue near the catheter may become irritated and bleed. How to wear your catheter and your drainage bag Supplies needed Adhesive tape or a leg strap. Alcohol wipe or soap and water (if you use tape). A clean towel  (if you use tape). Overnight drainage bag. Smaller drainage bag (leg bag). Wearing your catheter and bag Use adhesive tape or a leg strap to attach your catheter to your leg. Make sure the catheter is not pulled tight. If a leg strap gets wet, replace it with a dry one. If you use adhesive tape: Use an alcohol wipe or soap and water to wash off any stickiness on your skin where you had tape before. Use a clean towel to pat-dry the area. Apply the new tape. You should have received a large overnight drainage bag and a smaller leg bag that fits underneath clothing. You may wear the overnight bag at any time, but you should not wear the leg bag at night. Always wear the leg bag below your knee. Make sure the overnight drainage bag is always lower than the level of your bladder, but do not let it touch the floor. Before you go to sleep, hang the bag inside a wastebasket that is covered by a clean plastic bag. How to care for your skin around the catheter   Supplies needed A clean washcloth. Water and mild soap. A clean towel. Caring for your skin and catheter Every day, use a clean washcloth and soapy water to clean the skin around your catheter. Wash your hands with soap and water. Wet a washcloth in warm water and mild soap. Clean the skin around your urethra. If you are male: Use one hand to gently spread the folds of skin around your vagina (labia). With the washcloth in your other hand, wipe the inner side of your labia on each side. Do this in a front-to-back direction. If you are male: Use one hand to pull back any skin that covers the end of your penis (foreskin). With the washcloth in your other hand, wipe your penis in small circles. Start wiping at the tip of your penis, then move outward from the catheter. Move the foreskin back in place, if this applies. With your free hand, hold the catheter close to where it enters your body. Keep holding the catheter during cleaning so  it does not get pulled out. Use your other hand to clean the catheter with the washcloth. Only wipe downward on the catheter, toward the bag. Do not wipe upward toward your body, because that may push bacteria into your urethra and cause infection. Use a clean towel to pat-dry the catheter and the  skin around it. Make sure to wipe off all soap. Wash your hands with soap and water. Shower every day. Do not take baths. Do not use cream, ointment, or lotion on the area where the catheter enters your body, unless your health care provider tells you to do that. Do not use powders, sprays, or lotions on your genital area. Check your skin around the catheter every day for signs of infection. Check for: Redness, swelling, or pain. Fluid or blood. Warmth. Pus or a bad smell. How to empty the drainage bag Supplies needed Rubbing alcohol. Gauze pad or cotton ball. Adhesive tape or a leg strap. Emptying the bag Empty your drainage bag (your overnight drainage bag or your leg bag) when it is ?- full, or at least 2-3 times a day. Clean the drainage bag according to the manufacturer's instructions or as told by your health care provider. Wash your hands with soap and water. Detach the drainage bag from your leg. Hold the drainage bag over the toilet or a clean container. Make sure the drainage bag is lower than your hips and bladder. This stops urine from going back into the tubing and into your bladder. Open the pour spout at the bottom of the bag. Empty the urine into the toilet or container. Do not let the pour spout touch any surface. This precaution is important to prevent bacteria from getting in the bag and causing infection. Apply rubbing alcohol to a gauze pad or cotton ball. Use the gauze pad or cotton ball to clean the pour spout. Close the pour spout. Attach the bag to your leg with adhesive tape or a leg strap. Wash your hands with soap and water. How to change the drainage  bag Supplies needed: Alcohol wipes. A clean drainage bag. Adhesive tape or a leg strap. Changing the bag Replace your drainage bag with a clean bag if it leaks, starts to smell bad, or looks dirty. Wash your hands with soap and water. Detach the dirty drainage bag from your leg. Pinch the catheter with your fingers so that urine does not spill out. Disconnect the catheter tube from the drainage tube at the connection valve. Do not let the tubes touch any surface. Clean the end of the catheter tube with an alcohol wipe. Use a different alcohol wipe to clean the end of the drainage tube. Connect the catheter tube to the drainage tube of the clean bag. Attach the clean bag to your leg with adhesive tape or a leg strap. Avoid attaching the new bag too tightly. Wash your hands with soap and water. General instructions  Never pull on your catheter or try to remove it. Pulling can damage your internal tissues. Always wash your hands before and after you handle your catheter or drainage bag. Use a mild, fragrance-free soap. If soap and water are not available, use hand sanitizer. Always make sure there are no twists or bends (kinks) in the catheter tube. Always make sure there are no leaks in the catheter or drainage bag. Drink enough fluid to keep your urine pale yellow. Do not take baths, swim, or use a hot tub. If you are male, wipe from front to back after having a bowel movement. Contact a health care provider if: Your urine is cloudy. Your urine smells unusually bad. Your catheter gets clogged. Your catheter starts to leak. Your bladder feels full. Get help right away if: You have redness, swelling, or pain where the catheter enters your body. You have  fluid, blood, pus, or a bad smell coming from the area where the catheter enters your body. The area where the catheter enters your body feels warm to the touch. You have a fever. You have pain in your abdomen, legs, lower back, or  bladder. You see blood in the catheter. Your urine is pink or red. You have nausea, vomiting, or chills. Your urine is not draining into the bag. Your catheter gets pulled out. Summary An indwelling urinary catheter is a thin, flexible, germ-free (sterile) tube that is placed into the bladder to help drain urine out of the body. The catheter is inserted into the part of the body that drains urine from the bladder (urethra). Take good care of your catheter to keep it working properly and help prevent problems from developing. Always wash your hands before and after you handle your catheter or drainage bag. Never pull on your catheter or try to remove it. This information is not intended to replace advice given to you by your health care provider. Make sure you discuss any questions you have with your health care provider. Document Revised: 02/09/2021 Document Reviewed: 11/05/2020 Elsevier Patient Education  2022 [employer].

## 2022-03-10 NOTE — Pre-Procedure Instructions (Signed)
Progress Notes ?- documented in this encounter ?[name] Dana, MD - 01/26/2022 9:00 AM EST ?Formatting of this note is different from the original. ?Images from the original note were not included. ?[facility] ?Follow Up Visit  ? ?Patient Name: [name] [name], male  ?Patient DOB: 18-Feb-1941 Date of Service: 83/23/2023  ?Patient MRN: [id number] Provider Creating Note: [name] Dana, MD  ?[id number] Primary Care Physician: [name] Hire, MD  ?[address] ?[place] [address] Additional Physicians/ Providers:  ? ?Impression/Recommendations  ? ?[name] [name] is an 81 y.o. white male with prostate cancer, Barrett's esophagitis, cataracts, chronic lower back pain, GERD, obstructive sleep apnea, who presents as a follow up patient with chronic kidney disease stage IIIB. Creatinine 3.07, GFR of 20.  ? ?1. Acute kidney injury on Chronic kidney disease stage IIIB with hematuria and proteinuria: Proteinuria greater than 1 gram. Albumin level is low at 3.2. ?However hematuria thought to be secondary to prostate cancer.  ?Dapagliflozin gave patient's yeast infections.  ?- Continue losartan '25mg'$  daily.  ?- Continue dapagliflozin '10mg'$  daily.  ?- Schedule renal ultrasound.  ?- Serologic work up: cystatin C, SPEP/UPEP, ANA, ANCA, anti-GBM, PLA2R, and serum complements.  ?- discussed possibility of patient requiring a kidney biopsy if proteinuria persists or GFR continues to trend down.  ?- Differential discussed. Vasculitis, glomerulonephritis, and obstruction were mentioned.  ? ?2. Hypertension: well controlled on clinic encounter, 138/65. Bradycardia on examination. Follows with cardiology for bradycardia. Defers pacemaker placement. Follows with cardiology, [name] [name].  ?- Continue losartan ?- Discussed low salt diet. ? ?3. Secondary Hyperparathyroidism: PTH, calcium and phosphorus at goal.  ?- Continue vitamin D3 supplement. ? ?4. Hematuria: with prostate cancer. No hematuria on most recent urinalysis.   ?- Followed by urology ? ?5. Anemia with chronic kidney disease: hemoglobin 12. normocytic.  ? 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 ?Orders Placed This Encounter  ?Ultrasound renal complete  ?Renal Function Panel  ?Urinalysis  ?Protein, Total, Random Urine w/Creatinine (Protein/Creat Ratio)  ?Protein Electrophoresis, Urine Random  ?Protein electrophoresis, serum  ?Kappa/Lambda free LT chains w/ratio, Serum  ?ANA Panel  ?ANCA Screen, reflex titer  ?Phospholipase A2 Receptor Antibodies  ?Glomerular basement membrane antibodies  ?C3 complement  ?C4 complement  ?Cystatin C w GFR  ? ? ? ?Return in about 6 weeks (around 03/09/2022). ? ?Chief Complaint  ? ?Chief Complaint  ?Patient presents with  ?Follow-up  ? ?History of Present Illness  ?[name] presents for follow up for chronic kidney disease. Patient presents with his wife who assists with history taking.  ? ?Patient had COVID-19 infection since last visit. He states he is close to back to his usual state of health. He is back to riding his bicycle daily. Denies any shortness of breath. Patient states his appetite is good and he denies any diarrhea.  ? ?Patient was started on dapagliflozin on last visit. He states this gave him yeast infections so he stopped this medication. ? ?Patient is quite concerned about his drop in kidney function. He states he is having some more hesitancy with urination.  ? ?He endorses that he used Aleve once since last visit.  ? ?Medications  ? ?Current Outpatient Medications:  ?Dapagliflozin Propanediol 10 MG tablet, Take 10 mg by mouth 1 (  one) time each day in the morning, Disp: 90 tablet, Rfl: 3 ?Ferrous Sulfate (IRON SUPPLEMENT PO), 1 tablet by mouth daily, Disp: , Rfl:   ?ipratropium (ATROVENT) 0.03 % nasal spray, ipratropium bromide 21 mcg (0.03 %) nasal spray, Disp: , Rfl:  ?losartan (Cozaar) 25 MG tablet, Take 1 tablet (25 mg total) by mouth 1 (one) time each day, Disp: 30 tablet, Rfl: 11 ?Multiple Vitamin (multivitamin) tablet, Take 1 tablet by mouth, Disp: , Rfl:  ?traZODone (DESYREL) 50 MG tablet, Take 50 mg by mouth at night if needed, Disp: , Rfl:  ?triamcinolone (NASACORT) 55 MCG/ACT nasal inhaler, Administer 1 spray into each nostril every night, Disp: , Rfl:  ? ?Allergies ?Meperidine and Nsaids ? 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 ?Past Surgical History:  ?Procedure Laterality Date  ?CATARACT SURGERY  ?BOTH EYES 2004  ?INTERTROCHANTERIC HIP FRACTURE SURGERY Right 2016  ?Dr. Mack [name]  ?RETINAL DETACHMENT SURGERY Right 1978  ?UVULECTOMY 1997  ? ?Family History  ?Problem Relation Age of Onset  ?Kidney disease Mother  ?Heart disease Brother  ? ?Social History  ? ?Tobacco Use  ?Smoking status: Former  ?Types: Cigarettes  ?Quit date: 12/04/1973  ?Years since quitting: 48.1  ?Smokeless tobacco: Never  ?Substance Use Topics  ?Alcohol use: Not Currently  ?Comment: Alcoholic Drinks/day: Occasional social drink  ? ?Physical Exam  ?Vitals ?BP 150/64 (BP Location: Right upper arm, Patient Position: Standing)  Pulse 54  Temp 96.9 ?F  Wt 144 lb 3.2 oz (65.4 kg)  SpO2 96%  BMI 22.58 kg/m?  ? ?Vitals reviewed. ?Constitutional: He is oriented to person, place, and time. He appears well-developed.  ?HEENT:  ?Head: Normocephalic and atraumatic.  ?Mouth/Throat:  Oropharynx is clear and moist.  ?Eyes: Pupils are equal, round, and reactive to light.  ?Neck: Neck supple.  ?Cardiovascular: Normal rate and regular rhythm.  ?Pulmonary/Chest: Effort normal and breath sounds normal.  ?Abdominal: Soft.  ?Neurological: He is alert and oriented to person, place, and time.  ?Skin: Skin is warm and dry.  ? ?Laboratory Studies  ?Chemistry  ?Lab Units 01/19/22 ?0903 09/28/21 ?2703 02/15/21 ?0858 01/24/21 ?0000  ?SODIUM mmol/L 142 138 142 143  ?POTASSIUM mmol/L 4.7 4.5 4.1 4.5  ?CHLORIDE mmol/L 107 104 106 106  ?CO2 mmol/L '25 26 27 29  '$ ?CALCIUM mg/dL 9.0 8.9 9.2 9.1  ?PHOSPHORUS mg/dL 3.7 2.8 3.3 3.5  ?PTH pg/mL 32 41 -- 27  ?GLUCOSE mg/dL 83 96 93 72  ?ALBUMIN, S/P g/dL 3.2* 3.4* 3.5* 3.5*  ?BUN mg/dL 49* 37* 36* 30*  ?CREATININE mg/dL 3.07* 2.04* 1.93* 1.66*  ?EGFRNAFR mL/min/1.69m -- -- 32* 38*  ?EGFRAFR mL/min/1.766m-- -- 37* 44*  ? ? ? ?No lab exists for component: IRON SATURATION, TRANSSATPER ? ?CBC  ?Lab Units 01/19/22 ?0903 09/28/21 ?0938 01/24/21 ?0000  ?WBC AUTO [place]/uL 4.8 3.2* 3.7*  ?HEMOGLOBIN g/dL 12.0* 11.2* 12.4*  ?HEMOGLOBIN URINE NEGATIVE NEGATIVE --  ?HEMATOCRIT % 37.1* 34.4* 37.2*  ?MCV fL 90.3 91.5 88.4  ?PLATELETS AUTO Th[place]/uL 171 163 165  ? ?Urine  ?Lab Units 01/19/22 ?0903 09/28/21 ?0938 01/24/21 ?0000  ?COLOR U YELLOW YELLOW --  ?KETONES U MG/DL NEGATIVE NEGATIVE --  ?  PROT/CREAT RATIO UR mg/g creat 1.453*  1,453* 1.371*  1,371* 1.000*  1,000*  ? ? ? ?No lab exists for component: CYCLOSPORITR  ? ?[name] Dana, MD ? ?[facility] [employer], [place] ?Electronically signed by [name] Dana, MD at 01/26/2022 1:02 PM EST  ?Plan of Treatment ?- documented as of this encounter ?Plan of Treatment - Upcoming Encounters ?Upcoming Encounters ?Date Type Specialty Care Team Description  ?03/29/2022 Office Visit Nephrology [name] Dana, MD   ?[address]   ?[place], [place] [address]   ?[phone] (Work)   ?[phone] (Fax)       ? ?Plan of Treatment - Scheduled Orders ?Scheduled Orders ?Name Type Priority Associated Diagnoses Order Schedule  ?Protein, Total, Random Urine w/Creatinine (Protein/Creat Ratio) Lab Routine Stage 3b chronic kidney diseas

## 2022-03-10 NOTE — Pre-Procedure Instructions (Signed)
Progress Notes ?- documented in this encounter ?Isaias Cowman, MD - 09/28/2021 3:00 PM EDT ?Formatting of this note is different from the original. ?Established Patient Visit  ? ?Chief Complaint: ?Chief Complaint  ?Patient presents with  ? Follow-up  ?1 year  ?Date of Service: 09/28/2021 ?Date of Birth: [birth year] ?PCP: Velna Ochs, MD  ?History of Present Illness: [name] is a 81 y.o.male patient who returns for follow-up for asymptomatic bradycardia. The patient was last seen 08/29/2016 for asymptomatic bradycardia. ECG 07/17/2016 revealed sinus bradycardia at a rate of 50 bpm. 24-hour Holter monitor was performed 08/15/2016 which revealed mean heart rate of 61 bpm with a range of 47-86 bpm, with occasional premature atrial contractions. The patient underwent ETT on 08/16/2016, exercised 12 minutes on a Bruce protocol achieving a maximum heart rate of 114 bpm which is 79% of maximum heart rate, without chest pain, shortness of breath, or ECG changes. ? ?Patient was in his usual state of health until 04/23/2018 when he experienced an episode of lightheadedness, was evaluated at the walk-in clinic, referred to [facility] ED because of bradycardia. ECG revealed marked sinus bradycardia rate of 40 bpm. Troponin was less than 0.03. Symptoms resolved spontaneously. 24-hour Holter monitor was performed which revealed predominant sinus bradycardia with a mean heart rate of 59 bpm. Heart rate ranges 43-134. While wearing the Holter monitor, the patient rode his bike 40 miles in 2 hours without difficulty. ? ?The patient returns today for follow-up, reports "doing great". He denies exertional chest pain or shortness of breath. He reports occasional episodes of palpitations. He denies peripheral edema. The patient remains very active for his age. He continues to ride his bike 50 miles a day 2-3 times weekly without any difficulty. ECG reveals marked sinus bradycardia with first-degree AV block at 44 bpm with incomplete  right bundle branch block which looks very similar to prior ECG from 09/30/2020. ? ?Past Medical and Surgical History  ?Past Medical History ?Past Medical History:  ?Diagnosis Date  ? Age related osteoporosis  ? Benign neoplasm of colon  ? Cancer (CMS-HCC) skin cancer  ? Cataracts, both eyes  ? Chronic kidney disease  ? Colon polyp 05/27/07  ?tubular adenoma  ? Colon polyp 05/06/08  ?tubular adenoma  ? Colon polyp 03/29/10  ?tubular adenoma  ? Colon polyp 05/26/13  ?tubular adenoma  ? DDD (degenerative disc disease), lumbar  ?Mild degenerative changes lumbar spine with slight narrowing of L5-S1.  ? Femur fracture, right (CMS-HCC)  ? Fundic gland polyps of stomach, benign 10/03/2016  ? Gastritis 10/03/2016  ? 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 Followed by Dr. Gustavo Lah  ? Hemorrhage of rectum and anus  ? Lumbago  ? Osteopenia  ?DEXA 08/04/15  ? Reflux esophagitis 06/09/14  ? Sleep apnea  ?Previous, pharyngeal surgery per Dr. Kathyrn Sheriff.  ? ?Past Surgical History ?He has a past surgical history that includes Colonoscopy; Sigmoidoscopy (07/04/2007); Sigmoidoscopy (10/28/2007); Sigmoidoscopy (09/10/2008); Upper gastrointestinal endoscopy (02/12/2007); Upper gastrointestinal endoscopy (02/10/2008); Upper gastrointestinal endoscopy (03/29/2010); Upper gastrointestinal endoscopy (05/24/2012); Lobectomy Partial Thyroid ([place]); eyelid surgery (1978); band ligation of internal hemorrhoids (2009); egd (06/09/2014); ORIF distal femur fracture (Right, 03/2015); egd (10/03/2016); Fracture surgery (03/2015); Cataract extraction; Tonsillectomy; Colonoscopy (05/06/2018); Colonoscopy (09/23/2018); and Colonoscopy (03/30/2021).  ? ?Medications and Allergies  ?Current Medications ? ?Current Outpatient Medications  ?Medication Sig Dispense Refill  ? ipratropium (ATROVENT) 21 mcg (0.03 %) nasal spray USE 1 TO 2 SPRAYS IN EACH NOSTRIL TWICE DAILY AS NEEDED FOR  NASAL DRIP  ? losartan (COZAAR) 25  MG tablet Take 25 mg by mouth once daily  ? multivitamin tablet Take 1 tablet by mouth 2 (two) times daily.  ? traZODone (DESYREL) 50 MG tablet Take 1 tablet (50 mg total) by mouth at bedtime as needed for Sleep 90 tablet 3  ? triamcinolone (NASACORT AQ) 55 mcg nasal spray Place into one nostril  ? calcium carbonate-vitamin D3 (OS-CAL 500+D) 500 mg(1,'250mg'$ ) -200 unit tablet Take 1 tablet by mouth once daily (Patient not taking: No sig reported)  ? ?No current facility-administered medications for this visit.  ? ?Allergies: Demerol [meperidine] and Nsaids (non-steroidal anti-inflammatory drug) ? ?Social and Family History  ?Social History ?reports that he quit smoking about 47 years ago. His smoking use included cigarettes. He started smoking about 62 years ago. He has a 15.00 pack-year smoking history. He has never used smokeless tobacco. He reports that he does not drink alcohol and does not use drugs. ? ?Family History ?Family History  ?Problem Relation Age of Onset  ? Breast cancer Mother  ?now deceased  ? Sleep apnea Father  ? High blood pressure (Hypertension) Father  ?now deceased  ? Alcohol abuse Brother  ?now deceased  ? Colon cancer Neg Hx  ? Liver disease Neg Hx  ? Ulcers Neg Hx  ? ?Review of Systems  ? ?Review of Systems: The patient denies chest pain, shortness of breath, orthopnea, paroxysmal nocturnal dyspnea, pedal edema, palpitations, heart racing, presyncope, syncope. Review of 12 Systems is negative except as described above. ? ?Physical Examination  ? ?Vitals:BP 132/58  Pulse 54  Ht 170.2 cm ('5\' 7"'$ )  Wt 68.9 kg (152 lb)  SpO2 96%  BMI 23.81 kg/m?  ?Ht:170.2 cm ('5\' 7"'$ ) Wt:68.9 kg (152 lb) FTD:DUKG surface area is 1.8 meters squared. ?Body mass index is 23.81 kg/m?. ? ?HEENT: Pupils equally reactive to light and accomodation  ?Neck: Supple without thyromegaly, carotid pulses 2+ ?Lungs: clear to auscultation bilaterally; no wheezes, rales, rhonchi ?Heart: Regular rate and rhythm. No gallops,  murmurs or rub ?Abdomen: soft nontender, nondistended, with normal bowel sounds ?Extremities: no cyanosis, clubbing, or edema ?Peripheral Pulses: 2+ in all extremities, 2+ femoral pulses bilaterally ? ?Assessment  ? ?81 y.o. male with  ?1. Bradycardia  ?2. Premature atrial contractions  ?3. CKD (chronic kidney disease) stage 2, GFR 60-89 ml/min  ? ?81 year old gentleman with asymptomatic bradycardia, with episode of lightheadedness, ECG revealing sinus bradycardia 40 bpm. Patient is very active, exercises regularly, rides a bike 50 miles daily without difficulty. Repeat 24-hour Holter monitor revealed mean heart rate of 59 bpm with minimal heart rate of 43 bpm, and maximum heart rate of 134 while riding his bicycle. Patient reports infrequent episodes of palpitations with sporadic elevation of his blood pressure. ? ?Plan  ? ?1. Continue current medications ?2. Defer permanent pacemaker at this time ?3. Return to clinic for follow-up in 1 year ? ?Orders Placed This Encounter  ?Procedures  ? ECG 12-lead  ? ?Return in about 1 year (around 09/28/2022). ? ?Isaias Cowman, MD PhD [facility] ? ? ?Electronically signed by Isaias Cowman, MD at 09/28/2021 3:32 PM EDT  ?Plan of Treatment ?- documented as of this encounter ?Plan of Treatment - Upcoming Encounters ?Upcoming Encounters ?Date Type Specialty Care Team Description  ?08/16/2022 Ancillary Orders Lab Velna Ochs, MD   ?[address]   ?[place]   ?[place],  [address]   ?[phone] (Work)   ?[phone] (Fax)      ?08/23/2022  Office Visit Internal Medicine Velna Ochs, MD   ?[address]   ?[place]   ?[place], [place] [address]   ?[phone] (Work)   ?[phone] (Fax)      ?09/26/2022 Office Visit Cardiology Isaias Cowman, MD   ?[facility]   ?[place], [place] [address]   ?[phone] (Work)   ?[phone] (Fax)      ? ?Goals ?- documented as of this  encounter ?Goals ?Goal Patient Goal Type Associated Problems Recent Progress Patient-Stated? Author  ?Patient Goals   General     Yes Capps, Patrick North, RN  ?Note:    ?Formatting of this note might be different from the original.

## 2022-03-11 LAB — CULTURE, URINE COMPREHENSIVE

## 2022-03-13 ENCOUNTER — Encounter: Payer: Self-pay | Admitting: Nephrology

## 2022-03-13 ENCOUNTER — Telehealth: Payer: Self-pay | Admitting: *Deleted

## 2022-03-13 LAB — SURGICAL PATHOLOGY

## 2022-03-13 MED ORDER — GEMCITABINE CHEMO FOR BLADDER INSTILLATION 2000 MG
2000.0000 mg | Freq: Once | INTRAVENOUS | Status: DC
  Filled 2022-03-13: qty 52.6

## 2022-03-13 MED ORDER — FAMOTIDINE 20 MG PO TABS: 20.0000 mg | ORAL_TABLET | Freq: Once | ORAL | Status: AC

## 2022-03-13 MED ORDER — ORAL CARE MOUTH RINSE: 15.0000 mL | Freq: Once | OROMUCOSAL | Status: AC

## 2022-03-13 MED ORDER — SODIUM CHLORIDE 0.9 % IV SOLN
INTRAVENOUS | Status: DC
Start: 2022-03-13 — End: 2022-03-14

## 2022-03-13 MED ORDER — GENTAMICIN SULFATE 40 MG/ML IJ SOLN
5.0000 mg/kg | INTRAVENOUS | Status: AC
  Administered 2022-03-14: 340 mg via INTRAVENOUS
  Filled 2022-03-13: qty 8.5

## 2022-03-13 MED ORDER — CHLORHEXIDINE GLUCONATE 0.12 % MT SOLN: 15.0000 mL | Freq: Once | OROMUCOSAL | Status: AC

## 2022-03-13 NOTE — Telephone Encounter (Signed)
Addended report ? ?ADDENDUM REPORT: 03/10/2022 17:33 ?  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 ?  ?These results will be called to the ordering clinician or ?representative by the Radiologist Assistant, and communication ?documented in the PACS or [employer]. ?  ?  ?Electronically Signed ?  By: Zetta Bills M.D. ?  On: 03/10/2022 17:33 ?   ?Addended by Felipa Emory, MD on 03/10/2022  5:35 PM  ? ?

## 2022-03-14 ENCOUNTER — Other Ambulatory Visit: Payer: Self-pay

## 2022-03-14 ENCOUNTER — Ambulatory Visit: Payer: Medicare Other | Admitting: Anesthesiology

## 2022-03-14 ENCOUNTER — Encounter
Admission: RE | Disposition: A | Discharge status: Home / Self Care | Payer: Self-pay | Source: Home / Self Care | Attending: Urology

## 2022-03-14 ENCOUNTER — Ambulatory Visit: Payer: Medicare Other

## 2022-03-14 ENCOUNTER — Ambulatory Visit
Admission: RE | Admit: 2022-03-14 | Discharge: 2022-03-14 | Disposition: A | Discharge status: Home / Self Care | Payer: Medicare Other | Attending: Urology | Admitting: Urology

## 2022-03-14 ENCOUNTER — Encounter: Payer: Self-pay | Admitting: Urology

## 2022-03-14 DIAGNOSIS — C61 Malignant neoplasm of prostate: Secondary | ICD-10-CM | POA: Insufficient documentation

## 2022-03-14 DIAGNOSIS — K219 Gastro-esophageal reflux disease without esophagitis: Secondary | ICD-10-CM | POA: Diagnosis not present

## 2022-03-14 DIAGNOSIS — Z87891 Personal history of nicotine dependence: Secondary | ICD-10-CM | POA: Diagnosis not present

## 2022-03-14 DIAGNOSIS — N289 Disorder of kidney and ureter, unspecified: Secondary | ICD-10-CM

## 2022-03-14 DIAGNOSIS — N1330 Unspecified hydronephrosis: Secondary | ICD-10-CM | POA: Insufficient documentation

## 2022-03-14 DIAGNOSIS — N134 Hydroureter: Secondary | ICD-10-CM

## 2022-03-14 DIAGNOSIS — N130 Hydronephrosis with ureteropelvic junction obstruction: Secondary | ICD-10-CM

## 2022-03-14 DIAGNOSIS — N3289 Other specified disorders of bladder: Secondary | ICD-10-CM

## 2022-03-14 DIAGNOSIS — G4730 Sleep apnea, unspecified: Secondary | ICD-10-CM | POA: Diagnosis not present

## 2022-03-14 DIAGNOSIS — N2889 Other specified disorders of kidney and ureter: Secondary | ICD-10-CM

## 2022-03-14 HISTORY — PX: TRANSURETHRAL RESECTION OF BLADDER TUMOR: SHX2575

## 2022-03-14 HISTORY — PX: CYSTOSCOPY W/ URETERAL STENT PLACEMENT: SHX1429

## 2022-03-14 SURGERY — TURBT (TRANSURETHRAL RESECTION OF BLADDER TUMOR)
Anesthesia: General | Laterality: Right

## 2022-03-14 MED ORDER — ONDANSETRON HCL 4 MG/2ML IJ SOLN
INTRAMUSCULAR | Status: AC
  Filled 2022-03-14: qty 2

## 2022-03-14 MED ORDER — CHLORHEXIDINE GLUCONATE 0.12 % MT SOLN
OROMUCOSAL | Status: AC
  Administered 2022-03-14: 15 mL via OROMUCOSAL
  Filled 2022-03-14: qty 15

## 2022-03-14 MED ORDER — ONDANSETRON HCL 4 MG/2ML IJ SOLN
INTRAMUSCULAR | Status: DC | PRN
  Administered 2022-03-14: 4 mg via INTRAVENOUS

## 2022-03-14 MED ORDER — EPHEDRINE 5 MG/ML INJ
INTRAVENOUS | Status: AC
  Filled 2022-03-14: qty 5

## 2022-03-14 MED ORDER — MIDAZOLAM HCL 2 MG/2ML IJ SOLN
INTRAMUSCULAR | Status: AC
  Filled 2022-03-14: qty 2

## 2022-03-14 MED ORDER — PROPOFOL 10 MG/ML IV BOLUS
INTRAVENOUS | Status: DC | PRN
  Administered 2022-03-14 (×2): 40 mg via INTRAVENOUS
  Administered 2022-03-14: 120 mg via INTRAVENOUS

## 2022-03-14 MED ORDER — FENTANYL CITRATE (PF) 100 MCG/2ML IJ SOLN: 25.0000 ug | INTRAMUSCULAR | Status: DC | PRN

## 2022-03-14 MED ORDER — TROSPIUM CHLORIDE 20 MG PO TABS: ORAL_TABLET | ORAL | 0 refills | Status: DC

## 2022-03-14 MED ORDER — EPHEDRINE SULFATE (PRESSORS) 50 MG/ML IJ SOLN
INTRAMUSCULAR | Status: DC | PRN
  Administered 2022-03-14: 10 mg via INTRAVENOUS

## 2022-03-14 MED ORDER — LIDOCAINE HCL (CARDIAC) PF 100 MG/5ML IV SOSY
PREFILLED_SYRINGE | INTRAVENOUS | Status: DC | PRN
Start: 2022-03-14 — End: 2022-03-14
  Administered 2022-03-14: 80 mg via INTRAVENOUS

## 2022-03-14 MED ORDER — FENTANYL CITRATE (PF) 100 MCG/2ML IJ SOLN
INTRAMUSCULAR | Status: AC
  Filled 2022-03-14: qty 2

## 2022-03-14 MED ORDER — FENTANYL CITRATE (PF) 100 MCG/2ML IJ SOLN
INTRAMUSCULAR | Status: DC | PRN
  Administered 2022-03-14: 30 ug via INTRAVENOUS
  Administered 2022-03-14 (×2): 25 ug via INTRAVENOUS

## 2022-03-14 MED ORDER — HYDROCODONE-ACETAMINOPHEN 7.5-325 MG PO TABS: ORAL_TABLET | ORAL | 0 refills | Status: DC

## 2022-03-14 MED ORDER — SODIUM CHLORIDE 0.9 % IR SOLN
Status: DC | PRN
Start: 2022-03-14 — End: 2022-03-14
  Administered 2022-03-14: 3000 mL via INTRAVESICAL

## 2022-03-14 MED ORDER — IOHEXOL 180 MG/ML  SOLN
INTRAMUSCULAR | Status: DC | PRN
  Administered 2022-03-14 (×2): 10 mL

## 2022-03-14 MED ORDER — MIDAZOLAM HCL 2 MG/2ML IJ SOLN
INTRAMUSCULAR | Status: DC | PRN
  Administered 2022-03-14: 2 mg via INTRAVENOUS

## 2022-03-14 MED ORDER — FAMOTIDINE 20 MG PO TABS
ORAL_TABLET | ORAL | Status: AC
  Administered 2022-03-14: 20 mg via ORAL
  Filled 2022-03-14: qty 1

## 2022-03-14 SURGICAL SUPPLY — 40 items
BAG DRAIN CYSTO-URO LG1000N (MISCELLANEOUS) ×3 IMPLANT
BAG DRN RND TRDRP ANRFLXCHMBR (UROLOGICAL SUPPLIES) ×2
BAG URINE DRAIN 2000ML AR STRL (UROLOGICAL SUPPLIES) ×3 IMPLANT
BASKET STONE 3X4X90X20 (MISCELLANEOUS) ×1 IMPLANT
BRUSH SCRUB EZ 1% IODOPHOR (MISCELLANEOUS) ×3 IMPLANT
BSKT STON RTRVL 90X20 3FR (MISCELLANEOUS) ×2
CATH FOL LX CONE TIP  8F (CATHETERS) ×1
CATH FOL LX CONE TIP 8F (CATHETERS) IMPLANT
CATH FOLEY 2WAY 18X30 (CATHETERS) IMPLANT
CATH FOLEY 2WAY SIL 18X30 (CATHETERS)
CATH URETL OPEN 5X70 (CATHETERS) IMPLANT
DRAPE UTILITY 15X26 TOWEL STRL (DRAPES) ×3 IMPLANT
DRSG TELFA 4X3 1S NADH ST (GAUZE/BANDAGES/DRESSINGS) ×3 IMPLANT
ELECT LOOP 22F BIPOLAR SML (ELECTROSURGICAL)
ELECT REM PT RETURN 9FT ADLT (ELECTROSURGICAL)
ELECTRODE LOOP 22F BIPOLAR SML (ELECTROSURGICAL) IMPLANT
ELECTRODE REM PT RTRN 9FT ADLT (ELECTROSURGICAL) IMPLANT
FORCEPS BIOPSY 3FR (INSTRUMENTS) ×1 IMPLANT
GAUZE 4X4 16PLY ~~LOC~~+RFID DBL (SPONGE) ×6 IMPLANT
GLOVE SURG UNDER POLY LF SZ7.5 (GLOVE) ×3 IMPLANT
GOWN STRL REUS W/ TWL LRG LVL3 (GOWN DISPOSABLE) ×2 IMPLANT
GOWN STRL REUS W/ TWL XL LVL3 (GOWN DISPOSABLE) ×2 IMPLANT
GOWN STRL REUS W/TWL LRG LVL3 (GOWN DISPOSABLE) ×3
GOWN STRL REUS W/TWL XL LVL3 (GOWN DISPOSABLE) ×3
GOWN STRL REUS W/TWL XL LVL4 (GOWN DISPOSABLE) ×3 IMPLANT
GUIDEWIRE STR DUAL SENSOR (WIRE) ×3 IMPLANT
IV NS IRRIG 3000ML ARTHROMATIC (IV SOLUTION) ×6 IMPLANT
KIT TURNOVER CYSTO (KITS) ×3 IMPLANT
LOOP CUT BIPOLAR 24F LRG (ELECTROSURGICAL) IMPLANT
MANIFOLD NEPTUNE II (INSTRUMENTS) ×2 IMPLANT
PACK CYSTO AR (MISCELLANEOUS) ×3 IMPLANT
SET CYSTO W/LG BORE CLAMP LF (SET/KITS/TRAYS/PACK) ×3 IMPLANT
SET IRRIG Y TYPE TUR BLADDER L (SET/KITS/TRAYS/PACK) ×3 IMPLANT
STENT URET 6FRX24 CONTOUR (STENTS) ×1 IMPLANT
STENT URET 6FRX26 CONTOUR (STENTS) IMPLANT
SURGILUBE 2OZ TUBE FLIPTOP (MISCELLANEOUS) ×3 IMPLANT
SYR TOOMEY IRRIG 70ML (MISCELLANEOUS) ×3
SYRINGE TOOMEY IRRIG 70ML (MISCELLANEOUS) ×2 IMPLANT
WATER STERILE IRR 1000ML POUR (IV SOLUTION) ×2 IMPLANT
WATER STERILE IRR 500ML POUR (IV SOLUTION) ×3 IMPLANT

## 2022-03-14 NOTE — Discharge Instructions (Signed)

## 2022-03-14 NOTE — Op Note (Signed)
Preoperative diagnosis:  ?Right pelvic mass ?Right hydronephrosis secondary obstruction right distal ureter ? ?Postoperative diagnosis:  ?Right distal ureteral mass ?Right hydronephrosis secondary obstruction right distal ureter ? ?Procedure: ?Cystoscopy with right retrograde pyelogram ?Right ureteroscopy with ureteral biopsies ?Right ureteral stent placement ? ?Surgeon: Abbie Sons, MD ? ?Anesthesia: General ? ?Complications: None ? ?Intraoperative findings:  ?Cystoscopy-urethra normal in caliber without stricture.  Prostate with moderate lateral lobe enlargement/bladder neck elevation.  Bladder mucosa without erythema, solid or papillary lesions.  UOs normal-appearing bilaterally ?Right retrograde pyelogram-diffuse narrowing right distal ureter with hydroureter starting upper distal ureter and extending proximally ?Right ureteroscopy-nodular tumor distal ureter and compression in areas without tumor suspicious for extrinsic obstruction ? ?EBL: Minimal ? ?Specimens:  ?Ureteral biopsies ?Saline barbotage right distal ureter for cytology ? ?Indication: [name] [name] is a 81 y.o. 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  After reviewing the management options for treatment, he elected to proceed with the above surgical procedure(s). We have discussed the potential benefits and risks of the procedure, side effects of the proposed treatment, the likelihood of the patient achieving the goals of the procedure, and any potential problems that might occur during the procedure or recuperation. Informed consent has been obtained. ? ?Description of procedure: ? ?The patient was taken to the operating room and general anesthesia was induced.  The patient was placed in the dorsal  lithotomy position, prepped and draped in the usual sterile fashion, and preoperative antibiotics were administered. A preoperative time-out was performed.  ? ?A 21 French cystoscope was lubricated, inserted per urethra and advanced proximally under direct vision with findings as described above. ? ?A 7 French cone-tip catheter was placed through the cystoscope and into the right UO.  Retrograde pyelogram was performed with findings as described above. ? ?A 0.038 Sensor wire was placed through the cystoscope and into the right UO and advanced proximally to the renal pelvis under fluoroscopic guidance without difficulty. ? ?After cystoscope removal a 4.5 French semirigid ureteroscope was then passed per urethra.  The right UO was easily engaged and slowly advanced proximally with findings as described above.  Multiple biopsies were taken of the nodular tumor with Piranha forceps.  A 3 [place] basket was also advanced and additional tissue was obtained. ? ?The guidewire was then backloaded on the cystoscope and a 48F/24 cm Contour ureteral stent was placed without difficulty.  A good curl was noted in the renal pelvis and the distal stent position looked good under direct vision. ? ?The bladder was emptied and cystoscope was removed.  After anesthetic reversal he was transported to the PACU in stable condition. ? ?Plan: ?He will be notified with pathology results and further recommendations will be discussed at that time ? ? ?Abbie Sons, M.D. ? ?

## 2022-03-14 NOTE — Interval H&P Note (Signed)
History and Physical Interval Note: ? ?CV:RRR ?LUNGS:clear ? ?03/14/2022 ?2:14 PM ? ?[name] [name]  has presented today for surgery, with the diagnosis of Right Hydronephrosis, Hydroureter with bladder mass.  The various methods of treatment have been discussed with the patient and family. After consideration of risks, benefits and other options for treatment, the patient has consented to  Procedure(s): ?CYSTOSCOPY WITH STENT PLACEMENT (Right) ?TRANSURETHRAL RESECTION OF BLADDER TUMOR (TURBT) (N/A) ?BLADDER INSTILLATION OF GEMCITABINE (N/A) as a surgical intervention.  The patient's history has been reviewed, patient examined, no change in status, stable for surgery.  I have reviewed the patient's chart and labs.  Questions were answered to the patient's satisfaction.   ? ? ?[name] [name] ? ? ?

## 2022-03-14 NOTE — Transfer of Care (Signed)
Immediate Anesthesia Transfer of Care Note ? ?Patient: [name] [name] ? ?Procedure(s) Performed: TRANSURETHRAL RESECTION OF BLADDER TUMOR (TURBT) ?CYSTOSCOPY WITH RETROGRADE PYELOGRAM/URETERAL STENT PLACEMENT (Right) ? ?Patient Location: PACU ? ?Anesthesia Type:General ? ?Level of Consciousness: drowsy ? ?Airway & Oxygen Therapy: Patient Spontanous Breathing and Patient connected to nasal cannula oxygen ? ?Post-op Assessment: Report given to RN, Post -op Vital signs reviewed and stable and Patient moving all extremities ? ?Post vital signs: Reviewed and stable ? ?Last Vitals:  ?Vitals Value Taken Time  ?BP 130/58 03/14/22 1527  ?Temp    ?Pulse 56 03/14/22 1527  ?Resp 14 03/14/22 1527  ?SpO2 98 % 03/14/22 1527  ? ? ?Last Pain:  ?Vitals:  ? 03/14/22 1257  ?TempSrc: Temporal  ?PainSc: 4   ?   ? ?  ? ?Complications: No notable events documented. ?

## 2022-03-14 NOTE — Anesthesia Preprocedure Evaluation (Signed)
Anesthesia Evaluation  ?Patient identified by MRN, date of birth, ID band ?Patient awake ? ? ? ?Reviewed: ?Allergy & Precautions, H&P , NPO status , Patient's Chart, lab work & pertinent test results ? ?History of Anesthesia Complications ?Negative for: history of anesthetic complications ? ?Airway ?Mallampati: III ? ?TM Distance: <3 FB ?Neck ROM: limited ? ? ? Dental ? ?(+) Chipped, Dental Advidsory Given ?  ?Pulmonary ?neg shortness of breath, sleep apnea and Continuous Positive Airway Pressure Ventilation , neg COPD, neg recent URI, former smoker,  ?  ? ? ? ? ? ? ? Cardiovascular ?Exercise Tolerance: Good ?(-) hypertension(-) angina(-) Past MI and (-) DOE negative cardio ROS ? ?(-) dysrhythmias  ? ? ?  ?Neuro/Psych ?negative neurological ROS ? negative psych ROS  ? GI/Hepatic ?Neg liver ROS, GERD  Medicated and Controlled,  ?Endo/Other  ?negative endocrine ROS ? Renal/GU ?CRFRenal disease  ?negative genitourinary ?  ?Musculoskeletal ? ?(+) Arthritis ,  ? Abdominal ?  ?Peds ? Hematology ?negative hematology ROS ?(+)   ?Anesthesia Other Findings ?Past Medical History: ?No date: Age related osteoporosis ?No date: Barrett's esophagus ?No date: Cataract ?No date: CKD (chronic kidney disease) ?No date: DDD (degenerative disc disease), lumbar ?No date: Femur fracture ([place]) ?    Comment:  Right ?No date: GERD (gastroesophageal reflux disease) ?No date: Hematuria ?No date: History of colon polyps ?No date: Osteopenia of the elderly ?No date: Sleep apnea ? ?Past Surgical History: ?No date: BAND HEMORRHOIDECTOMY ?No date: COLONOSCOPY ?05/06/2018: COLONOSCOPY WITH PROPOFOL; N/A ?    Comment:  Procedure: COLONOSCOPY WITH PROPOFOL;  Surgeon:  ?             Lollie Sails, MD;  Location: [facility] ENDOSCOPY;   ?             Service: Endoscopy;  Laterality: N/A; ?10/03/2016: ESOPHAGOGASTRODUODENOSCOPY (EGD) WITH PROPOFOL; N/A ?    Comment:  Procedure: ESOPHAGOGASTRODUODENOSCOPY (EGD) WITH  ?              PROPOFOL;  Surgeon: Lollie Sails, MD;  Location:  ?             [facility]OSCOPY;  Service: Endoscopy;  Laterality: N/A; ?No date: EYE SURGERY ?2016: FRACTURE SURGERY; Right ?    Comment:  femur ?No date: ORIF DISTAL FEMUR FRACTURE ?No date: TONSILLECTOMY ? ?BMI   ? Body Mass Index:  23.96 kg/m?  ?  ? ? Reproductive/Obstetrics ?negative OB ROS ? ?  ? ? ? ? ? ? ? ? ? ? ? ? ? ?  ?  ? ? ? ? ? ? ? ? ?Anesthesia Physical ? ?Anesthesia Plan ? ?ASA: 2 ? ?Anesthesia Plan: General  ? ?Post-op Pain Management:   ? ?Induction: Intravenous ? ?PONV Risk Score and Plan: Ondansetron, Dexamethasone and Treatment may vary due to age or medical condition ? ?Airway Management Planned: LMA ? ?Additional Equipment:  ? ?Intra-op Plan:  ? ?Post-operative Plan: Extubation in OR ? ?Informed Consent: I have reviewed the patients History and Physical, chart, labs and discussed the procedure including the risks, benefits and alternatives for the proposed anesthesia with the patient or authorized representative who has indicated his/her understanding and acceptance.  ? ? ? ?Dental Advisory Given ? ?Plan Discussed with: Anesthesiologist, CRNA and Surgeon ? ?Anesthesia Plan Comments: (Patient consented for risks of anesthesia including but not limited to:  ?- adverse reactions to medications ?- risk of intubation if required ?- damage to teeth, lips or other oral mucosa ?- sore  throat or hoarseness ?- Damage to heart, brain, lungs or loss of life ? ?Patient voiced understanding.)  ? ? ? ? ? ? ?Anesthesia Quick Evaluation ? ?

## 2022-03-14 NOTE — Anesthesia Postprocedure Evaluation (Signed)
Anesthesia Post Note ? ?Patient: [name] [name] ? ?Procedure(s) Performed: TRANSURETHRAL RESECTION OF BLADDER TUMOR (TURBT) ?CYSTOSCOPY WITH RETROGRADE PYELOGRAM/URETERAL STENT PLACEMENT (Right) ? ?Patient location during evaluation: PACU ?Anesthesia Type: General ?Level of consciousness: awake and alert, oriented and patient cooperative ?Pain management: pain level controlled ?Vital Signs Assessment: post-procedure vital signs reviewed and stable ?Respiratory status: spontaneous breathing, nonlabored ventilation and respiratory function stable ?Cardiovascular status: blood pressure returned to baseline and stable ?Postop Assessment: adequate PO intake ?Anesthetic complications: no ? ? ?No notable events documented. ? ? ?Last Vitals:  ?Vitals:  ? 03/14/22 1550 03/14/22 1606  ?BP: (!) 141/88 140/73  ?Pulse: (!) 56 (!) 48  ?Resp: 15 16  ?Temp: (!) 36.2 ?C (!) 36.2 ?C  ?SpO2: 100% 93%  ?  ?Last Pain:  ?Vitals:  ? 03/14/22 1606  ?TempSrc: Temporal  ?PainSc:   ? ? ?  ?  ?  ?  ?  ?  ? ?Darrin Nipper ? ? ? ? ?

## 2022-03-14 NOTE — Anesthesia Procedure Notes (Signed)
Procedure Name: LMA Insertion ?Date/Time: 03/14/2022 2:30 PM ?Performed by: Esaw Grandchild, CRNA ?Pre-anesthesia Checklist: Patient identified, Emergency Drugs available, Suction available and Patient being monitored ?Patient Re-evaluated:Patient Re-evaluated prior to induction ?Oxygen Delivery Method: Circle system utilized ?Preoxygenation: Pre-oxygenation with 100% oxygen ?Induction Type: IV induction ?Ventilation: Mask ventilation without difficulty ?LMA: LMA inserted ?LMA Size: 4.5 ?Number of attempts: 1 ?Airway Equipment and Method: Oral airway ?Placement Confirmation: positive ETCO2 and breath sounds checked- equal and bilateral ?Tube secured with: Tape ?Dental Injury: Teeth and Oropharynx as per pre-operative assessment  ? ? ? ? ?

## 2022-03-15 ENCOUNTER — Encounter (HOSPITAL_COMMUNITY): Payer: Self-pay | Admitting: Oncology

## 2022-03-15 ENCOUNTER — Encounter: Payer: Self-pay | Admitting: Urology

## 2022-03-15 ENCOUNTER — Ambulatory Visit: Payer: TRICARE For Life (TFL) | Admitting: Oncology

## 2022-03-16 ENCOUNTER — Encounter (HOSPITAL_COMMUNITY): Payer: Self-pay | Admitting: Oncology

## 2022-03-16 LAB — SURGICAL PATHOLOGY

## 2022-03-16 LAB — CYTOLOGY - NON PAP

## 2022-03-17 ENCOUNTER — Encounter: Payer: Self-pay | Admitting: Oncology

## 2022-03-17 ENCOUNTER — Telehealth: Payer: Self-pay | Admitting: Urology

## 2022-03-17 ENCOUNTER — Inpatient Hospital Stay: Payer: Medicare Other | Attending: Oncology | Admitting: Oncology

## 2022-03-17 ENCOUNTER — Inpatient Hospital Stay: Payer: Medicare Other

## 2022-03-17 VITALS — BP 122/61 | HR 53 | Temp 96.8°F | Resp 18 | Wt 144.8 lb

## 2022-03-17 DIAGNOSIS — M810 Age-related osteoporosis without current pathological fracture: Secondary | ICD-10-CM | POA: Diagnosis not present

## 2022-03-17 DIAGNOSIS — D649 Anemia, unspecified: Secondary | ICD-10-CM | POA: Diagnosis not present

## 2022-03-17 DIAGNOSIS — Z86010 Personal history of colonic polyps: Secondary | ICD-10-CM | POA: Insufficient documentation

## 2022-03-17 DIAGNOSIS — R809 Proteinuria, unspecified: Secondary | ICD-10-CM | POA: Insufficient documentation

## 2022-03-17 DIAGNOSIS — C9000 Multiple myeloma not having achieved remission: Secondary | ICD-10-CM | POA: Diagnosis not present

## 2022-03-17 DIAGNOSIS — K219 Gastro-esophageal reflux disease without esophagitis: Secondary | ICD-10-CM | POA: Insufficient documentation

## 2022-03-17 DIAGNOSIS — G4700 Insomnia, unspecified: Secondary | ICD-10-CM | POA: Diagnosis not present

## 2022-03-17 DIAGNOSIS — M5030 Other cervical disc degeneration, unspecified cervical region: Secondary | ICD-10-CM | POA: Insufficient documentation

## 2022-03-17 DIAGNOSIS — Z5111 Encounter for antineoplastic chemotherapy: Secondary | ICD-10-CM | POA: Insufficient documentation

## 2022-03-17 DIAGNOSIS — I700 Atherosclerosis of aorta: Secondary | ICD-10-CM | POA: Diagnosis not present

## 2022-03-17 DIAGNOSIS — K7689 Other specified diseases of liver: Secondary | ICD-10-CM | POA: Insufficient documentation

## 2022-03-17 DIAGNOSIS — Z85028 Personal history of other malignant neoplasm of stomach: Secondary | ICD-10-CM | POA: Diagnosis not present

## 2022-03-17 DIAGNOSIS — N183 Chronic kidney disease, stage 3 unspecified: Secondary | ICD-10-CM | POA: Insufficient documentation

## 2022-03-17 DIAGNOSIS — C669 Malignant neoplasm of unspecified ureter: Secondary | ICD-10-CM | POA: Insufficient documentation

## 2022-03-17 DIAGNOSIS — J439 Emphysema, unspecified: Secondary | ICD-10-CM | POA: Diagnosis not present

## 2022-03-17 DIAGNOSIS — Z7189 Other specified counseling: Secondary | ICD-10-CM | POA: Diagnosis not present

## 2022-03-17 LAB — BASIC METABOLIC PANEL
Anion gap: 8 (ref 5–15)
BUN: 46 mg/dL — ABNORMAL HIGH (ref 8–23)
CO2: 22 mmol/L (ref 22–32)
Calcium: 8.7 mg/dL — ABNORMAL LOW (ref 8.9–10.3)
Chloride: 105 mmol/L (ref 98–111)
Creatinine, Ser: 3.59 mg/dL — ABNORMAL HIGH (ref 0.61–1.24)
GFR, Estimated: 16 mL/min — ABNORMAL LOW (ref >60)
Glucose, Bld: 106 mg/dL — ABNORMAL HIGH (ref 70–99)
Potassium: 4.8 mmol/L (ref 3.5–5.1)
Sodium: 135 mmol/L (ref 135–145)

## 2022-03-17 NOTE — Progress Notes (Signed)
?Hematology/Oncology Progress note ?[phone] [phone] ?  ? ? ? ?Patient Care Team: ?Baxter Hire, MD as PCP - General (Internal Medicine) ?Noreene Filbert, MD as Radiation Oncologist (Radiation Oncology) ?Baxter Hire, MD (Internal Medicine) ?Lavonia Dana, MD as Consulting Physician (Nephrology) ?[name], MD as Consulting Physician (Oncology) ? ?REFERRING PROVIDER: ?Baxter Hire, MD ? ?CHIEF COMPLAINTS/REASON FOR VISIT:  ?Multiple myeloma, high-grade urothelial carcinoma. ? ?HISTORY OF PRESENTING ILLNESS:  ?[name] [name] is a 81 y.o. male who was seen in consultation at the request of Baxter Hire, MD for evaluation of abnormal SPEP results.  ? ?Patient recently had work up done at nephrologist's office. Labs reviewed,  ?Patient has progressively worsening kidney function.  He is scheduled for kidney biopsy. ?01/26/2022, free kappa light chain 1058, lambda 15.3, light chain ratio 69. ?Protein electrophoresis showed restricted band-M spike migrating in the beta-1 globulin region. ?ANA negative.  ANCA negative. ?Random urine protein electrophoresis showed abnormal protein band detected in the gamma globulin And a second possible abnormal protein band that may represent monoclonal protein. ? ?Patient denies any back pain, unintentional weight loss, night sweats or fever. ?He lives at home with wife. ? 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 ?INTERVAL HISTORY ?[name] [name] is a 81 y.o. male who has above history reviewed by me today presents for follow up visit to review results. ?02/23/2022 multiple myeloma showed IgA 2883, M protein of 1.8, beta 2 microglobulin 6.9, kappa free light chain 1268.9, lambda 13.2, free light chain ratio 96.13.   ?CMP showed creatinine of 3.41, that is significantly worse than his baseline in June 2022. ?02/28/2022, 24-hour urine protein electrophoresis showed M protein of 729 mg, IgA  monoclonal kappa light chain. ?03/01/2022, [facility] kidney biopsy showed diffuse light chain tubulopathy, and light chain cast nephropathy, moderate interstitial fibrosis,4% global glomerular sclerosis and the marked atherosclerosis. ? ?03/02/2022 PET scan showed no definitive signs of multiple myeloma by FDG PET. ?Obstructive right ureter with soft tissue mass in the right pelvis showing increased metabolic activity suspicious for urothelial neoplasm.  Little or no excretion of FDG on the RIGHT but still with uptake of FDG by renal parenchyma. Degree of hydronephrosis is not substantially changed but lack of FDG excretion on the RIGHT is compatible with secondary sign of physiologic significance of ?ureteral obstruction. RIGHT pelvic sidewall uptake without visible lesion, could represent tiny lymph node not visible on noncontrast imaging. If the patient is no longer and acute renal failure could consider MRI with and without contrast for further assessment of pelvic findings. Ultimately cystoscopy may be helpful for further assessment, aortic atherosclerosis, pulmonary emphysema. ? ?03/06/2022, patient underwent a bone marrow biopsy. ?Pathology showed hypercellular bone marrow with plasma cell neoplasm, representing 20% of all cells in the aspirate associated with prominent interstitial infiltrates and numerous predominantly small clusters in the clots in the biopsy sections.  The plasma cells display kappa light chain restriction consistent with plasma cell neoplasm. ?Normal cytogenetics, myeloma FISH panel positive for 13q deletion, gain of 1q, t (4;14) ? ?Patient received 20 mg dexamethasone x1 after bone marrow biopsy. ? ?03/14/2022, cystoscopy showed diffuse narrowing right distal ureter with hydroureter following up the distal ureter and extending proximally.  Right ureteroscopy showed nodular tumor distal ureter and a convincing area without tumor suspicious for extrinsic obstruction.s/p   right ureteral stent  placement. Ureteral tumor showed a tiny fragments of fibrous tissue, interpretation limited by thermal and crush  artifact.  Right distal ureter saline barbotage showed hight grade urothelial carcinoma.  ? ? ? ?Review of Systems  ?Constitutional:  Positive for fatigue. Negative for appetite change, chills, fever and unexpected weight change.  ?HENT:   Negative for hearing loss and voice change.   ?Eyes:  Negative for eye problems and icterus.  ?Respiratory:  Negative for chest tightness, cough and shortness of breath.   ?Cardiovascular:  Negative for chest pain and leg swelling.  ?Gastrointestinal:  Negative for abdominal distention and abdominal pain.  ?Endocrine: Negative for hot flashes.  ?Genitourinary:  Negative for difficulty urinating, dysuria and frequency.   ?Musculoskeletal:  Negative for arthralgias.  ?Skin:  Negative for itching and rash.  ?Neurological:  Negative for light-headedness and numbness.  ?Hematological:  Negative for adenopathy. Does not bruise/bleed easily.  ?Psychiatric/Behavioral:  Negative for confusion.   ? ? ?MEDICAL HISTORY:  ?Past Medical History:  ?Diagnosis Date  ? Age related osteoporosis   ? Anemia   ? Barrett's esophagus   ? Bradycardia   ? Cancer [facility])   ? PROSTATE  ? Cataract   ? CKD (chronic kidney disease) stage 3, GFR 30-59 ml/min (HCC)   ? Colon polyps   ? DDD (degenerative disc disease), cervical   ? DDD (degenerative disc disease), lumbar   ? Femur fracture ([place])   ? Right  ? Fundic gland polyposis of stomach   ? Fundic gland polyps of stomach, benign   ? Gastritis   ? GERD (gastroesophageal reflux disease)   ? Hematuria   ? Hemorrhage of rectum and anus   ? History of colon polyps   ? Insomnia   ? Lumbago   ? Osteopenia of the elderly   ? Skin cancer   ? Sleep apnea   ? ? ?SURGICAL HISTORY: ?Past Surgical History:  ?Procedure Laterality Date  ? BAND HEMORRHOIDECTOMY    ? COLONOSCOPY    ? COLONOSCOPY WITH PROPOFOL N/A 05/06/2018  ? Procedure: COLONOSCOPY WITH PROPOFOL;   Surgeon: Lollie Sails, MD;  Location: [facility] ENDOSCOPY;  Service: Endoscopy;  Laterality: N/A;  ? COLONOSCOPY WITH PROPOFOL N/A 09/23/2018  ? Procedure: COLONOSCOPY WITH PROPOFOL;  Surgeon: Lollie Sails, MD;  [facility] ENDOSCOPY;  Service: Endoscopy;  Laterality: N/A;  ? COLONOSCOPY WITH PROPOFOL N/A 03/30/2021  ? Procedure: COLONOSCOPY WITH PROPOFOL;  Surgeon: Toledo, Benay Pike, MD;  Location: [facility] ENDOSCOPY;  Service: Gastroenterology;  Laterality: N/A;  ? CYSTOSCOPY W/ URETERAL STENT PLACEMENT Right 03/14/2022  ? Procedure: CYSTOSCOPY WITH RETROGRADE PYELOGRAM/URETERAL STENT PLACEMENT;  Surgeon: Abbie Sons, MD;  Location: [facility] ORS;  Service: Urology;  Laterality: Right;  ? ESOPHAGOGASTRODUODENOSCOPY (EGD) WITH PROPOFOL N/A 10/03/2016  ? Procedure: ESOPHAGOGASTRODUODENOSCOPY (EGD) WITH PROPOFOL;  Surgeon: Lollie Sails, MD;  Location: [facility] ENDOSCOPY;  Service: Endoscopy;  Laterality: N/A;  ? EYE SURGERY    ? CATARACTS  ? EYELID SURGERY    ? FLEXIBLE SIGMOIDOSCOPY    ? FRACTURE SURGERY Right 2016  ? femur  ? FRACTURE SURGERY    ? ORIF DISTAL FEMUR FRACTURE    ? TONSILLECTOMY    ? TRANSURETHRAL RESECTION OF BLADDER TUMOR N/A 03/14/2022  ? Procedure: TRANSURETHRAL RESECTION OF BLADDER TUMOR (TURBT);  Surgeon: Abbie Sons, MD;  Location: [facility] ORS;  Service: Urology;  Laterality: N/A;  ? ? ?SOCIAL HISTORY: ?Social History  ? ?Socioeconomic History  ? Marital status: Married  ?  Spouse name: Not on file  ? Number of children: Not on file  ? Years of education: Not  on file  ? Highest education level: Not on file  ?Occupational History  ? Not on file  ?Tobacco Use  ? Smoking status: Former  ?  Packs/day: 1.00  ?  Years: 15.00  ?  Pack years: 15.00  ?  Types: Cigarettes  ?  Quit date: 10/03/1974  ?  Years since quitting: 47.4  ? Smokeless tobacco: Never  ?Vaping Use  ? Vaping Use: Never used  ?Substance and Sexual Activity  ? Alcohol use: Not Currently  ?  Alcohol/week: 1.0 standard drink  ?   Types: 1 Cans of beer per week  ?  Comment: 1 beer every 2 weeks  ? Drug use: No  ? Sexual activity: Yes  ?  Birth control/protection: None  ?Other Topics Concern  ? Not on file  ?Social History Narrative  ? ** Me

## 2022-03-17 NOTE — Progress Notes (Signed)
START ON PATHWAY REGIMEN - Multiple Myeloma and Other Plasma Cell Dyscrasias ? ? ?  A cycle is every 28 days: ?    Cyclophosphamide  ?    Dexamethasone  ?    Bortezomib  ? ?**Always confirm dose/schedule in your pharmacy ordering system** ? ?Patient Characteristics: ?Multiple Myeloma, Newly Diagnosed, Transplant Ineligible or Refused, High Risk ?Disease Classification: Multiple Myeloma ?R-ISS Staging: III ?Therapeutic Status: Newly Diagnosed ?Is Patient Eligible for Transplant<= Transplant Ineligible or Refused ?Risk Status: High Risk ?Intent of Therapy: ?Non-Curative / Palliative Intent, Discussed with Patient ?

## 2022-03-17 NOTE — Progress Notes (Signed)
Pt c/o upper back pain since Tuesday going across his back has been taking tylenol and it seems to help. Currently on [place] for two weeks on day 4 today.  ?

## 2022-03-17 NOTE — Telephone Encounter (Signed)
Mr. Thelander underwent cystoscopy/right ureteroscopy earlier this week with findings of a nodular, high-grade appearing distal ureteral tumor.  Multiple biopsies were obtained which were nondiagnostic however a saline barbotage was also performed and cells return high-grade urothelial carcinoma.  CT/PET suspicious for extra ureteral involvement.  Pathology report and findings were discussed with Mr. Cavins.  He had an appointment with Dr. Tasia Catchings this morning and he indicated she was going to contact me to discuss diagnosis and plan further.  Will also asked Dr. Erlene Quan to review.  ?

## 2022-03-20 ENCOUNTER — Encounter: Payer: Self-pay | Admitting: Oncology

## 2022-03-20 ENCOUNTER — Telehealth: Payer: Self-pay | Admitting: Emergency Medicine

## 2022-03-20 ENCOUNTER — Other Ambulatory Visit: Payer: Self-pay | Admitting: Oncology

## 2022-03-20 ENCOUNTER — Ambulatory Visit: Payer: Medicare Other | Admitting: Urology

## 2022-03-20 MED ORDER — MONTELUKAST SODIUM 10 MG PO TABS: 10.0000 mg | ORAL_TABLET | ORAL | 1 refills | Status: DC

## 2022-03-20 MED ORDER — ONDANSETRON HCL 8 MG PO TABS: 8.0000 mg | ORAL_TABLET | Freq: Two times a day (BID) | ORAL | 1 refills | Status: DC | PRN

## 2022-03-20 MED ORDER — DEXAMETHASONE 4 MG PO TABS: 20.0000 mg | ORAL_TABLET | ORAL | 0 refills | Status: DC

## 2022-03-20 NOTE — Progress Notes (Signed)
DISCONTINUE ON PATHWAY REGIMEN - Multiple Myeloma and Other Plasma Cell Dyscrasias ? ? ?  A cycle is every 28 days: ?    Cyclophosphamide  ?    Dexamethasone  ?    Bortezomib  ? ?**Always confirm dose/schedule in your pharmacy ordering system** ? ?REASON: Other Reason ?PRIOR TREATMENT: [id number]:  CyBord (Cyclophosphamide 300 mg/m2 PO D1, 8, 15, 22 + Bortezomib 1.5 mg/m2 SUBQ D1, 8, 15, 22 + Dexamethasone 40 mg PO D1, 8, 15, 22) q28 Days x 8 Cycles ?TREATMENT RESPONSE: Stable Disease (SD) ? ?START ON PATHWAY REGIMEN - Multiple Myeloma and Other Plasma Cell Dyscrasias ? ? ?  Cycle 1 and 2: A cycle is every 28 days: ?    Lenalidomide  ?    Dexamethasone  ?    Daratumumab  ?  Cycles 3 through 6: A cycle is every 28 days: ?    Lenalidomide  ?    Dexamethasone  ?    Daratumumab  ?  Cycles 7 and beyond: A cycle is every 28 days: ?    Lenalidomide  ?    Dexamethasone  ?    Daratumumab  ? ?**Always confirm dose/schedule in your pharmacy ordering system** ? ?Patient Characteristics: ?Multiple Myeloma, Newly Diagnosed, Transplant Ineligible or Refused, High Risk ?Disease Classification: Multiple Myeloma ?R-ISS Staging: III ?Therapeutic Status: Newly Diagnosed ?Is Patient Eligible for Transplant<= Transplant Ineligible or Refused ?Risk Status: High Risk ?Intent of Therapy: ?Non-Curative / Palliative Intent, Discussed with Patient ?

## 2022-03-20 NOTE — Telephone Encounter (Signed)
Called patient to discuss kidney function results and to make pt aware of dexamethasone rx sent to pharmacy. Pt states he has appt with Dr. Elenor Quinones on 03/22/22 to discuss referral to [facility] urology as he states Dr. Elenor Quinones is not comfortable with proceeding with surgery. Pt states he is anxious to get some sort of treatment started.  ?

## 2022-03-20 NOTE — Addendum Note (Signed)
Addended by: [name] on: 03/20/2022 11:54 PM ? ? Modules accepted: Orders ? ?

## 2022-03-20 NOTE — Addendum Note (Signed)
Addended by: [name] on: 03/20/2022 11:49 PM ? ? Modules accepted: Orders ? ?

## 2022-03-21 ENCOUNTER — Telehealth: Payer: Self-pay

## 2022-03-21 NOTE — Telephone Encounter (Signed)
-----   Message from [name], MD sent at 03/20/2022 11:50 PM EDT ----- ?Please schedule patient to have chemo class for daratumumab, revlimid, velcade, ASAP.  ?I plan to just start on Daratumumab, pending urology recommendation of surgery.  ?Please schedule lab md daratumumab injection. , preferably this week [ Friday?] if possible. I sent premeds, and singulair to his pharmacy, he needs to take 1 tab before treatment, 1 tab daily for 2 days after treatment. I will further go over his plan during his appt.  ? ?

## 2022-03-21 NOTE — Telephone Encounter (Signed)
Called and spoke with patient to go over Dr. Collie Siad plan for Lab/MD/Dara on Friday. Informed patient that Dr. Tasia Catchings sent pre med (Singulair) to pharmacy to take Thursday night and then 1 tablet daily for 2 days post tx. Patient verbalized understanding.  Advised patient that he will also be scheduled for CHEMO class to get a better understanding of what to expect with his tx. Patient voiced questions and concerns about chemo regiment. Advised patient to write all questions down so he can address these concerns at his appt on Friday. Patient verbalized understanding. He was very grateful for the call.  ? ? ? ?Please schedule patient for: ?CHEMO class this week. (Dara/Revlimid/Velcade) ?Lab/MD/Dara injection on Friday  ?Please notify patient of appts. Patient is aware. Thanks ?

## 2022-03-22 ENCOUNTER — Telehealth: Payer: Self-pay | Admitting: Urology

## 2022-03-22 ENCOUNTER — Encounter: Payer: Self-pay | Admitting: Urology

## 2022-03-22 ENCOUNTER — Other Ambulatory Visit: Payer: Self-pay | Admitting: Urology

## 2022-03-22 ENCOUNTER — Ambulatory Visit (INDEPENDENT_AMBULATORY_CARE_PROVIDER_SITE_OTHER): Payer: Medicare Other | Admitting: Urology

## 2022-03-22 ENCOUNTER — Encounter: Payer: Self-pay | Admitting: Oncology

## 2022-03-22 VITALS — BP 127/53 | HR 48 | Ht 67.0 in | Wt 148.0 lb

## 2022-03-22 DIAGNOSIS — N135 Crossing vessel and stricture of ureter without hydronephrosis: Secondary | ICD-10-CM

## 2022-03-22 DIAGNOSIS — C669 Malignant neoplasm of unspecified ureter: Secondary | ICD-10-CM

## 2022-03-22 DIAGNOSIS — N1330 Unspecified hydronephrosis: Secondary | ICD-10-CM

## 2022-03-22 MED ORDER — GEMTESA 75 MG PO TABS: 75.0000 mg | ORAL_TABLET | Freq: Every day | ORAL | 0 refills | Status: DC

## 2022-03-22 NOTE — Progress Notes (Signed)
? ?03/22/2022 ?4:18 PM  ? ?[name] [name] ?May 07, 1941 ?[id number] ? ?Referring provider: Baxter Hire, MD ?[address] ?[place],  [place] [address] ? ?Chief Complaint  ?Patient presents with  ? Other  ? ? ?HPI: ?81 y.o. male presents for postop follow-up. ? ?Status post right ureteroscopy 4/11 with findings of high-grade, nodular distal ureteral tumor.  Biopsies were nondiagnostic however saline barbotage return high-grade urothelial carcinoma ?Report was discussed with patient and his wife last week ?Dr. Tasia Catchings plans to start chemotherapy for multiple myeloma ?Creatinine 3 days after stent placement did not decrease ?Urinary frequency/urgency post stent placement-on trospium which has helped ? ? ?PMH: ?Past Medical History:  ?Diagnosis Date  ? Age related osteoporosis   ? Anemia   ? Barrett's esophagus   ? Bradycardia   ? Cancer [facility])   ? PROSTATE  ? Cataract   ? CKD (chronic kidney disease) stage 3, GFR 30-59 ml/min (HCC)   ? Colon polyps   ? DDD (degenerative disc disease), cervical   ? DDD (degenerative disc disease), lumbar   ? Femur fracture ([place])   ? Right  ? Fundic gland polyposis of stomach   ? Fundic gland polyps of stomach, benign   ? Gastritis   ? GERD (gastroesophageal reflux disease)   ? Hematuria   ? Hemorrhage of rectum and anus   ? History of colon polyps   ? Insomnia   ? Lumbago   ? Osteopenia of the elderly   ? Skin cancer   ? Sleep apnea   ? ? ?Surgical History: ?Past Surgical History:  ?Procedure Laterality Date  ? BAND HEMORRHOIDECTOMY    ? COLONOSCOPY    ? COLONOSCOPY WITH PROPOFOL N/A 05/06/2018  ? Procedure: COLONOSCOPY WITH PROPOFOL;  Surgeon: Lollie Sails, MD;  Location[facility] ENDOSCOPY;  Service: Endoscopy;  Laterality: N/A;  ? COLONOSCOPY WITH PROPOFOL N/A 09/23/2018  ? Procedure: COLONOSCOPY WITH PROPOFOL;  Surgeon: Lollie Sails, MD;  Location: [facility] ENDOSCOPY;  Service: Endoscopy;  Laterality: N/A;  ? COLONOSCOPY WITH PROPOFOL N/A 03/30/2021  ? Procedure: COLONOSCOPY WITH  PROPOFOL;  Surgeon: Toledo, Benay Pike, MD;  Location: [facility] ENDOSCOPY;  Service: Gastroenterology;  Laterality: N/A;  ? CYSTOSCOPY W/ URETERAL STENT PLACEMENT Right 03/14/2022  ? Procedure: CYSTOSCOPY WITH RETROGRADE PYELOGRAM/URETERAL STENT PLACEMENT;  Surgeon: Abbie Sons, MD;  Location: [facility] ORS;  Service: Urology;  Laterality: Right;  ? ESOPHAGOGASTRODUODENOSCOPY (EGD) WITH PROPOFOL N/A 10/03/2016  ? Procedure: ESOPHAGOGASTRODUODENOSCOPY (EGD) WITH PROPOFOL;  Surgeon: Lollie Sails, MD;  Location: [facility] ENDOSCOPY;  Service: Endoscopy;  Laterality: N/A;  ? EYE SURGERY    ? CATARACTS  ? EYELID SURGERY    ? FLEXIBLE SIGMOIDOSCOPY    ? FRACTURE SURGERY Right 2016  ? femur  ? FRACTURE SURGERY    ? ORIF DISTAL FEMUR FRACTURE    ? TONSILLECTOMY    ? TRANSURETHRAL RESECTION OF BLADDER TUMOR N/A 03/14/2022  ? Procedure: TRANSURETHRAL RESECTION OF BLADDER TUMOR (TURBT);  Surgeon: Abbie Sons, MD;  Location: [facility] ORS;  Service: Urology;  Laterality: N/A;  ? ? ?Home Medications:  ?Allergies as of 03/22/2022   ? ?   Reactions  ? Demerol [meperidine Hcl] Other (See Comments)  ? "I pass out"  ? Demerol [meperidine] Nausea And Vomiting, Other (See Comments)  ? Patient passed out.  ? Nsaids Other (See Comments)  ? Not supposed to take NSAIDS per Dr Gustavo Lah due to Barrett's Esophagus history ?Per Nephrologist, not to take NSAIDS  ? ?  ? ?  ?  Medication List  ?  ? ?  ? Accurate as of March 22, 2022  4:18 PM. If you have any questions, ask your nurse or doctor.  ?  ?  ? ?  ? ?STOP taking these medications   ? ?acetaminophen 500 MG tablet ?Commonly known as: TYLENOL ?Stopped by: Abbie Sons, MD ?  ?trospium 20 MG tablet ?Commonly known as: SANCTURA ?Stopped by: Abbie Sons, MD ?  ? ?  ? ?TAKE these medications   ? ?dexamethasone 4 MG tablet ?Commonly known as: DECADRON ?Take 5 tablets (20 mg total) by mouth once a week. ?  ?ferrous sulfate 325 (65 FE) MG tablet ?Take 325 mg by mouth daily. ?  ?Gemtesa 75 MG  Tabs ?Generic drug: Vibegron ?Take 75 mg by mouth daily. ?Started by: Abbie Sons, MD ?  ?HYDROcodone-acetaminophen 7.5-325 MG tablet ?Commonly known as: Norco ?0.5-1 tab every 6 hours as needed for pain ?  ?ipratropium 0.03 % nasal spray ?Commonly known as: ATROVENT ?Place 1 spray into both nostrils in the morning. ?  ?losartan 25 MG tablet ?Commonly known as: COZAAR ?Take 25 mg by mouth daily. ?  ?Mens 50+ Multivitamin Tabs ?Take 1 tablet by mouth daily. ?  ?montelukast 10 MG tablet ?Commonly known as: Singulair ?Take 1 tablet (10 mg total) by mouth See admin instructions. Take 1 tablet the day prior to Daratumumab treatments, and take 1 tablet daily for 2 days after treatments ?  ?ondansetron 8 MG tablet ?Commonly known as: Zofran ?Take 1 tablet (8 mg total) by mouth 2 (two) times daily as needed (Nausea or vomiting). ?  ?traZODone 50 MG tablet ?Commonly known as: DESYREL ?Take 25 mg by mouth at bedtime. ?  ?Vitamin D3 125 MCG (5000 UT) Caps ?Take 5,000 Units by mouth daily. ?  ? ?  ? ? ?Allergies:  ?Allergies  ?Allergen Reactions  ? Demerol [Meperidine Hcl] Other (See Comments)  ?  "I pass out"  ? Demerol [Meperidine] Nausea And Vomiting and Other (See Comments)  ?  Patient passed out.  ? Nsaids Other (See Comments)  ?  Not supposed to take NSAIDS per Dr Gustavo Lah due to Barrett's Esophagus history ?Per Nephrologist, not to take NSAIDS  ? ? ?Family History: ?Family History  ?Problem Relation Age of Onset  ? Breast cancer Mother   ? ? ?Social History:  reports that he quit smoking about 47 years ago. His smoking use included cigarettes. He has a 15.00 pack-year smoking history. He has never used smokeless tobacco. He reports that he does not currently use alcohol after a past usage of about 1.0 standard drink per week. He reports that he does not use drugs. ? ? ?Physical Exam: ?BP (!) 127/53   Pulse (!) 48   Ht '5\' 7"'  (1.702 m)   Wt 148 lb (67.1 kg)   BMI 23.18 kg/m?   ?Constitutional:  Alert and oriented,  No acute distress. ?Psychiatric: Normal mood and affect. ? ? ?Assessment & Plan:   ? ?1.  High-grade urothelial carcinoma right distal ureter ?Based on CT he has extra-ureteral tumor ?He has requested referral to [facility] urologic oncology with which I am in agreement ?We discussed possible treatments of distal ureterectomy with/without chemotherapy ?Creatinine level currently at 3.59 which Dr. Tasia Catchings thinks may be related to multiple myeloma which complicates treatment regarding chemotherapy ? ? ?Abbie Sons, MD ? ?[facility] ?[address] ?[place], [place] [address] ?[phone] ? ?

## 2022-03-22 NOTE — Telephone Encounter (Signed)
Pt called office to find out why his 5/3 appt was canceled.   ?

## 2022-03-22 NOTE — Telephone Encounter (Signed)
Dr. Bernardo Heater c/x app  ?

## 2022-03-23 ENCOUNTER — Inpatient Hospital Stay: Payer: Medicare Other

## 2022-03-24 ENCOUNTER — Inpatient Hospital Stay: Payer: Medicare Other

## 2022-03-24 ENCOUNTER — Encounter: Payer: Self-pay | Admitting: Urology

## 2022-03-24 ENCOUNTER — Encounter: Payer: Self-pay | Admitting: Oncology

## 2022-03-24 ENCOUNTER — Inpatient Hospital Stay (HOSPITAL_BASED_OUTPATIENT_CLINIC_OR_DEPARTMENT_OTHER): Payer: Medicare Other | Admitting: Oncology

## 2022-03-24 ENCOUNTER — Telehealth: Payer: Self-pay | Admitting: Urology

## 2022-03-24 VITALS — BP 136/57 | HR 59 | Temp 96.0°F | Resp 18

## 2022-03-24 VITALS — BP 129/57 | HR 49 | Temp 97.2°F | Resp 18 | Ht 67.0 in | Wt 147.5 lb

## 2022-03-24 DIAGNOSIS — Z7189 Other specified counseling: Secondary | ICD-10-CM | POA: Diagnosis not present

## 2022-03-24 DIAGNOSIS — C9000 Multiple myeloma not having achieved remission: Secondary | ICD-10-CM

## 2022-03-24 DIAGNOSIS — C669 Malignant neoplasm of unspecified ureter: Secondary | ICD-10-CM

## 2022-03-24 DIAGNOSIS — N184 Chronic kidney disease, stage 4 (severe): Secondary | ICD-10-CM | POA: Diagnosis not present

## 2022-03-24 DIAGNOSIS — D631 Anemia in chronic kidney disease: Secondary | ICD-10-CM

## 2022-03-24 DIAGNOSIS — Z5111 Encounter for antineoplastic chemotherapy: Secondary | ICD-10-CM | POA: Diagnosis not present

## 2022-03-24 LAB — CBC WITH DIFFERENTIAL/PLATELET
Abs Immature Granulocytes: 0.01 10*3/uL (ref 0.00–0.07)
Basophils Absolute: 0 10*3/uL (ref 0.0–0.1)
Basophils Relative: 0 %
Eosinophils Absolute: 0.3 10*3/uL (ref 0.0–0.5)
Eosinophils Relative: 6 %
HCT: 33.6 % — ABNORMAL LOW (ref 39.0–52.0)
Hemoglobin: 10.9 g/dL — ABNORMAL LOW (ref 13.0–17.0)
Immature Granulocytes: 0 %
Lymphocytes Relative: 14 %
Lymphs Abs: 0.8 10*3/uL (ref 0.7–4.0)
MCH: 29.7 pg (ref 26.0–34.0)
MCHC: 32.4 g/dL (ref 30.0–36.0)
MCV: 91.6 fL (ref 80.0–100.0)
Monocytes Absolute: 0.6 10*3/uL (ref 0.1–1.0)
Monocytes Relative: 11 %
Neutro Abs: 3.8 10*3/uL (ref 1.7–7.7)
Neutrophils Relative %: 69 %
Platelets: 170 10*3/uL (ref 150–400)
RBC: 3.67 MIL/uL — ABNORMAL LOW (ref 4.22–5.81)
RDW: 13.9 % (ref 11.5–15.5)
WBC: 5.6 10*3/uL (ref 4.0–10.5)
nRBC: 0 % (ref 0.0–0.2)

## 2022-03-24 LAB — TYPE AND SCREEN
ABO/RH(D): O POS
Antibody Screen: NEGATIVE

## 2022-03-24 LAB — COMPREHENSIVE METABOLIC PANEL
ALT: 13 U/L (ref 0–44)
AST: 17 U/L (ref 15–41)
Albumin: 3.2 g/dL — ABNORMAL LOW (ref 3.5–5.0)
Alkaline Phosphatase: 74 U/L (ref 38–126)
Anion gap: 8 (ref 5–15)
BUN: 53 mg/dL — ABNORMAL HIGH (ref 8–23)
CO2: 25 mmol/L (ref 22–32)
Calcium: 8.7 mg/dL — ABNORMAL LOW (ref 8.9–10.3)
Chloride: 105 mmol/L (ref 98–111)
Creatinine, Ser: 3.46 mg/dL — ABNORMAL HIGH (ref 0.61–1.24)
GFR, Estimated: 17 mL/min — ABNORMAL LOW (ref >60)
Glucose, Bld: 81 mg/dL (ref 70–99)
Potassium: 4.6 mmol/L (ref 3.5–5.1)
Sodium: 138 mmol/L (ref 135–145)
Total Bilirubin: 0.4 mg/dL (ref 0.3–1.2)
Total Protein: 7.4 g/dL (ref 6.5–8.1)

## 2022-03-24 MED ORDER — SODIUM CHLORIDE 0.9 % IV SOLN
20.0000 mg | Freq: Once | INTRAVENOUS | Status: AC
  Administered 2022-03-24: 20 mg via INTRAVENOUS
  Filled 2022-03-24: qty 2

## 2022-03-24 MED ORDER — DIPHENHYDRAMINE HCL 25 MG PO CAPS
50.0000 mg | ORAL_CAPSULE | Freq: Once | ORAL | Status: AC
  Administered 2022-03-24: 50 mg via ORAL
  Filled 2022-03-24: qty 2

## 2022-03-24 MED ORDER — MONTELUKAST SODIUM 10 MG PO TABS
10.0000 mg | ORAL_TABLET | Freq: Once | ORAL | Status: AC
  Administered 2022-03-24: 10 mg via ORAL
  Filled 2022-03-24: qty 1

## 2022-03-24 MED ORDER — SODIUM CHLORIDE 0.9 % IV SOLN
Freq: Once | INTRAVENOUS | Status: AC
  Filled 2022-03-24: qty 250

## 2022-03-24 MED ORDER — ACETAMINOPHEN 325 MG PO TABS
650.0000 mg | ORAL_TABLET | Freq: Once | ORAL | Status: AC
  Administered 2022-03-24: 650 mg via ORAL
  Filled 2022-03-24: qty 2

## 2022-03-24 MED ORDER — DARATUMUMAB-HYALURONIDASE-FIHJ 1800-30000 MG-UT/15ML ~~LOC~~ SOLN
1800.0000 mg | Freq: Once | SUBCUTANEOUS | Status: AC
  Administered 2022-03-24: 1800 mg via SUBCUTANEOUS
  Filled 2022-03-24: qty 15

## 2022-03-24 MED ORDER — ACYCLOVIR 200 MG PO CAPS: 200.0000 mg | ORAL_CAPSULE | Freq: Two times a day (BID) | ORAL | 3 refills | Status: DC

## 2022-03-24 NOTE — Telephone Encounter (Signed)
[name] was seen earlier this week and had requested [name] [name] referral.  I have been in correspondence with Dr. Thurmond [name] by email today.  He had recommended an [name] [name] appointment and will help to expedite.  I contacted [name] to update regarding recommendations. ? ? ?

## 2022-03-24 NOTE — Patient Instructions (Signed)
[facility] AT [place]  Discharge Instructions: ?Thank you for choosing [facility] to provide your oncology and hematology care.  ?If you have a lab appointment with the [facility], please go directly to the [facility] and check in at the registration area. ? ?Wear comfortable clothing and clothing appropriate for easy access to any Portacath or PICC line.  ? ?We strive to give you quality time with your provider. You may need to reschedule your appointment if you arrive late (15 or more minutes).  Arriving late affects you and other patients whose appointments are after yours.  Also, if you miss three or more appointments without notifying the office, you may be dismissed from the clinic at the provider?s discretion.    ?  ?For prescription refill requests, have your pharmacy contact our office and allow 72 hours for refills to be completed.   ? ?Today you received the following chemotherapy and/or immunotherapy agents Daratumumab    ?  ?To help prevent nausea and vomiting after your treatment, we encourage you to take your nausea medication as directed. ? ?BELOW ARE SYMPTOMS THAT SHOULD BE REPORTED IMMEDIATELY: ?*FEVER GREATER THAN 100.4 F (38 ?C) OR HIGHER ?*CHILLS OR SWEATING ?*NAUSEA AND VOMITING THAT IS NOT CONTROLLED WITH YOUR NAUSEA MEDICATION ?*UNUSUAL SHORTNESS OF BREATH ?*UNUSUAL BRUISING OR BLEEDING ?*URINARY PROBLEMS (pain or burning when urinating, or frequent urination) ?*BOWEL PROBLEMS (unusual diarrhea, constipation, pain near the anus) ?TENDERNESS IN MOUTH AND THROAT WITH OR WITHOUT PRESENCE OF ULCERS (sore throat, sores in mouth, or a toothache) ?UNUSUAL RASH, SWELLING OR PAIN  ?UNUSUAL VAGINAL DISCHARGE OR ITCHING  ? ?Items with * indicate a potential emergency and should be followed up as soon as possible or go to the Emergency Department if any problems should occur. ? ?Please show the CHEMOTHERAPY ALERT CARD or IMMUNOTHERAPY ALERT CARD at check-in to  the Emergency Department and triage nurse. ? ?Should you have questions after your visit or need to cancel or reschedule your appointment, please contact [facility] [place] AT [place]  [phone] and follow the prompts.  Office hours are 8:00 a.m. to 4:30 p.m. Monday - Friday. Please note that voicemails left after 4:00 p.m. may not be returned until the following business day.  We are closed weekends and major holidays. You have access to a nurse at all times for urgent questions. Please call the main number to the clinic [phone] and follow the prompts. ? ?For any non-urgent questions, you may also contact your provider using MyChart. We now offer e-Visits for anyone 3 and older to request care online for non-urgent symptoms. For details visit [email or web address]. ?  ?Also download the MyChart app! Go to the app store, search "MyChart", open the app, select [facility], and log in with your MyChart username and password. ? ?Due to Covid, a mask is required upon entering the hospital/clinic. If you do not have a mask, one will be given to you upon arrival. For doctor visits, patients may have 1 support person aged 56 or older with them. For treatment visits, patients cannot have anyone with them due to current Covid guidelines and our immunocompromised population.  ? ? ?Daratumumab; Hyaluronidase Injection ?What is this medication? ?DARATUMUMAB; HYALURONIDASE (dar a toom ue mab / hye al ur ON i dase) is a monoclonal antibody. Hyaluronidase is used to improve the effects of daratumumab. It treats certain types of cancer. Some of the cancers treated are multiple myeloma and light-chain amyloidosis. ?This medicine may  be used for other purposes; ask your health care provider or pharmacist if you have questions. ?COMMON BRAND NAME(S): DARZALEX FASPRO ?What should I tell my care team before I take this medication? ?They need to know if you have any of these conditions: ?heart disease ?infection  especially a viral infection such as chickenpox, cold sores, herpes, or hepatitis B ?lung or breathing disease ?an unusual or allergic reaction to daratumumab, hyaluronidase, other medicines, foods, dyes, or preservatives ?pregnant or trying to get pregnant ?breast-feeding ?How should I use this medication? ?This medicine is for injection under the skin. It is given by a health care professional in a hospital or clinic setting. ?Talk to your pediatrician regarding the use of this medicine in children. Special care may be needed. ?Overdosage: If you think you have taken too much of this medicine contact a poison control center or emergency room at once. ?NOTE: This medicine is only for you. Do not share this medicine with others. ?What if I miss a dose? ?Keep appointments for follow-up doses as directed. It is important not to miss your dose. Call your doctor or health care professional if you are unable to keep an appointment. ?What may interact with this medication? ?Interactions have not been studied. ?This list may not describe all possible interactions. Give your health care provider a list of all the medicines, herbs, non-prescription drugs, or dietary supplements you use. Also tell them if you smoke, drink alcohol, or use illegal drugs. Some items may interact with your medicine. ?What should I watch for while using this medication? ?Your condition will be monitored carefully while you are receiving this medicine. ?This medicine can cause serious allergic reactions. To reduce your risk, your health care provider may give you other medicine to take before receiving this one. Be sure to follow the directions from your health care provider. ?This medicine can affect the results of blood tests to match your blood type. These changes can last for up to 6 months after the final dose. Your healthcare provider will do blood tests to match your blood type before you start treatment. Tell all of your healthcare  providers that you are being treated with this medicine before receiving a blood transfusion. ?This medicine can affect the results of some tests used to determine treatment response; extra tests may be needed to evaluate response. ?Do not become pregnant while taking this medicine or for 3 months after stopping it. Women should inform their health care provider if they wish to become pregnant or think they might be pregnant. There is a potential for serious side effects to an unborn child. Talk to your health care provider for more information. Do not breast-feed an infant while taking this medicine. ?What side effects may I notice from receiving this medication? ?Side effects that you should report to your care team as soon as possible: ?Allergic reactions--skin rash, itching or hives, swelling of the face, lips, or tongue ?Blood clot--chest pain, shortness of breath, pain, swelling or warmth in the leg ?Blurred vision ?Fast, irregular heartbeat ?Infection--fever, chills, cough, sore throat, pain or trouble passing urine ?Injection reactions--dizziness, fast heartbeat, feeling faint or lightheaded, falls, headache, increase in blood pressure, nausea, vomiting, or wheezing or trouble breathing with loud or whistling sounds ?Low red blood cell counts--trouble breathing, feeling faint, lightheaded or falls, unusually weak or tired ?Unusual bleeding or bruising ?Side effects that usually do not require medical attention (report these to your care team if they continue or are bothersome): ?Back pain ?  Constipation ?Diarrhea ?Pain, tingling, numbness in the hands or feet ?Pain, redness, or irritation at site where injected ?Muscle cramp or pain ?Swelling of the ankles, feet, hands ?Tiredness ?Trouble sleeping ?This list may not describe all possible side effects. Call your doctor for medical advice about side effects. You may report side effects to FDA at 1-800-FDA-1088. ?Where should I keep my medication? ?This drug is  given in a hospital or clinic and will not be stored at home. ?NOTE: This sheet is a summary. It may not cover all possible information. If you have questions about this medicine, talk to your doctor, pharm

## 2022-03-24 NOTE — Progress Notes (Signed)
?Hematology/Oncology Progress note ?[phone] [phone] ?  ? ? ? ?Patient Care Team: ?Johnston, John D, MD as PCP - General (Internal Medicine) ?Chrystal, Glenn, MD as Radiation Oncologist (Radiation Oncology) ?Johnston, John D, MD (Internal Medicine) ?Kolluru, Sarath, MD as Consulting Physician (Nephrology) ?Olliver Boyadjian, MD as Consulting Physician (Oncology) ? ?REFERRING PROVIDER: ?Johnston, John D, MD ? ?CHIEF COMPLAINTS/REASON FOR VISIT:  ?Multiple myeloma, high-grade urothelial carcinoma. ? ?HISTORY OF PRESENTING ILLNESS:  ?[name] [name] is a 81 y.o. male who was seen in consultation at the request of Johnston, John D, MD presents for follow-up of multiple myeloma and high-grade urothelial carcinoma management.   ? ?Patient has progressively worsening kidney function.  He is scheduled for kidney biopsy. ?01/26/2022, free kappa light chain 1058, lambda 15.3, light chain ratio 69. ?Protein electrophoresis showed restricted band-M spike migrating in the beta-1 globulin region. ?ANA negative.  ANCA negative. ?Random urine protein electrophoresis showed abnormal protein band detected in the gamma globulin And a second possible abnormal protein band that may represent monoclonal protein. ? ?Patient denies any back pain, unintentional weight loss, night sweats or fever. ?He lives at home with wife. ? 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 ?02/23/2022 multiple myeloma showed IgA 2883, M protein of 1.8, beta 2 microglobulin 6.9, kappa free light chain 1268.9, lambda 13.2, free light chain ratio 96.13.   ?CMP showed creatinine of 3.41, that is significantly worse than his baseline in June 2022. ?02/28/2022, 24-hour urine protein electrophoresis showed M protein of 729 mg, IgA monoclonal kappa light chain. ?03/01/2022, [facility] kidney biopsy showed diffuse light chain tubulopathy, and light chain cast nephropathy, moderate interstitial fibrosis,4%  global glomerular sclerosis and the marked atherosclerosis. ? ?03/02/2022 PET scan showed no definitive signs of multiple myeloma by FDG PET. ?Obstructive right ureter with soft tissue mass in the right pelvis showing increased metabolic activity suspicious for urothelial neoplasm.  Little or no excretion of FDG on the RIGHT but still with uptake of FDG by renal parenchyma. Degree of hydronephrosis is not substantially changed but lack of FDG excretion on the RIGHT is compatible with secondary sign of physiologic significance of ?ureteral obstruction. RIGHT pelvic sidewall uptake without visible lesion, could represent tiny lymph node not visible on noncontrast imaging. If the patient is no longer and acute renal failure could consider MRI with and without contrast for further assessment of pelvic findings. Ultimately cystoscopy may be helpful for further assessment, aortic atherosclerosis, pulmonary emphysema. ? ? ?03/06/2022, patient underwent a bone marrow biopsy. ?Pathology showed hypercellular bone marrow with plasma cell neoplasm, representing 20% of all cells in the aspirate associated with prominent interstitial infiltrates and numerous predominantly small clusters in the clots in the biopsy sections.  The plasma cells display kappa light chain restriction consistent with plasma cell neoplasm. ?Normal cytogenetics, myeloma FISH panel positive for 13q deletion, gain of 1q, t (4;14) ? ?Patient received 20 mg dexamethasone x1 after bone marrow biopsy. ? ?03/14/2022, cystoscopy showed diffuse narrowing right distal ureter with hydroureter following up the distal ureter and extending proximally.  Right ureteroscopy showed nodular tumor distal ureter and a convincing area without tumor suspicious for extrinsic obstruction.s/p   right ureteral stent placement. Ureteral tumor showed a tiny fragments of fibrous tissue, interpretation limited by thermal and crush artifact.  Right distal ureter saline barbotage showed  hight grade urothelial carcinoma.  ? ?INTERVAL HISTORY ?[name] [name] is a 81 y.o. male who has above history reviewed by me   today presents for follow up visit for multiple myeloma and high-grade urothelial carcinoma. ? ?Patient was accompanied by her wife.  he was seen by Dr. Bernardo Heater earlier this week.  Urology recommendsm distal ureterectomy.  Patient is in the process of seeing [facility] urology oncology for evaluation.  Patient took dexamethasone 20 mg on 03/20/2022 ? ?Patient has been to chemotherapy class.  He has questions of his treatment plan.  He has no new complaints. ? ? ?Review of Systems  ?Constitutional:  Positive for fatigue. Negative for appetite change, chills, fever and unexpected weight change.  ?HENT:   Negative for hearing loss and voice change.   ?Eyes:  Negative for eye problems and icterus.  ?Respiratory:  Negative for chest tightness, cough and shortness of breath.   ?Cardiovascular:  Negative for chest pain and leg swelling.  ?Gastrointestinal:  Negative for abdominal distention and abdominal pain.  ?Endocrine: Negative for hot flashes.  ?Genitourinary:  Negative for difficulty urinating, dysuria and frequency.   ?Musculoskeletal:  Negative for arthralgias.  ?Skin:  Negative for itching and rash.  ?Neurological:  Negative for light-headedness and numbness.  ?Hematological:  Negative for adenopathy. Does not bruise/bleed easily.  ?Psychiatric/Behavioral:  Negative for confusion.   ? ? ?MEDICAL HISTORY:  ?Past Medical History:  ?Diagnosis Date  ? Age related osteoporosis   ? Anemia   ? Barrett's esophagus   ? Bradycardia   ? Cancer [facility])   ? PROSTATE  ? Cataract   ? CKD (chronic kidney disease) stage 3, GFR 30-59 ml/min (HCC)   ? Colon polyps   ? DDD (degenerative disc disease), cervical   ? DDD (degenerative disc disease), lumbar   ? Femur fracture ([place])   ? Right  ? Fundic gland polyposis of stomach   ? Fundic gland polyps of stomach, benign   ? Gastritis   ? GERD (gastroesophageal reflux disease)    ? Hematuria   ? Hemorrhage of rectum and anus   ? History of colon polyps   ? Insomnia   ? Lumbago   ? Osteopenia of the elderly   ? Skin cancer   ? Sleep apnea   ? ? ?SURGICAL HISTORY: ?Past Surgical History:  ?Procedure Laterality Date  ? BAND HEMORRHOIDECTOMY    ? COLONOSCOPY    ? COLONOSCOPY WITH PROPOFOL N/A 05/06/2018  ? Procedure: COLONOSCOPY WITH PROPOFOL;  Surgeon: Lollie Sails, MD;  Location: [facility] ENDOSCOPY;  Service: Endoscopy;  Laterality: N/A;  ? COLONOSCOPY WITH PROPOFOL N/A 09/23/2018  ? Procedure: COLONOSCOPY WITH PROPOFOL;  Surgeon: Lollie Sails, MD;  [facility] END[facility]COPY;  Service: Endoscopy;  Laterality: N/A;  ? COLONOSCOPY WITH PROPOFOL N/A 03/30/2021  ? Procedure: COLONOSCOPY WITH PROPOFOL;  Surgeon: Toledo, Benay Pike, MD;  Location: [facility] ENDOSCOPY;  Service: Gastroenterology;  Laterality: N/A;  ? CYSTOSCOPY W/ URETERAL STENT PLACEMENT Right 03/14/2022  ? Procedure: CYSTOSCOPY WITH RETROGRADE PYELOGRAM/URETERAL STENT PLACEMENT;  Surgeon: Abbie Sons, MD;  Location: [facility] ORS;  Service: Urology;  Laterality: Right;  ? ESOPHAGOGASTRODUODENOSCOPY (EGD) WITH PROPOFOL N/A 10/03/2016  ? Procedure: ESOPHAGOGASTRODUODENOSCOPY (EGD) WITH PROPOFOL;  Surgeon: Lollie Sails, MD;  Location: [facility] ENDOSCOPY;  Service: Endoscopy;  Laterality: N/A;  ? EYE SURGERY    ? CATARACTS  ? EYELID SURGERY    ? FLEXIBLE SIGMOIDOSCOPY    ? FRACTURE SURGERY Right 2016  ? femur  ? FRACTURE SURGERY    ? ORIF DISTAL FEMUR FRACTURE    ? TONSILLECTOMY    ? TRANSURETHRAL RESECTION OF BLADDER TUMOR N/A 03/14/2022  ?  Procedure: TRANSURETHRAL RESECTION OF BLADDER TUMOR (TURBT);  Surgeon: Abbie Sons, MD;  Location: [facility] ORS;  Service: Urology;  Laterality: N/A;  ? ? ?SOCIAL HISTORY: ?Social History  ? ?Socioeconomic History  ? Marital status: Married  ?  Spouse name: Not on file  ? Number of children: Not on file  ? Years of education: Not on file  ? Highest education level: Not on file  ?Occupational  History  ? Not on file  ?Tobacco Use  ? Smoking status: Former  ?  Packs/day: 1.00  ?  Years: 15.00  ?  Pack years: 15.00  ?  Types: Cigarettes  ?  Quit date: 10/03/1974  ?  Years since quitting: 47.5  ? Smok

## 2022-03-24 NOTE — Progress Notes (Signed)
Pt has questions about medications. Pt c/o mild nausea, quickly relived without medication, difficulty sleeping d/t anxiety ?

## 2022-03-27 ENCOUNTER — Telehealth: Payer: Self-pay

## 2022-03-27 LAB — MULTIPLE MYELOMA PANEL, SERUM
Albumin SerPl Elph-Mcnc: 3.5 g/dL (ref 2.9–4.4)
Albumin/Glob SerPl: 1 (ref 0.7–1.7)
Alpha 1: 0.2 g/dL (ref 0.0–0.4)
Alpha2 Glob SerPl Elph-Mcnc: 0.6 g/dL (ref 0.4–1.0)
B-Globulin SerPl Elph-Mcnc: 2.5 g/dL — ABNORMAL HIGH (ref 0.7–1.3)
Gamma Glob SerPl Elph-Mcnc: 0.4 g/dL (ref 0.4–1.8)
Globulin, Total: 3.6 g/dL (ref 2.2–3.9)
IgA: 2888 mg/dL — ABNORMAL HIGH (ref 61–437)
IgG (Immunoglobin G), Serum: 345 mg/dL — ABNORMAL LOW (ref 603–1613)
IgM (Immunoglobulin M), Srm: 26 mg/dL (ref 15–143)
M Protein SerPl Elph-Mcnc: 1.5 g/dL — ABNORMAL HIGH
Total Protein ELP: 7.1 g/dL (ref 6.0–8.5)

## 2022-03-27 LAB — KAPPA/LAMBDA LIGHT CHAINS
Kappa free light chain: 880.2 mg/L — ABNORMAL HIGH (ref 3.3–19.4)
Kappa, lambda light chain ratio: 72.74 — ABNORMAL HIGH (ref 0.26–1.65)
Lambda free light chains: 12.1 mg/L (ref 5.7–26.3)

## 2022-03-27 NOTE — Telephone Encounter (Signed)
Telephone call to patient for follow up after receiving first infusion.   Patient states infusion went great.  States eating good and drinking plenty of fluids.   Denies any nausea or vomiting.  Encouraged patient to call for any concerns or questions. 

## 2022-03-28 LAB — PRETREATMENT RBC PHENOTYPE
DAT, IgG: NEGATIVE
DAT, complement: NEGATIVE

## 2022-03-29 ENCOUNTER — Ambulatory Visit: Payer: Self-pay | Admitting: Urology

## 2022-03-30 ENCOUNTER — Encounter: Payer: Self-pay | Admitting: Urology

## 2022-03-31 ENCOUNTER — Ambulatory Visit: Payer: Medicare Other | Admitting: Oncology

## 2022-03-31 ENCOUNTER — Other Ambulatory Visit: Payer: Medicare Other

## 2022-03-31 ENCOUNTER — Telehealth: Payer: Self-pay | Admitting: *Deleted

## 2022-03-31 ENCOUNTER — Ambulatory Visit: Payer: Medicare Other

## 2022-03-31 NOTE — Telephone Encounter (Signed)
Patient called asking if he is to follow the same protocol regarding the Montelukast as with the first infusion ?

## 2022-03-31 NOTE — Telephone Encounter (Signed)
CAll returned to patient and advised that he is to take medicine every time he gets [place]. He repeated back to me and thanked me for letting him know ?

## 2022-03-31 NOTE — Telephone Encounter (Signed)
Per rx Instructions: Take 1 tablet the day prior to Daratumumab treatments, and take 1 tablet daily for 2 days after treatments. Please follow same directions prior to receiving Dara.  ?

## 2022-04-03 ENCOUNTER — Telehealth: Payer: Self-pay | Admitting: Pharmacist

## 2022-04-03 ENCOUNTER — Encounter: Payer: Self-pay | Admitting: Oncology

## 2022-04-03 ENCOUNTER — Inpatient Hospital Stay (HOSPITAL_BASED_OUTPATIENT_CLINIC_OR_DEPARTMENT_OTHER): Payer: Medicare Other | Admitting: Oncology

## 2022-04-03 ENCOUNTER — Inpatient Hospital Stay: Payer: Medicare Other | Attending: Oncology

## 2022-04-03 ENCOUNTER — Inpatient Hospital Stay: Payer: Medicare Other

## 2022-04-03 VITALS — BP 144/57 | HR 57 | Resp 18

## 2022-04-03 VITALS — BP 129/50 | HR 55 | Temp 97.1°F | Resp 18 | Wt 147.8 lb

## 2022-04-03 DIAGNOSIS — I129 Hypertensive chronic kidney disease with stage 1 through stage 4 chronic kidney disease, or unspecified chronic kidney disease: Secondary | ICD-10-CM | POA: Insufficient documentation

## 2022-04-03 DIAGNOSIS — K2270 Barrett's esophagus without dysplasia: Secondary | ICD-10-CM | POA: Diagnosis not present

## 2022-04-03 DIAGNOSIS — C669 Malignant neoplasm of unspecified ureter: Secondary | ICD-10-CM | POA: Diagnosis not present

## 2022-04-03 DIAGNOSIS — D631 Anemia in chronic kidney disease: Secondary | ICD-10-CM | POA: Diagnosis not present

## 2022-04-03 DIAGNOSIS — N184 Chronic kidney disease, stage 4 (severe): Secondary | ICD-10-CM

## 2022-04-03 DIAGNOSIS — C9000 Multiple myeloma not having achieved remission: Secondary | ICD-10-CM

## 2022-04-03 DIAGNOSIS — Z85828 Personal history of other malignant neoplasm of skin: Secondary | ICD-10-CM | POA: Diagnosis not present

## 2022-04-03 DIAGNOSIS — Z79899 Other long term (current) drug therapy: Secondary | ICD-10-CM | POA: Diagnosis not present

## 2022-04-03 DIAGNOSIS — Z8546 Personal history of malignant neoplasm of prostate: Secondary | ICD-10-CM | POA: Insufficient documentation

## 2022-04-03 DIAGNOSIS — M5136 Other intervertebral disc degeneration, lumbar region: Secondary | ICD-10-CM | POA: Diagnosis not present

## 2022-04-03 DIAGNOSIS — M5030 Other cervical disc degeneration, unspecified cervical region: Secondary | ICD-10-CM | POA: Diagnosis not present

## 2022-04-03 DIAGNOSIS — K219 Gastro-esophageal reflux disease without esophagitis: Secondary | ICD-10-CM | POA: Diagnosis not present

## 2022-04-03 DIAGNOSIS — Z7982 Long term (current) use of aspirin: Secondary | ICD-10-CM | POA: Diagnosis not present

## 2022-04-03 DIAGNOSIS — F419 Anxiety disorder, unspecified: Secondary | ICD-10-CM | POA: Insufficient documentation

## 2022-04-03 DIAGNOSIS — Z5112 Encounter for antineoplastic immunotherapy: Secondary | ICD-10-CM | POA: Insufficient documentation

## 2022-04-03 DIAGNOSIS — Z86010 Personal history of colonic polyps: Secondary | ICD-10-CM | POA: Diagnosis not present

## 2022-04-03 DIAGNOSIS — Z87891 Personal history of nicotine dependence: Secondary | ICD-10-CM | POA: Diagnosis not present

## 2022-04-03 DIAGNOSIS — R21 Rash and other nonspecific skin eruption: Secondary | ICD-10-CM | POA: Insufficient documentation

## 2022-04-03 DIAGNOSIS — G4730 Sleep apnea, unspecified: Secondary | ICD-10-CM | POA: Insufficient documentation

## 2022-04-03 DIAGNOSIS — Z7961 Long term (current) use of immunomodulator: Secondary | ICD-10-CM | POA: Insufficient documentation

## 2022-04-03 DIAGNOSIS — M810 Age-related osteoporosis without current pathological fracture: Secondary | ICD-10-CM | POA: Diagnosis not present

## 2022-04-03 LAB — CBC WITH DIFFERENTIAL/PLATELET
Abs Immature Granulocytes: 0.01 10*3/uL (ref 0.00–0.07)
Basophils Absolute: 0 10*3/uL (ref 0.0–0.1)
Basophils Relative: 0 %
Eosinophils Absolute: 0.3 10*3/uL (ref 0.0–0.5)
Eosinophils Relative: 5 %
HCT: 33.2 % — ABNORMAL LOW (ref 39.0–52.0)
Hemoglobin: 10.8 g/dL — ABNORMAL LOW (ref 13.0–17.0)
Immature Granulocytes: 0 %
Lymphocytes Relative: 8 %
Lymphs Abs: 0.4 10*3/uL — ABNORMAL LOW (ref 0.7–4.0)
MCH: 30.1 pg (ref 26.0–34.0)
MCHC: 32.5 g/dL (ref 30.0–36.0)
MCV: 92.5 fL (ref 80.0–100.0)
Monocytes Absolute: 0.5 10*3/uL (ref 0.1–1.0)
Monocytes Relative: 9 %
Neutro Abs: 4.3 10*3/uL (ref 1.7–7.7)
Neutrophils Relative %: 78 %
Platelets: 165 10*3/uL (ref 150–400)
RBC: 3.59 MIL/uL — ABNORMAL LOW (ref 4.22–5.81)
RDW: 14.3 % (ref 11.5–15.5)
WBC: 5.5 10*3/uL (ref 4.0–10.5)
nRBC: 0 % (ref 0.0–0.2)

## 2022-04-03 LAB — COMPREHENSIVE METABOLIC PANEL
ALT: 13 U/L (ref 0–44)
AST: 20 U/L (ref 15–41)
Albumin: 3 g/dL — ABNORMAL LOW (ref 3.5–5.0)
Alkaline Phosphatase: 75 U/L (ref 38–126)
Anion gap: 6 (ref 5–15)
BUN: 45 mg/dL — ABNORMAL HIGH (ref 8–23)
CO2: 23 mmol/L (ref 22–32)
Calcium: 8.5 mg/dL — ABNORMAL LOW (ref 8.9–10.3)
Chloride: 109 mmol/L (ref 98–111)
Creatinine, Ser: 3.23 mg/dL — ABNORMAL HIGH (ref 0.61–1.24)
GFR, Estimated: 18 mL/min — ABNORMAL LOW (ref >60)
Glucose, Bld: 107 mg/dL — ABNORMAL HIGH (ref 70–99)
Potassium: 4.2 mmol/L (ref 3.5–5.1)
Sodium: 138 mmol/L (ref 135–145)
Total Bilirubin: 0.5 mg/dL (ref 0.3–1.2)
Total Protein: 7.1 g/dL (ref 6.5–8.1)

## 2022-04-03 MED ORDER — DARATUMUMAB-HYALURONIDASE-FIHJ 1800-30000 MG-UT/15ML ~~LOC~~ SOLN
1800.0000 mg | Freq: Once | SUBCUTANEOUS | Status: AC
  Administered 2022-04-03: 1800 mg via SUBCUTANEOUS
  Filled 2022-04-03: qty 15

## 2022-04-03 MED ORDER — SODIUM CHLORIDE 0.9 % IV SOLN
20.0000 mg | Freq: Once | INTRAVENOUS | Status: DC
  Filled 2022-04-03: qty 2

## 2022-04-03 MED ORDER — ACETAMINOPHEN 325 MG PO TABS
650.0000 mg | ORAL_TABLET | Freq: Once | ORAL | Status: AC
  Administered 2022-04-03: 650 mg via ORAL
  Filled 2022-04-03: qty 2

## 2022-04-03 MED ORDER — BORTEZOMIB CHEMO SQ INJECTION 3.5 MG (2.5MG/ML)
1.3000 mg/m2 | Freq: Once | INTRAMUSCULAR | Status: AC
  Administered 2022-04-03: 2.25 mg via SUBCUTANEOUS
  Filled 2022-04-03: qty 0.9

## 2022-04-03 MED ORDER — BORTEZOMIB CHEMO IV INJECTION 3.5 MG(1MG/ML): 1.3000 mg/m2 | Freq: Once | INTRAMUSCULAR | Status: DC

## 2022-04-03 MED ORDER — DEXAMETHASONE 4 MG PO TABS
20.0000 mg | ORAL_TABLET | Freq: Once | ORAL | Status: AC
  Administered 2022-04-03: 20 mg via ORAL
  Filled 2022-04-03: qty 5

## 2022-04-03 MED ORDER — MONTELUKAST SODIUM 10 MG PO TABS
10.0000 mg | ORAL_TABLET | Freq: Once | ORAL | Status: AC
  Administered 2022-04-03: 10 mg via ORAL
  Filled 2022-04-03: qty 1

## 2022-04-03 MED ORDER — DEXAMETHASONE 4 MG PO TABS: 20.0000 mg | ORAL_TABLET | Freq: Once | ORAL | Status: DC

## 2022-04-03 MED ORDER — LENALIDOMIDE 5 MG PO CAPS: 5.0000 mg | ORAL_CAPSULE | Freq: Every day | ORAL | 0 refills | Status: DC

## 2022-04-03 MED ORDER — DIPHENHYDRAMINE HCL 25 MG PO CAPS
50.0000 mg | ORAL_CAPSULE | Freq: Once | ORAL | Status: AC
  Administered 2022-04-03: 50 mg via ORAL
  Filled 2022-04-03: qty 2

## 2022-04-03 NOTE — Telephone Encounter (Addendum)
Oral Oncology Pharmacist Encounter ? ?Received new prescription for Revlimid (lenalidomide) for the treatment of IgA kappa multiple myeloma in conjunction with daratumumab, bortezomib, and dexamethasone, planned duration until disease control or unacceptable drug toxicity. ? ?CMP from 04/03/22 assessed, SCr elevated at 3.23 mg/dL, CrCl ~17 mL/min. Patient's lenalidomide has been dose reduced tofor renal impairment, Continue to monitor renal function. Prescription dose and frequency assessed, (planned dose 18m 21on/7off) ? ?Current medication list in Epic reviewed, no DDIs with lenalidomide identified, ? ?Evaluated chart and no patient barriers to medication adherence identified.  ? ?Prescription has been e-scribed to [facility] ? ?Oral Oncology Clinic will continue to follow for insurance authorization, copayment issues, initial counseling and start date. ? ? ?ADarl Pikes PharmD, BCPS, BCOP, CPP ?Hematology/Oncology Clinical Pharmacist Practitioner ?[facility] Oral Chemotherapy Navigation Clinic ?[phone]? ?04/03/2022 12:37 PM ? ?

## 2022-04-03 NOTE — Progress Notes (Signed)
Pt here for follow up. No new concerns voiced.   

## 2022-04-03 NOTE — Patient Instructions (Signed)
[facility]CER CTR AT [place]  Discharge Instructions: ?Thank you for choosing [facility] to provide your oncology and hematology care.  ?If you have a lab appointment with the [facility], please go directly to the [facility] and check in at the registration area. ? ?Wear comfortable clothing and clothing appropriate for easy access to any Portacath or PICC line.  ? ?We strive to give you quality time with your provider. You may need to reschedule your appointment if you arrive late (15 or more minutes).  Arriving late affects you and other patients whose appointments are after yours.  Also, if you miss three or more appointments without notifying the office, you may be dismissed from the clinic at the provider?s discretion.    ?  ?For prescription refill requests, have your pharmacy contact our office and allow 72 hours for refills to be completed.   ? ?Today you received the following chemotherapy and/or immunotherapy agents Daratumumab and Velcade    ?  ?To help prevent nausea and vomiting after your treatment, we encourage you to take your nausea medication as directed. ? ?BELOW ARE SYMPTOMS THAT SHOULD BE REPORTED IMMEDIATELY: ?*FEVER GREATER THAN 100.4 F (38 ?C) OR HIGHER ?*CHILLS OR SWEATING ?*NAUSEA AND VOMITING THAT IS NOT CONTROLLED WITH YOUR NAUSEA MEDICATION ?*UNUSUAL SHORTNESS OF BREATH ?*UNUSUAL BRUISING OR BLEEDING ?*URINARY PROBLEMS (pain or burning when urinating, or frequent urination) ?*BOWEL PROBLEMS (unusual diarrhea, constipation, pain near the anus) ?TENDERNESS IN MOUTH AND THROAT WITH OR WITHOUT PRESENCE OF ULCERS (sore throat, sores in mouth, or a toothache) ?UNUSUAL RASH, SWELLING OR PAIN  ?UNUSUAL VAGINAL DISCHARGE OR ITCHING  ? ?Items with * indicate a potential emergency and should be followed up as soon as possible or go to the Emergency Department if any problems should occur. ? ?Please show the CHEMOTHERAPY ALERT CARD or IMMUNOTHERAPY ALERT CARD at  check-in to the Emergency Department and triage nurse. ? ?Should you have questions after your visit or need to cancel or reschedule your appointment, please contact [facility] CANCER [place] AT [place]  [phone] and follow the prompts.  Office hours are 8:00 a.m. to 4:30 p.m. Monday - Friday. Please note that voicemails left after 4:00 p.m. may not be returned until the following business day.  We are closed weekends and major holidays. You have access to a nurse at all times for urgent questions. Please call the main number to the clinic [phone] and follow the prompts. ? ?For any non-urgent questions, you may also contact your provider using MyChart. We now offer e-Visits for anyone 76 and older to request care online for non-urgent symptoms. For details visit [email or web address]. ?  ?Also download the MyChart app! Go to the app store, search "MyChart", open the app, select [facility], and log in with your MyChart username and password. ? ?Due to Covid, a mask is required upon entering the hospital/clinic. If you do not have a mask, one will be given to you upon arrival. For doctor visits, patients may have 1 support person aged 43 or older with them. For treatment visits, patients cannot have anyone with them due to current Covid guidelines and our immunocompromised population.  ? ? ?Bortezomib injection ?What is this medication? ?BORTEZOMIB (bor TEZ oh mib) targets proteins in cancer cells and stops the cancer cells from growing. It treats multiple myeloma and mantle cell lymphoma. ?This medicine may be used for other purposes; ask your health care provider or pharmacist if you have questions. ?COMMON BRAND  NAME(S): Velcade ?What should I tell my care team before I take this medication? ?They need to know if you have any of these conditions: ?dehydration ?diabetes (high blood sugar) ?heart disease ?liver disease ?tingling of the fingers or toes or other nerve disorder ?an unusual or  allergic reaction to bortezomib, mannitol, boron, other medicines, foods, dyes, or preservatives ?pregnant or trying to get pregnant ?breast-feeding ?How should I use this medication? ?This medicine is injected into a vein or under the skin. It is given by a health care provider in a hospital or clinic setting. ?Talk to your health care provider about the use of this medicine in children. Special care may be needed. ?Overdosage: If you think you have taken too much of this medicine contact a poison control center or emergency room at once. ?NOTE: This medicine is only for you. Do not share this medicine with others. ?What if I miss a dose? ?Keep appointments for follow-up doses. It is important not to miss your dose. Call your health care provider if you are unable to keep an appointment. ?What may interact with this medication? ?This medicine may interact with the following medications: ?ketoconazole ?rifampin ?This list may not describe all possible interactions. Give your health care provider a list of all the medicines, herbs, non-prescription drugs, or dietary supplements you use. Also tell them if you smoke, drink alcohol, or use illegal drugs. Some items may interact with your medicine. ?What should I watch for while using this medication? ?Your condition will be monitored carefully while you are receiving this medicine. ?You may need blood work done while you are taking this medicine. ?You may get drowsy or dizzy. Do not drive, use machinery, or do anything that needs mental alertness until you know how this medicine affects you. Do not stand up or sit up quickly, especially if you are an older patient. This reduces the risk of dizzy or fainting spells ?This medicine may increase your risk of getting an infection. Call your health care provider for advice if you get a fever, chills, sore throat, or other symptoms of a cold or flu. Do not treat yourself. Try to avoid being around people who are sick. ?Check  with your health care provider if you have severe diarrhea, nausea, and vomiting, or if you sweat a lot. The loss of too much body fluid may make it dangerous for you to take this medicine. ?Do not become pregnant while taking this medicine or for 7 months after stopping it. Women should inform their health care provider if they wish to become pregnant or think they might be pregnant. Men should not father a child while taking this medicine and for 4 months after stopping it. There is a potential for serious harm to an unborn child. Talk to your health care provider for more information. Do not breast-feed an infant while taking this medicine or for 2 months after stopping it. ?This medicine may make it more difficult to get pregnant or father a child. Talk to your health care provider if you are concerned about your fertility. ?What side effects may I notice from receiving this medication? ?Side effects that you should report to your doctor or health care professional as soon as possible: ?allergic reactions (skin rash; itching or hives; swelling of the face, lips, or tongue) ?bleeding (bloody or black, tarry stools; red or dark brown urine; spitting up blood or brown material that looks like coffee grounds; red spots on the skin; unusual bruising or bleeding  from the eye, gums, or nose) ?blurred vision or changes in vision ?confusion ?constipation ?headache ?heart failure (trouble breathing; fast, irregular heartbeat; sudden weight gain; swelling of the ankles, feet, hands) ?infection (fever, chills, cough, sore throat, pain or trouble passing urine) ?lack or loss of appetite ?liver injury (dark yellow or brown urine; general ill feeling or flu-like symptoms; loss of appetite, right upper belly pain; yellowing of the eyes or skin) ?low blood pressure (dizziness; feeling faint or lightheaded, falls; unusually weak or tired) ?muscle cramps ?pain, redness, or irritation at site where injected ?pain, tingling,  numbness in the hands or feet ?seizures ?trouble breathing ?unusual bruising or bleeding ?Side effects that usually do not require medical attention (report to your doctor or health care professional if they contin

## 2022-04-03 NOTE — Progress Notes (Signed)
?Hematology/Oncology Progress note ?[phone] [phone] ?  ? ? ? ?Patient Care Team: ?Johnston, John D, MD as PCP - General (Internal Medicine) ?Chrystal, Glenn, MD as Radiation Oncologist (Radiation Oncology) ?Johnston, John D, MD (Internal Medicine) ?Kolluru, Sarath, MD as Consulting Physician (Nephrology) ?Yu, Zhou, MD as Consulting Physician (Oncology) ? ?REFERRING PROVIDER: ?Johnston, John D, MD ? ?CHIEF COMPLAINTS/REASON FOR VISIT:  ?Multiple myeloma, high-grade urothelial carcinoma. ? ?HISTORY OF PRESENTING ILLNESS:  ?[name] [name] is a 81 y.o. male who was seen in consultation at the request of Johnston, John D, MD presents for follow-up of multiple myeloma and high-grade urothelial carcinoma management.   ? ?Patient has progressively worsening kidney function.  He is scheduled for kidney biopsy. ?01/26/2022, free kappa light chain 1058, lambda 15.3, light chain ratio 69. ?Protein electrophoresis showed restricted band-M spike migrating in the beta-1 globulin region. ?ANA negative.  ANCA negative. ?Random urine protein electrophoresis showed abnormal protein band detected in the gamma globulin And a second possible abnormal protein band that may represent monoclonal protein. ? ?Patient denies any back pain, unintentional weight loss, night sweats or fever. ?He lives at home with wife. ? 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 ?02/23/2022 multiple myeloma showed IgA 2883, M protein of 1.8, beta 2 microglobulin 6.9, kappa free light chain 1268.9, lambda 13.2, free light chain ratio 96.13.   ?CMP showed creatinine of 3.41, that is significantly worse than his baseline in June 2022. ?02/28/2022, 24-hour urine protein electrophoresis showed M protein of 729 mg, IgA monoclonal kappa light chain. ?03/01/2022, [facility] kidney biopsy showed diffuse light chain tubulopathy, and light chain cast nephropathy, moderate interstitial fibrosis,4%  global glomerular sclerosis and the marked atherosclerosis. ? ?03/02/2022 PET scan showed no definitive signs of multiple myeloma by FDG PET. ?Obstructive right ureter with soft tissue mass in the right pelvis showing increased metabolic activity suspicious for urothelial neoplasm.  Little or no excretion of FDG on the RIGHT but still with uptake of FDG by renal parenchyma. Degree of hydronephrosis is not substantially changed but lack of FDG excretion on the RIGHT is compatible with secondary sign of physiologic significance of ?ureteral obstruction. RIGHT pelvic sidewall uptake without visible lesion, could represent tiny lymph node not visible on noncontrast imaging. If the patient is no longer and acute renal failure could consider MRI with and without contrast for further assessment of pelvic findings. Ultimately cystoscopy may be helpful for further assessment, aortic atherosclerosis, pulmonary emphysema. ? ? ?03/06/2022, patient underwent a bone marrow biopsy. ?Pathology showed hypercellular bone marrow with plasma cell neoplasm, representing 20% of all cells in the aspirate associated with prominent interstitial infiltrates and numerous predominantly small clusters in the clots in the biopsy sections.  The plasma cells display kappa light chain restriction consistent with plasma cell neoplasm. ?Normal cytogenetics, myeloma FISH panel positive for 13q deletion, gain of 1q, t (4;14) ? ?Patient received 20 mg dexamethasone x1 after bone marrow biopsy. ? ?03/14/2022, cystoscopy showed diffuse narrowing right distal ureter with hydroureter following up the distal ureter and extending proximally.  Right ureteroscopy showed nodular tumor distal ureter and a convincing area without tumor suspicious for extrinsic obstruction.s/p   right ureteral stent placement. Ureteral tumor showed a tiny fragments of fibrous tissue, interpretation limited by thermal and crush artifact.  Right distal ureter saline barbotage showed  hight grade urothelial carcinoma.  ? ?INTERVAL HISTORY ?[name] [name] is a 81 y.o. male who has above history reviewed by me   today presents for follow up visit for multiple myeloma and high-grade urothelial carcinoma. ? ?Today patient is here by himself.  Status post 1 dose of Daratumumab with dexamethasone.  He tolerates well.  He denies any new complaints today.  He has not had appointment scheduled with [facility] urology yet. ?Denies any skin rash, shortness of breath, cough, chest tightness. ? ? ?Review of Systems  ?Constitutional:  Positive for fatigue. Negative for appetite change, chills, fever and unexpected weight change.  ?HENT:   Negative for hearing loss and voice change.   ?Eyes:  Negative for eye problems and icterus.  ?Respiratory:  Negative for chest tightness, cough and shortness of breath.   ?Cardiovascular:  Negative for chest pain and leg swelling.  ?Gastrointestinal:  Negative for abdominal distention and abdominal pain.  ?Endocrine: Negative for hot flashes.  ?Genitourinary:  Negative for difficulty urinating, dysuria and frequency.   ?Musculoskeletal:  Negative for arthralgias.  ?Skin:  Negative for itching and rash.  ?Neurological:  Negative for light-headedness and numbness.  ?Hematological:  Negative for adenopathy. Does not bruise/bleed easily.  ?Psychiatric/Behavioral:  Negative for confusion.   ? ? ?MEDICAL HISTORY:  ?Past Medical History:  ?Diagnosis Date  ? Age related osteoporosis   ? Anemia   ? Barrett's esophagus   ? Bradycardia   ? Cancer (HCC)   ? PROSTATE  ? Cataract   ? CKD (chronic kidney disease) stage 3, GFR 30-59 ml/min (HCC)   ? Colon polyps   ? DDD (degenerative disc disease), cervical   ? DDD (degenerative disc disease), lumbar   ? Femur fracture (HCC)   ? Right  ? Fundic gland polyposis of stomach   ? Fundic gland polyps of stomach, benign   ? Gastritis   ? GERD (gastroesophageal reflux disease)   ? Hematuria   ? Hemorrhage of rectum and anus   ? History of colon polyps   ?  Insomnia   ? Lumbago   ? Osteopenia of the elderly   ? Skin cancer   ? Sleep apnea   ? ? ?SURGICAL HISTORY: ?Past Surgical History:  ?Procedure Laterality Date  ? BAND HEMORRHOIDECTOMY    ? COLONOSCOPY    ? COLONOSCOPY WITH PROPOFOL N/A 05/06/2018  ? Procedure: COLONOSCOPY WITH PROPOFOL;  Surgeon: Skulskie, Martin U, MD;  Location: [facility] ENDOSCOPY;  Service: Endoscopy;  Laterality: N/A;  ? COLONOSCOPY WITH PROPOFOL N/A 09/23/2018  ? Procedure: COLONOSCOPY WITH PROPOFOL;  Surgeon: Skulskie, Martin U, MD;  Location: [facility] ENDOSCOPY;  Service: Endoscopy;  Laterality: N/A;  ? COLONOSCOPY WITH PROPOFOL N/A 03/30/2021  ? Procedure: COLONOSCOPY WITH PROPOFOL;  Surgeon: Toledo, Teodoro K, MD;  Location: [facility] ENDOSCOPY;  Service: Gastroenterology;  Laterality: N/A;  ? CYSTOSCOPY W/ URETERAL STENT PLACEMENT Right 03/14/2022  ? Procedure: CYSTOSCOPY WITH RETROGRADE PYELOGRAM/URETERAL STENT PLACEMENT;  Surgeon: Stoioff, Scott C, MD;  Location: [facility] ORS;  Service: Urology;  Laterality: Right;  ? ESOPHAGOGASTRODUODENOSCOPY (EGD) WITH PROPOFOL N/A 10/03/2016  ? Procedure: ESOPHAGOGASTRODUODENOSCOPY (EGD) WITH PROPOFOL;  Surgeon: Martin U Skulskie, MD;  Location: [facility] ENDOSCOPY;  Service: Endoscopy;  Laterality: N/A;  ? EYE SURGERY    ? CATARACTS  ? EYELID SURGERY    ? FLEXIBLE SIGMOIDOSCOPY    ? FRACTURE SURGERY Right 2016  ? femur  ? FRACTURE SURGERY    ? ORIF DISTAL FEMUR FRACTURE    ? TONSILLECTOMY    ? TRANSURETHRAL RESECTION OF BLADDER TUMOR N/A 03/14/2022  ? Procedure: TRANSURETHRAL RESECTION OF BLADDER TUMOR (TURBT);  Surgeon: Stoioff, Scott C, MD;    Location: [facility] ORS;  Service: Urology;  Laterality: N/A;  ? ? ?SOCIAL HISTORY: ?Social History  ? ?Socioeconomic History  ? Marital status: Married  ?  Spouse name: Not on file  ? Number of children: Not on file  ? Years of education: Not on file  ? Highest education level: Not on file  ?Occupational History  ? Not on file  ?Tobacco Use  ? Smoking status: Former  ?  Packs/day: 1.00   ?  Years: 15.00  ?  Pack years: 15.00  ?  Types: Cigarettes  ?  Quit date: 10/03/1974  ?  Years since quitting: 47.5  ? Smokeless tobacco: Never  ?Vaping Use  ? Vaping Use: Never used  ?Substance and Sexual A

## 2022-04-04 ENCOUNTER — Other Ambulatory Visit (HOSPITAL_COMMUNITY): Payer: Self-pay

## 2022-04-04 ENCOUNTER — Telehealth: Payer: Self-pay | Admitting: Pharmacy Technician

## 2022-04-04 ENCOUNTER — Telehealth: Payer: Self-pay | Admitting: Oncology

## 2022-04-04 ENCOUNTER — Encounter: Payer: Self-pay | Admitting: Oncology

## 2022-04-04 MED ORDER — LENALIDOMIDE 5 MG PO CAPS: 5.0000 mg | ORAL_CAPSULE | Freq: Every day | ORAL | 0 refills | Status: DC

## 2022-04-04 NOTE — Telephone Encounter (Signed)
Oral Oncology Patient Advocate Encounter ? ?Prior Authorization for Lenalidomide (Revlimid) has been approved.   ? ?PA# [id number] ?Effective dates: 03/05/22 through 12/03/2098 ? ?Oral Oncology Clinic will continue to follow.  ? ?Dennison Nancy CPHT ?Specialty Pharmacy Patient Advocate ?[facility] ?Phone [phone] ?Fax [phone] ?04/04/2022 11:31 AM ? ?

## 2022-04-04 NOTE — Telephone Encounter (Signed)
Patient called to report that is has an appointment at [facility] on 5/15 and would like to reschedule his appointment for "lab MD Dara-velcade-dex" to the following day, 5/16.  ? ? ?Routing to clinical team to make aware.  ?

## 2022-04-04 NOTE — Telephone Encounter (Signed)
Ok to r/s to the next day (5/16) per pt request. Please keep appt on 5/8 as scheduled ?

## 2022-04-04 NOTE — Addendum Note (Signed)
Addended by: [name] on: 04/04/2022 09:27 AM ? ? Modules accepted: Orders ? ?

## 2022-04-04 NOTE — Telephone Encounter (Signed)
Oral Oncology Patient Advocate Encounter ?  ?Received notification from [employer] that prior authorization for Lenalidomide (Revlimid) is required. ?  ?PA submitted on CoverMyMeds ?Key [id number]  ?Status is pending ?  ?Oral Oncology Clinic will continue to follow. ? ?Dennison Nancy CPHT ?Specialty Pharmacy Patient Advocate ?[facility] ?Phone [phone] ?Fax [phone] ?04/04/2022 10:56 AM ? ?

## 2022-04-05 ENCOUNTER — Ambulatory Visit: Payer: Medicare Other | Admitting: Urology

## 2022-04-05 ENCOUNTER — Telehealth: Payer: Self-pay | Admitting: *Deleted

## 2022-04-05 NOTE — Telephone Encounter (Signed)
Patient called stating that he needs to speak with Dr Collie Siad nurse about something that has come up that will affect the scheduling of his treatments. He did not want to tell me and asked to speak directly with Benjamine Mola. Please return his call ?

## 2022-04-05 NOTE — Telephone Encounter (Signed)
Returned pt's call and spoke wife, Hassan Rowan, who states that pt has been scheduled next week on 5/15 to see Dr. Arlan Organ (uro onc surgeon) at [facility]. They want to know if this will affect any medications that Dr. Tasia Catchings is currently prescribing or plans to prescribe. Advised to keep appt on 5/8 with Dr. Tasia Catchings and all other questions will be addressed.  ? ?She also voiced that she has not heard back from [facility] to schedule Lenalidomide shipping.  I have reached out to [place] regarding this.  ?

## 2022-04-05 NOTE — Telephone Encounter (Signed)
Spoke to pharmacy, who states that [facility] should reach out to pt by Thursday. Informed [name] [name] of this and provide her with ph number for [facility].  ?

## 2022-04-06 NOTE — Telephone Encounter (Signed)
Received email from [facility], that patients Revlimid was delivered today.  Copay was $38. ? ? ? ?Dennison Nancy CPHT ?Specialty Pharmacy Patient Advocate ?[facility] ?Phone [phone] ?Fax [phone] ?04/06/2022 1:07 PM ? ?

## 2022-04-10 ENCOUNTER — Inpatient Hospital Stay: Payer: Medicare Other | Admitting: Pharmacist

## 2022-04-10 ENCOUNTER — Inpatient Hospital Stay: Payer: Medicare Other

## 2022-04-10 ENCOUNTER — Encounter: Payer: Self-pay | Admitting: Oncology

## 2022-04-10 ENCOUNTER — Inpatient Hospital Stay (HOSPITAL_BASED_OUTPATIENT_CLINIC_OR_DEPARTMENT_OTHER): Payer: Medicare Other | Admitting: Oncology

## 2022-04-10 VITALS — BP 124/56 | HR 46 | Temp 96.0°F | Resp 18 | Wt 147.2 lb

## 2022-04-10 VITALS — BP 131/46 | HR 44 | Temp 97.2°F | Resp 18

## 2022-04-10 DIAGNOSIS — D631 Anemia in chronic kidney disease: Secondary | ICD-10-CM

## 2022-04-10 DIAGNOSIS — N184 Chronic kidney disease, stage 4 (severe): Secondary | ICD-10-CM

## 2022-04-10 DIAGNOSIS — C9000 Multiple myeloma not having achieved remission: Secondary | ICD-10-CM

## 2022-04-10 DIAGNOSIS — Z5112 Encounter for antineoplastic immunotherapy: Secondary | ICD-10-CM | POA: Diagnosis not present

## 2022-04-10 DIAGNOSIS — C669 Malignant neoplasm of unspecified ureter: Secondary | ICD-10-CM | POA: Diagnosis not present

## 2022-04-10 LAB — COMPREHENSIVE METABOLIC PANEL
ALT: 14 U/L (ref 0–44)
AST: 16 U/L (ref 15–41)
Albumin: 3 g/dL — ABNORMAL LOW (ref 3.5–5.0)
Alkaline Phosphatase: 67 U/L (ref 38–126)
Anion gap: 4 — ABNORMAL LOW (ref 5–15)
BUN: 47 mg/dL — ABNORMAL HIGH (ref 8–23)
CO2: 26 mmol/L (ref 22–32)
Calcium: 8.4 mg/dL — ABNORMAL LOW (ref 8.9–10.3)
Chloride: 105 mmol/L (ref 98–111)
Creatinine, Ser: 3.01 mg/dL — ABNORMAL HIGH (ref 0.61–1.24)
GFR, Estimated: 20 mL/min — ABNORMAL LOW (ref >60)
Glucose, Bld: 125 mg/dL — ABNORMAL HIGH (ref 70–99)
Potassium: 4.7 mmol/L (ref 3.5–5.1)
Sodium: 135 mmol/L (ref 135–145)
Total Bilirubin: 0.5 mg/dL (ref 0.3–1.2)
Total Protein: 6.8 g/dL (ref 6.5–8.1)

## 2022-04-10 LAB — CBC WITH DIFFERENTIAL/PLATELET
Abs Immature Granulocytes: 0.01 10*3/uL (ref 0.00–0.07)
Basophils Absolute: 0 10*3/uL (ref 0.0–0.1)
Basophils Relative: 0 %
Eosinophils Absolute: 0.1 10*3/uL (ref 0.0–0.5)
Eosinophils Relative: 3 %
HCT: 32.5 % — ABNORMAL LOW (ref 39.0–52.0)
Hemoglobin: 10.6 g/dL — ABNORMAL LOW (ref 13.0–17.0)
Immature Granulocytes: 0 %
Lymphocytes Relative: 11 %
Lymphs Abs: 0.5 10*3/uL — ABNORMAL LOW (ref 0.7–4.0)
MCH: 29.9 pg (ref 26.0–34.0)
MCHC: 32.6 g/dL (ref 30.0–36.0)
MCV: 91.5 fL (ref 80.0–100.0)
Monocytes Absolute: 0.5 10*3/uL (ref 0.1–1.0)
Monocytes Relative: 13 %
Neutro Abs: 3.1 10*3/uL (ref 1.7–7.7)
Neutrophils Relative %: 73 %
Platelets: 163 10*3/uL (ref 150–400)
RBC: 3.55 MIL/uL — ABNORMAL LOW (ref 4.22–5.81)
RDW: 14.4 % (ref 11.5–15.5)
WBC: 4.3 10*3/uL (ref 4.0–10.5)
nRBC: 0 % (ref 0.0–0.2)

## 2022-04-10 MED ORDER — ASPIRIN EC 81 MG PO TBEC: 81.0000 mg | DELAYED_RELEASE_TABLET | Freq: Every day | ORAL | 11 refills | Status: DC

## 2022-04-10 MED ORDER — BORTEZOMIB CHEMO SQ INJECTION 3.5 MG (2.5MG/ML)
1.3000 mg/m2 | Freq: Once | INTRAMUSCULAR | Status: AC
  Administered 2022-04-10: 2.25 mg via SUBCUTANEOUS
  Filled 2022-04-10: qty 0.9

## 2022-04-10 MED ORDER — SODIUM CHLORIDE 0.9 % IV SOLN
20.0000 mg | Freq: Once | INTRAVENOUS | Status: DC
  Filled 2022-04-10: qty 2

## 2022-04-10 MED ORDER — DEXAMETHASONE 4 MG PO TABS
20.0000 mg | ORAL_TABLET | Freq: Once | ORAL | Status: AC
  Administered 2022-04-10: 20 mg via ORAL
  Filled 2022-04-10: qty 5

## 2022-04-10 MED ORDER — MONTELUKAST SODIUM 10 MG PO TABS
10.0000 mg | ORAL_TABLET | Freq: Once | ORAL | Status: AC
  Administered 2022-04-10: 10 mg via ORAL
  Filled 2022-04-10: qty 1

## 2022-04-10 MED ORDER — SODIUM CHLORIDE 0.9 % IV SOLN
Freq: Once | INTRAVENOUS | Status: DC
  Filled 2022-04-10: qty 250

## 2022-04-10 MED ORDER — ACETAMINOPHEN 325 MG PO TABS
650.0000 mg | ORAL_TABLET | Freq: Once | ORAL | Status: AC
  Administered 2022-04-10: 650 mg via ORAL
  Filled 2022-04-10: qty 2

## 2022-04-10 MED ORDER — DIPHENHYDRAMINE HCL 25 MG PO CAPS
50.0000 mg | ORAL_CAPSULE | Freq: Once | ORAL | Status: AC
  Administered 2022-04-10: 50 mg via ORAL
  Filled 2022-04-10: qty 2

## 2022-04-10 MED ORDER — DARATUMUMAB-HYALURONIDASE-FIHJ 1800-30000 MG-UT/15ML ~~LOC~~ SOLN
1800.0000 mg | Freq: Once | SUBCUTANEOUS | Status: AC
  Administered 2022-04-10: 1800 mg via SUBCUTANEOUS
  Filled 2022-04-10: qty 15

## 2022-04-10 NOTE — Progress Notes (Signed)
? ?Oral Chemotherapy Clinic ?[place]  ?[phone] [phone] ? ?Patient Care Team: ?Baxter Hire, MD as PCP - General (Internal Medicine) ?Noreene Filbert, MD as Radiation Oncologist (Radiation Oncology) ?Baxter Hire, MD (Internal Medicine) ?Lavonia Dana, MD as Consulting Physician (Nephrology) ?[name], MD as Consulting Physician (Oncology)  ? ?Name of the patient: [name] [name]  ?[id number]  ?1940-12-26  ? ?Date of visit: 04/10/22 ? ?HPI: Patient is a 81 y.o. male with  IgA kappa multiple myeloma. Patient is currently receiving daratumumab, bortezomib, and dexamethasone. He will added on lenalidomide to his regimen today 04/10/22. ? ?Reason for Consult: Lenalidomide oral chemotherapy education. ? ? ?PAST MEDICAL HISTORY: ?Past Medical History:  ?Diagnosis Date  ? Age related osteoporosis   ? Anemia   ? Barrett's esophagus   ? Bradycardia   ? Cancer [facility])   ? PROSTATE  ? Cataract   ? CKD (chronic kidney disease) stage 3, GFR 30-59 ml/min (HCC)   ? Colon polyps   ? DDD (degenerative disc disease), cervical   ? DDD (degenerative disc disease), lumbar   ? Femur fracture ([place])   ? Right  ? Fundic gland polyposis of stomach   ? Fundic gland polyps of stomach, benign   ? Gastritis   ? GERD (gastroesophageal reflux disease)   ? Hematuria   ? Hemorrhage of rectum and anus   ? History of colon polyps   ? Insomnia   ? Lumbago   ? Osteopenia of the elderly   ? Skin cancer   ? Sleep apnea   ? ? ?HEMATOLOGY/ONCOLOGY HISTORY:  ?Oncology History  ?Multiple myeloma ([place])  ?03/17/2022 Initial Diagnosis  ? Multiple myeloma ([place]) ? ?  ?03/17/2022 Cancer Staging  ? Staging form: Plasma Cell Myeloma and Plasma Cell Disorders, AJCC 8th Edition ?- Clinical stage from 03/17/2022: RISS Stage III (Beta-2-microglobulin (mg/L): 6.9, Albumin (g/dL): 3.3, ISS: Stage III, High-risk cytogenetics: Present, LDH: Normal) - Signed by [name], MD on 03/17/2022 ?Stage prefix: Initial diagnosis ?Beta 2  microglobulin range (mg/L): Greater than or equal to 5.5 ?Albumin range (g/dL): Less than 3.5 ?Cytogenetics: t(4;14) translocation, Other mutation, 1q addition ? ?  ?03/17/2022 - 03/17/2022 Chemotherapy  ? Patient is on Treatment Plan : MYELOMA NEWLY DIAGNOSED NON-TRANSPLANT CANDIDATE CyBorD q28d x 8 cycles  ? ?  ?  ?03/24/2022 -  Chemotherapy  ? Patient is on Treatment Plan : MYELOMA NEWLY DIAGNOSED Daratumumab + Dexamethasone Weekly (DaraRd) q28d  ? ?  ?  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 ?  ?03/17/2022 Cancer Staging  ? Staging form: Renal Pelvis and Ureter, AJCC 8th Edition ?- Clinical: Stage Unknown (cTX, cN0, cM0) - Signed by [name], MD on 03/17/2022 ?Stage prefix: Initial diagnosis ?WHO/ISUP grade (low/high): High Grade ?Histologic grading system: 2 grade system ? ?  ? ? ?ALLERGIES:  is allergic to demerol [meperidine hcl], demerol [meperidine], and nsaids. ? ?MEDICATIONS:  ?Current Outpatient Medications  ?Medication Sig Dispense Refill  ? acyclovir (ZOVIRAX) 200 MG capsule Take 1 capsule (200 mg total) by mouth 2 (two) times daily. 60 capsule 3  ? Cholecalciferol (VITAMIN D3) 125 MCG (5000 UT) CAPS Take 5,000 Units by mouth daily.    ? dexamethasone (DECADRON) 4 MG tablet Take 5 tablets (20 mg total) by mouth once a week. 20 tablet 0  ? ferrous sulfate 325 (65 FE) MG tablet Take 325 mg by mouth daily.    ?  HYDROcodone-acetaminophen (NORCO) 7.5-325 MG tablet 0.5-1 tab every 6 hours as needed for pain (Patient not taking: Reported on 03/24/2022) 8 tablet 0  ? ipratropium (ATROVENT) 0.03 % nasal spray Place 1 spray into both nostrils in the morning.    ? lenalidomide (REVLIMID) 5 MG capsule Take 1 capsule (5 mg total) by mouth daily. Take for 7 days, then hold for 7 days. 7 capsule 0  ? losartan (COZAAR) 25 MG tablet Take 25 mg by mouth daily.    ? melatonin 5 MG TABS Take 5 mg by mouth at bedtime.    ? montelukast  (SINGULAIR) 10 MG tablet Take 1 tablet (10 mg total) by mouth See admin instructions. Take 1 tablet the day prior to Daratumumab treatments, and take 1 tablet daily for 2 days after treatments 30 tablet 1  ? Multiple Vitamins-Minerals (MENS 50+ MULTIVITAMIN) TABS Take 1 tablet by mouth daily.    ? ondansetron (ZOFRAN) 8 MG tablet Take 1 tablet (8 mg total) by mouth 2 (two) times daily as needed (Nausea or vomiting). (Patient not taking: Reported on 04/03/2022) 30 tablet 1  ? traZODone (DESYREL) 50 MG tablet Take 25 mg by mouth at bedtime.    ? ?No current facility-administered medications for this visit.  ? ? ?VITAL SIGNS: ?There were no vitals taken for this visit. ?There were no vitals filed for this visit.  ?Estimated body mass index is 23.15 kg/m? as calculated from the following: ?  Height as of 03/24/22: '5\' 7"'  (1.702 m). ?  Weight as of 04/03/22: 67 kg (147 lb 12.8 oz). ? ?LABS: ?CBC: ?   ?Component Value Date/Time  ? WBC 5.5 04/03/2022 0819  ? HGB 10.8 (L) 04/03/2022 0819  ? HGB 14.4 03/25/2015 0531  ? HCT 33.2 (L) 04/03/2022 0819  ? HCT 44.3 03/25/2015 0531  ? PLT 165 04/03/2022 0819  ? PLT 142 (L) 03/25/2015 0531  ? MCV 92.5 04/03/2022 0819  ? MCV 88 03/25/2015 0531  ? NEUTROABS 4.3 04/03/2022 0819  ? NEUTROABS 6.3 03/25/2015 0531  ? LYMPHSABS 0.4 (L) 04/03/2022 9774  ? LYMPHSABS 0.8 (L) 03/25/2015 0531  ? MONOABS 0.5 04/03/2022 0819  ? MONOABS 0.8 03/25/2015 0531  ? EOSABS 0.3 04/03/2022 0819  ? EOSABS 0.4 03/25/2015 0531  ? BASOSABS 0.0 04/03/2022 0819  ? BASOSABS 0.0 03/25/2015 0531  ? ?Comprehensive Metabolic Panel: ?   ?Component Value Date/Time  ? NA 138 04/03/2022 0819  ? NA 139 03/24/2015 0645  ? K 4.2 04/03/2022 0819  ? K 4.4 03/24/2015 0645  ? CL 109 04/03/2022 0819  ? CL 108 03/24/2015 0645  ? CO2 23 04/03/2022 0819  ? CO2 24 03/24/2015 0645  ? BUN 45 (H) 04/03/2022 0819  ? BUN 20 03/24/2015 0645  ? CREATININE 3.23 (H) 04/03/2022 1423  ? CREATININE 1.24 03/24/2015 0645  ? GLUCOSE 107 (H) 04/03/2022  0819  ? GLUCOSE 113 (H) 03/24/2015 0645  ? CALCIUM 8.5 (L) 04/03/2022 0819  ? CALCIUM 8.2 (L) 03/24/2015 0645  ? AST 20 04/03/2022 0819  ? AST 26 03/22/2015 1108  ? ALT 13 04/03/2022 0819  ? ALT 21 03/22/2015 1108  ? ALKPHOS 75 04/03/2022 0819  ? ALKPHOS 70 03/22/2015 1108  ? BILITOT 0.5 04/03/2022 0819  ? BILITOT 0.8 03/22/2015 1108  ? PROT 7.1 04/03/2022 0819  ? PROT 6.9 03/22/2015 1108  ? ALBUMIN 3.0 (L) 04/03/2022 0819  ? ALBUMIN 3.9 03/22/2015 1108  ? ? ? ?Present during today's visit: patient and his wife ? ?Start  plan: Lenalidomide will be added to the patient's regimen today  ?  ?Patient Education ?I spoke with patient for overview of new oral chemotherapy medication: lenalidomide  ? ?Administration: ?Counseled patient on administration, dosing, side effects, monitoring, drug-food interactions, safe handling, storage, and disposal. ?Patient will take 1 capsule (5 mg total) by mouth daily. Take for 21 days, then hold for 7 days. ? ?**For the first cycle, Mr. Manke will take it for 7 days, then hold for 7 days to align with his ongoing regimen ? ?**Patient started his aspirin 4m daily ? ?Side Effects: ?Side effects include but not limited to: rash, fatigue, edema, diarrhea or constipation, decreased wbc/plt/hgb, nausea.   ? ?Drug-drug Interactions (DDI): ?No current DDIs with lenalidomide ? ?Adherence: ?After discussion with patient no patient barriers to medication adherence identified.  ?Reviewed with patient importance of keeping a medication schedule and plan for any missed doses. ? ?Mr. Douse voiced understanding and appreciation. All questions answered. Medication handout provided. ? ?[facility] Patient knows to call the office with questions or concerns. Oral [facility] will continue to follow. ? ?Patient expressed understanding and was in agreement with this plan. He also understands that He can call clinic at any time  with any questions, concerns, or complaints.  ? ?Medication Access Issues: ?No issues, patient fills at [facility]? ?Follow-up plan: RTC in 1 week ? ?Thank you for allowing me to participate in the care of this pat

## 2022-04-10 NOTE — Progress Notes (Signed)
Patient here for follow up. No new concerns. Pt reports that he has been on prolia in the past, which caused some vision loss. Pt requesting to add medication to allergy list because he does not want it administered in the future.. Pt has been tolerating infusion well.  ?

## 2022-04-10 NOTE — Progress Notes (Signed)
?Hematology/Oncology Progress note ?[phone] [phone] ?  ? ? ? ?Patient Care Team: ?Johnston, John D, MD as PCP - General (Internal Medicine) ?Chrystal, Glenn, MD as Radiation Oncologist (Radiation Oncology) ?Johnston, John D, MD (Internal Medicine) ?Kolluru, Sarath, MD as Consulting Physician (Nephrology) ?Yulissa Needham, MD as Consulting Physician (Oncology) ? ?REFERRING PROVIDER: ?Johnston, John D, MD ? ?CHIEF COMPLAINTS/REASON FOR VISIT:  ?Multiple myeloma, high-grade urothelial carcinoma. ? ?HISTORY OF PRESENTING ILLNESS:  ?[name] [name] is a 81 y.o. male who was seen in consultation at the request of Johnston, John D, MD presents for follow-up of multiple myeloma and high-grade urothelial carcinoma management.   ? ?Patient has progressively worsening kidney function.  He is scheduled for kidney biopsy. ?01/26/2022, free kappa light chain 1058, lambda 15.3, light chain ratio 69. ?Protein electrophoresis showed restricted band-M spike migrating in the beta-1 globulin region. ?ANA negative.  ANCA negative. ?Random urine protein electrophoresis showed abnormal protein band detected in the gamma globulin And a second possible abnormal protein band that may represent monoclonal protein. ? ?Patient denies any back pain, unintentional weight loss, night sweats or fever. ?He lives at home with wife. ? 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 ?02/23/2022 multiple myeloma showed IgA 2883, M protein of 1.8, beta 2 microglobulin 6.9, kappa free light chain 1268.9, lambda 13.2, free light chain ratio 96.13.   ?CMP showed creatinine of 3.41, that is significantly worse than his baseline in June 2022. ?02/28/2022, 24-hour urine protein electrophoresis showed M protein of 729 mg, IgA monoclonal kappa light chain. ?03/01/2022, [facility] kidney biopsy showed diffuse light chain tubulopathy, and light chain cast nephropathy, moderate interstitial fibrosis,4%  global glomerular sclerosis and the marked atherosclerosis. ? ?03/02/2022 PET scan showed no definitive signs of multiple myeloma by FDG PET. ?Obstructive right ureter with soft tissue mass in the right pelvis showing increased metabolic activity suspicious for urothelial neoplasm.  Little or no excretion of FDG on the RIGHT but still with uptake of FDG by renal parenchyma. Degree of hydronephrosis is not substantially changed but lack of FDG excretion on the RIGHT is compatible with secondary sign of physiologic significance of ?ureteral obstruction. RIGHT pelvic sidewall uptake without visible lesion, could represent tiny lymph node not visible on noncontrast imaging. If the patient is no longer and acute renal failure could consider MRI with and without contrast for further assessment of pelvic findings. Ultimately cystoscopy may be helpful for further assessment, aortic atherosclerosis, pulmonary emphysema. ? ? ?03/06/2022, patient underwent a bone marrow biopsy. ?Pathology showed hypercellular bone marrow with plasma cell neoplasm, representing 20% of all cells in the aspirate associated with prominent interstitial infiltrates and numerous predominantly small clusters in the clots in the biopsy sections.  The plasma cells display kappa light chain restriction consistent with plasma cell neoplasm. ?Normal cytogenetics, myeloma FISH panel positive for 13q deletion, gain of 1q, t (4;14) ? ?Patient received 20 mg dexamethasone x1 after bone marrow biopsy. ? ?03/14/2022, cystoscopy showed diffuse narrowing right distal ureter with hydroureter following up the distal ureter and extending proximally.  Right ureteroscopy showed nodular tumor distal ureter and a convincing area without tumor suspicious for extrinsic obstruction.s/p   right ureteral stent placement. Ureteral tumor showed a tiny fragments of fibrous tissue, interpretation limited by thermal and crush artifact.  Right distal ureter saline barbotage showed  hight grade urothelial carcinoma.  ? ?INTERVAL HISTORY ?[name] [name] is a 81 y.o. male who has above history reviewed by me   today presents for follow up visit for multiple myeloma and high-grade urothelial carcinoma. ? ?Patient has tolerated daratumumab/Velcade treatments.  He has received the Revlimid 5 mg supply. ?He has an appointment with neurologist at [facility] for evaluation of high-grade urothelial carcinoma surgery.  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 ?Patient has no new complaints.  He has couple questions regarding treatment plan potential side effects. ? ?Review of Systems  ?Constitutional:  Positive for fatigue. Negative for appetite change, chills, fever and unexpected weight change.  ?HENT:   Negative for hearing loss and voice change.   ?Eyes:  Negative for eye problems and icterus.  ?Respiratory:  Negative for chest tightness, cough and shortness of breath.   ?Cardiovascular:  Negative for chest pain and leg swelling.  ?Gastrointestinal:  Negative for abdominal distention and abdominal pain.  ?Endocrine: Negative for hot flashes.  ?Genitourinary:  Negative for difficulty urinating, dysuria and frequency.   ?Musculoskeletal:  Negative for arthralgias.  ?Skin:  Negative for itching and rash.  ?Neurological:  Negative for light-headedness and numbness.  ?Hematological:  Negative for adenopathy. Does not bruise/bleed easily.  ?Psychiatric/Behavioral:  Negative for confusion.   ? ? ?MEDICAL HISTORY:  ?Past Medical History:  ?Diagnosis Date  ? Age related osteoporosis   ? Anemia   ? Barrett's esophagus   ? Bradycardia   ? Cancer [facility])   ? PROSTATE  ? Cataract   ? CKD (chronic kidney disease) stage 3, GFR 30-59 ml/min (HCC)   ? Colon polyps   ? DDD (degenerative disc disease), cervical   ? DDD (degenerative disc disease), lumbar   ? Femur fracture ([place])   ? Right  ? Fundic gland polyposis of stomach   ? Fundic gland polyps of stomach, benign   ? Gastritis    ? GERD (gastroesophageal reflux disease)   ? Hematuria   ? Hemorrhage of rectum and anus   ? History of colon polyps   ? Insomnia   ? Lumbago   ? Osteopenia of the elderly   ? Skin cancer   ? Sleep apnea   ? ? ?SURGICAL HISTORY: ?Past Surgical History:  ?Procedure Laterality Date  ? BAND HEMORRHOIDECTOMY    ? COLONOSCOPY    ? COLONOSCOPY WITH PROPOFOL N/A 05/06/2018  ? Procedure: COLONOSCOPY WITH PROPOFOL;  Surgeon: Lollie Sails, MD;  Location: [facility] ENDOSCOPY;  Service: Endoscopy;  Laterality: N/A;  ? COLONOSCOPY WITH PROPOFOL N/A 09/23/2018  ? Procedure: COLONOSCOPY WITH PROPOFOL;  Surgeon: Lollie Sails, MD;  Location: [facility] ENDOSCOPY;  Service: Endoscopy;  Laterality: N/A;  ? COLONOSCOPY WITH PROPOFOL N/A 03/30/2021  ? Procedure: COLONOSCOPY WITH PROPOFOL;  Surgeon: Toledo, Benay Pike, MD;  Location: [facility] ENDOSCOPY;  Service: Gastroenterology;  Laterality: N/A;  ? CYSTOSCOPY W/ URETERAL STENT PLACEMENT Right 03/14/2022  ? Procedure: CYSTOSCOPY WITH RETROGRADE PYELOGRAM/URETERAL STENT PLACEMENT;  Surgeon: Abbie Sons, MD;  Location: [facility] ORS;  Service: Urology;  Laterality: Right;  ? ESOPHAGOGASTRODUODENOSCOPY (EGD) WITH PROPOFOL N/A 10/03/2016  ? Procedure: ESOPHAGOGASTRODUODENOSCOPY (EGD) WITH PROPOFOL;  Surgeon: Lollie Sails, MD;  Location[facility] ENDOSCOPY;  Service: Endoscopy;  Laterality: N/A;  ? EYE SURGERY    ? CATARACTS  ? EYELID SURGERY    ? FLEXIBLE SIGMOIDOSCOPY    ? FRACTURE SURGERY Right 2016  ? femur  ? FRACTURE SURGERY    ? ORIF DISTAL FEMUR FRACTURE    ? TONSILLECTOMY    ? TRANSURETHRAL RESECTION OF BLADDER TUMOR N/A 03/14/2022  ?  Procedure: TRANSURETHRAL RESECTION OF BLADDER TUMOR (TURBT);  Surgeon: Abbie Sons, MD;  Location: [facility] ORS;  Service: Urology;  Laterality: N/A;  ? ? ?SOCIAL HISTORY: ?Social History  ? ?Socioeconomic History  ? Marital status: Married  ?  Spouse name: Not on file  ? Number of children: Not on file  ? Years of education: Not on file  ? Highest  education level: Not on file  ?Occupational History  ? Not on file  ?Tobacco Use  ? Smoking status: Former  ?  Packs/day: 1.00  ?  Years: 15.00  ?  Pack years: 15.00  ?  Types: Cigarettes  ?  Quit date: 10/31/1

## 2022-04-10 NOTE — Addendum Note (Signed)
Addended by: [name] on: 04/10/2022 11:17 PM ? ? Modules accepted: Orders ? ?

## 2022-04-13 ENCOUNTER — Other Ambulatory Visit: Payer: Self-pay | Admitting: *Deleted

## 2022-04-13 DIAGNOSIS — C9000 Multiple myeloma not having achieved remission: Secondary | ICD-10-CM

## 2022-04-14 ENCOUNTER — Encounter: Payer: Self-pay | Admitting: Oncology

## 2022-04-14 ENCOUNTER — Other Ambulatory Visit: Payer: Self-pay | Admitting: Oncology

## 2022-04-14 ENCOUNTER — Telehealth: Payer: Self-pay | Admitting: *Deleted

## 2022-04-14 ENCOUNTER — Other Ambulatory Visit: Payer: Self-pay

## 2022-04-14 MED ORDER — LENALIDOMIDE 5 MG PO CAPS: 5.0000 mg | ORAL_CAPSULE | ORAL | 0 refills | Status: DC

## 2022-04-14 NOTE — Telephone Encounter (Signed)
Wife called reporting that patient has injured himself in a very dirty garage and has a gash in head in the hair 3/4 - 1 inch long in a U shape, one side is open with a hole and the other seems to be closing up. The bleeding has stopped with the use of ice and pressure, but her concern is for possible development of an infection. I asked if he is up to date with his Tetanus and they both stated  that it has been over 10 years sine he had one. I advised that they either call Dr Wynetta Emery, [facility] PCP office to be checked and or go to [place] in Clinic to be checked and possibly get a Tetanus vaccine, antibiotics, and if needed sutures. She had concerns about going through a crowed lobby but decided she will go in and register him and have him wait in the car until they are ready for him. ?

## 2022-04-17 ENCOUNTER — Ambulatory Visit: Payer: Medicare Other | Admitting: Oncology

## 2022-04-17 ENCOUNTER — Ambulatory Visit: Payer: Medicare Other

## 2022-04-17 ENCOUNTER — Other Ambulatory Visit: Payer: Medicare Other

## 2022-04-17 DIAGNOSIS — C689 Malignant neoplasm of urinary organ, unspecified: Secondary | ICD-10-CM | POA: Insufficient documentation

## 2022-04-18 ENCOUNTER — Inpatient Hospital Stay: Payer: Medicare Other

## 2022-04-18 ENCOUNTER — Other Ambulatory Visit: Payer: Self-pay

## 2022-04-18 ENCOUNTER — Ambulatory Visit
Admission: RE | Admit: 2022-04-18 | Discharge: 2022-04-18 | Disposition: A | Discharge status: Home / Self Care | Payer: Medicare Other | Source: Ambulatory Visit | Attending: Oncology | Admitting: Oncology

## 2022-04-18 ENCOUNTER — Ambulatory Visit: Payer: Medicare Other | Admitting: Oncology

## 2022-04-18 ENCOUNTER — Other Ambulatory Visit: Payer: Self-pay | Admitting: Oncology

## 2022-04-18 VITALS — BP 124/48 | HR 47 | Temp 97.1°F | Resp 18 | Wt 149.9 lb

## 2022-04-18 DIAGNOSIS — M25552 Pain in left hip: Secondary | ICD-10-CM | POA: Insufficient documentation

## 2022-04-18 DIAGNOSIS — M25551 Pain in right hip: Secondary | ICD-10-CM

## 2022-04-18 DIAGNOSIS — R52 Pain, unspecified: Secondary | ICD-10-CM

## 2022-04-18 DIAGNOSIS — C9000 Multiple myeloma not having achieved remission: Secondary | ICD-10-CM

## 2022-04-18 DIAGNOSIS — Z5112 Encounter for antineoplastic immunotherapy: Secondary | ICD-10-CM | POA: Diagnosis not present

## 2022-04-18 LAB — CBC WITH DIFFERENTIAL/PLATELET
Abs Immature Granulocytes: 0.01 10*3/uL (ref 0.00–0.07)
Basophils Absolute: 0 10*3/uL (ref 0.0–0.1)
Basophils Relative: 0 %
Eosinophils Absolute: 0.1 10*3/uL (ref 0.0–0.5)
Eosinophils Relative: 3 %
HCT: 32.1 % — ABNORMAL LOW (ref 39.0–52.0)
Hemoglobin: 10.4 g/dL — ABNORMAL LOW (ref 13.0–17.0)
Immature Granulocytes: 0 %
Lymphocytes Relative: 10 %
Lymphs Abs: 0.4 10*3/uL — ABNORMAL LOW (ref 0.7–4.0)
MCH: 30.2 pg (ref 26.0–34.0)
MCHC: 32.4 g/dL (ref 30.0–36.0)
MCV: 93.3 fL (ref 80.0–100.0)
Monocytes Absolute: 0.4 10*3/uL (ref 0.1–1.0)
Monocytes Relative: 10 %
Neutro Abs: 2.9 10*3/uL (ref 1.7–7.7)
Neutrophils Relative %: 77 %
Platelets: 148 10*3/uL — ABNORMAL LOW (ref 150–400)
RBC: 3.44 MIL/uL — ABNORMAL LOW (ref 4.22–5.81)
RDW: 14.6 % (ref 11.5–15.5)
WBC: 3.8 10*3/uL — ABNORMAL LOW (ref 4.0–10.5)
nRBC: 0 % (ref 0.0–0.2)

## 2022-04-18 LAB — COMPREHENSIVE METABOLIC PANEL
ALT: 16 U/L (ref 0–44)
AST: 19 U/L (ref 15–41)
Albumin: 3.1 g/dL — ABNORMAL LOW (ref 3.5–5.0)
Alkaline Phosphatase: 67 U/L (ref 38–126)
Anion gap: 7 (ref 5–15)
BUN: 41 mg/dL — ABNORMAL HIGH (ref 8–23)
CO2: 24 mmol/L (ref 22–32)
Calcium: 8.2 mg/dL — ABNORMAL LOW (ref 8.9–10.3)
Chloride: 106 mmol/L (ref 98–111)
Creatinine, Ser: 2.98 mg/dL — ABNORMAL HIGH (ref 0.61–1.24)
GFR, Estimated: 20 mL/min — ABNORMAL LOW (ref >60)
Glucose, Bld: 120 mg/dL — ABNORMAL HIGH (ref 70–99)
Potassium: 4.3 mmol/L (ref 3.5–5.1)
Sodium: 137 mmol/L (ref 135–145)
Total Bilirubin: 0.5 mg/dL (ref 0.3–1.2)
Total Protein: 6.5 g/dL (ref 6.5–8.1)

## 2022-04-18 MED ORDER — DEXAMETHASONE 4 MG PO TABS
20.0000 mg | ORAL_TABLET | Freq: Once | ORAL | Status: AC
  Administered 2022-04-18: 20 mg via ORAL
  Filled 2022-04-18: qty 5

## 2022-04-18 MED ORDER — ACETAMINOPHEN 325 MG PO TABS
650.0000 mg | ORAL_TABLET | Freq: Once | ORAL | Status: AC
  Administered 2022-04-18: 650 mg via ORAL
  Filled 2022-04-18: qty 2

## 2022-04-18 MED ORDER — DARATUMUMAB-HYALURONIDASE-FIHJ 1800-30000 MG-UT/15ML ~~LOC~~ SOLN
1800.0000 mg | Freq: Once | SUBCUTANEOUS | Status: AC
  Administered 2022-04-18: 1800 mg via SUBCUTANEOUS
  Filled 2022-04-18: qty 15

## 2022-04-18 MED ORDER — BORTEZOMIB CHEMO SQ INJECTION 3.5 MG (2.5MG/ML)
1.3000 mg/m2 | Freq: Once | INTRAMUSCULAR | Status: AC
  Administered 2022-04-18: 2.25 mg via SUBCUTANEOUS
  Filled 2022-04-18: qty 0.9

## 2022-04-18 MED ORDER — DIPHENHYDRAMINE HCL 25 MG PO CAPS
50.0000 mg | ORAL_CAPSULE | Freq: Once | ORAL | Status: AC
  Administered 2022-04-18: 50 mg via ORAL
  Filled 2022-04-18: qty 2

## 2022-04-18 NOTE — Patient Instructions (Signed)
[facility] CANCER CTR AT [place]  Discharge Instructions: ?Thank you for choosing [facility] to provide your oncology and hematology care.  ?If you have a lab appointment with the [facility], please go directly to the [facility] and check in at the registration area. ? ?Wear comfortable clothing and clothing appropriate for easy access to any Portacath or PICC line.  ? ?We strive to give you quality time with your provider. You may need to reschedule your appointment if you arrive late (15 or more minutes).  Arriving late affects you and other patients whose appointments are after yours.  Also, if you miss three or more appointments without notifying the office, you may be dismissed from the clinic at the provider?s discretion.    ?  ?For prescription refill requests, have your pharmacy contact our office and allow 72 hours for refills to be completed.   ? ?Today you received the following chemotherapy and/or immunotherapy agents Darzalex, Velcade    ?  ?To help prevent nausea and vomiting after your treatment, we encourage you to take your nausea medication as directed. ? ?BELOW ARE SYMPTOMS THAT SHOULD BE REPORTED IMMEDIATELY: ?*FEVER GREATER THAN 100.4 F (38 ?C) OR HIGHER ?*CHILLS OR SWEATING ?*NAUSEA AND VOMITING THAT IS NOT CONTROLLED WITH YOUR NAUSEA MEDICATION ?*UNUSUAL SHORTNESS OF BREATH ?*UNUSUAL BRUISING OR BLEEDING ?*URINARY PROBLEMS (pain or burning when urinating, or frequent urination) ?*BOWEL PROBLEMS (unusual diarrhea, constipation, pain near the anus) ?TENDERNESS IN MOUTH AND THROAT WITH OR WITHOUT PRESENCE OF ULCERS (sore throat, sores in mouth, or a toothache) ?UNUSUAL RASH, SWELLING OR PAIN  ?UNUSUAL VAGINAL DISCHARGE OR ITCHING  ? ?Items with * indicate a potential emergency and should be followed up as soon as possible or go to the Emergency Department if any problems should occur. ? ?Please show the CHEMOTHERAPY ALERT CARD or IMMUNOTHERAPY ALERT CARD at  check-in to the Emergency Department and triage nurse. ? ?Should you have questions after your visit or need to cancel or reschedule your appointment, please contact [facility] CANCER [place] AT [place]  [phone] and follow the prompts.  Office hours are 8:00 a.m. to 4:30 p.m. Monday - Friday. Please note that voicemails left after 4:00 p.m. may not be returned until the following business day.  We are closed weekends and major holidays. You have access to a nurse at all times for urgent questions. Please call the main number to the clinic [phone] and follow the prompts. ? ?For any non-urgent questions, you may also contact your provider using MyChart. We now offer e-Visits for anyone 31 and older to request care online for non-urgent symptoms. For details visit [email or web address]. ?  ?Also download the MyChart app! Go to the app store, search "MyChart", open the app, select [facility], and log in with your MyChart username and password. ? ?Due to Covid, a mask is required upon entering the hospital/clinic. If you do not have a mask, one will be given to you upon arrival. For doctor visits, patients may have 1 support person aged 25 or older with them. For treatment visits, patients cannot have anyone with them due to current Covid guidelines and our immunocompromised population.  ?

## 2022-04-18 NOTE — Progress Notes (Signed)
Nutrition Assessment ? ? ?Reason for Assessment:  ?Patient with questions regarding diet for kidneys ? ? ?ASSESSMENT:  ?81 year old male with multiple myeloma and high grade urothelial carcinoma.  Past medical history of barrett's esophagus, GERD, prostate cancer, CKD.  Patient receiving daratumumab/velcade and revlimid.   ? ?Met with patient today during infusion.  Patient reports good appetite. Denies any nutrition impact symptoms at this time.  Wanting to know good foods to protect kidneys.   ? ? ?Medications: calcium, Vit D, dexamethasone, ferrous sulfate, MVI, zofran ? ? ?Labs: glucose 120, BUN 41, creatinine 2.98, Ca 8.2 ? ? ?Anthropometrics:  ? ?Height: 67 inches ?Weight: 149 lb 14.6 ?Stable weight ?BMI: 23 ? ? 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 ? ?INTERVENTION:  ?Encouraged low sodium diet. Handout provided from [facility] along with recipes for sodium free seasonings. ?Encouraged plant foods and lean, moderate amounts of protein ?Encouraged hydration ?Patient has contact information and can contact RD as needed ? ? ?Next Visit: no follow-up ?RD available as needed ? ?Parvin Stetzer B. Zenia Resides, RD, LDN ?Registered Dietitian ?[phone] ? ? ? ? ? ?

## 2022-04-19 LAB — KAPPA/LAMBDA LIGHT CHAINS
Kappa free light chain: 216.7 mg/L — ABNORMAL HIGH (ref 3.3–19.4)
Kappa, lambda light chain ratio: 20.25 — ABNORMAL HIGH (ref 0.26–1.65)
Lambda free light chains: 10.7 mg/L (ref 5.7–26.3)

## 2022-04-21 LAB — MULTIPLE MYELOMA PANEL, SERUM
Albumin SerPl Elph-Mcnc: 3.5 g/dL (ref 2.9–4.4)
Albumin/Glob SerPl: 1.3 (ref 0.7–1.7)
Alpha 1: 0.2 g/dL (ref 0.0–0.4)
Alpha2 Glob SerPl Elph-Mcnc: 0.6 g/dL (ref 0.4–1.0)
B-Globulin SerPl Elph-Mcnc: 1.6 g/dL — ABNORMAL HIGH (ref 0.7–1.3)
Gamma Glob SerPl Elph-Mcnc: 0.3 g/dL — ABNORMAL LOW (ref 0.4–1.8)
Globulin, Total: 2.8 g/dL (ref 2.2–3.9)
IgA: 1617 mg/dL — ABNORMAL HIGH (ref 61–437)
IgG (Immunoglobin G), Serum: 384 mg/dL — ABNORMAL LOW (ref 603–1613)
IgM (Immunoglobulin M), Srm: 30 mg/dL (ref 15–143)
M Protein SerPl Elph-Mcnc: 1.1 g/dL — ABNORMAL HIGH
Total Protein ELP: 6.3 g/dL (ref 6.0–8.5)

## 2022-04-25 ENCOUNTER — Inpatient Hospital Stay: Payer: Medicare Other

## 2022-04-25 ENCOUNTER — Inpatient Hospital Stay (HOSPITAL_BASED_OUTPATIENT_CLINIC_OR_DEPARTMENT_OTHER): Payer: Medicare Other | Admitting: Oncology

## 2022-04-25 ENCOUNTER — Encounter: Payer: Self-pay | Admitting: Oncology

## 2022-04-25 VITALS — BP 116/87 | HR 99 | Temp 96.8°F | Resp 18 | Wt 146.3 lb

## 2022-04-25 VITALS — BP 111/48 | HR 45 | Resp 18

## 2022-04-25 DIAGNOSIS — C9000 Multiple myeloma not having achieved remission: Secondary | ICD-10-CM

## 2022-04-25 DIAGNOSIS — N184 Chronic kidney disease, stage 4 (severe): Secondary | ICD-10-CM | POA: Diagnosis not present

## 2022-04-25 DIAGNOSIS — Z5111 Encounter for antineoplastic chemotherapy: Secondary | ICD-10-CM

## 2022-04-25 DIAGNOSIS — Z5112 Encounter for antineoplastic immunotherapy: Secondary | ICD-10-CM | POA: Diagnosis not present

## 2022-04-25 DIAGNOSIS — C669 Malignant neoplasm of unspecified ureter: Secondary | ICD-10-CM | POA: Diagnosis not present

## 2022-04-25 DIAGNOSIS — D631 Anemia in chronic kidney disease: Secondary | ICD-10-CM

## 2022-04-25 LAB — CBC WITH DIFFERENTIAL/PLATELET
Abs Immature Granulocytes: 0.01 10*3/uL (ref 0.00–0.07)
Basophils Absolute: 0 10*3/uL (ref 0.0–0.1)
Basophils Relative: 1 %
Eosinophils Absolute: 0.2 10*3/uL (ref 0.0–0.5)
Eosinophils Relative: 4 %
HCT: 30.4 % — ABNORMAL LOW (ref 39.0–52.0)
Hemoglobin: 10.1 g/dL — ABNORMAL LOW (ref 13.0–17.0)
Immature Granulocytes: 0 %
Lymphocytes Relative: 14 %
Lymphs Abs: 0.6 10*3/uL — ABNORMAL LOW (ref 0.7–4.0)
MCH: 31 pg (ref 26.0–34.0)
MCHC: 33.2 g/dL (ref 30.0–36.0)
MCV: 93.3 fL (ref 80.0–100.0)
Monocytes Absolute: 1 10*3/uL (ref 0.1–1.0)
Monocytes Relative: 22 %
Neutro Abs: 2.6 10*3/uL (ref 1.7–7.7)
Neutrophils Relative %: 59 %
Platelets: 161 10*3/uL (ref 150–400)
RBC: 3.26 MIL/uL — ABNORMAL LOW (ref 4.22–5.81)
RDW: 14.3 % (ref 11.5–15.5)
WBC: 4.4 10*3/uL (ref 4.0–10.5)
nRBC: 0 % (ref 0.0–0.2)

## 2022-04-25 LAB — COMPREHENSIVE METABOLIC PANEL
ALT: 15 U/L (ref 0–44)
AST: 19 U/L (ref 15–41)
Albumin: 3.2 g/dL — ABNORMAL LOW (ref 3.5–5.0)
Alkaline Phosphatase: 63 U/L (ref 38–126)
Anion gap: 7 (ref 5–15)
BUN: 43 mg/dL — ABNORMAL HIGH (ref 8–23)
CO2: 23 mmol/L (ref 22–32)
Calcium: 8.2 mg/dL — ABNORMAL LOW (ref 8.9–10.3)
Chloride: 106 mmol/L (ref 98–111)
Creatinine, Ser: 2.89 mg/dL — ABNORMAL HIGH (ref 0.61–1.24)
GFR, Estimated: 21 mL/min — ABNORMAL LOW (ref >60)
Glucose, Bld: 98 mg/dL (ref 70–99)
Potassium: 4.1 mmol/L (ref 3.5–5.1)
Sodium: 136 mmol/L (ref 135–145)
Total Bilirubin: 0.8 mg/dL (ref 0.3–1.2)
Total Protein: 6.2 g/dL — ABNORMAL LOW (ref 6.5–8.1)

## 2022-04-25 MED ORDER — ACETAMINOPHEN 325 MG PO TABS
650.0000 mg | ORAL_TABLET | Freq: Once | ORAL | Status: AC
  Administered 2022-04-25: 650 mg via ORAL
  Filled 2022-04-25: qty 2

## 2022-04-25 MED ORDER — GABAPENTIN 300 MG PO CAPS: 300.0000 mg | ORAL_CAPSULE | Freq: Every day | ORAL | 0 refills | Status: DC

## 2022-04-25 MED ORDER — DEXAMETHASONE 4 MG PO TABS
20.0000 mg | ORAL_TABLET | Freq: Once | ORAL | Status: AC
  Administered 2022-04-25: 20 mg via ORAL
  Filled 2022-04-25: qty 5

## 2022-04-25 MED ORDER — DIPHENHYDRAMINE HCL 25 MG PO CAPS
50.0000 mg | ORAL_CAPSULE | Freq: Once | ORAL | Status: AC
  Administered 2022-04-25: 50 mg via ORAL
  Filled 2022-04-25: qty 2

## 2022-04-25 MED ORDER — DARATUMUMAB-HYALURONIDASE-FIHJ 1800-30000 MG-UT/15ML ~~LOC~~ SOLN
1800.0000 mg | Freq: Once | SUBCUTANEOUS | Status: AC
  Administered 2022-04-25: 1800 mg via SUBCUTANEOUS
  Filled 2022-04-25: qty 15

## 2022-04-25 MED ORDER — BORTEZOMIB CHEMO SQ INJECTION 3.5 MG (2.5MG/ML)
1.3000 mg/m2 | Freq: Once | INTRAMUSCULAR | Status: AC
  Administered 2022-04-25: 2.25 mg via SUBCUTANEOUS
  Filled 2022-04-25: qty 0.9

## 2022-04-25 NOTE — Progress Notes (Signed)
Patient here for follow up. Pt reports that he is having skin sensitivity and easy bruising.

## 2022-04-25 NOTE — Progress Notes (Signed)
Hematology/Oncology Progress note [phone] [phone]      Patient Care Team: Baxter Hire, MD as PCP - General (Internal Medicine) Noreene Filbert, MD as Radiation Oncologist (Radiation Oncology) Baxter Hire, MD (Internal Medicine) Lavonia Dana, MD as Consulting Physician (Nephrology) [name], MD as Consulting Physician (Oncology)  REFERRING PROVIDER: Baxter Hire, MD  CHIEF COMPLAINTS/REASON FOR VISIT:  Multiple myeloma, high-grade urothelial carcinoma.  HISTORY OF PRESENTING ILLNESS:  [name] [name] is a 81 y.o. male who was seen in consultation at the request of Baxter Hire, MD presents for follow-up of multiple myeloma and high-grade urothelial carcinoma management.    Patient has progressively worsening kidney function.  He is scheduled for kidney biopsy. 01/26/2022, free kappa light chain 1058, lambda 15.3, light chain ratio 69. Protein electrophoresis showed restricted band-M spike migrating in the beta-1 globulin region. ANA negative.  ANCA negative. Random urine protein electrophoresis showed abnormal protein band detected in the gamma globulin And a second possible abnormal protein band that may represent monoclonal protein.  Patient denies any back pain, unintentional weight loss, night sweats or fever. He lives at home with wife.  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   CMP showed creatinine of 3.41, that is significantly worse than his baseline in June 2022. 02/28/2022, 24-hour urine protein electrophoresis showed M protein of 729 mg, IgA monoclonal kappa light chain. 03/01/2022, [facility] kidney biopsy showed diffuse light chain tubulopathy, and light chain cast nephropathy, moderate interstitial fibrosis,4%  global glomerular sclerosis and the marked atherosclerosis.  03/02/2022 PET scan showed no definitive signs of multiple myeloma by FDG PET. Obstructive right ureter with soft tissue mass in the right pelvis showing increased metabolic activity suspicious for urothelial neoplasm.  Little or no excretion of FDG on the RIGHT but still with uptake of FDG by renal parenchyma. Degree of hydronephrosis is not substantially changed but lack of FDG excretion on the RIGHT is compatible with secondary sign of physiologic significance of ureteral obstruction. RIGHT pelvic sidewall uptake without visible lesion, could represent tiny lymph node not visible on noncontrast imaging. If the patient is no longer and acute renal failure could consider MRI with and without contrast for further assessment of pelvic findings. Ultimately cystoscopy may be helpful for further assessment, aortic atherosclerosis, pulmonary emphysema.   03/06/2022, patient underwent a bone marrow biopsy. Pathology showed hypercellular bone marrow with plasma cell neoplasm, representing 20% of all cells in the aspirate associated with prominent interstitial infiltrates and numerous predominantly small clusters in the clots in the biopsy sections.  The plasma cells display kappa light chain restriction consistent with plasma cell neoplasm. Normal cytogenetics, myeloma FISH panel positive for 13q deletion, gain of 1q, t (4;14)  Patient received 20 mg dexamethasone x1 after bone marrow biopsy.  03/14/2022, cystoscopy showed diffuse narrowing right distal ureter with hydroureter following up the distal ureter and extending proximally.  Right ureteroscopy showed nodular tumor distal ureter and a convincing area without tumor suspicious for extrinsic obstruction.s/p   right ureteral stent placement. Ureteral tumor showed a tiny fragments of fibrous tissue, interpretation limited by thermal and crush artifact.  Right distal ureter saline barbotage showed  hight grade urothelial carcinoma.   INTERVAL HISTORY [name] [name] is a 81 y.o. male who has above history reviewed by me  today presents for follow up visit for multiple myeloma and high-grade urothelial carcinoma.  Patient has tolerated daratumumab/Velcade treatments.  Patient tolerated Revlimid 5 mg  During interval, patient has been evaluated by [facility] urology.  Surgical options of nephroureterectomy versus distal ureterectomy and reimplant.  Patient is going to get the Lasix scan and MRI.  There is plan of repeat ureteroscopy.  Review of Systems  Constitutional:  Positive for fatigue. Negative for appetite change, chills, fever and unexpected weight change.  HENT:   Negative for hearing loss and voice change.   Eyes:  Negative for eye problems and icterus.  Respiratory:  Negative for chest tightness, cough and shortness of breath.   Cardiovascular:  Negative for chest pain and leg swelling.  Gastrointestinal:  Negative for abdominal distention and abdominal pain.  Endocrine: Negative for hot flashes.  Genitourinary:  Negative for difficulty urinating, dysuria and frequency.   Musculoskeletal:  Negative for arthralgias.  Skin:  Negative for itching and rash.  Neurological:  Negative for light-headedness and numbness.  Hematological:  Negative for adenopathy. Does not bruise/bleed easily.  Psychiatric/Behavioral:  Negative for confusion.     MEDICAL HISTORY:  Past Medical History:  Diagnosis Date   Age related osteoporosis    Anemia    Barrett's esophagus    Bradycardia    Cancer (HCC)    PROSTATE   Cataract    CKD (chronic kidney disease) stage 3, GFR 30-59 ml/min (HCC)    Colon polyps    DDD (degenerative disc disease), cervical    DDD (degenerative disc disease), lumbar    Femur fracture (HCC)    Right   Fundic gland polyposis of stomach    Fundic gland polyps of stomach, benign    Gastritis    GERD (gastroesophageal reflux disease)    Hematuria    Hemorrhage of rectum  and anus    History of colon polyps    Insomnia    Lumbago    Osteopenia of the elderly    Skin cancer    Sleep apnea     SURGICAL HISTORY: Past Surgical History:  Procedure Laterality Date   BAND HEMORRHOIDECTOMY     COLONOSCOPY     COLONOSCOPY WITH PROPOFOL N/A 05/06/2018   Procedure: COLONOSCOPY WITH PROPOFOL;  Surgeon: Lollie Sails, MD;  Location: [facility] ENDOSCOPY;  Service: Endoscopy;  Laterality: N/A;   COLONOSCOPY WITH PROPOFOL N/A 09/23/2018   Procedure: COLONOSCOPY WITH PROPOFOL;  Surgeon: Lollie Sails, MD;  Location: [facility] ENDOSCOPY;  Service: Endoscopy;  Laterality: N/A;   COLONOSCOPY WITH PROPOFOL N/A 03/30/2021   Procedure: COLONOSCOPY WITH PROPOFOL;  Surgeon: Toledo, Benay Pike, MD;  Location: [facility] ENDOSCOPY;  Service: Gastroenterology;  Laterality: N/A;   CYSTOSCOPY W/ URETERAL STENT PLACEMENT Right 03/14/2022   Procedure: CYSTOSCOPY WITH RETROGRADE PYELOGRAM/URETERAL STENT PLACEMENT;  Surgeon: Abbie Sons, MD;  Location: [facility] ORS;  Service: Urology;  Laterality: Right;   ESOPHAGOGASTRODUODENOSCOPY (EGD) WITH PROPOFOL N/A 10/03/2016   Procedure: ESOPHAGOGASTRODUODENOSCOPY (EGD) WITH PROPOFOL;  Surgeon: Lollie Sails, MD;  Location: [facility] ENDOSCOPY;  Service: Endoscopy;  Laterality: N/A;   EYE SURGERY     CATARACTS   EYELID SURGERY     FLEXIBLE SIGMOIDOSCOPY     FRACTURE SURGERY Right 2016   f[place]   FRACTURE SURGERY     ORIF DISTAL FEMUR FRACTURE     TONSILLECTOMY     TRANSURETHRAL RESECTION OF BLADDER TUMOR N/A 03/14/2022   Procedure: TRANSURETHRAL RESECTION OF BLADDER TUMOR (TURBT);  Surgeon: Abbie Sons, MD;  Location: [facility] ORS;  Service: Urology;  Laterality: N/A;    SOCIAL HISTORY: Social History   Socioeconomic History   Marital status: Married    Spouse name: Not on file   Number of children: Not on file   Years of education: Not on file   Highest education level: Not on file  Occupational History   Not on file  Tobacco Use    Smoking status: Former    Packs/day: 1.00    Years: 15.00    Pack years: 15.00    Types: Cigarettes    Quit date: 10/03/1974    Years since quitting: 47.5   Smokeless tobacco: Never  Vaping Use   Vaping Use: Never used  Substance and Sexual Activity   Alcohol use: Not Currently    Alcohol/week: 1.0 standard drink    Types: 1 Cans of beer per week    Comment: 1 beer every 2 weeks   Drug use: No   Sexual activity: Yes    Birth control/protection: None  Other Topics Concern   Not on file  Social History Narrative   ** Merged History Encounter **       Social Determinants of Health   Financial Resource Strain: Not on file  Food Insecurity: Not on file  Transportation Needs: Not on file  Physical Activity: Not on file  Stress: Not on file  Social Connections: Not on file  Intimate Partner Violence: Not on file    FAMILY HISTORY: Family History  Problem Relation Age of Onset   Breast cancer Mother     ALLERGIES:  is allergic to demerol [meperidine hcl], prolia [denosumab], demerol [meperidine], and nsaids.  MEDICATIONS:  Current Outpatient Medications  Medication Sig Dispense Refill   acyclovir (ZOVIRAX) 200 MG capsule Take 1 capsule (200 mg total) by mouth 2 (two) times daily. 60 capsule 3   aspirin EC 81 MG tablet Take 1 tablet (81 mg total) by mouth daily. Swallow whole. 30 tablet 11   Calcium Carbonate (CALCIUM 500 PO) Take 1 tablet by mouth daily.     Cholecalciferol (VITAMIN D3) 125 MCG (5000 UT) CAPS Take 5,000 Units by mouth daily.     dexamethasone (DECADRON) 4 MG tablet Take 5 tablets (20 mg total) by mouth once a week. 20 tablet 0   ferrous sulfate 325 (65 FE) MG tablet Take 325 mg by mouth daily.     gabapentin (NEURONTIN) 300 MG capsule Take 1 capsule (300 mg total) by mouth at bedtime. 30 capsule 0   HYDROcodone-acetaminophen (NORCO) 7.5-325 MG tablet 0.5-1 tab every 6 hours as needed for pain 8 tablet 0   ipratropium (ATROVENT) 0.03 % nasal spray Place 1  spray into both nostrils in the morning.     lenalidomide (REVLIMID) 5 MG capsule Take 1 capsule (5 mg total) by mouth See admin instructions. TAKE 1 CAPSULE BY MOUTH ONCE DAILY FOR 21 DAYS ON AND 7 DAYS OFF 21 capsule 0   losartan (COZAAR) 25 MG tablet Take 25 mg by mouth daily.     melatonin 5 MG TABS Take 5 mg by mouth at bedtime.     montelukast (SINGULAIR) 10 MG tablet Take 1 tablet (10 mg total) by mouth See admin instructions. Take 1 tablet the day prior to Daratumumab treatments, and take 1 tablet daily for 2 days after treatments 30 tablet 1   Multiple Vitamins-Minerals (MENS 50+ MULTIVITAMIN) TABS Take 1 tablet by mouth daily.     ondansetron (ZOFRAN) 8 MG tablet Take 1  tablet (8 mg total) by mouth 2 (two) times daily as needed (Nausea or vomiting). 30 tablet 1   traZODone (DESYREL) 50 MG tablet Take 25 mg by mouth at bedtime.     No current facility-administered medications for this visit.     PHYSICAL EXAMINATION: ECOG PERFORMANCE STATUS: 1 - Symptomatic but completely ambulatory Today's Vitals   04/25/22 0907  BP: 116/87  Pulse: 99  Resp: 18  Temp: (!) 96.8 F (36 C)  Weight: 146 lb 4.8 oz (66.4 kg)  PainSc: 0-No pain   Body mass index is 22.91 kg/m.   Physical Exam Constitutional:      General: He is not in acute distress. HENT:     Head: Normocephalic and atraumatic.  Eyes:     General: No scleral icterus. Cardiovascular:     Rate and Rhythm: Normal rate and regular rhythm.     Heart sounds: Normal heart sounds.  Pulmonary:     Effort: Pulmonary effort is normal. No respiratory distress.     Breath sounds: No wheezing.  Abdominal:     General: Bowel sounds are normal. There is no distension.     Palpations: Abdomen is soft.  Musculoskeletal:        General: No deformity. Normal range of motion.     Cervical back: Normal range of motion and neck supple.  Skin:    General: Skin is warm and dry.     Findings: No erythema or rash.  Neurological:      Mental Status: He is alert and oriented to person, place, and time. Mental status is at baseline.     Cranial Nerves: No cranial nerve deficit.     Coordination: Coordination normal.  Psychiatric:        Mood and Affect: Mood normal.     LABORATORY DATA:  I have reviewed the data as listed Lab Results  Component Value Date   WBC 4.4 04/25/2022   HGB 10.1 (L) 04/25/2022   HCT 30.4 (L) 04/25/2022   MCV 93.3 04/25/2022   PLT 161 04/25/2022   Recent Labs    04/10/22 1011 04/18/22 0849 04/25/22 0842  NA 135 137 136  K 4.7 4.3 4.1  CL 105 106 106  CO2 '26 24 23  ' GLUCOSE 125* 120* 98  BUN 47* 41* 43*  CREATININE 3.01* 2.98* 2.89*  CALCIUM 8.4* 8.2* 8.2*  GFRNONAA 20* 20* 21*  PROT 6.8 6.5 6.2*  ALBUMIN 3.0* 3.1* 3.2*  AST '16 19 19  ' ALT '14 16 15  ' ALKPHOS 67 67 63  BILITOT 0.5 0.5 0.8    Iron/TIBC/Ferritin/ %Sat    Component Value Date/Time   IRON 60 02/23/2022 1523   TIBC 238 (L) 02/23/2022 1523   FERRITIN 86 02/23/2022 1523   IRONPCTSAT 25 02/23/2022 1523       RADIOGRAPHIC STUDIES: I have personally reviewed the radiological images as listed and agreed with the findings in the report.  DG HIP UNILAT WITH PELVIS 2-3 VIEWS LEFT  Result Date: 04/19/2022 CLINICAL DATA:  Bilateral hip pain.  History of multiple myeloma. EXAM: DG HIP (WITH OR WITHOUT PELVIS) 2-3V LEFT; DG HIP (WITH OR WITHOUT PELVIS) 2-3V RIGHT COMPARISON:  PET-CT March 02, 2022 FINDINGS: There is no evidence of hip fracture or dislocation. Degenerative joint changes of bilateral hips with narrowed joint space and osteophyte formation are noted. Status post prior pinning of proximal right femur. No focal discrete lytic lesion is identified in the visualized bony structures. Right ureteral catheter is noted.  Bilateral pelvic phleboliths are noted. IMPRESSION: No acute fracture or dislocation. Degenerative joint changes of bilateral hips. No focal lesions identified in bilateral hips. Electronically Signed    By: Abelardo Diesel M.D.   On: 04/19/2022 13:35   DG HIP UNILAT WITH PELVIS 2-3 VIEWS RIGHT  Result Date: 04/19/2022 CLINICAL DATA:  Bilateral hip pain.  History of multiple myeloma. EXAM: DG HIP (WITH OR WITHOUT PELVIS) 2-3V LEFT; DG HIP (WITH OR WITHOUT PELVIS) 2-3V RIGHT COMPARISON:  PET-CT March 02, 2022 FINDINGS: There is no evidence of hip fracture or dislocation. Degenerative joint changes of bilateral hips with narrowed joint space and osteophyte formation are noted. Status post prior pinning of proximal right femur. No focal discrete lytic lesion is identified in the visualized bony structures. Right ureteral catheter is noted. Bilateral pelvic phleboliths are noted. IMPRESSION: No acute fracture or dislocation. Degenerative joint changes of bilateral hips. No focal lesions identified in bilateral hips. Electronically Signed   By: Abelardo Diesel M.D.   On: 04/19/2022 13:35     ASSESSMENT & PLAN:  1. Multiple myeloma not having achieved remission ([place])   2. Encounter for antineoplastic chemotherapy   3. 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 Anemia in stage 4 chronic kidney disease (HCC)   5. 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 no bone lesion, no hypercalcemia,+ anemia,+ impaired kidney function despite ureter  stent placement. 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 Labs reviewed and discussed patient Patient's kidney function slightly improving.  M protein level and light chain ratio are both improving. Recommend patient to proceed with cycle 2 Daratumumab-RVD- [Revlimid 109m 3 weeks on 1 week off]  Continue aspirin 81 mg daily for DVT prophylaxis. Acyclovir 2060mBID  #High-grade urothelial carcinoma, we will present case in the tumor board.  He will need distal ureterectomy.  [facility]  +/- Chemotherapy adjuvantly. Okay to hold aspirin prior to urology procedure [facility]  #Anxiety associated with cancer diagnosis.  Symptom is stable.  Patient previously declined anxiety medication.  #Anemia, multifactorial, from multiple myeloma, chronic kidney disease.  Stable hemoglobin. #Chronic kidney disease, stage IV.  Creatinine is improving.  Avoid nephrotoxins.  Encourage oral hydration.  #Bone health, currently he does not have an active myeloma bone involvement.  We discussed about future recommendation of bisphosphonate or denosumab treatments.  Patient reports history of developing adverse reaction after Prolia treatments. Hopefully kidney function improves and we may be able to initiate dose reduced Zometa in the future.  All questions were answered.The patient knows to call the clinic with any problems questions or concerns.  Cc JoBaxter HireMD  Return of visit:  5/30 or 5/31  lab Dara-velcade-dex  1 week after 5/30 or 5/31 lab Dara-velcade-dex  2 weeks after  5/30 or 5/31 lab Dara-velcade-dex  3 weeks after 5/30 or 5/31 lab MD Dara-velcade-dex    [name], MD, PhD 04/25/2022

## 2022-05-02 ENCOUNTER — Inpatient Hospital Stay (HOSPITAL_BASED_OUTPATIENT_CLINIC_OR_DEPARTMENT_OTHER): Payer: Medicare Other | Admitting: Hospice and Palliative Medicine

## 2022-05-02 ENCOUNTER — Inpatient Hospital Stay: Payer: Medicare Other

## 2022-05-02 VITALS — BP 133/41 | HR 45 | Temp 96.7°F | Resp 18 | Ht 67.0 in | Wt 150.0 lb

## 2022-05-02 DIAGNOSIS — R21 Rash and other nonspecific skin eruption: Secondary | ICD-10-CM | POA: Diagnosis not present

## 2022-05-02 DIAGNOSIS — C9000 Multiple myeloma not having achieved remission: Secondary | ICD-10-CM

## 2022-05-02 DIAGNOSIS — Z5112 Encounter for antineoplastic immunotherapy: Secondary | ICD-10-CM | POA: Diagnosis not present

## 2022-05-02 LAB — COMPREHENSIVE METABOLIC PANEL
ALT: 14 U/L (ref 0–44)
AST: 17 U/L (ref 15–41)
Albumin: 3.1 g/dL — ABNORMAL LOW (ref 3.5–5.0)
Alkaline Phosphatase: 62 U/L (ref 38–126)
Anion gap: 5 (ref 5–15)
BUN: 40 mg/dL — ABNORMAL HIGH (ref 8–23)
CO2: 27 mmol/L (ref 22–32)
Calcium: 8 mg/dL — ABNORMAL LOW (ref 8.9–10.3)
Chloride: 104 mmol/L (ref 98–111)
Creatinine, Ser: 3.05 mg/dL — ABNORMAL HIGH (ref 0.61–1.24)
GFR, Estimated: 20 mL/min — ABNORMAL LOW (ref >60)
Glucose, Bld: 94 mg/dL (ref 70–99)
Potassium: 4.3 mmol/L (ref 3.5–5.1)
Sodium: 136 mmol/L (ref 135–145)
Total Bilirubin: 0.5 mg/dL (ref 0.3–1.2)
Total Protein: 5.9 g/dL — ABNORMAL LOW (ref 6.5–8.1)

## 2022-05-02 LAB — CBC WITH DIFFERENTIAL/PLATELET
Abs Immature Granulocytes: 0.01 10*3/uL (ref 0.00–0.07)
Basophils Absolute: 0 10*3/uL (ref 0.0–0.1)
Basophils Relative: 1 %
Eosinophils Absolute: 0.5 10*3/uL (ref 0.0–0.5)
Eosinophils Relative: 12 %
HCT: 30.9 % — ABNORMAL LOW (ref 39.0–52.0)
Hemoglobin: 10.2 g/dL — ABNORMAL LOW (ref 13.0–17.0)
Immature Granulocytes: 0 %
Lymphocytes Relative: 13 %
Lymphs Abs: 0.5 10*3/uL — ABNORMAL LOW (ref 0.7–4.0)
MCH: 30.8 pg (ref 26.0–34.0)
MCHC: 33 g/dL (ref 30.0–36.0)
MCV: 93.4 fL (ref 80.0–100.0)
Monocytes Absolute: 0.5 10*3/uL (ref 0.1–1.0)
Monocytes Relative: 11 %
Neutro Abs: 2.6 10*3/uL (ref 1.7–7.7)
Neutrophils Relative %: 63 %
Platelets: 161 10*3/uL (ref 150–400)
RBC: 3.31 MIL/uL — ABNORMAL LOW (ref 4.22–5.81)
RDW: 14.5 % (ref 11.5–15.5)
WBC: 4.2 10*3/uL (ref 4.0–10.5)
nRBC: 0 % (ref 0.0–0.2)

## 2022-05-02 MED ORDER — ACETAMINOPHEN 325 MG PO TABS
650.0000 mg | ORAL_TABLET | Freq: Once | ORAL | Status: AC
  Administered 2022-05-02: 650 mg via ORAL
  Filled 2022-05-02: qty 2

## 2022-05-02 MED ORDER — DARATUMUMAB-HYALURONIDASE-FIHJ 1800-30000 MG-UT/15ML ~~LOC~~ SOLN
1800.0000 mg | Freq: Once | SUBCUTANEOUS | Status: AC
  Administered 2022-05-02: 1800 mg via SUBCUTANEOUS
  Filled 2022-05-02: qty 15

## 2022-05-02 MED ORDER — TRIAMCINOLONE ACETONIDE 0.5 % EX OINT: 1.0000 "application " | TOPICAL_OINTMENT | Freq: Two times a day (BID) | CUTANEOUS | 0 refills | Status: DC

## 2022-05-02 MED ORDER — BORTEZOMIB CHEMO SQ INJECTION 3.5 MG (2.5MG/ML)
1.3000 mg/m2 | Freq: Once | INTRAMUSCULAR | Status: AC
  Administered 2022-05-02: 2.25 mg via SUBCUTANEOUS
  Filled 2022-05-02: qty 0.9

## 2022-05-02 MED ORDER — SODIUM CHLORIDE 0.9 % IV SOLN
Freq: Once | INTRAVENOUS | Status: DC
  Filled 2022-05-02: qty 250

## 2022-05-02 MED ORDER — DEXAMETHASONE 4 MG PO TABS
20.0000 mg | ORAL_TABLET | Freq: Once | ORAL | Status: AC
  Administered 2022-05-02: 20 mg via ORAL
  Filled 2022-05-02: qty 5

## 2022-05-02 MED ORDER — DIPHENHYDRAMINE HCL 25 MG PO CAPS
50.0000 mg | ORAL_CAPSULE | Freq: Once | ORAL | Status: AC
  Administered 2022-05-02: 50 mg via ORAL
  Filled 2022-05-02: qty 2

## 2022-05-02 NOTE — Progress Notes (Signed)
Symptom Management [place] at [facility] [phone]) [phone] Fax:([phone]) [phone]  Patient Care Team: Baxter Hire, MD as PCP - General (Internal Medicine) Noreene Filbert, MD as Radiation Oncologist (Radiation Oncology) Baxter Hire, MD (Internal Medicine) Lavonia Dana, MD as Consulting Physician (Nephrology) [name], MD as Consulting Physician (Oncology)   Name of the patient: [name] [name]  [id number]  03-23-1941   Date of visit: 05/02/22  Reason for Consult: Cathleen Corti Coltrane is a 81 y.o. male with multiple medical problems including multiple myeloma and high-grade urothelial carcinoma.  Patient is on daratumumab/Velcade/Revlimid.  Plan is for distal ureterectomy..  Patient here in clinic for day 8, cycle 2 Velcade/daratumumab.  He was added on to [facility] schedule for evaluation of a rash.  Patient says that he developed a red, intensely pruritic rash when he started taking the Revlimid.  He feels like it is related to the Revlimid as the rash improves/resolves during the off week.  He denies fever or chills.  No other symptomatic complaints.  Patient says that the rash is in a patch on his lower abdomen and that he also has pruritic areas the left shoulder and right chest area.  He has been applying hydrocortisone cream and says that this significantly helps the itching.  The itching is worse at night.  Denies any neurologic complaints. Denies recent fevers or illnesses. Denies any easy bleeding or bruising. Reports good appetite and denies weight loss. Denies chest pain. Denies any nausea, vomiting, constipation, or diarrhea. Denies urinary complaints. Patient offers no further specific complaints today.    PAST MEDICAL HISTORY: Past Medical History:  Diagnosis Date   Age related osteoporosis    Anemia    Barrett's esophagus    Bradycardia    Cancer (HCC)    PROSTATE   Cataract    CKD (chronic kidney disease) stage 3, GFR 30-59 ml/min  (HCC)    Colon polyps    DDD (degenerative disc disease), cervical    DDD (degenerative disc disease), lumbar    Femur fracture (HCC)    Right   Fundic gland polyposis of stomach    Fundic gland polyps of stomach, benign    Gastritis    GERD (gastroesophageal reflux disease)    Hematuria    Hemorrhage of rectum and anus    History of colon polyps    Insomnia    Lumbago    Osteopenia of the elderly    Skin cancer    Sleep apnea     PAST SURGICAL HISTORY:  Past Surgical History:  Procedure Laterality Date   BAND HEMORRHOIDECTOMY     COLONOSCOPY     COLONOSCOPY WITH PROPOFOL N/A 05/06/2018   Procedure: COLONOSCOPY WITH PROPOFOL;  Surgeon: Lollie Sails, MD;  Location[facility] ENDOSCOPY;  Service: Endoscopy;  Laterality: N/A;   COLONOSCOPY WITH PROPOFOL N/A 09/23/2018   Procedure: COLONOSCOPY WITH PROPOFOL;  Surgeon: Lollie Sails, MD;  Location: [facility] ENDOSCOPY;  Service: Endoscopy;  Laterality: N/A;   COLONOSCOPY WITH PROPOFOL N/A 03/30/2021   Procedure: COLONOSCOPY WITH PROPOFOL;  Surgeon: Toledo, Benay Pike, MD;  Location: [facility] ENDOSCOPY;  Service: Gastroenterology;  Laterality: N/A;   CYSTOSCOPY W/ URETERAL STENT PLACEMENT Right 03/14/2022   Procedure: CYSTOSCOPY WITH RETROGRADE PYELOGRAM/URETERAL STENT PLACEMENT;  Surgeon: Abbie Sons, MD;  Location: [facility] ORS;  Service: Urology;  Laterality: Right;   ESOPHAGOGASTRODUODENOSCOPY (EGD) WITH PROPOFOL N/A 10/03/2016   Procedure: ESOPHAGOGASTRODUODENOSCOPY (EGD) WITH PROPOFOL;  Surgeon: Lollie Sails, MD;  Location:  [facility] ENDOSCOPY;  Service: Endoscopy;  Laterality: N/A;   EYE SURGERY     CATARACTS   EYELID SURGERY     FLEXIBLE SIGMOIDOSCOPY     FRACTURE SURGERY Right 2016   [facility]   FRACTURE SURGERY     ORIF DISTAL FEMUR FRACTURE     TONSILLECTOMY     TRANSURETHRAL RESECTION OF BLADDER TUMOR N/A 03/14/2022   Procedure: TRANSURETHRAL RESECTION OF BLADDER TUMOR (TURBT);  Surgeon: Abbie Sons, MD;  Location: [facility]  ORS;  Service: Urology;  Laterality: N/A;    HEMATOLOGY/ONCOLOGY HISTORY:  Oncology History  Multiple myeloma ([place])  03/17/2022 Initial Diagnosis   Multiple myeloma ([place])    03/17/2022 Cancer Staging   Staging form: Plasma Cell Myeloma and Plasma Cell Disorders, AJCC 8th Edition - Clinical stage from 03/17/2022: RISS Stage III (Beta-2-microglobulin (mg/L): 6.9, Albumin (g/dL): 3.3, ISS: Stage III, High-risk cytogenetics: Present, LDH: Normal) - Signed by [name], MD on 03/17/2022 Stage prefix: Initial diagnosis Beta 2 microglobulin range (mg/L): Greater than or equal to 5.5 Albumin range (g/dL): Less than 3.5 Cytogenetics: t(4;14) translocation, Other mutation, 1q addition    03/17/2022 - 03/17/2022 Chemotherapy   Patient is on Treatment Plan : MYELOMA NEWLY DIAGNOSED NON-TRANSPLANT CANDIDATE CyBorD q28d x 8 cycles      03/24/2022 -  Chemotherapy   Patient is on Treatment Plan : MYELOMA NEWLY DIAGNOSED Daratumumab + Dexamethasone Weekly (DaraRd) q28d      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  MEDICATIONS:  Current Outpatient Medications  Medication Sig Dispense Refill   acyclovir (ZOVIRAX) 200 MG capsule Take 1 capsule (200 mg total) by mouth 2 (two) times daily. 60 capsule 3   aspirin EC 81 MG tablet Take 1 tablet (81 mg total) by mouth daily. Swallow whole. 30 tablet 11   Calcium Carbonate (CALCIUM 500 PO) Take 1 tablet by mouth daily.     Cholecalciferol (VITAMIN D3) 125 MCG (5000 UT) CAPS Take 5,000 Units  by mouth daily.     dexamethasone (DECADRON) 4 MG tablet Take 5 tablets (20 mg total) by mouth once a week. 20 tablet 0   ferrous sulfate 325 (65 FE) MG tablet Take 325 mg by mouth daily.     gabapentin (NEURONTIN) 300 MG capsule Take 1 capsule (300 mg total) by mouth at bedtime. 30 capsule 0   HYDROcodone-acetaminophen (NORCO) 7.5-325 MG tablet 0.5-1 tab every 6 hours as needed for pain 8 tablet 0   ipratropium (ATROVENT) 0.03 % nasal spray Place 1 spray into both nostrils in the morning.     lenalidomide (REVLIMID) 5 MG capsule Take 1 capsule (5 mg total) by mouth See admin instructions. TAKE 1 CAPSULE BY MOUTH ONCE DAILY FOR 21 DAYS ON AND 7 DAYS OFF 21 capsule 0   losartan (COZAAR) 25 MG tablet Take 25 mg by mouth daily.     melatonin 5 MG TABS Take 5 mg by mouth at bedtime.     montelukast (SINGULAIR) 10 MG tablet Take 1 tablet (10 mg total) by mouth See admin instructions. Take  1 tablet the day prior to Daratumumab treatments, and take 1 tablet daily for 2 days after treatments 30 tablet 1   Multiple Vitamins-Minerals (MENS 50+ MULTIVITAMIN) TABS Take 1 tablet by mouth daily.     ondansetron (ZOFRAN) 8 MG tablet Take 1 tablet (8 mg total) by mouth 2 (two) times daily as needed (Nausea or vomiting). 30 tablet 1   traZODone (DESYREL) 50 MG tablet Take 25 mg by mouth at bedtime.     No current facility-administered medications for this visit.    VITAL SIGNS: There were no vitals taken for this visit. There were no vitals filed for this visit.  Estimated body mass index is 23.49 kg/m as calculated from the following:   Height as of an earlier encounter on 05/02/22: '5\' 7"'  (1.702 m).   Weight as of an earlier encounter on 05/02/22: 150 lb (68 kg).  LABS: CBC:    Component Value Date/Time   WBC 4.2 05/02/2022 0923   HGB 10.2 (L) 05/02/2022 0923   HGB 14.4 03/25/2015 0531   HCT 30.9 (L) 05/02/2022 0923   HCT 44.3 03/25/2015 0531   PLT 161 05/02/2022 0923   PLT 142 (L) 03/25/2015 0531    MCV 93.4 05/02/2022 0923   MCV 88 03/25/2015 0531   NEUTROABS 2.6 05/02/2022 0923   NEUTROABS 6.3 03/25/2015 0531   LYMPHSABS 0.5 (L) 05/02/2022 0923   LYMPHSABS 0.8 (L) 03/25/2015 0531   MONOABS 0.5 05/02/2022 0923   MONOABS 0.8 03/25/2015 0531   EOSABS 0.5 05/02/2022 0923   EOSABS 0.4 03/25/2015 0531   BASOSABS 0.0 05/02/2022 0923   BASOSABS 0.0 03/25/2015 0531   Comprehensive Metabolic Panel:    Component Value Date/Time   NA 136 04/25/2022 0842   NA 139 03/24/2015 0645   K 4.1 04/25/2022 0842   K 4.4 03/24/2015 0645   CL 106 04/25/2022 0842   CL 108 03/24/2015 0645   CO2 23 04/25/2022 0842   CO2 24 03/24/2015 0645   BUN 43 (H) 04/25/2022 0842   BUN 20 03/24/2015 0645   CREATININE 2.89 (H) 04/25/2022 0842   CREATININE 1.24 03/24/2015 0645   GLUCOSE 98 04/25/2022 0842   GLUCOSE 113 (H) 03/24/2015 0645   CALCIUM 8.2 (L) 04/25/2022 0842   CALCIUM 8.2 (L) 03/24/2015 0645   AST 19 04/25/2022 0842   AST 26 03/22/2015 1108   ALT 15 04/25/2022 0842   ALT 21 03/22/2015 1108   ALKPHOS 63 04/25/2022 0842   ALKPHOS 70 03/22/2015 1108   BILITOT 0.8 04/25/2022 0842   BILITOT 0.8 03/22/2015 1108   PROT 6.2 (L) 04/25/2022 0842   PROT 6.9 03/22/2015 1108   ALBUMIN 3.2 (L) 04/25/2022 0842   ALBUMIN 3.9 03/22/2015 1108    RADIOGRAPHIC STUDIES: DG HIP UNILAT WITH PELVIS 2-3 VIEWS LEFT  Result Date: 04/19/2022 CLINICAL DATA:  Bilateral hip pain.  History of multiple myeloma. EXAM: DG HIP (WITH OR WITHOUT PELVIS) 2-3V LEFT; DG HIP (WITH OR WITHOUT PELVIS) 2-3V RIGHT COMPARISON:  PET-CT March 02, 2022 FINDINGS: There is no evidence of hip fracture or dislocation. Degenerative joint changes of bilateral hips with narrowed joint space and osteophyte formation are noted. Status post prior pinning of proximal right femur. No focal discrete lytic lesion is identified in the visualized bony structures. Right ureteral catheter is noted. Bilateral pelvic phleboliths are noted. IMPRESSION: No  acute fracture or dislocation. Degenerative joint changes of bilateral hips. No focal lesions identified in bilateral hips. Electronically Signed   By: Abelardo Diesel  M.D.   On: 04/19/2022 13:35   DG HIP UNILAT WITH PELVIS 2-3 VIEWS RIGHT  Result Date: 04/19/2022 CLINICAL DATA:  Bilateral hip pain.  History of multiple myeloma. EXAM: DG HIP (WITH OR WITHOUT PELVIS) 2-3V LEFT; DG HIP (WITH OR WITHOUT PELVIS) 2-3V RIGHT COMPARISON:  PET-CT March 02, 2022 FINDINGS: There is no evidence of hip fracture or dislocation. Degenerative joint changes of bilateral hips with narrowed joint space and osteophyte formation are noted. Status post prior pinning of proximal right femur. No focal discrete lytic lesion is identified in the visualized bony structures. Right ureteral catheter is noted. Bilateral pelvic phleboliths are noted. IMPRESSION: No acute fracture or dislocation. Degenerative joint changes of bilateral hips. No focal lesions identified in bilateral hips. Electronically Signed   By: Abelardo Diesel M.D.   On: 04/19/2022 13:35    PERFORMANCE STATUS (ECOG) : 1 - Symptomatic but completely ambulatory  Review of Systems Unless otherwise noted, a complete review of systems is negative.  Physical Exam General: NAD Cardiovascular: regular rate and rhythm Pulmonary: clear ant fields Abdomen: soft, nontender, + bowel sounds GU: no suprapubic tenderness Extremities: no edema, no joint deformities Skin: Slight erythema/patch area to right lower abdomen Neurological: Weakness but otherwise nonfocal     Assessment and Plan- Patient is a 81 y.o. male with multiple medical problems including multiple myeloma and high-grade urothelial carcinoma.  Patient is on daratumumab/Velcade/Revlimid.  Plan is for distal ureterectomy..   Rash -likely drug-induced given history of timing and resolution during off week of medication.  Patient is having some improvement with hydrocortisone.  Will prescribe a higher potency  steroid cream.  Also recommended an antihistamine such as loratadine.  Discussed cool compresses if needed.  Patient will monitor and report any worsening of symptoms but he would prefer to continue current treatment regimen for now.   Patient expressed understanding and was in agreement with this plan. He also understands that He can call clinic at any time with any questions, concerns, or complaints.   Thank you for allowing me to participate in the care of this very pleasant patient.   Time Total: 15 minutes  Visit consisted of counseling and education dealing with the complex and emotionally intense issues of symptom management in the setting of serious illness.Greater than 50%  of this time was spent counseling and coordinating care related to the above assessment and plan.  Signed by: Altha Harm, PhD, NP-C

## 2022-05-02 NOTE — Patient Instructions (Signed)
[facility] CANCER CTR AT [place]  Discharge Instructions: Thank you for choosing [facility] to provide your oncology and hematology care.  If you have a lab appointment with the [facility], please go directly to the [place] and check in at the registration area.  Wear comfortable clothing and clothing appropriate for easy access to any Portacath or PICC line.   We strive to give you quality time with your provider. You may need to reschedule your appointment if you arrive late (15 or more minutes).  Arriving late affects you and other patients whose appointments are after yours.  Also, if you miss three or more appointments without notifying the office, you may be dismissed from the clinic at the provider's discretion.      For prescription refill requests, have your pharmacy contact our office and allow 72 hours for refills to be completed.    Today you received the following chemotherapy and/or immunotherapy agents VELCADE and DARAZALEX      To help prevent nausea and vomiting after your treatment, we encourage you to take your nausea medication as directed.  BELOW ARE SYMPTOMS THAT SHOULD BE REPORTED IMMEDIATELY: *FEVER GREATER THAN 100.4 F (38 C) OR HIGHER *CHILLS OR SWEATING *NAUSEA AND VOMITING THAT IS NOT CONTROLLED WITH YOUR NAUSEA MEDICATION *UNUSUAL SHORTNESS OF BREATH *UNUSUAL BRUISING OR BLEEDING *URINARY PROBLEMS (pain or burning when urinating, or frequent urination) *BOWEL PROBLEMS (unusual diarrhea, constipation, pain near the anus) TENDERNESS IN MOUTH AND THROAT WITH OR WITHOUT PRESENCE OF ULCERS (sore throat, sores in mouth, or a toothache) UNUSUAL RASH, SWELLING OR PAIN  UNUSUAL VAGINAL DISCHARGE OR ITCHING   Items with * indicate a potential emergency and should be followed up as soon as possible or go to the Emergency Department if any problems should occur.  Please show the CHEMOTHERAPY ALERT CARD or IMMUNOTHERAPY ALERT CARD at  check-in to the Emergency Department and triage nurse.  Should you have questions after your visit or need to cancel or reschedule your appointment, please contact [facility] CANCER [place] AT [place]  [phone] and follow the prompts.  Office hours are 8:00 a.m. to 4:30 p.m. Monday - Friday. Please note that voicemails left after 4:00 p.m. may not be returned until the following business day.  We are closed weekends and major holidays. You have access to a nurse at all times for urgent questions. Please call the main number to the clinic [phone] and follow the prompts.  For any non-urgent questions, you may also contact your provider using MyChart. We now offer e-Visits for anyone 53 and older to request care online for non-urgent symptoms. For details visit [email or web address].   Also download the MyChart app! Go to the app store, search "MyChart", open the app, select [place], and log in with your MyChart username and password.  Due to Covid, a mask is required upon entering the hospital/clinic. If you do not have a mask, one will be given to you upon arrival. For doctor visits, patients may have 1 support person aged 48 or older with them. For treatment visits, patients cannot have anyone with them due to current Covid guidelines and our immunocompromised population.   Bortezomib injection What is this medication? BORTEZOMIB (bor TEZ oh mib) targets proteins in cancer cells and stops the cancer cells from growing. It treats multiple myeloma and mantle cell lymphoma. This medicine may be used for other purposes; ask your health care provider or pharmacist if you have questions. COMMON BRAND NAME(S):  Velcade What should I tell my care team before I take this medication? They need to know if you have any of these conditions: dehydration diabetes (high blood sugar) heart disease liver disease tingling of the fingers or toes or other nerve disorder an unusual or allergic  reaction to bortezomib, mannitol, boron, other medicines, foods, dyes, or preservatives pregnant or trying to get pregnant breast-feeding How should I use this medication? This medicine is injected into a vein or under the skin. It is given by a health care provider in a hospital or clinic setting. Talk to your health care provider about the use of this medicine in children. Special care may be needed. Overdosage: If you think you have taken too much of this medicine contact a poison control center or emergency room at once. NOTE: This medicine is only for you. Do not share this medicine with others. What if I miss a dose? Keep appointments for follow-up doses. It is important not to miss your dose. Call your health care provider if you are unable to keep an appointment. What may interact with this medication? This medicine may interact with the following medications: ketoconazole rifampin This list may not describe all possible interactions. Give your health care provider a list of all the medicines, herbs, non-prescription drugs, or dietary supplements you use. Also tell them if you smoke, drink alcohol, or use illegal drugs. Some items may interact with your medicine. What should I watch for while using this medication? Your condition will be monitored carefully while you are receiving this medicine. You may need blood work done while you are taking this medicine. You may get drowsy or dizzy. Do not drive, use machinery, or do anything that needs mental alertness until you know how this medicine affects you. Do not stand up or sit up quickly, especially if you are an older patient. This reduces the risk of dizzy or fainting spells This medicine may increase your risk of getting an infection. Call your health care provider for advice if you get a fever, chills, sore throat, or other symptoms of a cold or flu. Do not treat yourself. Try to avoid being around people who are sick. Check with your  health care provider if you have severe diarrhea, nausea, and vomiting, or if you sweat a lot. The loss of too much body fluid may make it dangerous for you to take this medicine. Do not become pregnant while taking this medicine or for 7 months after stopping it. Women should inform their health care provider if they wish to become pregnant or think they might be pregnant. Men should not father a child while taking this medicine and for 4 months after stopping it. There is a potential for serious harm to an unborn child. Talk to your health care provider for more information. Do not breast-feed an infant while taking this medicine or for 2 months after stopping it. This medicine may make it more difficult to get pregnant or father a child. Talk to your health care provider if you are concerned about your fertility. What side effects may I notice from receiving this medication? Side effects that you should report to your doctor or health care professional as soon as possible: allergic reactions (skin rash; itching or hives; swelling of the face, lips, or tongue) bleeding (bloody or black, tarry stools; red or dark brown urine; spitting up blood or brown material that looks like coffee grounds; red spots on the skin; unusual bruising or bleeding from  the eye, gums, or nose) blurred vision or changes in vision confusion constipation headache heart failure (trouble breathing; fast, irregular heartbeat; sudden weight gain; swelling of the ankles, feet, hands) infection (fever, chills, cough, sore throat, pain or trouble passing urine) lack or loss of appetite liver injury (dark yellow or brown urine; general ill feeling or flu-like symptoms; loss of appetite, right upper belly pain; yellowing of the eyes or skin) low blood pressure (dizziness; feeling faint or lightheaded, falls; unusually weak or tired) muscle cramps pain, redness, or irritation at site where injected pain, tingling, numbness in the  hands or feet seizures trouble breathing unusual bruising or bleeding Side effects that usually do not require medical attention (report to your doctor or health care professional if they continue or are bothersome): diarrhea nausea stomach pain trouble sleeping vomiting This list may not describe all possible side effects. Call your doctor for medical advice about side effects. You may report side effects to FDA at 1-800-FDA-1088. Where should I keep my medication? This medicine is given in a hospital or clinic. It will not be stored at home. NOTE: This sheet is a summary. It may not cover all possible information. If you have questions about this medicine, talk to your doctor, pharmacist, or health care provider.  2023 Elsevier/Gold Standard (2020-11-11 00:00:00) Daratumumab; Hyaluronidase Injection Quel est ce mdicament ? L'association DARATUMUMAB/HYALURONIDASE est un anticorps monoclonal. La hyaluronidase est utilise pour [employer] daratumumab. Il est indiqu dans le traitement de certains types de cancer, Parmi les cancers traits figurent le mylome multiple et l'amylose  chane lgre Ce mdicament peut tre utilis pour d'autres indications ; adressez-vous  votre mdecin ou  votre pharmacien si vous [employer] questions. NOM(S) DE MARQUE COURANT(S) : DARZALEX FASPRO Que dois-je dire  mon fournisseur de soins avant de prendre ce mdicament ? Ils ont besoin de savoir si vous prsentez l'une quelconque des affections ou situations suivantes : maladie cardiaque infection, en particulier une infection virale, telle que la varicelle, le bouton de fivre, l'herps ou l'hpatite B maladie pulmonaire ou respiratoire raction inhabituelle ou allergique au daratumumab ou  la hyaluronidase raction inhabituelle ou allergique  d'autres mdicaments, aliments, colorants ou conservateurs vous tes enceinte ou cherchez  tre enceinte vous allaitez Comment dois-je utiliser ce  mdicament ? Ce mdicament est destin  tre inject sous la peau. Ce mdicament est administr par un professionnel de sant dans un hpital ou [employer]. Interrogez votre pdiatre quant  l'utilisation de ce mdicament [employer]. Des prcautions particulires Electrical engineer. Surdosage : si vous pensez avoir pris ce mdicament en excs, contactez immdiatement un centre [employer]. REMARQUE : ce mdicament vous est uniquement destin. Ne partagez pas ce Location manager. Que faire si je saute une dose ? Prsentez-vous  tous les rendez-vous pour les rappels de vaccination conformment aux instructions. Il est important de ne pas sauter de dose. Contactez votre mdecin ou votre fournisseur de soins si vous ne pouvez vous rendre  Engineer, drilling. Quelles sont les interactions possibles avec ce mdicament ? Les interactions n'ont pas t tudies. Cette liste peut ne pas dcrire toutes les interactions mdicamenteuses possibles. Donnez  votre fournisseur de [employer]ogg de tous les mdicaments, plantes mdicinales, mdicaments en vente libre ou complments alimentaires que vous prenez. Dites-lui aussi si vous fumez, buvez de l'alcool ou consommez des drogues illicites. Certaines substances peuvent interagir Physiological scientist. Que dois-je Museum/gallery exhibitions officer par ce mdicament ?  Vous serez troitement surveill(e) Radio broadcast assistant par [employer]. Ce mdicament peut provoquer de graves ractions allergiques. Pour rduire Geneticist, molecular, votre mdecin ou votre fournisseur de soins peut vous donner d'autres mdicaments  prendre avant de recevoir celui-ci. Veillez  suivre les instructions de votre mdecin ou de votre fournisseur de [place]. Ce mdicament peut influencer les rsultats d'analyse de sang visant  dterminer votre groupe sanguin. Cette influence peut persister jusqu' 6 mois aprs la dernire dose. Votre  mdecin ou votre professionnel de sant effectuera les analyses de sang pour dterminer votre groupe sanguin Biomedical engineer. Avant de recevoir une transfusion sanguine, avertissez tous vos mdecins ou professionnels de sant que vous suivez un traitement par [employer]. Ce mdicament peut influencer les rsultats des tests utiliss pour dterminer la rponse de Statistician. Des tests supplmentaires peuvent alors tre ncessaires pour [employer] rponse. Vous ne devez pas tomber Sports administrator par ce mdicament ni au cours des 3 mois suivant l'arrt du [place]. Les Training and development officer fournisseur de soins si elles souhaitent avoir un enfant ou si elles pensent tre enceintes. Il existe un risque d'effets secondaires graves pour [employer]. Pour plus d'informations, parlez-en  votre mdecin ou  votre fournisseur de soins. N'allaitez Biomedical engineer par ce [employer]. Quels effets secondaires puis-je remarquer en prenant ce mdicament ? Effets secondaires que vous devez signaler  votre quipe de soins ds que possible : Ractions allergiques : ruption cutane, dmangeaisons ou urticaire, gonflement du visage, des lvres ou de la langue Caillot sanguin : douleurs dans la poitrine, essoufflement, jambe douloureuse, enfle ou chaude Vision trouble Battements cardiaques rapides et irrguliers Infection : fivre, frissons, toux, mal de gorge, mission d'urine douloureuse ou difficile Raction  l'injection : vertiges, rythme cardiaque rapide, sensation d'vanouissement ou d'tourdissement, chutes, mal de tte, augmentation de la pression artrielle, nauses, vomissements ou respiration sifflante ou difficult  respirer accompagne de bruits respiratoires forts ou sifflants Faible numration des globules rouges : difficults  respirer, sensation d'vanouissement, sensation d'tourdissement ou chutes, faiblesse ou fatigue  inhabituelle Saignements ou ecchymoses (bleus) inhabituels Effets secondaires ne ncessitant gnralement pas un avis mdical (signalez-les  votre quipe de soins s'ils persistent ou sont gnants) : Douleurs au dos Constipation Diarrhe Douleur, fourmillements, insensibilit dans les mains ou les pieds Douleur, rougeur ou irritation au site d?injection Crampe ou douleur musculaire Gonflement des [place], des pieds et des mains Fatigue Troubles du sommeil Cette liste peut ne pas dcrire tous les effets secondaires possibles. Appelez votre mdecin pour [employer] sujet des [employer]. Vous pouvez signaler les effets secondaires  la FDA, au [phone]. O dois-je conserver mon mdicament ? Ce mdicament est administr par un professionnel de sant dans un hpital ou [employer]. Vous ne recevrez pas ce mdicament pour [employer] vous. REMARQUE : cette notice est un rsum. Elle peut ne pas contenir toutes les informations possibles. Si vous avez des questions  propos de ce mdicament, Secretary/administrator mdecin, votre pharmacien ou votre fournisseur de [place].  2023 Elsevier/Gold Standard (2021-06-24 00:00:00)

## 2022-05-09 ENCOUNTER — Other Ambulatory Visit: Payer: Self-pay | Admitting: Emergency Medicine

## 2022-05-09 ENCOUNTER — Inpatient Hospital Stay: Payer: Medicare Other

## 2022-05-09 ENCOUNTER — Other Ambulatory Visit: Payer: Medicare Other

## 2022-05-09 ENCOUNTER — Ambulatory Visit: Payer: Medicare Other

## 2022-05-09 ENCOUNTER — Inpatient Hospital Stay: Payer: Medicare Other | Attending: Oncology

## 2022-05-09 ENCOUNTER — Ambulatory Visit: Payer: Medicare Other | Admitting: Oncology

## 2022-05-09 VITALS — BP 116/53 | HR 48 | Temp 96.8°F | Resp 18 | Ht 67.0 in | Wt 149.6 lb

## 2022-05-09 DIAGNOSIS — C9000 Multiple myeloma not having achieved remission: Secondary | ICD-10-CM

## 2022-05-09 DIAGNOSIS — Z7952 Long term (current) use of systemic steroids: Secondary | ICD-10-CM | POA: Insufficient documentation

## 2022-05-09 DIAGNOSIS — G4700 Insomnia, unspecified: Secondary | ICD-10-CM | POA: Insufficient documentation

## 2022-05-09 DIAGNOSIS — Z803 Family history of malignant neoplasm of breast: Secondary | ICD-10-CM | POA: Insufficient documentation

## 2022-05-09 DIAGNOSIS — Z7961 Long term (current) use of immunomodulator: Secondary | ICD-10-CM | POA: Insufficient documentation

## 2022-05-09 DIAGNOSIS — K219 Gastro-esophageal reflux disease without esophagitis: Secondary | ICD-10-CM | POA: Insufficient documentation

## 2022-05-09 DIAGNOSIS — D631 Anemia in chronic kidney disease: Secondary | ICD-10-CM | POA: Insufficient documentation

## 2022-05-09 DIAGNOSIS — C679 Malignant neoplasm of bladder, unspecified: Secondary | ICD-10-CM | POA: Diagnosis not present

## 2022-05-09 DIAGNOSIS — Z79899 Other long term (current) drug therapy: Secondary | ICD-10-CM | POA: Insufficient documentation

## 2022-05-09 DIAGNOSIS — M810 Age-related osteoporosis without current pathological fracture: Secondary | ICD-10-CM | POA: Diagnosis not present

## 2022-05-09 DIAGNOSIS — Z7982 Long term (current) use of aspirin: Secondary | ICD-10-CM | POA: Insufficient documentation

## 2022-05-09 DIAGNOSIS — Z87891 Personal history of nicotine dependence: Secondary | ICD-10-CM | POA: Insufficient documentation

## 2022-05-09 DIAGNOSIS — Z5112 Encounter for antineoplastic immunotherapy: Secondary | ICD-10-CM | POA: Diagnosis present

## 2022-05-09 DIAGNOSIS — N183 Chronic kidney disease, stage 3 unspecified: Secondary | ICD-10-CM | POA: Diagnosis not present

## 2022-05-09 DIAGNOSIS — I129 Hypertensive chronic kidney disease with stage 1 through stage 4 chronic kidney disease, or unspecified chronic kidney disease: Secondary | ICD-10-CM | POA: Diagnosis not present

## 2022-05-09 DIAGNOSIS — R059 Cough, unspecified: Secondary | ICD-10-CM | POA: Insufficient documentation

## 2022-05-09 LAB — CBC WITH DIFFERENTIAL/PLATELET
Abs Immature Granulocytes: 0.01 10*3/uL (ref 0.00–0.07)
Basophils Absolute: 0 10*3/uL (ref 0.0–0.1)
Basophils Relative: 0 %
Eosinophils Absolute: 0.4 10*3/uL (ref 0.0–0.5)
Eosinophils Relative: 8 %
HCT: 31.3 % — ABNORMAL LOW (ref 39.0–52.0)
Hemoglobin: 10.3 g/dL — ABNORMAL LOW (ref 13.0–17.0)
Immature Granulocytes: 0 %
Lymphocytes Relative: 12 %
Lymphs Abs: 0.6 10*3/uL — ABNORMAL LOW (ref 0.7–4.0)
MCH: 31.1 pg (ref 26.0–34.0)
MCHC: 32.9 g/dL (ref 30.0–36.0)
MCV: 94.6 fL (ref 80.0–100.0)
Monocytes Absolute: 1 10*3/uL (ref 0.1–1.0)
Monocytes Relative: 18 %
Neutro Abs: 3.3 10*3/uL (ref 1.7–7.7)
Neutrophils Relative %: 62 %
Platelets: 150 10*3/uL (ref 150–400)
RBC: 3.31 MIL/uL — ABNORMAL LOW (ref 4.22–5.81)
RDW: 14.3 % (ref 11.5–15.5)
WBC: 5.4 10*3/uL (ref 4.0–10.5)
nRBC: 0 % (ref 0.0–0.2)

## 2022-05-09 LAB — COMPREHENSIVE METABOLIC PANEL
ALT: 16 U/L (ref 0–44)
AST: 19 U/L (ref 15–41)
Albumin: 3.3 g/dL — ABNORMAL LOW (ref 3.5–5.0)
Alkaline Phosphatase: 72 U/L (ref 38–126)
Anion gap: 9 (ref 5–15)
BUN: 39 mg/dL — ABNORMAL HIGH (ref 8–23)
CO2: 26 mmol/L (ref 22–32)
Calcium: 8.6 mg/dL — ABNORMAL LOW (ref 8.9–10.3)
Chloride: 105 mmol/L (ref 98–111)
Creatinine, Ser: 2.76 mg/dL — ABNORMAL HIGH (ref 0.61–1.24)
GFR, Estimated: 22 mL/min — ABNORMAL LOW (ref >60)
Glucose, Bld: 96 mg/dL (ref 70–99)
Potassium: 4 mmol/L (ref 3.5–5.1)
Sodium: 140 mmol/L (ref 135–145)
Total Bilirubin: 0.3 mg/dL (ref 0.3–1.2)
Total Protein: 5.9 g/dL — ABNORMAL LOW (ref 6.5–8.1)

## 2022-05-09 MED ORDER — ACETAMINOPHEN 325 MG PO TABS
650.0000 mg | ORAL_TABLET | Freq: Once | ORAL | Status: AC
  Administered 2022-05-09: 650 mg via ORAL
  Filled 2022-05-09: qty 2

## 2022-05-09 MED ORDER — DIPHENHYDRAMINE HCL 25 MG PO CAPS
50.0000 mg | ORAL_CAPSULE | Freq: Once | ORAL | Status: AC
  Administered 2022-05-09: 50 mg via ORAL
  Filled 2022-05-09: qty 2

## 2022-05-09 MED ORDER — DEXAMETHASONE 4 MG PO TABS
20.0000 mg | ORAL_TABLET | Freq: Once | ORAL | Status: AC
  Administered 2022-05-09: 20 mg via ORAL
  Filled 2022-05-09: qty 5

## 2022-05-09 MED ORDER — BORTEZOMIB CHEMO SQ INJECTION 3.5 MG (2.5MG/ML)
1.3000 mg/m2 | Freq: Once | INTRAMUSCULAR | Status: AC
  Administered 2022-05-09: 2.25 mg via SUBCUTANEOUS
  Filled 2022-05-09: qty 0.9

## 2022-05-09 MED ORDER — DARATUMUMAB-HYALURONIDASE-FIHJ 1800-30000 MG-UT/15ML ~~LOC~~ SOLN
1800.0000 mg | Freq: Once | SUBCUTANEOUS | Status: AC
  Administered 2022-05-09: 1800 mg via SUBCUTANEOUS
  Filled 2022-05-09: qty 15

## 2022-05-09 NOTE — Patient Instructions (Signed)
[facility] CANCER CTR AT [place]  Discharge Instructions: Thank you for choosing [facility] to provide your oncology and hematology care.  If you have a lab appointment with the [facility], please go directly to the [place] and check in at the registration area.  Wear comfortable clothing and clothing appropriate for easy access to any Portacath or PICC line.   We strive to give you quality time with your provider. You may need to reschedule your appointment if you arrive late (15 or more minutes).  Arriving late affects you and other patients whose appointments are after yours.  Also, if you miss three or more appointments without notifying the office, you may be dismissed from the clinic at the provider's discretion.      For prescription refill requests, have your pharmacy contact our office and allow 72 hours for refills to be completed.    Today you received the following chemotherapy and/or immunotherapy agents Darzalex & Velcade      To help prevent nausea and vomiting after your treatment, we encourage you to take your nausea medication as directed.  BELOW ARE SYMPTOMS THAT SHOULD BE REPORTED IMMEDIATELY: *FEVER GREATER THAN 100.4 F (38 C) OR HIGHER *CHILLS OR SWEATING *NAUSEA AND VOMITING THAT IS NOT CONTROLLED WITH YOUR NAUSEA MEDICATION *UNUSUAL SHORTNESS OF BREATH *UNUSUAL BRUISING OR BLEEDING *URINARY PROBLEMS (pain or burning when urinating, or frequent urination) *BOWEL PROBLEMS (unusual diarrhea, constipation, pain near the anus) TENDERNESS IN MOUTH AND THROAT WITH OR WITHOUT PRESENCE OF ULCERS (sore throat, sores in mouth, or a toothache) UNUSUAL RASH, SWELLING OR PAIN  UNUSUAL VAGINAL DISCHARGE OR ITCHING   Items with * indicate a potential emergency and should be followed up as soon as possible or go to the Emergency Department if any problems should occur.  Please show the CHEMOTHERAPY ALERT CARD or IMMUNOTHERAPY ALERT CARD at  check-in to the Emergency Department and triage nurse.  Should you have questions after your visit or need to cancel or reschedule your appointment, please contact [facility] CANCER [place] AT [place]  [phone] and follow the prompts.  Office hours are 8:00 a.m. to 4:30 p.m. Monday - Friday. Please note that voicemails left after 4:00 p.m. may not be returned until the following business day.  We are closed weekends and major holidays. You have access to a nurse at all times for urgent questions. Please call the main number to the clinic [phone] and follow the prompts.  For any non-urgent questions, you may also contact your provider using MyChart. We now offer e-Visits for anyone 81 and older to request care online for non-urgent symptoms. For details visit [email or web address].   Also download the MyChart app! Go to the app store, search "MyChart", open the app, select [place], and log in with your MyChart username and password.  Due to Covid, a mask is required upon entering the hospital/clinic. If you do not have a mask, one will be given to you upon arrival. For doctor visits, patients may have 1 support person aged 41 or older with them. For treatment visits, patients cannot have anyone with them due to current Covid guidelines and our immunocompromised population.

## 2022-05-12 ENCOUNTER — Encounter: Payer: Self-pay | Admitting: Oncology

## 2022-05-12 MED ORDER — LENALIDOMIDE 5 MG PO CAPS: 5.0000 mg | ORAL_CAPSULE | ORAL | 0 refills | Status: DC

## 2022-05-15 ENCOUNTER — Telehealth: Payer: Self-pay | Admitting: *Deleted

## 2022-05-15 ENCOUNTER — Other Ambulatory Visit: Payer: Self-pay | Admitting: *Deleted

## 2022-05-15 ENCOUNTER — Ambulatory Visit
Admission: RE | Admit: 2022-05-15 | Discharge: 2022-05-15 | Disposition: A | Discharge status: Home / Self Care | Payer: Medicare Other | Source: Ambulatory Visit | Attending: Nurse Practitioner | Admitting: Nurse Practitioner

## 2022-05-15 DIAGNOSIS — R059 Cough, unspecified: Secondary | ICD-10-CM | POA: Diagnosis present

## 2022-05-15 DIAGNOSIS — C9000 Multiple myeloma not having achieved remission: Secondary | ICD-10-CM

## 2022-05-15 DIAGNOSIS — R062 Wheezing: Secondary | ICD-10-CM | POA: Diagnosis present

## 2022-05-15 MED ORDER — LENALIDOMIDE 5 MG PO CAPS: 5.0000 mg | ORAL_CAPSULE | ORAL | 0 refills | Status: DC

## 2022-05-15 NOTE — Telephone Encounter (Signed)
Pt will need to see NP first, most likely he will not need tx but may need IVF. Will let NP decide tomorrow. Please update appt.

## 2022-05-15 NOTE — Telephone Encounter (Signed)
Patient called stating that he feels he has a respiratory infection. When asked to describe symptoms, he reports that he is coughing, but can't get anything up, feels it is in his chest and is wheezing since yesterday. He feels he cannot get a full breath in. He denies fever. He also reports that since a procedure at [facility] last week when he was intubated, his throat has not felt normal. He is scheduled for treatment and lab tomorrow and is asking what needs to be done for this. Lease advise

## 2022-05-16 ENCOUNTER — Other Ambulatory Visit: Payer: Self-pay

## 2022-05-16 ENCOUNTER — Telehealth: Payer: Self-pay | Admitting: *Deleted

## 2022-05-16 ENCOUNTER — Encounter: Payer: Medicare Other | Admitting: Hospice and Palliative Medicine

## 2022-05-16 ENCOUNTER — Inpatient Hospital Stay: Payer: Medicare Other

## 2022-05-16 ENCOUNTER — Inpatient Hospital Stay (HOSPITAL_BASED_OUTPATIENT_CLINIC_OR_DEPARTMENT_OTHER): Payer: Medicare Other | Admitting: Nurse Practitioner

## 2022-05-16 VITALS — BP 113/40 | HR 40

## 2022-05-16 VITALS — BP 121/40 | HR 98 | Temp 96.2°F | Ht 69.0 in | Wt 147.1 lb

## 2022-05-16 DIAGNOSIS — C9000 Multiple myeloma not having achieved remission: Secondary | ICD-10-CM | POA: Diagnosis not present

## 2022-05-16 DIAGNOSIS — Z5112 Encounter for antineoplastic immunotherapy: Secondary | ICD-10-CM | POA: Diagnosis not present

## 2022-05-16 DIAGNOSIS — J189 Pneumonia, unspecified organism: Secondary | ICD-10-CM | POA: Diagnosis not present

## 2022-05-16 DIAGNOSIS — R059 Cough, unspecified: Secondary | ICD-10-CM | POA: Diagnosis not present

## 2022-05-16 LAB — COMPREHENSIVE METABOLIC PANEL
ALT: 17 U/L (ref 0–44)
AST: 19 U/L (ref 15–41)
Albumin: 3.2 g/dL — ABNORMAL LOW (ref 3.5–5.0)
Alkaline Phosphatase: 70 U/L (ref 38–126)
Anion gap: 5 (ref 5–15)
BUN: 43 mg/dL — ABNORMAL HIGH (ref 8–23)
CO2: 25 mmol/L (ref 22–32)
Calcium: 8.2 mg/dL — ABNORMAL LOW (ref 8.9–10.3)
Chloride: 108 mmol/L (ref 98–111)
Creatinine, Ser: 2.87 mg/dL — ABNORMAL HIGH (ref 0.61–1.24)
GFR, Estimated: 21 mL/min — ABNORMAL LOW (ref >60)
Glucose, Bld: 103 mg/dL — ABNORMAL HIGH (ref 70–99)
Potassium: 4.2 mmol/L (ref 3.5–5.1)
Sodium: 138 mmol/L (ref 135–145)
Total Bilirubin: 0.5 mg/dL (ref 0.3–1.2)
Total Protein: 6 g/dL — ABNORMAL LOW (ref 6.5–8.1)

## 2022-05-16 LAB — CBC WITH DIFFERENTIAL/PLATELET
Abs Immature Granulocytes: 0.03 10*3/uL (ref 0.00–0.07)
Basophils Absolute: 0 10*3/uL (ref 0.0–0.1)
Basophils Relative: 1 %
Eosinophils Absolute: 0.9 10*3/uL — ABNORMAL HIGH (ref 0.0–0.5)
Eosinophils Relative: 22 %
HCT: 30.8 % — ABNORMAL LOW (ref 39.0–52.0)
Hemoglobin: 10 g/dL — ABNORMAL LOW (ref 13.0–17.0)
Immature Granulocytes: 1 %
Lymphocytes Relative: 13 %
Lymphs Abs: 0.5 10*3/uL — ABNORMAL LOW (ref 0.7–4.0)
MCH: 31.3 pg (ref 26.0–34.0)
MCHC: 32.5 g/dL (ref 30.0–36.0)
MCV: 96.3 fL (ref 80.0–100.0)
Monocytes Absolute: 0.6 10*3/uL (ref 0.1–1.0)
Monocytes Relative: 15 %
Neutro Abs: 2 10*3/uL (ref 1.7–7.7)
Neutrophils Relative %: 48 %
Platelets: 144 10*3/uL — ABNORMAL LOW (ref 150–400)
RBC: 3.2 MIL/uL — ABNORMAL LOW (ref 4.22–5.81)
RDW: 14.2 % (ref 11.5–15.5)
WBC: 3.9 10*3/uL — ABNORMAL LOW (ref 4.0–10.5)
nRBC: 0 % (ref 0.0–0.2)

## 2022-05-16 MED ORDER — DIPHENHYDRAMINE HCL 25 MG PO CAPS
50.0000 mg | ORAL_CAPSULE | Freq: Once | ORAL | Status: AC
  Administered 2022-05-16: 50 mg via ORAL
  Filled 2022-05-16: qty 2

## 2022-05-16 MED ORDER — DARATUMUMAB-HYALURONIDASE-FIHJ 1800-30000 MG-UT/15ML ~~LOC~~ SOLN
1800.0000 mg | Freq: Once | SUBCUTANEOUS | Status: AC
  Administered 2022-05-16: 1800 mg via SUBCUTANEOUS
  Filled 2022-05-16: qty 15

## 2022-05-16 MED ORDER — ACETAMINOPHEN 325 MG PO TABS
650.0000 mg | ORAL_TABLET | Freq: Once | ORAL | Status: AC
  Administered 2022-05-16: 650 mg via ORAL
  Filled 2022-05-16: qty 2

## 2022-05-16 MED ORDER — BORTEZOMIB CHEMO SQ INJECTION 3.5 MG (2.5MG/ML)
1.3000 mg/m2 | Freq: Once | INTRAMUSCULAR | Status: AC
  Administered 2022-05-16: 2.25 mg via SUBCUTANEOUS
  Filled 2022-05-16: qty 0.9

## 2022-05-16 MED ORDER — DEXAMETHASONE 4 MG PO TABS
20.0000 mg | ORAL_TABLET | Freq: Once | ORAL | Status: AC
  Administered 2022-05-16: 20 mg via ORAL
  Filled 2022-05-16: qty 5

## 2022-05-16 MED ORDER — LEVOFLOXACIN 250 MG PO TABS: ORAL_TABLET | ORAL | 0 refills | Status: DC

## 2022-05-16 NOTE — Patient Instructions (Signed)
[facility] CANCER CTR AT [place]  Discharge Instructions: Thank you for choosing [facility] to provide your oncology and hematology care.  If you have a lab appointment with the [facility], please go directly to the [facility] and check in at the registration area.  Wear comfortable clothing and clothing appropriate for easy access to any Portacath or PICC line.   We strive to give you quality time with your provider. You may need to reschedule your appointment if you arrive late (15 or more minutes).  Arriving late affects you and other patients whose appointments are after yours.  Also, if you miss three or more appointments without notifying the office, you may be dismissed from the clinic at the provider's discretion.      For prescription refill requests, have your pharmacy contact our office and allow 72 hours for refills to be completed.    Today you received the following chemotherapy and/or immunotherapy agents: Darzalex Faspro, Velcade      To help prevent nausea and vomiting after your treatment, we encourage you to take your nausea medication as directed.  BELOW ARE SYMPTOMS THAT SHOULD BE REPORTED IMMEDIATELY: *FEVER GREATER THAN 100.4 F (38 C) OR HIGHER *CHILLS OR SWEATING *NAUSEA AND VOMITING THAT IS NOT CONTROLLED WITH YOUR NAUSEA MEDICATION *UNUSUAL SHORTNESS OF BREATH *UNUSUAL BRUISING OR BLEEDING *URINARY PROBLEMS (pain or burning when urinating, or frequent urination) *BOWEL PROBLEMS (unusual diarrhea, constipation, pain near the anus) TENDERNESS IN MOUTH AND THROAT WITH OR WITHOUT PRESENCE OF ULCERS (sore throat, sores in mouth, or a toothache) UNUSUAL RASH, SWELLING OR PAIN  UNUSUAL VAGINAL DISCHARGE OR ITCHING   Items with * indicate a potential emergency and should be followed up as soon as possible or go to the Emergency Department if any problems should occur.  Please show the CHEMOTHERAPY ALERT CARD or IMMUNOTHERAPY ALERT CARD  at check-in to the Emergency Department and triage nurse.  Should you have questions after your visit or need to cancel or reschedule your appointment, please contact [facility] CANCER [place] AT [place]  [phone] and follow the prompts.  Office hours are 8:00 a.m. to 4:30 p.m. Monday - Friday. Please note that voicemails left after 4:00 p.m. may not be returned until the following business day.  We are closed weekends and major holidays. You have access to a nurse at all times for urgent questions. Please call the main number to the clinic [phone] and follow the prompts.  For any non-urgent questions, you may also contact your provider using MyChart. We now offer e-Visits for anyone 69 and older to request care online for non-urgent symptoms. For details visit [email or web address].   Also download the MyChart app! Go to the app store, search "MyChart", open the app, select [place], and log in with your MyChart username and password.  Due to Covid, a mask is required upon entering the hospital/clinic. If you do not have a mask, one will be given to you upon arrival. For doctor visits, patients may have 1 support person aged 10 or older with them. For treatment visits, patients cannot have anyone with them due to current Covid guidelines and our immunocompromised population.

## 2022-05-16 NOTE — Telephone Encounter (Signed)
Called patient per request of Beckey Rutter, NP and reviewed xray that is showed beginning signs of pneumonia.  Explained that she had called in a prescription for antibiotics to his pharmacy.  Patient states he will pick up the prescription.

## 2022-05-16 NOTE — Progress Notes (Signed)
Patient with complaints of difficulty taking a deep breath Sunday and Monday - some coughing.  States little amount of clear sputum.  States he still has a sore throat since his procedure the first of this month when he was intubated.  Needs his Revlimid refilled.  Beckey Rutter, NP informed.

## 2022-05-16 NOTE — Progress Notes (Addendum)
Symptom Management [place] [facility] [place]. [facility] [address] [place], [place] [address] [phone] (phone) [phone] (fax)  Patient Care Team: Baxter Hire, MD as PCP - General (Internal Medicine) Noreene Filbert, MD as Radiation Oncologist (Radiation Oncology) Baxter Hire, MD (Internal Medicine) Lavonia Dana, MD as Consulting Physician (Nephrology) [name], MD as Consulting Physician (Oncology)   Name of the patient: [name] [name]  [id number]  1941-03-15   Date of visit: 05/16/22  Diagnosis-multiple myeloma  Chief complaint/ Reason for visit-cough  Heme/Onc history:  Oncology History  Multiple myeloma ([place])  03/17/2022 Initial Diagnosis   Multiple myeloma ([place])   03/17/2022 Cancer Staging   Staging form: Plasma Cell Myeloma and Plasma Cell Disorders, AJCC 8th Edition - Clinical stage from 03/17/2022: RISS Stage III (Beta-2-microglobulin (mg/L): 6.9, Albumin (g/dL): 3.3, ISS: Stage III, High-risk cytogenetics: Present, LDH: Normal) - Signed by [name], MD on 03/17/2022 Stage prefix: Initial diagnosis Beta 2 microglobulin range (mg/L): Greater than or equal to 5.5 Albumin range (g/dL): Less than 3.5 Cytogenetics: t(4;14) translocation, Other mutation, 1q addition   03/17/2022 - 03/17/2022 Chemotherapy   Patient is on Treatment Plan : MYELOMA NEWLY DIAGNOSED NON-TRANSPLANT CANDIDATE CyBorD q28d x 8 cycles     03/24/2022 -  Chemotherapy   Patient is on Treatment Plan : MYELOMA NEWLY DIAGNOSED Daratumumab + Dexamethasone Weekly (DaraRd) q28d     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  He underwent a surgical procedure last week and was intubated.  2 to 3 days later he complained of dry cough that gradually worsened for a couple of days.  Today, it seems to be improving and nearly resolved.  He nearly feels back to baseline.  No fevers or chills.  Cough is minimally productive. Has not taken any medications for his symptoms.  ECOG FS:1 - Symptomatic but completely ambulatory  Review of systems- Review of Systems  Constitutional:  Negative for chills, fever, malaise/fatigue and weight loss.  HENT:  Negative for hearing loss, nosebleeds, sore throat and tinnitus.   Eyes:  Negative for blurred vision and double vision.  Respiratory:  Positive for cough. Negative for hemoptysis, shortness of breath and wheezing.   Cardiovascular:  Negative for chest pain, palpitations and leg swelling.  Gastrointestinal:  Negative for abdominal pain, blood in stool, constipation, diarrhea, melena, nausea and vomiting.  Genitourinary:  Negative for dysuria and urgency.  Musculoskeletal:  Negative for back pain, falls, joint pain and myalgias.  Skin:  Negative for itching and rash.  Neurological:  Negative for dizziness, tingling, sensory change, loss of consciousness, weakness and headaches.  Endo/Heme/Allergies:  Negative for environmental allergies. Does not bruise/bleed easily.  Psychiatric/Behavioral:  Negative for depression. The patient is not nervous/anxious and does not have insomnia.       Allergies  Allergen Reactions   Demerol [Meperidine Hcl] Other (See Comments)    "I pass out"   Prolia [Denosumab] Other (See Comments)  Pain and vision loss. Pt requesting to add as an allergy.    Demerol [Meperidine] Nausea And Vomiting and Other (See Comments)     Patient passed out.   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   Cataract    CKD (chronic kidney disease) stage 3, GFR 30-59 ml/min (HCC)    Colon polyps    DDD (degenerative disc disease), cervical    DDD (degenerative disc disease), lumbar    Femur fracture (HCC)    Right   Fundic gland polyposis of stomach    Fundic gland polyps of stomach, benign    Gastritis    GERD (gastroesophageal reflux disease)    Hematuria    Hemorrhage of rectum and anus    History of colon polyps    Insomnia    Lumbago    Osteopenia of the elderly    Skin cancer    Sleep apnea     Past Surgical History:  Procedure Laterality Date   BAND HEMORRHOIDECTOMY     COLONOSCOPY     COLONOSCOPY WITH PROPOFOL N/A 05/06/2018   Procedure: COLONOSCOPY WITH PROPOFOL;  Surgeon: Lollie Sails, MD;  Location: [facility] ENDOSCOPY;  Service: Endoscopy;  Laterality: N/A;   COLONOSCOPY WITH PROPOFOL N/A 09/23/2018   Procedure: COLONOSCOPY WITH PROPOFOL;  Surgeon: Lollie Sails, MD;  Location: [facility] ENDOSCOPY;  Service: Endoscopy;  Laterality: N/A;   COLONOSCOPY WITH PROPOFOL N/A 03/30/2021   Procedure: COLONOSCOPY WITH PROPOFOL;  Surgeon: Toledo, Benay Pike, MD;  Location: [facility] ENDOSCOPY;  Service: Gastroenterology;  Laterality: N/A;   CYSTOSCOPY W/ URETERAL STENT PLACEMENT Right 03/14/2022   Procedure: CYSTOSCOPY WITH RETROGRADE PYELOGRAM/URETERAL STENT PLACEMENT;  Surgeon: Abbie Sons, MD;  Location: [facility] ORS;  Service: Urology;  Laterality: Right;   ESOPHAGOGASTRODUODENOSCOPY (EGD) WITH PROPOFOL N/A 10/03/2016   Procedure: ESOPHAGOGASTRODUODENOSCOPY (EGD) WITH PROPOFOL;  Surgeon: Lollie Sails, MD;  Location: [facility] ENDOSCOPY;  Service: Endoscopy;   Laterality: N/A;   EYE SURGERY     CATARACTS   EYELID SURGERY     FLEXIBLE SIGMOIDOSCOPY     FRACTURE SURGERY Right 2016   [facility]   FRACTURE SURGERY     ORIF DISTAL FEMUR FRACTURE     TONSILLECTOMY     TRANSURETHRAL RESECTION OF BLADDER TUMOR N/A 03/14/2022   Procedure: TRANSURETHRAL RESECTION OF BLADDER TUMOR (TURBT);  Surgeon: Abbie Sons, MD;  Location: [facility] ORS;  Service: Urology;  Laterality: N/A;    Social History   Socioeconomic History   Marital status: Married    Spouse name: Not on file   Number of children: Not on file   Years of education: Not on file   Highest education level: Not on file  Occupational History   Not on file  Tobacco Use   Smoking status: Former    Packs/day: 1.00    Years: 15.00    Total pack years: 15.00    Types: Cigarettes    Quit date: 10/03/1974    Years since quitting: 47.6   Smokeless tobacco: Never  Vaping Use   Vaping Use: Never used  Substance and Sexual Activity   Alcohol use: Not Currently    Alcohol/week: 1.0 standard drink of  alcohol    Types: 1 Cans of beer per week    Comment: 1 beer every 2 weeks   Drug use: No   Sexual activity: Yes    Birth control/protection: None  Other Topics Concern   Not on file  Social History Narrative   ** Merged History Encounter **       Social Determinants of Health   Financial Resource Strain: Not on file  Food Insecurity: Not on file  Transportation Needs: Not on file  Physical Activity: Not on file  Stress: Not on file  Social Connections: Not on file  Intimate Partner Violence: Not on file    Family History  Problem Relation Age of Onset   Breast cancer Mother      Current Outpatient Medications:    acyclovir (ZOVIRAX) 200 MG capsule, Take 1 capsule (200 mg total) by mouth 2 (two) times daily., Disp: 60 capsule, Rfl: 3   aspirin EC 81 MG tablet, Take 1 tablet (81 mg total) by mouth daily. Swallow whole., Disp: 30 tablet, Rfl: 11   Calcium Carbonate (CALCIUM 500 PO),  Take 1 tablet by mouth daily., Disp: , Rfl:    Cholecalciferol (VITAMIN D3) 125 MCG (5000 UT) CAPS, Take 5,000 Units by mouth daily., Disp: , Rfl:    dexamethasone (DECADRON) 4 MG tablet, Take 5 tablets (20 mg total) by mouth once a week., Disp: 20 tablet, Rfl: 0   ferrous sulfate 325 (65 FE) MG tablet, Take 325 mg by mouth daily., Disp: , Rfl:    gabapentin (NEURONTIN) 300 MG capsule, Take 1 capsule (300 mg total) by mouth at bedtime., Disp: 30 capsule, Rfl: 0   HYDROcodone-acetaminophen (NORCO) 7.5-325 MG tablet, 0.5-1 tab every 6 hours as needed for pain, Disp: 8 tablet, Rfl: 0   ipratropium (ATROVENT) 0.03 % nasal spray, Place 1 spray into both nostrils in the morning., Disp: , Rfl:    lenalidomide (REVLIMID) 5 MG capsule, Take 1 capsule (5 mg total) by mouth See admin instructions. TAKE 1 CAPSULE BY MOUTH ONCE DAILY FOR 21 DAYS ON AND 7 DAYS OFF, Disp: 21 capsule, Rfl: 0   losartan (COZAAR) 25 MG tablet, Take 25 mg by mouth daily., Disp: , Rfl:    melatonin 5 MG TABS, Take 5 mg by mouth at bedtime., Disp: , Rfl:    montelukast (SINGULAIR) 10 MG tablet, Take 1 tablet (10 mg total) by mouth See admin instructions. Take 1 tablet the day prior to Daratumumab treatments, and take 1 tablet daily for 2 days after treatments, Disp: 30 tablet, Rfl: 1   Multiple Vitamins-Minerals (MENS 50+ MULTIVITAMIN) TABS, Take 1 tablet by mouth daily., Disp: , Rfl:    ondansetron (ZOFRAN) 8 MG tablet, Take 1 tablet (8 mg total) by mouth 2 (two) times daily as needed (Nausea or vomiting)., Disp: 30 tablet, Rfl: 1   traZODone (DESYREL) 50 MG tablet, Take 25 mg by mouth at bedtime., Disp: , Rfl:    triamcinolone ointment (KENALOG) 0.5 %, Apply 1 application. topically 2 (two) times daily., Disp: 30 g, Rfl: 0  Physical exam:  Vitals:   05/16/22 0850  BP: (!) 121/40  Pulse: 98  Temp: (!) 96.2 F (35.7 C)  TempSrc: Tympanic  Weight: 147 lb 1.6 oz (66.7 kg)  Height: _0  (1.753 m)   Physical Exam Constitutional:       Appearance: He is not ill-appearing.  HENT:     Nose: No congestion or rhinorrhea.     Mouth/Throat:  Pharynx: No oropharyngeal exudate or posterior oropharyngeal erythema.  Cardiovascular:     Rate and Rhythm: Normal rate and regular rhythm.  Pulmonary:     Effort: No respiratory distress.     Breath sounds: Rhonchi (mild rhonchi bilateral upper lobes.) present. No wheezing.  Abdominal:     General: There is no distension.     Tenderness: There is no abdominal tenderness.  Musculoskeletal:        General: No deformity or signs of injury.  Skin:    General: Skin is warm and dry.  Neurological:     Mental Status: He is alert and oriented to person, place, and time.  Psychiatric:        Mood and Affect: Mood normal.        Behavior: Behavior normal.         Latest Ref Rng & Units 05/16/2022    7:54 AM  CMP  Glucose 70 - 99 mg/dL 103   BUN 8 - 23 mg/dL 43   Creatinine 0.61 - 1.24 mg/dL 2.87   Sodium 135 - 145 mmol/L 138   Potassium 3.5 - 5.1 mmol/L 4.2   Chloride 98 - 111 mmol/L 108   CO2 22 - 32 mmol/L 25   Calcium 8.9 - 10.3 mg/dL 8.2   Total Protein 6.5 - 8.1 g/dL 6.0   Total Bilirubin 0.3 - 1.2 mg/dL 0.5   Alkaline Phos 38 - 126 U/L 70   AST 15 - 41 U/L 19   ALT 0 - 44 U/L 17       Latest Ref Rng & Units 05/16/2022    7:54 AM  CBC  WBC 4.0 - 10.5 K/uL 3.9   Hemoglobin 13.0 - 17.0 g/dL 10.0   Hematocrit 39.0 - 52.0 % 30.8   Platelets 150 - 400 K/uL 144     No images are attached to the encounter.  DG HIP UNILAT WITH PELVIS 2-3 VIEWS LEFT  Result Date: 04/19/2022 CLINICAL DATA:  Bilateral hip pain.  History of multiple myeloma. EXAM: DG HIP (WITH OR WITHOUT PELVIS) 2-3V LEFT; DG HIP (WITH OR WITHOUT PELVIS) 2-3V RIGHT COMPARISON:  PET-CT March 02, 2022 FINDINGS: There is no evidence of hip fracture or dislocation. Degenerative joint changes of bilateral hips with narrowed joint space and osteophyte formation are noted. Status post prior pinning of  proximal right femur. No focal discrete lytic lesion is identified in the visualized bony structures. Right ureteral catheter is noted. Bilateral pelvic phleboliths are noted. IMPRESSION: No acute fracture or dislocation. Degenerative joint changes of bilateral hips. No focal lesions identified in bilateral hips. Electronically Signed   By: Abelardo Diesel M.D.   On: 04/19/2022 13:35   DG HIP UNILAT WITH PELVIS 2-3 VIEWS RIGHT  Result Date: 04/19/2022 CLINICAL DATA:  Bilateral hip pain.  History of multiple myeloma. EXAM: DG HIP (WITH OR WITHOUT PELVIS) 2-3V LEFT; DG HIP (WITH OR WITHOUT PELVIS) 2-3V RIGHT COMPARISON:  PET-CT March 02, 2022 FINDINGS: There is no evidence of hip fracture or dislocation. Degenerative joint changes of bilateral hips with narrowed joint space and osteophyte formation are noted. Status post prior pinning of proximal right femur. No focal discrete lytic lesion is identified in the visualized bony structures. Right ureteral catheter is noted. Bilateral pelvic phleboliths are noted. IMPRESSION: No acute fracture or dislocation. Degenerative joint changes of bilateral hips. No focal lesions identified in bilateral hips. Electronically Signed   By: Abelardo Diesel M.D.   On: 04/19/2022 13:35  Assessment and plan- Patient is a 81 y.o. male diagnosed with multiple myeloma who presents to symptom management clinic for concerns of cough.  Noninfectious appearing and overall reassuring.  Suspect reactive versus inflammatory secondary to recent intubation.  Chest x-ray concerning for developing pneumonia. Mild rhonchi bilateral upper lobes on exam. Labs reviewed and no leukocytosis.  Afebrile.  Well-appearing.  Given renal dysfunction, start levaquin 500 mg on day 1 followed by 250 mg daily x 6 days. He can continue with treatment as prescribed.  If symptoms worsen, recommend reevaluation.  He has not received refill of Revlimid and day 1 is next week.  I to Bethena Roys in oral pharmacy who will  follow-up.  Return to clinic if symptoms do not improve or worsen.  Otherwise, follow-up with Dr. Tasia Catchings as scheduled.   Visit Diagnosis 1. Community acquired pneumonia, unspecified laterality   2. Cough, unspecified type   3. Multiple myeloma, remission status unspecified ([place])    Patient expressed understanding and was in agreement with this plan. He also understands that He can call clinic at any time with any questions, concerns, or complaints.   Thank you for allowing me to participate in the care of this very pleasant patient.   Beckey Rutter, DNP, AGNP-C [facility]

## 2022-05-16 NOTE — Addendum Note (Signed)
Addended by: Verlon Au on: 05/16/2022 03:01 PM   Modules accepted: Orders, Level of Service

## 2022-05-17 ENCOUNTER — Telehealth: Payer: Self-pay | Admitting: *Deleted

## 2022-05-17 ENCOUNTER — Encounter: Payer: Self-pay | Admitting: Nurse Practitioner

## 2022-05-17 LAB — KAPPA/LAMBDA LIGHT CHAINS
Kappa free light chain: 165.3 mg/L — ABNORMAL HIGH (ref 3.3–19.4)
Kappa, lambda light chain ratio: 12.81 — ABNORMAL HIGH (ref 0.26–1.65)
Lambda free light chains: 12.9 mg/L (ref 5.7–26.3)

## 2022-05-17 NOTE — Telephone Encounter (Signed)
Patient called reporting that he started a new antibiotic yesterday that he has never had before and that he has developed a rash on his left anterior chest 3 inches in diameter which protruding very dark purple vein distention which is concerning to him. He is going to attempt to send a picture of it via My Chart if he can figure it out. Please advise

## 2022-05-17 NOTE — Telephone Encounter (Signed)
Pt in clinic. Will see Ander Purpura

## 2022-05-17 NOTE — Progress Notes (Signed)
Patient presented to clinic concerned of rash, possibly related to levaquin. He received both velcade and daratumumab yesterday in abdomen but possibly injections were lower than site of reaction.     I suspect this is an atypical reaction to velcade and not related to levaquin. Reviewed with Dr. Tasia Catchings who agreed. Plan to continue levaquin. If symptoms worsen, he will notify clinic.

## 2022-05-22 LAB — MULTIPLE MYELOMA PANEL, SERUM
Albumin SerPl Elph-Mcnc: 3.2 g/dL (ref 2.9–4.4)
Albumin/Glob SerPl: 1.5 (ref 0.7–1.7)
Alpha 1: 0.2 g/dL (ref 0.0–0.4)
Alpha2 Glob SerPl Elph-Mcnc: 0.6 g/dL (ref 0.4–1.0)
B-Globulin SerPl Elph-Mcnc: 1 g/dL (ref 0.7–1.3)
Gamma Glob SerPl Elph-Mcnc: 0.4 g/dL (ref 0.4–1.8)
Globulin, Total: 2.2 g/dL (ref 2.2–3.9)
IgA: 739 mg/dL — ABNORMAL HIGH (ref 61–437)
IgG (Immunoglobin G), Serum: 392 mg/dL — ABNORMAL LOW (ref 603–1613)
IgM (Immunoglobulin M), Srm: 35 mg/dL (ref 15–143)
M Protein SerPl Elph-Mcnc: 0.6 g/dL — ABNORMAL HIGH
Total Protein ELP: 5.4 g/dL — ABNORMAL LOW (ref 6.0–8.5)

## 2022-05-23 ENCOUNTER — Inpatient Hospital Stay: Payer: Medicare Other

## 2022-05-23 ENCOUNTER — Other Ambulatory Visit: Payer: Self-pay

## 2022-05-23 ENCOUNTER — Inpatient Hospital Stay (HOSPITAL_BASED_OUTPATIENT_CLINIC_OR_DEPARTMENT_OTHER): Payer: Medicare Other | Admitting: Oncology

## 2022-05-23 ENCOUNTER — Encounter: Payer: Self-pay | Admitting: Oncology

## 2022-05-23 DIAGNOSIS — C669 Malignant neoplasm of unspecified ureter: Secondary | ICD-10-CM | POA: Diagnosis not present

## 2022-05-23 DIAGNOSIS — C9000 Multiple myeloma not having achieved remission: Secondary | ICD-10-CM

## 2022-05-23 DIAGNOSIS — Z5111 Encounter for antineoplastic chemotherapy: Secondary | ICD-10-CM | POA: Diagnosis not present

## 2022-05-23 DIAGNOSIS — D631 Anemia in chronic kidney disease: Secondary | ICD-10-CM

## 2022-05-23 DIAGNOSIS — N184 Chronic kidney disease, stage 4 (severe): Secondary | ICD-10-CM | POA: Diagnosis not present

## 2022-05-23 DIAGNOSIS — Z5112 Encounter for antineoplastic immunotherapy: Secondary | ICD-10-CM | POA: Diagnosis not present

## 2022-05-23 LAB — COMPREHENSIVE METABOLIC PANEL
ALT: 16 U/L (ref 0–44)
AST: 20 U/L (ref 15–41)
Albumin: 3.1 g/dL — ABNORMAL LOW (ref 3.5–5.0)
Alkaline Phosphatase: 73 U/L (ref 38–126)
Anion gap: 9 (ref 5–15)
BUN: 41 mg/dL — ABNORMAL HIGH (ref 8–23)
CO2: 24 mmol/L (ref 22–32)
Calcium: 8.9 mg/dL (ref 8.9–10.3)
Chloride: 106 mmol/L (ref 98–111)
Creatinine, Ser: 2.75 mg/dL — ABNORMAL HIGH (ref 0.61–1.24)
GFR, Estimated: 22 mL/min — ABNORMAL LOW (ref >60)
Glucose, Bld: 109 mg/dL — ABNORMAL HIGH (ref 70–99)
Potassium: 4 mmol/L (ref 3.5–5.1)
Sodium: 139 mmol/L (ref 135–145)
Total Bilirubin: 0.4 mg/dL (ref 0.3–1.2)
Total Protein: 5.8 g/dL — ABNORMAL LOW (ref 6.5–8.1)

## 2022-05-23 LAB — CBC WITH DIFFERENTIAL/PLATELET
Abs Immature Granulocytes: 0.01 10*3/uL (ref 0.00–0.07)
Basophils Absolute: 0.1 10*3/uL (ref 0.0–0.1)
Basophils Relative: 1 %
Eosinophils Absolute: 0.4 10*3/uL (ref 0.0–0.5)
Eosinophils Relative: 10 %
HCT: 32.4 % — ABNORMAL LOW (ref 39.0–52.0)
Hemoglobin: 10.5 g/dL — ABNORMAL LOW (ref 13.0–17.0)
Immature Granulocytes: 0 %
Lymphocytes Relative: 14 %
Lymphs Abs: 0.6 10*3/uL — ABNORMAL LOW (ref 0.7–4.0)
MCH: 31.2 pg (ref 26.0–34.0)
MCHC: 32.4 g/dL (ref 30.0–36.0)
MCV: 96.1 fL (ref 80.0–100.0)
Monocytes Absolute: 0.7 10*3/uL (ref 0.1–1.0)
Monocytes Relative: 17 %
Neutro Abs: 2.5 10*3/uL (ref 1.7–7.7)
Neutrophils Relative %: 58 %
Platelets: 175 10*3/uL (ref 150–400)
RBC: 3.37 MIL/uL — ABNORMAL LOW (ref 4.22–5.81)
RDW: 14.6 % (ref 11.5–15.5)
WBC: 4.2 10*3/uL (ref 4.0–10.5)
nRBC: 0 % (ref 0.0–0.2)

## 2022-05-23 MED ORDER — BORTEZOMIB CHEMO SQ INJECTION 3.5 MG (2.5MG/ML)
1.3000 mg/m2 | Freq: Once | INTRAMUSCULAR | Status: AC
  Administered 2022-05-23: 2.25 mg via SUBCUTANEOUS
  Filled 2022-05-23: qty 0.9

## 2022-05-23 MED ORDER — DIPHENHYDRAMINE HCL 25 MG PO CAPS
50.0000 mg | ORAL_CAPSULE | Freq: Once | ORAL | Status: AC
  Administered 2022-05-23: 50 mg via ORAL
  Filled 2022-05-23: qty 2

## 2022-05-23 MED ORDER — DARATUMUMAB-HYALURONIDASE-FIHJ 1800-30000 MG-UT/15ML ~~LOC~~ SOLN
1800.0000 mg | Freq: Once | SUBCUTANEOUS | Status: AC
  Administered 2022-05-23: 1800 mg via SUBCUTANEOUS
  Filled 2022-05-23: qty 15

## 2022-05-23 MED ORDER — DEXAMETHASONE 4 MG PO TABS
20.0000 mg | ORAL_TABLET | Freq: Once | ORAL | Status: AC
  Administered 2022-05-23: 20 mg via ORAL
  Filled 2022-05-23: qty 5

## 2022-05-23 MED ORDER — ACETAMINOPHEN 325 MG PO TABS
650.0000 mg | ORAL_TABLET | Freq: Once | ORAL | Status: AC
  Administered 2022-05-23: 650 mg via ORAL
  Filled 2022-05-23: qty 2

## 2022-05-23 NOTE — Assessment & Plan Note (Signed)
Kidney function slightly improved. Avoid nephrotoxins.  Encourage oral hydration.  

## 2022-05-23 NOTE — Patient Instructions (Signed)
[facility] AT [place]  Discharge Instructions: Thank you for choosing [facility] to provide your oncology and hematology care.  If you have a lab appointment with the [facility], please go directly to the [facility] and check in at the registration area.  Wear comfortable clothing and clothing appropriate for easy access to any Portacath or PICC line.   We strive to give you quality time with your provider. You may need to reschedule your appointment if you arrive late (15 or more minutes).  Arriving late affects you and other patients whose appointments are after yours.  Also, if you miss three or more appointments without notifying the office, you may be dismissed from the clinic at the provider's discretion.      For prescription refill requests, have your pharmacy contact our office and allow 72 hours for refills to be completed.    Today you received the following chemotherapy and/or immunotherapy agents VELCADE and DARZALEX      To help prevent nausea and vomiting after your treatment, we encourage you to take your nausea medication as directed.  BELOW ARE SYMPTOMS THAT SHOULD BE REPORTED IMMEDIATELY: *FEVER GREATER THAN 100.4 F (38 C) OR HIGHER *CHILLS OR SWEATING *NAUSEA AND VOMITING THAT IS NOT CONTROLLED WITH YOUR NAUSEA MEDICATION *UNUSUAL SHORTNESS OF BREATH *UNUSUAL BRUISING OR BLEEDING *URINARY PROBLEMS (pain or burning when urinating, or frequent urination) *BOWEL PROBLEMS (unusual diarrhea, constipation, pain near the anus) TENDERNESS IN MOUTH AND THROAT WITH OR WITHOUT PRESENCE OF ULCERS (sore throat, sores in mouth, or a toothache) UNUSUAL RASH, SWELLING OR PAIN  UNUSUAL VAGINAL DISCHARGE OR ITCHING   Items with * indicate a potential emergency and should be followed up as soon as possible or go to the Emergency Department if any problems should occur.  Please show the CHEMOTHERAPY ALERT CARD or IMMUNOTHERAPY ALERT CARD at  check-in to the Emergency Department and triage nurse.  Should you have questions after your visit or need to cancel or reschedule your appointment, please contact [facility] [place] [facility] [place]  [phone] and follow the prompts.  Office hours are 8:00 a.m. to 4:30 p.m. Monday - Friday. Please note that voicemails left after 4:00 p.m. may not be returned until the following business day.  We are closed weekends and major holidays. You have access to a nurse at all times for urgent questions. Please call the main number to the clinic [phone] and follow the prompts.  For any non-urgent questions, you may also contact your provider using MyChart. We now offer e-Visits for anyone 72 and older to request care online for non-urgent symptoms. For details visit [email or web address].   Also download the MyChart app! Go to the app store, search "MyChart", open the app, select [facility], and log in with your MyChart username and password.  Masks are optional in the cancer centers. If you would like for your care team to wear a mask while they are taking care of you, please let them know. For doctor visits, patients may have with them one support person who is at least 83 years old. At this time, visitors are not allowed in the infusion area.   Bortezomib injection What is this medication? BORTEZOMIB (bor TEZ oh mib) targets proteins in cancer cells and stops the cancer cells from growing. It treats multiple myeloma and mantle cell lymphoma. This medicine may be used for other purposes; ask your health care provider or pharmacist if you have questions. COMMON BRAND NAME(S):  Velcade What should I tell my care team before I take this medication? They need to know if you have any of these conditions: dehydration diabetes (high blood sugar) heart disease liver disease tingling of the fingers or toes or other nerve disorder an unusual or allergic reaction to bortezomib, mannitol,  boron, other medicines, foods, dyes, or preservatives pregnant or trying to get pregnant breast-feeding How should I use this medication? This medicine is injected into a vein or under the skin. It is given by a health care provider in a hospital or clinic setting. Talk to your health care provider about the use of this medicine in children. Special care may be needed. Overdosage: If you think you have taken too much of this medicine contact a poison control center or emergency room at once. NOTE: This medicine is only for you. Do not share this medicine with others. What if I miss a dose? Keep appointments for follow-up doses. It is important not to miss your dose. Call your health care provider if you are unable to keep an appointment. What may interact with this medication? This medicine may interact with the following medications: ketoconazole rifampin This list may not describe all possible interactions. Give your health care provider a list of all the medicines, herbs, non-prescription drugs, or dietary supplements you use. Also tell them if you smoke, drink alcohol, or use illegal drugs. Some items may interact with your medicine. What should I watch for while using this medication? Your condition will be monitored carefully while you are receiving this medicine. You may need blood work done while you are taking this medicine. You may get drowsy or dizzy. Do not drive, use machinery, or do anything that needs mental alertness until you know how this medicine affects you. Do not stand up or sit up quickly, especially if you are an older patient. This reduces the risk of dizzy or fainting spells This medicine may increase your risk of getting an infection. Call your health care provider for advice if you get a fever, chills, sore throat, or other symptoms of a cold or flu. Do not treat yourself. Try to avoid being around people who are sick. Check with your health care provider if you have  severe diarrhea, nausea, and vomiting, or if you sweat a lot. The loss of too much body fluid may make it dangerous for you to take this medicine. Do not become pregnant while taking this medicine or for 7 months after stopping it. Women should inform their health care provider if they wish to become pregnant or think they might be pregnant. Men should not father a child while taking this medicine and for 4 months after stopping it. There is a potential for serious harm to an unborn child. Talk to your health care provider for more information. Do not breast-feed an infant while taking this medicine or for 2 months after stopping it. This medicine may make it more difficult to get pregnant or father a child. Talk to your health care provider if you are concerned about your fertility. What side effects may I notice from receiving this medication? Side effects that you should report to your doctor or health care professional as soon as possible: allergic reactions (skin rash; itching or hives; swelling of the face, lips, or tongue) bleeding (bloody or black, tarry stools; red or dark brown urine; spitting up blood or brown material that looks like coffee grounds; red spots on the skin; unusual bruising or bleeding from  the eye, gums, or nose) blurred vision or changes in vision confusion constipation headache heart failure (trouble breathing; fast, irregular heartbeat; sudden weight gain; swelling of the ankles, feet, hands) infection (fever, chills, cough, sore throat, pain or trouble passing urine) lack or loss of appetite liver injury (dark yellow or brown urine; general ill feeling or flu-like symptoms; loss of appetite, right upper belly pain; yellowing of the eyes or skin) low blood pressure (dizziness; feeling faint or lightheaded, falls; unusually weak or tired) muscle cramps pain, redness, or irritation at site where injected pain, tingling, numbness in the hands or feet seizures trouble  breathing unusual bruising or bleeding Side effects that usually do not require medical attention (report to your doctor or health care professional if they continue or are bothersome): diarrhea nausea stomach pain trouble sleeping vomiting This list may not describe all possible side effects. Call your doctor for medical advice about side effects. You may report side effects to FDA at 1-800-FDA-1088. Where should I keep my medication? This medicine is given in a hospital or clinic. It will not be stored at home. NOTE: This sheet is a summary. It may not cover all possible information. If you have questions about this medicine, talk to your doctor, pharmacist, or health care provider.  2023 Elsevier/Gold Standard (2020-11-11 00:00:00)  Daratumumab injection What is this medication? DARATUMUMAB (dar a toom ue mab) is a monoclonal antibody. It is used to treat multiple myeloma. This medicine may be used for other purposes; ask your health care provider or pharmacist if you have questions. COMMON BRAND NAME(S): DARZALEX What should I tell my care team before I take this medication? They need to know if you have any of these conditions: hereditary fructose intolerance infection (especially a virus infection such as chickenpox, herpes, or hepatitis B virus) lung or breathing disease (asthma, COPD) an unusual or allergic reaction to daratumumab, sorbitol, other medicines, foods, dyes, or preservatives pregnant or trying to get pregnant breast-feeding How should I use this medication? This medicine is for infusion into a vein. It is given by a health care professional in a hospital or clinic setting. Talk to your pediatrician regarding the use of this medicine in children. Special care may be needed. Overdosage: If you think you have taken too much of this medicine contact a poison control center or emergency room at once. NOTE: This medicine is only for you. Do not share this medicine with  others. What if I miss a dose? Keep appointments for follow-up doses as directed. It is important not to miss your dose. Call your doctor or health care professional if you are unable to keep an appointment. What may interact with this medication? Interactions have not been studied. This list may not describe all possible interactions. Give your health care provider a list of all the medicines, herbs, non-prescription drugs, or dietary supplements you use. Also tell them if you smoke, drink alcohol, or use illegal drugs. Some items may interact with your medicine. What should I watch for while using this medication? Your condition will be monitored carefully while you are receiving this medicine. This medicine can cause serious allergic reactions. To reduce your risk, your health care provider may give you other medicine to take before receiving this one. Be sure to follow the directions from your health care provider. This medicine can affect the results of blood tests to match your blood type. These changes can last for up to 6 months after the final dose. Your healthcare  provider will do blood tests to match your blood type before you start treatment. Tell all of your healthcare providers that you are being treated with this medicine before receiving a blood transfusion. This medicine can affect the results of some tests used to determine treatment response; extra tests may be needed to evaluate response. Do not become pregnant while taking this medicine or for 3 months after stopping it. Women should inform their health care provider if they wish to become pregnant or think they might be pregnant. There is a potential for serious side effects to an unborn child. Talk to your health care provider for more information. Do not breast-feed an infant while taking this medicine. What side effects may I notice from receiving this medication? Side effects that you should report to your care team as soon as  possible: Allergic reactions--skin rash, itching or hives, swelling of the face, lips, or tongue Blurred vision Infection--fever, chills, cough, sore throat, pain or trouble passing urine Infusion reactions--dizziness, fast heartbeat, feeling faint or lightheaded, falls, headache, increase in blood pressure, nausea, vomiting, or wheezing or trouble breathing with loud or whistling sounds Unusual bleeding or bruising Side effects that usually do not require medical attention (report to your care team if they continue or are bothersome): Constipation Diarrhea Pain, tingling, numbness in the hands or feet Swelling of the ankles, feet, hands Tiredness This list may not describe all possible side effects. Call your doctor for medical advice about side effects. You may report side effects to FDA at 1-800-FDA-1088. Where should I keep my medication? This drug is given in a hospital or clinic and will not be stored at home. NOTE: This sheet is a summary. It may not cover all possible information. If you have questions about this medicine, talk to your doctor, pharmacist, or health care provider.  2023 Elsevier/Gold Standard (2021-04-28 00:00:00)

## 2022-05-23 NOTE — Assessment & Plan Note (Signed)
Chemotherapy plan as listed above 

## 2022-05-23 NOTE — Progress Notes (Signed)
Hematology/Oncology Progress note [phone] [phone]      Patient Care Team: Baxter Hire, MD as PCP - General (Internal Medicine) Noreene Filbert, MD as Radiation Oncologist (Radiation Oncology) Baxter Hire, MD (Internal Medicine) Lavonia Dana, MD as Consulting Physician (Nephrology) [name], MD as Consulting Physician (Oncology)  ASSESSMENT & PLAN:   Multiple myeloma [facility])  #Stage III IgA kappa multiple myeloma, myeloma FISH panel positive for 13q deletion, gain of 1q, t (4;14), high risk, not candidate for autologous bone marrow transplant. 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 Labs reviewed and discussed patient M protein level and light chain ratio are both improving. Recommend patient to proceed with cycle 3 Daratumumab-RVD- [Revlimid 83m 3 weeks on 1 week off]  Continue aspirin 81 mg daily for DVT prophylaxis. Acyclovir 2065mBID  -Bone health, currently he does not have an active myeloma bone involvement.  We discussed about future recommendation of bisphosphonate or denosumab treatments.  Patient reports history of developing adverse reaction after Prolia treatments.  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  He will need distal ureterectomy.  [facility]  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 With his current kidney function, he is not a candidate for for neoadjuvant treatment with cisplatin regimen.  Pending [facility] recommendation.  He has the option of getting chemotherapy/radiation done locally for urothelial carcinoma.  Discussed with patient.  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 Hemoglobin has been stable.  Stage 4 chronic kidney disease (HCV[place]yKidney function slightly improved. Avoid nephrotoxins.  Encourage oral hydration.   Encounter for antineoplastic chemotherapy Chemotherapy plan as listed above  No orders of the defined types were placed in this encounter.  Follow-up Treatment today. Lab Velcade 1 week.  Lab Dara- Velcade 2 weeks  Lab Velcade 3 weeks  Lab MD Dara- Velcade 4 weeks All questions were answered. The patient knows to call the clinic with any problems, questions or concerns.  ZhEarlie ServerMD, PhD [facility] 05/23/2022    CHIEF COMPLAINTS/REASON FOR VISIT:  Multiple myeloma, high-grade urothelial carcinoma.  HISTORY OF PRESENTING ILLNESS:  [name] [name] [age] a [age] who was seen in consultation at the request of JoBaxter HireMD presents for follow-up of multiple myeloma and high-grade urothelial carcinoma management.    Patient has progressively worsening kidney function.  He is scheduled for kidney biopsy. 01/26/2022, free kappa light chain 1058, lambda 15.3, light chain ratio 69. Protein electrophoresis showed restricted band-M spike migrating in the beta-1 globulin region. ANA negative.  ANCA negative. Random urine protein electrophoresis showed abnormal protein band detected in the gamma globulin And a second possible abnormal protein band that may represent monoclonal protein.  Patient denies any back pain, unintentional weight loss, night sweats or fever. He lives at home with wife.  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   CMP showed creatinine of 3.41, that is  significantly worse than his baseline in June 2022. 02/28/2022,  24-hour urine protein electrophoresis showed M protein of 729 mg, IgA monoclonal kappa light chain. 03/01/2022, [facility] kidney biopsy showed diffuse light chain tubulopathy, and light chain cast nephropathy, moderate interstitial fibrosis,4% global glomerular sclerosis and the marked atherosclerosis.  03/02/2022 PET scan showed no definitive signs of multiple myeloma by FDG PET. Obstructive right ureter with soft tissue mass in the right pelvis showing increased metabolic activity suspicious for urothelial neoplasm.  Little or no excretion of FDG on the RIGHT but still with uptake of FDG by renal parenchyma. Degree of hydronephrosis is not substantially changed but lack of FDG excretion on the RIGHT is compatible with secondary sign of physiologic significance of ureteral obstruction. RIGHT pelvic sidewall uptake without visible lesion, could represent tiny lymph node not visible on noncontrast imaging. If the patient is no longer and acute renal failure could consider MRI with and without contrast for further assessment of pelvic findings. Ultimately cystoscopy may be helpful for further assessment, aortic atherosclerosis, pulmonary emphysema.   03/06/2022, patient underwent a bone marrow biopsy. Pathology showed hypercellular bone marrow with plasma cell neoplasm, representing 20% of all cells in the aspirate associated with prominent interstitial infiltrates and numerous predominantly small clusters in the clots in the biopsy sections.  The plasma cells display kappa light chain restriction consistent with plasma cell neoplasm. Normal cytogenetics, myeloma FISH panel positive for 13q deletion, gain of 1q, t (4;14)  Patient received 20 mg dexamethasone x1 after bone marrow biopsy.  03/14/2022, cystoscopy showed diffuse narrowing right distal ureter with hydroureter following up the distal ureter and extending proximally.  Right ureteroscopy  showed nodular tumor distal ureter and a convincing area without tumor suspicious for extrinsic obstruction.s/p   right ureteral stent placement. Ureteral tumor showed a tiny fragments of fibrous tissue, interpretation limited by thermal and crush artifact.  Right distal ureter saline barbotage showed hight grade urothelial carcinoma.   INTERVAL HISTORY Rollin Kotowski Bosso is a 81 y.o. male who has above history reviewed by me today presents for follow up visit for multiple myeloma and high-grade urothelial carcinoma.  Patient has tolerated daratumumab/Velcade treatments.  Patient tolerated Revlimid 5 mg 3 weeks on 1 week off.   Patient has developed erythematous adjacent to Velcade injection.  Symptom improved after applying topical steroid.  No nausea vomiting diarrhea, worsening of neuropathy symptoms. Recently patient repeated ureteroscopy at [facility].  Review of Systems  Constitutional:  Positive for fatigue. Negative for appetite change, chills, fever and unexpected weight change.  HENT:   Negative for hearing loss and voice change.   Eyes:  Negative for eye problems and icterus.  Respiratory:  Negative for chest tightness, cough and shortness of breath.   Cardiovascular:  Negative for chest pain and leg swelling.  Gastrointestinal:  Negative for abdominal distention and abdominal pain.  Endocrine: Negative for hot flashes.  Genitourinary:  Negative for difficulty urinating, dysuria and frequency.   Musculoskeletal:  Negative for arthralgias.  Skin:  Negative for itching and rash.  Neurological:  Negative for light-headedness and numbness.  Hematological:  Negative for adenopathy. Does not bruise/bleed easily.  Psychiatric/Behavioral:  Negative for confusion.      MEDICAL HISTORY:  Past Medical History:  Diagnosis Date   Age related osteoporosis    Anemia    Barrett's esophagus    Bradycardia    Cancer (HCC)    PROSTATE   Cataract    CKD (chronic kidney disease) stage 3, GFR 30-59  ml/min (HCC)    Colon polyps    DDD (  degenerative disc disease), cervical    DDD (degenerative disc disease), lumbar    Femur fracture (HCC)    Right   Fundic gland polyposis of stomach    Fundic gland polyps of stomach, benign    Gastritis    GERD (gastroesophageal reflux disease)    Hematuria    Hemorrhage of rectum and anus    History of colon polyps    Insomnia    Lumbago    Osteopenia of the elderly    Skin cancer    Sleep apnea     SURGICAL HISTORY: Past Surgical History:  Procedure Laterality Date   BAND HEMORRHOIDECTOMY     COLONOSCOPY     COLONOSCOPY WITH PROPOFOL N/A 05/06/2018   Procedure: COLONOSCOPY WITH PROPOFOL;  Surgeon: Lollie Sails, MD;  Location: [facility] ENDOSCOPY;  Service: Endoscopy;  Laterality: N/A;   COLONOSCOPY WITH PROPOFOL N/A 09/23/2018   Procedure: COLONOSCOPY WITH PROPOFOL;  Surgeon: Lollie Sails, MD;  Location: [facility] ENDOSCOPY;  Service: Endoscopy;  Laterality: N/A;   COLONOSCOPY WITH PROPOFOL N/A 03/30/2021   Procedure: COLONOSCOPY WITH PROPOFOL;  Surgeon: Toledo, Benay Pike, MD;  Location: [facility] ENDOSCOPY;  Service: Gastroenterology;  Laterality: N/A;   CYSTOSCOPY W/ URETERAL STENT PLACEMENT Right 03/14/2022   Procedure: CYSTOSCOPY WITH RETROGRADE PYELOGRAM/URETERAL STENT PLACEMENT;  Surgeon: Abbie Sons, MD;  Location: [facility] ORS;  Service: Urology;  Laterality: Right;   ESOPHAGOGASTRODUODENOSCOPY (EGD) WITH PROPOFOL N/A 10/03/2016   Procedure: ESOPHAGOGASTRODUODENOSCOPY (EGD) WITH PROPOFOL;  Surgeon: Lollie Sails, MD;  Location[facility] END[facility]COPY;  Service: Endoscopy;  Laterality: N/A;   EYE SURGERY     CATARACTS   EYELID SURGERY     FLEXIBLE SIGMOIDOSCOPY     FRACTURE SURGERY Right 2016   femur   FRACTURE SURGERY     ORIF DISTAL FEMUR FRACTURE     TONSILLECTOMY     TRANSURETHRAL RESECTION OF BLADDER TUMOR N/A 03/14/2022   Procedure: TRANSURETHRAL RESECTION OF BLADDER TUMOR (TURBT);  Surgeon: Abbie Sons, MD;  Location:  [facility] ORS;  Service: Urology;  Laterality: N/A;    SOCIAL HISTORY: Social History   Socioeconomic History   Marital status: Married    Spouse name: Not on file   Number of children: Not on file   Years of education: Not on file   Highest education level: Not on file  Occupational History   Not on file  Tobacco Use   Smoking status: Former    Packs/day: 1.00    Years: 15.00    Total pack years: 15.00    Types: Cigarettes    Quit date: 10/03/1974    Years since quitting: 47.6   Smokeless tobacco: Never  Vaping Use   Vaping Use: Never used  Substance and Sexual Activity   Alcohol use: Not Currently    Alcohol/week: 1.0 standard drink of alcohol    Types: 1 Cans of beer per week    Comment: 1 beer every 2 weeks   Drug use: No   Sexual activity: Yes    Birth control/protection: None  Other Topics Concern   Not on file  Social History Narrative   ** Merged History Encounter **       Social Determinants of Health   Financial Resource Strain: Not on file  Food Insecurity: Not on file  Transportation Needs: Not on file  Physical Activity: Not on file  Stress: Not on file  Social Connections: Not on file  Intimate Partner Violence: Not on file    FAMILY HISTORY:  Family History  Problem Relation Age of Onset   Breast cancer Mother     ALLERGIES:  is allergic to demerol [meperidine hcl], prolia [denosumab], demerol [meperidine], and nsaids.  MEDICATIONS:  Current Outpatient Medications  Medication Sig Dispense Refill   acyclovir (ZOVIRAX) 200 MG capsule Take 1 capsule (200 mg total) by mouth 2 (two) times daily. 60 capsule 3   aspirin EC 81 MG tablet Take 1 tablet (81 mg total) by mouth daily. Swallow whole. 30 tablet 11   Calcium Carbonate (CALCIUM 500 PO) Take 1 tablet by mouth daily.     Cholecalciferol (VITAMIN D3) 125 MCG (5000 UT) CAPS Take 5,000 Units by mouth daily.     dexamethasone (DECADRON) 4 MG tablet Take 5 tablets (20 mg total) by mouth once a week.  20 tablet 0   ferrous sulfate 325 (65 FE) MG tablet Take 325 mg by mouth daily.     gabapentin (NEURONTIN) 300 MG capsule Take 1 capsule (300 mg total) by mouth at bedtime. 30 capsule 0   HYDROcodone-acetaminophen (NORCO) 7.5-325 MG tablet 0.5-1 tab every 6 hours as needed for pain 8 tablet 0   ipratropium (ATROVENT) 0.03 % nasal spray Place 1 spray into both nostrils in the morning.     lenalidomide (REVLIMID) 5 MG capsule Take 1 capsule (5 mg total) by mouth See admin instructions. TAKE 1 CAPSULE BY MOUTH ONCE DAILY FOR 21 DAYS ON AND 7 DAYS OFF 21 capsule 0   levofloxacin (LEVAQUIN) 250 MG tablet Take 2 tablets (500 mg total) by mouth daily for 1 day, THEN 1 tablet (250 mg total) daily for 6 days. 8 tablet 0   losartan (COZAAR) 25 MG tablet Take 25 mg by mouth daily.     melatonin 5 MG TABS Take 5 mg by mouth at bedtime.     montelukast (SINGULAIR) 10 MG tablet Take 1 tablet (10 mg total) by mouth See admin instructions. Take 1 tablet the day prior to Daratumumab treatments, and take 1 tablet daily for 2 days after treatments 30 tablet 1   Multiple Vitamins-Minerals (MENS 50+ MULTIVITAMIN) TABS Take 1 tablet by mouth daily.     ondansetron (ZOFRAN) 8 MG tablet Take 1 tablet (8 mg total) by mouth 2 (two) times daily as needed (Nausea or vomiting). 30 tablet 1   traZODone (DESYREL) 50 MG tablet Take 25 mg by mouth at bedtime.     triamcinolone ointment (KENALOG) 0.5 % Apply 1 application. topically 2 (two) times daily. 30 g 0   No current facility-administered medications for this visit.     PHYSICAL EXAMINATION: ECOG PERFORMANCE STATUS: 1 - Symptomatic but completely ambulatory Today's Vitals   05/23/22 0830  BP: (!) 130/43  Pulse: (!) 45  Temp: (!) 96.2 F (35.7 C)  TempSrc: Tympanic  SpO2: 92%  Weight: 150 lb (68 kg)  Height: '5\' 7"'  (1.702 m)  PainSc: 0-No pain   Body mass index is 23.49 kg/m.   Physical Exam Constitutional:      General: He is not in acute distress. HENT:      Head: Normocephalic and atraumatic.  Eyes:     General: No scleral icterus. Cardiovascular:     Rate and Rhythm: Normal rate and regular rhythm.     Heart sounds: Normal heart sounds.  Pulmonary:     Effort: Pulmonary effort is normal. No respiratory distress.     Breath sounds: No wheezing.  Abdominal:     General: Bowel sounds are normal. There is no  distension.     Palpations: Abdomen is soft.  Musculoskeletal:        General: No deformity. Normal range of motion.     Cervical back: Normal range of motion and neck supple.  Skin:    General: Skin is warm and dry.     Findings: No erythema or rash.  Neurological:     Mental Status: He is alert and oriented to person, place, and time. Mental status is at baseline.     Cranial Nerves: No cranial nerve deficit.     Coordination: Coordination normal.  Psychiatric:        Mood and Affect: Mood normal.      LABORATORY DATA:  I have reviewed the data as listed Lab Results  Component Value Date   WBC 4.2 05/23/2022   HGB 10.5 (L) 05/23/2022   HCT 32.4 (L) 05/23/2022   MCV 96.1 05/23/2022   PLT 175 05/23/2022   Recent Labs    05/02/22 0923 05/09/22 0757 05/16/22 0754  NA 136 140 138  K 4.3 4.0 4.2  CL 104 105 108  CO2 '27 26 25  ' GLUCOSE 94 96 103*  BUN 40* 39* 43*  CREATININE 3.05* 2.76* 2.87*  CALCIUM 8.0* 8.6* 8.2*  GFRNONAA 20* 22* 21*  PROT 5.9* 5.9* 6.0*  ALBUMIN 3.1* 3.3* 3.2*  AST '17 19 19  ' ALT '14 16 17  ' ALKPHOS 62 72 70  BILITOT 0.5 0.3 0.5    Iron/TIBC/Ferritin/ %Sat    Component Value Date/Time   IRON 60 02/23/2022 1523   TIBC 238 (L) 02/23/2022 1523   FERRITIN 86 02/23/2022 1523   IRONPCTSAT 25 02/23/2022 1523       RADIOGRAPHIC STUDIES: I have personally reviewed the radiological images as listed and agreed with the findings in the report.  DG Chest 2 View  Result Date: 05/16/2022 CLINICAL DATA:  Cough and wheezing. EXAM: CHEST - 2 VIEW COMPARISON:  June 23, 2021 FINDINGS: The heart  size and mediastinal contours are within normal limits. Mild patchy opacities identified in the left lung base. The right lung is clear. There is no pleural effusion or pulmonary edema. Chronic deformity of the right clavicle is noted. IMPRESSION: Mild patchy opacities identified in the left lung base, developing pneumonia is not excluded. Electronically Signed   By: Abelardo Diesel M.D.   On: 05/16/2022 10:37

## 2022-05-23 NOTE — Assessment & Plan Note (Signed)
Anemia is multifactorial, primarily from chronic kidney disease, as well as chemotherapy. Hemoglobin has been stable.

## 2022-05-23 NOTE — Assessment & Plan Note (Addendum)
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 Labs reviewed and discussed patient M protein level and light chain ratio are both improving. Recommend patient to proceed with cycle 3 Daratumumab-RVD- [Revlimid 5mg  3 weeks on 1 week off]  Continue aspirin 81 mg daily for DVT prophylaxis. Acyclovir 200mg  BID  -Bone health, currently he does not have an active myeloma bone involvement.  We discussed about future recommendation of bisphosphonate or denosumab treatments.  Patient reports history of developing adverse reaction after Prolia treatments.

## 2022-05-23 NOTE — Assessment & Plan Note (Addendum)
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  He will need distal ureterectomy.  Pending [facility] urology oncology evaluation.  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 With his current kidney function, he is not a candidate for for neoadjuvant treatment with cisplatin regimen.  Pending [facility] recommendation.  He has the option of getting chemotherapy/radiation done locally for urothelial carcinoma.  Discussed with patient.

## 2022-05-29 DIAGNOSIS — C689 Malignant neoplasm of urinary organ, unspecified: Secondary | ICD-10-CM | POA: Insufficient documentation

## 2022-05-30 ENCOUNTER — Other Ambulatory Visit: Payer: Self-pay | Admitting: Oncology

## 2022-05-30 ENCOUNTER — Inpatient Hospital Stay: Payer: Medicare Other | Attending: Oncology

## 2022-05-30 ENCOUNTER — Inpatient Hospital Stay: Payer: Medicare Other

## 2022-05-30 VITALS — BP 134/44 | HR 46 | Temp 96.7°F | Resp 18 | Ht 67.0 in | Wt 150.0 lb

## 2022-05-30 DIAGNOSIS — Z5112 Encounter for antineoplastic immunotherapy: Secondary | ICD-10-CM | POA: Diagnosis present

## 2022-05-30 DIAGNOSIS — C9000 Multiple myeloma not having achieved remission: Secondary | ICD-10-CM | POA: Insufficient documentation

## 2022-05-30 LAB — COMPREHENSIVE METABOLIC PANEL
ALT: 15 U/L (ref 0–44)
AST: 18 U/L (ref 15–41)
Albumin: 3.2 g/dL — ABNORMAL LOW (ref 3.5–5.0)
Alkaline Phosphatase: 74 U/L (ref 38–126)
Anion gap: 7 (ref 5–15)
BUN: 42 mg/dL — ABNORMAL HIGH (ref 8–23)
CO2: 27 mmol/L (ref 22–32)
Calcium: 8.2 mg/dL — ABNORMAL LOW (ref 8.9–10.3)
Chloride: 103 mmol/L (ref 98–111)
Creatinine, Ser: 2.83 mg/dL — ABNORMAL HIGH (ref 0.61–1.24)
GFR, Estimated: 22 mL/min — ABNORMAL LOW (ref >60)
Glucose, Bld: 110 mg/dL — ABNORMAL HIGH (ref 70–99)
Potassium: 4.1 mmol/L (ref 3.5–5.1)
Sodium: 137 mmol/L (ref 135–145)
Total Bilirubin: 0.5 mg/dL (ref 0.3–1.2)
Total Protein: 5.8 g/dL — ABNORMAL LOW (ref 6.5–8.1)

## 2022-05-30 LAB — CBC WITH DIFFERENTIAL/PLATELET
Abs Immature Granulocytes: 0.01 10*3/uL (ref 0.00–0.07)
Basophils Absolute: 0 10*3/uL (ref 0.0–0.1)
Basophils Relative: 1 %
Eosinophils Absolute: 0.6 10*3/uL — ABNORMAL HIGH (ref 0.0–0.5)
Eosinophils Relative: 12 %
HCT: 32.1 % — ABNORMAL LOW (ref 39.0–52.0)
Hemoglobin: 10.5 g/dL — ABNORMAL LOW (ref 13.0–17.0)
Immature Granulocytes: 0 %
Lymphocytes Relative: 12 %
Lymphs Abs: 0.6 10*3/uL — ABNORMAL LOW (ref 0.7–4.0)
MCH: 31.3 pg (ref 26.0–34.0)
MCHC: 32.7 g/dL (ref 30.0–36.0)
MCV: 95.8 fL (ref 80.0–100.0)
Monocytes Absolute: 0.4 10*3/uL (ref 0.1–1.0)
Monocytes Relative: 9 %
Neutro Abs: 3 10*3/uL (ref 1.7–7.7)
Neutrophils Relative %: 66 %
Platelets: 160 10*3/uL (ref 150–400)
RBC: 3.35 MIL/uL — ABNORMAL LOW (ref 4.22–5.81)
RDW: 14.6 % (ref 11.5–15.5)
WBC: 4.6 10*3/uL (ref 4.0–10.5)
nRBC: 0 % (ref 0.0–0.2)

## 2022-05-30 MED ORDER — BORTEZOMIB CHEMO SQ INJECTION 3.5 MG (2.5MG/ML)
1.3000 mg/m2 | Freq: Once | INTRAMUSCULAR | Status: AC
  Administered 2022-05-30: 2.25 mg via SUBCUTANEOUS
  Filled 2022-05-30: qty 0.9

## 2022-05-30 MED ORDER — ACETAMINOPHEN 325 MG PO TABS: 650.0000 mg | ORAL_TABLET | Freq: Once | ORAL | Status: DC

## 2022-05-30 MED ORDER — DEXAMETHASONE 4 MG PO TABS
20.0000 mg | ORAL_TABLET | Freq: Once | ORAL | Status: AC
  Administered 2022-05-30: 20 mg via ORAL
  Filled 2022-05-30: qty 5

## 2022-05-30 MED ORDER — DIPHENHYDRAMINE HCL 25 MG PO CAPS: 50.0000 mg | ORAL_CAPSULE | Freq: Once | ORAL | Status: DC

## 2022-05-31 ENCOUNTER — Telehealth: Payer: Self-pay

## 2022-05-31 NOTE — Telephone Encounter (Signed)
Good morning ladies- Per Dr. Tasia Catchings, please schedule patient with Dr. Baruch Gouty for newly diagnosed urothelial carcinoma. Thank you

## 2022-05-31 NOTE — Telephone Encounter (Signed)
-----   Message from [name], MD sent at 05/30/2022  4:47 PM EDT ----- Please arrange pt to see Dr.Chrystal. he was seen previously by him.  Newly diagnosed urothelial carcinoma

## 2022-06-01 ENCOUNTER — Encounter: Payer: Self-pay | Admitting: Oncology

## 2022-06-01 ENCOUNTER — Encounter: Payer: Self-pay | Admitting: Radiation Oncology

## 2022-06-01 ENCOUNTER — Ambulatory Visit
Admission: RE | Admit: 2022-06-01 | Discharge: 2022-06-01 | Disposition: A | Discharge status: Home / Self Care | Payer: Medicare Other | Source: Ambulatory Visit | Attending: Radiation Oncology | Admitting: Radiation Oncology

## 2022-06-01 VITALS — BP 150/54 | HR 46 | Resp 18 | Ht 67.0 in | Wt 151.3 lb

## 2022-06-01 DIAGNOSIS — Z8546 Personal history of malignant neoplasm of prostate: Secondary | ICD-10-CM | POA: Insufficient documentation

## 2022-06-01 DIAGNOSIS — C9000 Multiple myeloma not having achieved remission: Secondary | ICD-10-CM | POA: Insufficient documentation

## 2022-06-01 DIAGNOSIS — C674 Malignant neoplasm of posterior wall of bladder: Secondary | ICD-10-CM | POA: Insufficient documentation

## 2022-06-01 DIAGNOSIS — C689 Malignant neoplasm of urinary organ, unspecified: Secondary | ICD-10-CM

## 2022-06-01 NOTE — Consult Note (Signed)
NEW PATIENT EVALUATION  Name: [name] [name]  MRN: [id number]  Date:   06/01/2022     DOB: [birth year]   This 81 y.o. 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  REFERRING PHYSICIAN: Baxter Hire, MD  CHIEF COMPLAINT:  Chief Complaint  Patient presents with   Urothelial Carcinoma    OPNA    DIAGNOSIS: The encounter diagnosis was Urothelial carcinoma ([place]).   PREVIOUS INVESTIGATIONS:  PET CT scan reviewed operative report including cystoscopy reviewed Pathology reports reviewed Clinical notes reviewed  HPI: Patient is a 81 year old male former patient of mine over 4 years prior treated with IMRT radiation therapy for Gleason 7 (3+4) adenocarcinoma the prostate presenting with a PSA of 30.  He is currently under excellent biochemical control based on his PSA.  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  Currently being treated with Daratumumab-RVD- [Revlimid he had a stent placed in his right kidney for soft tissue mass which is seen on PET CT scan and biopsy was positive for high-grade urothelial carcinoma.  Patient also underwent cystoscopy showing a papillary lesion in the right posterior bladder wall superior to the right ureteral orifice again positive for high-grade urothelial carcinoma.  The bladder tumor measured 6 2 cm.  There was muscularis propria invasion.  Patient did have intravesical gemcitabine and mitomycin done at the time of cystoscopy.  He has been [facility] and treatment plan is being formulated although they favor a palliative course of radiation therapy to both bladder as well as the proximal urothelial lesion.  His PET CT scan showed obstruction of the right ureter with a soft tissue patient  mass in the right pelvis hypermetabolic suspicious for neoplasm.  Patient also had right pelvic sidewall increased uptake on PET scan which could represent a tiny lymph node.  Patient is fairly asymptomatic.  Specifically denies any increased lower urinary tract symptoms hematuria or pain.  PLANNED TREATMENT REGIMEN: SBRT to his right proximal ureteral lesion.  Also would favor radiation therapy protocol possibly with concurrent chemotherapy to his pelvis for his high-grade bladder carcinoma.  PAST MEDICAL HISTORY:  has a past medical history of Age related osteoporosis, Anemia, Barrett's esophagus, Bradycardia, Cancer ([place]), Cataract, CKD (chronic kidney disease) stage 3, GFR 30-59 ml/min (HCC), Colon polyps, DDD (degenerative disc disease), cervical, DDD (degenerative disc disease), lumbar, Femur fracture ([place]), Fundic gland polyposis of stomach, Fundic gland polyps of stomach, benign, Gastritis, GERD (gastroesophageal reflux disease), Hematuria, Hemorrhage of rectum and anus, History of colon polyps, Insomnia, Lumbago, Osteopenia of the elderly, Skin cancer, and Sleep apnea.    PAST SURGICAL HISTORY:  Past Surgical History:  Procedure Laterality Date   BAND HEMORRHOIDECTOMY     COLONOSCOPY     COLONOSCOPY WITH PROPOFOL N/A 05/06/2018   Procedure: COLONOSCOPY WITH PROPOFOL;  Surgeon: Lollie Sails, MD;  Location: [facility] ENDOSCOPY;  Service: Endoscopy;  Laterality: N/A;   COLONOSCOPY WITH PROPOFOL N/A 09/23/2018   Procedure: COLONOSCOPY WITH PROPOFOL;  Surgeon: Lollie Sails, MD;  Location: [facility] ENDOSCOPY;  Service: Endoscopy;  Laterality: N/A;   COLONOSCOPY WITH PROPOFOL N/A 03/30/2021   Procedure: COLONOSCOPY WITH PROPOFOL;  Surgeon: Toledo, Benay Pike, MD;  Location: [facility] ENDOSCOPY;  Service: Gastroenterology;  Laterality: N/A;   CYSTOSCOPY W/ URETERAL STENT  PLACEMENT Right 03/14/2022   Procedure: CYSTOSCOPY WITH RETROGRADE PYELOGRAM/URETERAL STENT PLACEMENT;  Surgeon: Abbie Sons,  MD;  Location: [facility] ORS;  Service: Urology;  Laterality: Right;   ESOPHAGOGASTRODUODENOSCOPY (EGD) WITH PROPOFOL N/A 10/03/2016   Procedure: ESOPHAGOGASTRODUODENOSCOPY (EGD) WITH PROPOFOL;  Surgeon: Lollie Sails, MD;  Location: [facility] ENDOSCOPY;  Service: Endoscopy;  Laterality: N/A;   EYE SURGERY     CATARACTS   EYELID SURGERY     FLEXIBLE SIGMOIDOSCOPY     FRACTURE SURGERY Right 2016   [place]   FRACTURE SURGERY     ORIF DISTAL FEMUR FRACTURE     TONSILLECTOMY     TRANSURETHRAL RESECTION OF BLADDER TUMOR N/A 03/14/2022   Procedure: TRANSURETHRAL RESECTION OF BLADDER TUMOR (TURBT);  Surgeon: Abbie Sons, MD;  Location: [facility] ORS;  Service: Urology;  Laterality: N/A;    FAMILY HISTORY: family history includes Breast cancer in his mother.  SOCIAL HISTORY:  reports that he quit smoking about 47 years ago. His smoking use included cigarettes. He has a 15.00 pack-year smoking history. He has never used smokeless tobacco. He reports that he does not currently use alcohol after a past usage of about 1.0 standard drink of alcohol per week. He reports that he does not use drugs.  ALLERGIES: Demerol [meperidine hcl], Prolia [denosumab], Demerol [meperidine], and Nsaids  MEDICATIONS:  Current Outpatient Medications  Medication Sig Dispense Refill   acyclovir (ZOVIRAX) 200 MG capsule Take 1 capsule (200 mg total) by mouth 2 (two) times daily. 60 capsule 3   aspirin EC 81 MG tablet Take 1 tablet (81 mg total) by mouth daily. Swallow whole. 30 tablet 11   Calcium Carbonate (CALCIUM 500 PO) Take 1 tablet by mouth daily.     Cholecalciferol (VITAMIN D3) 125 MCG (5000 UT) CAPS Take 5,000 Units by mouth daily.     dexamethasone (DECADRON) 4 MG tablet Take 5 tablets (20 mg total) by mouth once a week. 20 tablet 0   ferrous sulfate 325 (65 FE) MG tablet Take 325 mg by mouth daily.     gabapentin (NEURONTIN) 300 MG capsule Take 1 capsule (300 mg total) by mouth at bedtime. 30 capsule 0    HYDROcodone-acetaminophen (NORCO) 7.5-325 MG tablet 0.5-1 tab every 6 hours as needed for pain 8 tablet 0   ipratropium (ATROVENT) 0.03 % nasal spray Place 1 spray into both nostrils in the morning.     lenalidomide (REVLIMID) 5 MG capsule Take 1 capsule (5 mg total) by mouth See admin instructions. TAKE 1 CAPSULE BY MOUTH ONCE DAILY FOR 21 DAYS ON AND 7 DAYS OFF 21 capsule 0   losartan (COZAAR) 25 MG tablet Take 25 mg by mouth daily.     melatonin 5 MG TABS Take 5 mg by mouth at bedtime.     montelukast (SINGULAIR) 10 MG tablet Take 1 tablet (10 mg total) by mouth See admin instructions. Take 1 tablet the day prior to Daratumumab treatments, and take 1 tablet daily for 2 days after treatments 30 tablet 1   Multiple Vitamins-Minerals (MENS 50+ MULTIVITAMIN) TABS Take 1 tablet by mouth daily.     ondansetron (ZOFRAN) 8 MG tablet Take 1 tablet (8 mg total) by mouth 2 (two) times daily as needed (Nausea or vomiting). 30 tablet 1   traZODone (DESYREL) 50 MG tablet Take 25 mg by mouth at bedtime.     triamcinolone ointment (KENALOG) 0.5 % Apply 1 application. topically 2 (two) times daily. 30 g 0   No current facility-administered  medications for this encounter.    ECOG PERFORMANCE STATUS:  0 - Asymptomatic  REVIEW OF SYSTEMS: Patient has history of multiple myeloma as well as prostate cancer Patient denies any weight loss, fatigue, weakness, fever, chills or night sweats. Patient denies any loss of vision, blurred vision. Patient denies any ringing  of the ears or hearing loss. No irregular heartbeat. Patient denies heart murmur or history of fainting. Patient denies any chest pain or pain radiating to her upper extremities. Patient denies any shortness of breath, difficulty breathing at night, cough or hemoptysis. Patient denies any swelling in the lower legs. Patient denies any nausea vomiting, vomiting of blood, or coffee ground material in the vomitus. Patient denies any stomach pain. Patient states  has had normal bowel movements no significant constipation or diarrhea. Patient denies any dysuria, hematuria or significant nocturia. Patient denies any problems walking, swelling in the joints or loss of balance. Patient denies any skin changes, loss of hair or loss of weight. Patient denies any excessive worrying or anxiety or significant depression. Patient denies any problems with insomnia. Patient denies excessive thirst, polyuria, polydipsia. Patient denies any swollen glands, patient denies easy bruising or easy bleeding. Patient denies any recent infections, allergies or URI. Patient "s visual fields have not changed significantly in recent time. PHYSICAL EXAM: BP (!) 150/54   Pulse (!) 46   Resp 18   Ht '5\' 7"'  (1.702 m)   Wt 151 lb 4.8 oz (68.6 kg)   BMI 23.70 kg/m  Well-developed well-nourished patient in NAD. HEENT reveals PERLA, EOMI, discs not visualized.  Oral cavity is clear. No oral mucosal lesions are identified. Neck is clear without evidence of cervical or supraclavicular adenopathy. Lungs are clear to A&P. Cardiac examination is essentially unremarkable with regular rate and rhythm without murmur rub or thrill. Abdomen is benign with no organomegaly or masses noted. Motor sensory and DTR levels are equal and symmetric in the upper and lower extremities. Cranial nerves II through XII are grossly intact. Proprioception is intact. No peripheral adenopathy or edema is identified. No motor or sensory levels are noted. Crude visual fields are within normal range.  LABORATORY DATA: Pathology reports reviewed    RADIOLOGY RESULTS: PET CT scan reviewed compatible with above-stated findings   IMPRESSION: 2 areas of high-grade urothelial carcinoma 1 in the right proximal ureter as well as bladder and 81 year old male with known multiple myeloma as well as previously treated with external beam radiation for prostate cancer in an 81 year old male  PLAN: At this time I favor an SBRT approach  to his right proximal ureteral high-grade carcinoma.  Would also discuss with medical oncology concurrent chemoradiation therapy for his high-grade muscle invading bladder cancer.  We will treat to 31 Gray over 5 weeks to his bladder sparing previously rated prostate tissue.  Would also boost his bladder another 20 Gray to the area of initial tumor involvement.  Risks and benefits of treatment including possible increased lower urinary tract symptoms diarrhea fatigue alteration of blood counts all were reviewed in detail with the patient.  We will discuss the case with Dr. Tasia Catchings once [facility] makes his final recommendations.  Patient comprehends my recommendations well.  I would like to take this opportunity to thank you for allowing me to participate in the care of your patient.Noreene Filbert, MD

## 2022-06-07 ENCOUNTER — Inpatient Hospital Stay: Payer: Medicare Other

## 2022-06-07 VITALS — BP 113/47 | HR 42 | Temp 96.5°F | Resp 16 | Wt 150.0 lb

## 2022-06-07 DIAGNOSIS — Z510 Encounter for antineoplastic radiation therapy: Secondary | ICD-10-CM | POA: Insufficient documentation

## 2022-06-07 DIAGNOSIS — Z5112 Encounter for antineoplastic immunotherapy: Secondary | ICD-10-CM | POA: Insufficient documentation

## 2022-06-07 DIAGNOSIS — C9000 Multiple myeloma not having achieved remission: Secondary | ICD-10-CM | POA: Insufficient documentation

## 2022-06-07 DIAGNOSIS — C61 Malignant neoplasm of prostate: Secondary | ICD-10-CM | POA: Diagnosis not present

## 2022-06-07 DIAGNOSIS — C661 Malignant neoplasm of right ureter: Secondary | ICD-10-CM | POA: Insufficient documentation

## 2022-06-07 LAB — CBC WITH DIFFERENTIAL/PLATELET
Abs Immature Granulocytes: 0.01 10*3/uL (ref 0.00–0.07)
Basophils Absolute: 0 10*3/uL (ref 0.0–0.1)
Basophils Relative: 1 %
Eosinophils Absolute: 0.8 10*3/uL — ABNORMAL HIGH (ref 0.0–0.5)
Eosinophils Relative: 16 %
HCT: 30.3 % — ABNORMAL LOW (ref 39.0–52.0)
Hemoglobin: 10 g/dL — ABNORMAL LOW (ref 13.0–17.0)
Immature Granulocytes: 0 %
Lymphocytes Relative: 13 %
Lymphs Abs: 0.7 10*3/uL (ref 0.7–4.0)
MCH: 31.6 pg (ref 26.0–34.0)
MCHC: 33 g/dL (ref 30.0–36.0)
MCV: 95.9 fL (ref 80.0–100.0)
Monocytes Absolute: 0.8 10*3/uL (ref 0.1–1.0)
Monocytes Relative: 16 %
Neutro Abs: 2.8 10*3/uL (ref 1.7–7.7)
Neutrophils Relative %: 54 %
Platelets: 139 10*3/uL — ABNORMAL LOW (ref 150–400)
RBC: 3.16 MIL/uL — ABNORMAL LOW (ref 4.22–5.81)
RDW: 14.4 % (ref 11.5–15.5)
WBC: 5.1 10*3/uL (ref 4.0–10.5)
nRBC: 0 % (ref 0.0–0.2)

## 2022-06-07 LAB — COMPREHENSIVE METABOLIC PANEL
ALT: 14 U/L (ref 0–44)
AST: 17 U/L (ref 15–41)
Albumin: 3.2 g/dL — ABNORMAL LOW (ref 3.5–5.0)
Alkaline Phosphatase: 62 U/L (ref 38–126)
Anion gap: 8 (ref 5–15)
BUN: 44 mg/dL — ABNORMAL HIGH (ref 8–23)
CO2: 27 mmol/L (ref 22–32)
Calcium: 8 mg/dL — ABNORMAL LOW (ref 8.9–10.3)
Chloride: 101 mmol/L (ref 98–111)
Creatinine, Ser: 2.91 mg/dL — ABNORMAL HIGH (ref 0.61–1.24)
GFR, Estimated: 21 mL/min — ABNORMAL LOW (ref >60)
Glucose, Bld: 98 mg/dL (ref 70–99)
Potassium: 4.5 mmol/L (ref 3.5–5.1)
Sodium: 136 mmol/L (ref 135–145)
Total Bilirubin: 0.6 mg/dL (ref 0.3–1.2)
Total Protein: 5.6 g/dL — ABNORMAL LOW (ref 6.5–8.1)

## 2022-06-07 MED ORDER — DARATUMUMAB-HYALURONIDASE-FIHJ 1800-30000 MG-UT/15ML ~~LOC~~ SOLN
1800.0000 mg | Freq: Once | SUBCUTANEOUS | Status: AC
  Administered 2022-06-07: 1800 mg via SUBCUTANEOUS
  Filled 2022-06-07: qty 15

## 2022-06-07 MED ORDER — ACETAMINOPHEN 325 MG PO TABS
650.0000 mg | ORAL_TABLET | Freq: Once | ORAL | Status: AC
  Administered 2022-06-07: 650 mg via ORAL
  Filled 2022-06-07: qty 2

## 2022-06-07 MED ORDER — BORTEZOMIB CHEMO SQ INJECTION 3.5 MG (2.5MG/ML)
1.3000 mg/m2 | Freq: Once | INTRAMUSCULAR | Status: AC
  Administered 2022-06-07: 2.25 mg via SUBCUTANEOUS
  Filled 2022-06-07: qty 0.9

## 2022-06-07 MED ORDER — DEXAMETHASONE 4 MG PO TABS
20.0000 mg | ORAL_TABLET | Freq: Once | ORAL | Status: AC
  Administered 2022-06-07: 20 mg via ORAL
  Filled 2022-06-07: qty 5

## 2022-06-07 MED ORDER — DIPHENHYDRAMINE HCL 25 MG PO CAPS
50.0000 mg | ORAL_CAPSULE | Freq: Once | ORAL | Status: AC
  Administered 2022-06-07: 50 mg via ORAL
  Filled 2022-06-07: qty 2

## 2022-06-07 NOTE — Progress Notes (Signed)
After starting patient's injections, patient informed RN that he started having tinnitus approx. 4-5 days ago. Denies any other s/s. Reports tinnitus is constant. Dr. Tasia Catchings and team made aware. Patient monitored x 20 minutes post Darzalex injection. Patient tolerated treatment well.  MD team to reach out to patient regarding advice for tinnitus.

## 2022-06-07 NOTE — Patient Instructions (Signed)
[facility] CANCER CTR AT [place]  Discharge Instructions: Thank you for choosing [facility] to provide your oncology and hematology care.  If you have a lab appointment with the [facility], please go directly to the [facility] and check in at the registration area.  Wear comfortable clothing and clothing appropriate for easy access to any Portacath or PICC line.   We strive to give you quality time with your provider. You may need to reschedule your appointment if you arrive late (15 or more minutes).  Arriving late affects you and other patients whose appointments are after yours.  Also, if you miss three or more appointments without notifying the office, you may be dismissed from the clinic at the provider's discretion.      For prescription refill requests, have your pharmacy contact our office and allow 72 hours for refills to be completed.    Today you received the following chemotherapy and/or immunotherapy agents Darzalex & Velcade      To help prevent nausea and vomiting after your treatment, we encourage you to take your nausea medication as directed.  BELOW ARE SYMPTOMS THAT SHOULD BE REPORTED IMMEDIATELY: *FEVER GREATER THAN 100.4 F (38 C) OR HIGHER *CHILLS OR SWEATING *NAUSEA AND VOMITING THAT IS NOT CONTROLLED WITH YOUR NAUSEA MEDICATION *UNUSUAL SHORTNESS OF BREATH *UNUSUAL BRUISING OR BLEEDING *URINARY PROBLEMS (pain or burning when urinating, or frequent urination) *BOWEL PROBLEMS (unusual diarrhea, constipation, pain near the anus) TENDERNESS IN MOUTH AND THROAT WITH OR WITHOUT PRESENCE OF ULCERS (sore throat, sores in mouth, or a toothache) UNUSUAL RASH, SWELLING OR PAIN  UNUSUAL VAGINAL DISCHARGE OR ITCHING   Items with * indicate a potential emergency and should be followed up as soon as possible or go to the Emergency Department if any problems should occur.  Please show the CHEMOTHERAPY ALERT CARD or IMMUNOTHERAPY ALERT CARD at  check-in to the Emergency Department and triage nurse.  Should you have questions after your visit or need to cancel or reschedule your appointment, please contact [facility] CANCER [place] AT [place]  [phone] and follow the prompts.  Office hours are 8:00 a.m. to 4:30 p.m. Monday - Friday. Please note that voicemails left after 4:00 p.m. may not be returned until the following business day.  We are closed weekends and major holidays. You have access to a nurse at all times for urgent questions. Please call the main number to the clinic [phone] and follow the prompts.  For any non-urgent questions, you may also contact your provider using MyChart. We now offer e-Visits for anyone 83 and older to request care online for non-urgent symptoms. For details visit [email or web address].   Also download the MyChart app! Go to the app store, search "MyChart", open the app, select , and log in with your MyChart username and password.  Masks are optional in the cancer centers. If you would like for your care team to wear a mask while they are taking care of you, please let them know. For doctor visits, patients may have with them one support person who is at least 83 years old. At this time, visitors are not allowed in the infusion area.

## 2022-06-08 ENCOUNTER — Other Ambulatory Visit: Payer: Self-pay | Admitting: Oncology

## 2022-06-08 ENCOUNTER — Telehealth: Payer: Self-pay

## 2022-06-08 ENCOUNTER — Ambulatory Visit: Payer: Medicare Other

## 2022-06-08 DIAGNOSIS — Z510 Encounter for antineoplastic radiation therapy: Secondary | ICD-10-CM | POA: Diagnosis not present

## 2022-06-08 DIAGNOSIS — C61 Malignant neoplasm of prostate: Secondary | ICD-10-CM | POA: Insufficient documentation

## 2022-06-08 DIAGNOSIS — C661 Malignant neoplasm of right ureter: Secondary | ICD-10-CM | POA: Insufficient documentation

## 2022-06-08 MED ORDER — GABAPENTIN 300 MG PO CAPS: 300.0000 mg | ORAL_CAPSULE | Freq: Every day | ORAL | 3 refills | Status: DC

## 2022-06-08 NOTE — Telephone Encounter (Signed)
Per secure chat message from [place], infusion RN: Patient did not inform me until after I gave his injections today that he started having tinnitus about 4-5 days ago  Called and spoke to pt and informed him that this can be a side effect from chemo and Dr. Tasia Catchings sent a refill of Gabapentin for him to try. Pt states he may have some medication at home and will take.

## 2022-06-13 ENCOUNTER — Other Ambulatory Visit: Payer: Medicare Other

## 2022-06-13 ENCOUNTER — Ambulatory Visit: Payer: Medicare Other

## 2022-06-13 DIAGNOSIS — Z510 Encounter for antineoplastic radiation therapy: Secondary | ICD-10-CM | POA: Diagnosis not present

## 2022-06-14 ENCOUNTER — Inpatient Hospital Stay: Payer: Medicare Other

## 2022-06-14 VITALS — BP 109/49 | HR 42 | Temp 96.7°F | Resp 16 | Wt 150.0 lb

## 2022-06-14 DIAGNOSIS — C9000 Multiple myeloma not having achieved remission: Secondary | ICD-10-CM

## 2022-06-14 DIAGNOSIS — Z510 Encounter for antineoplastic radiation therapy: Secondary | ICD-10-CM | POA: Diagnosis not present

## 2022-06-14 LAB — COMPREHENSIVE METABOLIC PANEL
ALT: 16 U/L (ref 0–44)
AST: 18 U/L (ref 15–41)
Albumin: 3.4 g/dL — ABNORMAL LOW (ref 3.5–5.0)
Alkaline Phosphatase: 68 U/L (ref 38–126)
Anion gap: 8 (ref 5–15)
BUN: 51 mg/dL — ABNORMAL HIGH (ref 8–23)
CO2: 25 mmol/L (ref 22–32)
Calcium: 8.2 mg/dL — ABNORMAL LOW (ref 8.9–10.3)
Chloride: 102 mmol/L (ref 98–111)
Creatinine, Ser: 3.05 mg/dL — ABNORMAL HIGH (ref 0.61–1.24)
GFR, Estimated: 20 mL/min — ABNORMAL LOW (ref >60)
Glucose, Bld: 112 mg/dL — ABNORMAL HIGH (ref 70–99)
Potassium: 4.1 mmol/L (ref 3.5–5.1)
Sodium: 135 mmol/L (ref 135–145)
Total Bilirubin: 0.5 mg/dL (ref 0.3–1.2)
Total Protein: 5.9 g/dL — ABNORMAL LOW (ref 6.5–8.1)

## 2022-06-14 LAB — CBC WITH DIFFERENTIAL/PLATELET
Abs Immature Granulocytes: 0.01 10*3/uL (ref 0.00–0.07)
Basophils Absolute: 0 10*3/uL (ref 0.0–0.1)
Basophils Relative: 1 %
Eosinophils Absolute: 1.1 10*3/uL — ABNORMAL HIGH (ref 0.0–0.5)
Eosinophils Relative: 27 %
HCT: 32.5 % — ABNORMAL LOW (ref 39.0–52.0)
Hemoglobin: 10.5 g/dL — ABNORMAL LOW (ref 13.0–17.0)
Immature Granulocytes: 0 %
Lymphocytes Relative: 16 %
Lymphs Abs: 0.7 10*3/uL (ref 0.7–4.0)
MCH: 31.2 pg (ref 26.0–34.0)
MCHC: 32.3 g/dL (ref 30.0–36.0)
MCV: 96.4 fL (ref 80.0–100.0)
Monocytes Absolute: 0.8 10*3/uL (ref 0.1–1.0)
Monocytes Relative: 19 %
Neutro Abs: 1.6 10*3/uL — ABNORMAL LOW (ref 1.7–7.7)
Neutrophils Relative %: 37 %
Platelets: 138 10*3/uL — ABNORMAL LOW (ref 150–400)
RBC: 3.37 MIL/uL — ABNORMAL LOW (ref 4.22–5.81)
RDW: 14.3 % (ref 11.5–15.5)
WBC: 4.2 10*3/uL (ref 4.0–10.5)
nRBC: 0 % (ref 0.0–0.2)

## 2022-06-14 MED ORDER — BORTEZOMIB CHEMO SQ INJECTION 3.5 MG (2.5MG/ML)
1.3000 mg/m2 | Freq: Once | INTRAMUSCULAR | Status: AC
  Administered 2022-06-14: 2.25 mg via SUBCUTANEOUS
  Filled 2022-06-14: qty 0.9

## 2022-06-14 MED ORDER — DEXAMETHASONE 4 MG PO TABS
20.0000 mg | ORAL_TABLET | Freq: Once | ORAL | Status: AC
  Administered 2022-06-14: 20 mg via ORAL
  Filled 2022-06-14: qty 5

## 2022-06-15 ENCOUNTER — Telehealth: Payer: Self-pay

## 2022-06-15 ENCOUNTER — Ambulatory Visit: Admission: RE | Admit: 2022-06-15 | Payer: Medicare Other | Source: Ambulatory Visit

## 2022-06-15 DIAGNOSIS — Z510 Encounter for antineoplastic radiation therapy: Secondary | ICD-10-CM | POA: Diagnosis not present

## 2022-06-15 NOTE — Telephone Encounter (Signed)
Contacted [facility] heme on 7/7/ 23 to request a copy of the tumor board recommendation notes to be faxed to [place].   7/13- called back to follow up since notes have not been received. Spoke to [place] who states that the nurse was waiting to find out if there were any notes. Asked her have nurse call back with tumor board recommendations of fax notes to our office. Ph and fax number provided.

## 2022-06-15 NOTE — Telephone Encounter (Signed)
Received call from [facility], triage nurse at [facility], who states that there are no tumor board notes, but she will fax most recent surg urology notes.

## 2022-06-15 NOTE — Telephone Encounter (Signed)
[facility] Notes scanned in media.

## 2022-06-16 ENCOUNTER — Other Ambulatory Visit: Payer: Self-pay | Admitting: *Deleted

## 2022-06-16 DIAGNOSIS — C9000 Multiple myeloma not having achieved remission: Secondary | ICD-10-CM

## 2022-06-16 LAB — KAPPA/LAMBDA LIGHT CHAINS
Kappa free light chain: 162 mg/L — ABNORMAL HIGH (ref 3.3–19.4)
Kappa, lambda light chain ratio: 12.18 — ABNORMAL HIGH (ref 0.26–1.65)
Lambda free light chains: 13.3 mg/L (ref 5.7–26.3)

## 2022-06-16 MED ORDER — LENALIDOMIDE 5 MG PO CAPS: 5.0000 mg | ORAL_CAPSULE | ORAL | 0 refills | Status: DC

## 2022-06-19 ENCOUNTER — Other Ambulatory Visit: Payer: Self-pay

## 2022-06-19 ENCOUNTER — Ambulatory Visit: Payer: Medicare Other

## 2022-06-19 ENCOUNTER — Ambulatory Visit
Admission: RE | Admit: 2022-06-19 | Discharge: 2022-06-19 | Disposition: A | Discharge status: Home / Self Care | Payer: Medicare Other | Source: Ambulatory Visit | Attending: Radiation Oncology | Admitting: Radiation Oncology

## 2022-06-19 ENCOUNTER — Telehealth: Payer: Self-pay | Admitting: *Deleted

## 2022-06-19 DIAGNOSIS — Z510 Encounter for antineoplastic radiation therapy: Secondary | ICD-10-CM | POA: Diagnosis not present

## 2022-06-19 LAB — RAD ONC ARIA SESSION SUMMARY
Course Elapsed Days: 0
Plan Fractions Treated to Date: 1
Plan Prescribed Dose Per Fraction: 1.8 Gy
Plan Total Fractions Prescribed: 25
Plan Total Prescribed Dose: 45 Gy
Reference Point Dosage Given to Date: 1.8 Gy
Reference Point Session Dosage Given: 1.8 Gy
Session Number: 1

## 2022-06-19 LAB — MULTIPLE MYELOMA PANEL, SERUM
Albumin SerPl Elph-Mcnc: 3.3 g/dL (ref 2.9–4.4)
Albumin/Glob SerPl: 1.7 (ref 0.7–1.7)
Alpha 1: 0.2 g/dL (ref 0.0–0.4)
Alpha2 Glob SerPl Elph-Mcnc: 0.6 g/dL (ref 0.4–1.0)
B-Globulin SerPl Elph-Mcnc: 0.9 g/dL (ref 0.7–1.3)
Gamma Glob SerPl Elph-Mcnc: 0.4 g/dL (ref 0.4–1.8)
Globulin, Total: 2 g/dL — ABNORMAL LOW (ref 2.2–3.9)
IgA: 508 mg/dL — ABNORMAL HIGH (ref 61–437)
IgG (Immunoglobin G), Serum: 418 mg/dL — ABNORMAL LOW (ref 603–1613)
IgM (Immunoglobulin M), Srm: 32 mg/dL (ref 15–143)
M Protein SerPl Elph-Mcnc: 0.4 g/dL — ABNORMAL HIGH
Total Protein ELP: 5.3 g/dL — ABNORMAL LOW (ref 6.0–8.5)

## 2022-06-19 NOTE — Telephone Encounter (Signed)
Patient called and left message that he has dental appointment today and is going to need a root canal done either today or tomorrow and that he may be immunocompromised due to his treatment at [facility] and said to let doctor know what is going on.   Last velcade was on 06/14/22 and he get daily radiation therapy treatments

## 2022-06-19 NOTE — Telephone Encounter (Signed)
Josh/Sarah/Dr. Janese Banks. I reveiwed chart. Patient is not on any bisphosphates as appears that he "had an reaction after Prolia treatments" in 2017.  Last labs - appear stable. Please advise.

## 2022-06-19 NOTE — Telephone Encounter (Signed)
I spoke with patient. Explained to patient that he can get a root canal, but Dr. Janese Banks indicated that he will need antibiotics- Augmentin 500 mg twice a day for 5 days. Pt states his dentist just prescribed Amoxicilin 500 mg 2 tablets twice daily. He has not decided yet if he wants the root canal vs extraction. He uses [facility] in [place]. He does not know if the dentist can get him in this week or not. He will message our team back.   Should dentist not be able to get him in this week for his procedure, he wants to know if antibiotics are still needed since he is currently taking antibiotics. Dr. Janese Banks- Please advise.

## 2022-06-19 NOTE — Telephone Encounter (Signed)
He can get his root canal and get antibiotic prophylaxis for 5 days since he is miidly neutropenic. Augmentin 875 mg BID

## 2022-06-20 ENCOUNTER — Ambulatory Visit
Admission: RE | Admit: 2022-06-20 | Discharge: 2022-06-20 | Disposition: A | Discharge status: Home / Self Care | Payer: Medicare Other | Source: Ambulatory Visit | Attending: Radiation Oncology | Admitting: Radiation Oncology

## 2022-06-20 ENCOUNTER — Other Ambulatory Visit: Payer: Self-pay

## 2022-06-20 ENCOUNTER — Encounter: Payer: Self-pay | Admitting: Oncology

## 2022-06-20 DIAGNOSIS — Z510 Encounter for antineoplastic radiation therapy: Secondary | ICD-10-CM | POA: Diagnosis not present

## 2022-06-20 LAB — RAD ONC ARIA SESSION SUMMARY
Course Elapsed Days: 1
Plan Fractions Treated to Date: 2
Plan Prescribed Dose Per Fraction: 1.8 Gy
Plan Total Fractions Prescribed: 25
Plan Total Prescribed Dose: 45 Gy
Reference Point Dosage Given to Date: 3.6 Gy
Reference Point Session Dosage Given: 1.8 Gy
Session Number: 2

## 2022-06-21 ENCOUNTER — Inpatient Hospital Stay: Payer: Medicare Other

## 2022-06-21 ENCOUNTER — Other Ambulatory Visit: Payer: Self-pay

## 2022-06-21 ENCOUNTER — Ambulatory Visit
Admission: RE | Admit: 2022-06-21 | Discharge: 2022-06-21 | Disposition: A | Discharge status: Home / Self Care | Payer: Medicare Other | Source: Ambulatory Visit | Attending: Radiation Oncology | Admitting: Radiation Oncology

## 2022-06-21 ENCOUNTER — Encounter: Payer: Self-pay | Admitting: Oncology

## 2022-06-21 ENCOUNTER — Ambulatory Visit
Admission: RE | Admit: 2022-06-21 | Discharge: 2022-06-21 | Disposition: A | Discharge status: Home / Self Care | Payer: Medicare Other | Source: Ambulatory Visit | Attending: Oncology | Admitting: Oncology

## 2022-06-21 ENCOUNTER — Inpatient Hospital Stay (HOSPITAL_BASED_OUTPATIENT_CLINIC_OR_DEPARTMENT_OTHER): Payer: Medicare Other | Admitting: Oncology

## 2022-06-21 ENCOUNTER — Ambulatory Visit: Payer: Medicare Other

## 2022-06-21 VITALS — BP 128/47 | HR 41 | Temp 96.9°F | Resp 18 | Wt 154.8 lb

## 2022-06-21 DIAGNOSIS — Z5111 Encounter for antineoplastic chemotherapy: Secondary | ICD-10-CM

## 2022-06-21 DIAGNOSIS — R051 Acute cough: Secondary | ICD-10-CM | POA: Insufficient documentation

## 2022-06-21 DIAGNOSIS — M25473 Effusion, unspecified ankle: Secondary | ICD-10-CM | POA: Diagnosis not present

## 2022-06-21 DIAGNOSIS — C9000 Multiple myeloma not having achieved remission: Secondary | ICD-10-CM | POA: Diagnosis not present

## 2022-06-21 DIAGNOSIS — R059 Cough, unspecified: Secondary | ICD-10-CM | POA: Insufficient documentation

## 2022-06-21 DIAGNOSIS — N184 Chronic kidney disease, stage 4 (severe): Secondary | ICD-10-CM

## 2022-06-21 DIAGNOSIS — C669 Malignant neoplasm of unspecified ureter: Secondary | ICD-10-CM

## 2022-06-21 DIAGNOSIS — D631 Anemia in chronic kidney disease: Secondary | ICD-10-CM

## 2022-06-21 DIAGNOSIS — K047 Periapical abscess without sinus: Secondary | ICD-10-CM | POA: Insufficient documentation

## 2022-06-21 DIAGNOSIS — Z510 Encounter for antineoplastic radiation therapy: Secondary | ICD-10-CM | POA: Diagnosis not present

## 2022-06-21 LAB — COMPREHENSIVE METABOLIC PANEL
ALT: 17 U/L (ref 0–44)
AST: 18 U/L (ref 15–41)
Albumin: 3 g/dL — ABNORMAL LOW (ref 3.5–5.0)
Alkaline Phosphatase: 62 U/L (ref 38–126)
Anion gap: 5 (ref 5–15)
BUN: 38 mg/dL — ABNORMAL HIGH (ref 8–23)
CO2: 26 mmol/L (ref 22–32)
Calcium: 8.1 mg/dL — ABNORMAL LOW (ref 8.9–10.3)
Chloride: 108 mmol/L (ref 98–111)
Creatinine, Ser: 2.71 mg/dL — ABNORMAL HIGH (ref 0.61–1.24)
GFR, Estimated: 23 mL/min — ABNORMAL LOW (ref >60)
Glucose, Bld: 109 mg/dL — ABNORMAL HIGH (ref 70–99)
Potassium: 4.4 mmol/L (ref 3.5–5.1)
Sodium: 139 mmol/L (ref 135–145)
Total Bilirubin: 0.5 mg/dL (ref 0.3–1.2)
Total Protein: 5.3 g/dL — ABNORMAL LOW (ref 6.5–8.1)

## 2022-06-21 LAB — CBC WITH DIFFERENTIAL/PLATELET
Abs Immature Granulocytes: 0.01 10*3/uL (ref 0.00–0.07)
Basophils Absolute: 0 10*3/uL (ref 0.0–0.1)
Basophils Relative: 1 %
Eosinophils Absolute: 0.5 10*3/uL (ref 0.0–0.5)
Eosinophils Relative: 13 %
HCT: 31 % — ABNORMAL LOW (ref 39.0–52.0)
Hemoglobin: 10.1 g/dL — ABNORMAL LOW (ref 13.0–17.0)
Immature Granulocytes: 0 %
Lymphocytes Relative: 12 %
Lymphs Abs: 0.5 10*3/uL — ABNORMAL LOW (ref 0.7–4.0)
MCH: 31.5 pg (ref 26.0–34.0)
MCHC: 32.6 g/dL (ref 30.0–36.0)
MCV: 96.6 fL (ref 80.0–100.0)
Monocytes Absolute: 0.7 10*3/uL (ref 0.1–1.0)
Monocytes Relative: 18 %
Neutro Abs: 2.3 10*3/uL (ref 1.7–7.7)
Neutrophils Relative %: 56 %
Platelets: 166 10*3/uL (ref 150–400)
RBC: 3.21 MIL/uL — ABNORMAL LOW (ref 4.22–5.81)
RDW: 14.2 % (ref 11.5–15.5)
WBC: 4.1 10*3/uL (ref 4.0–10.5)
nRBC: 0 % (ref 0.0–0.2)

## 2022-06-21 LAB — RAD ONC ARIA SESSION SUMMARY
Course Elapsed Days: 2
Plan Fractions Treated to Date: 3
Plan Prescribed Dose Per Fraction: 1.8 Gy
Plan Total Fractions Prescribed: 25
Plan Total Prescribed Dose: 45 Gy
Reference Point Dosage Given to Date: 5.4 Gy
Reference Point Session Dosage Given: 1.8 Gy
Session Number: 3

## 2022-06-21 MED ORDER — DIPHENHYDRAMINE HCL 25 MG PO CAPS
50.0000 mg | ORAL_CAPSULE | Freq: Once | ORAL | Status: AC
  Administered 2022-06-21: 50 mg via ORAL
  Filled 2022-06-21: qty 2

## 2022-06-21 MED ORDER — ACETAMINOPHEN 325 MG PO TABS
650.0000 mg | ORAL_TABLET | Freq: Once | ORAL | Status: AC
  Administered 2022-06-21: 650 mg via ORAL
  Filled 2022-06-21: qty 2

## 2022-06-21 MED ORDER — SENNA 8.6 MG PO TABS: 2.0000 | ORAL_TABLET | Freq: Every day | ORAL | 0 refills | Status: DC

## 2022-06-21 MED ORDER — DARATUMUMAB-HYALURONIDASE-FIHJ 1800-30000 MG-UT/15ML ~~LOC~~ SOLN
1800.0000 mg | Freq: Once | SUBCUTANEOUS | Status: AC
  Administered 2022-06-21: 1800 mg via SUBCUTANEOUS
  Filled 2022-06-21: qty 15

## 2022-06-21 MED ORDER — DEXAMETHASONE 4 MG PO TABS
20.0000 mg | ORAL_TABLET | Freq: Once | ORAL | Status: AC
  Administered 2022-06-21: 20 mg via ORAL
  Filled 2022-06-21: qty 5

## 2022-06-21 NOTE — Assessment & Plan Note (Addendum)
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 Labs reviewed and discussed patient M protein level and light chain ratio are both improving. proceed with cycle 3 Daratumumab-hold off Revlimid and Velcade- he is currently on Radiation.  Continue aspirin 81 mg daily for DVT prophylaxis. Continue Acyclovir 266m BID  -Bone health, currently he does not have an active myeloma bone involvement.  We discussed about future recommendation of bisphosphonate or denosumab treatments.  Patient reports history of developing adverse reaction after Prolia treatments.

## 2022-06-21 NOTE — Progress Notes (Signed)
Pt is currently on antibiotic for tooth abscess and will need a root canal. Pt will need a root canal and is. Pt reports he continues to have ringing in the ear, he has taken gabapentin but it is not helping and it makes him very drowsy. He also complains of swelling to ankles. He has been congested and had a cough for the past 2 days. Pt will need Norco refill.

## 2022-06-21 NOTE — Assessment & Plan Note (Addendum)
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  He will need distal ureterectomy.  Pending [facility] urology oncology evaluation.  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 He has another PET at [facility]. Findings were reviewed.  [facility] recommends definitive dosing of conventionally fractionated radiotherapy with or without radiosensitizing chemotherapy or more palliative intent regimens. There was plan of his case to be discussed on [facility] tumor board and I have not received the final recommendation yet.  Discussed with patient about options of concurrent radiation with gemcitabine vs RT alone.  His myeloma has responded to treatments well and I recommend to back off Revlimid and Velcade while on urothelial cancer treatments.

## 2022-06-21 NOTE — Assessment & Plan Note (Signed)
Kidney function slightly improved. Avoid nephrotoxins.  Encourage oral hydration.

## 2022-06-21 NOTE — Assessment & Plan Note (Signed)
Stable hemoglobin. monitor 

## 2022-06-21 NOTE — Assessment & Plan Note (Signed)
Check [place] lower extremities bilaterally

## 2022-06-21 NOTE — Assessment & Plan Note (Signed)
Check CXR  

## 2022-06-21 NOTE — Assessment & Plan Note (Signed)
He has finished a course of Amoxicillin. Symptoms have improved.  Recommend future prophylactic antibiotic for his future root canal procedure.

## 2022-06-21 NOTE — Progress Notes (Signed)
Hematology/Oncology Progress note [phone] [phone]      Patient Care Team: Baxter Hire, MD as PCP - General (Internal Medicine) Noreene Filbert, MD as Radiation Oncologist (Radiation Oncology) Baxter Hire, MD (Internal Medicine) Lavonia Dana, MD as Consulting Physician (Nephrology) [name], MD as Consulting Physician (Oncology)  ASSESSMENT & PLAN:   Multiple myeloma [facility])  #Stage III IgA kappa multiple myeloma, myeloma FISH panel positive for 13q deletion, gain of 1q, t (4;14), high risk, not candidate for autologous bone marrow transplant. 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 Labs reviewed and discussed patient M protein level and light chain ratio are both improving. proceed with cycle 3 Daratumumab-hold off Revlimid and Velcade- he is currently on Radiation.  Continue aspirin 81 mg daily for DVT prophylaxis. Continue Acyclovir 259m BID  -Bone health, currently he does not have an active myeloma bone involvement.  We discussed about future recommendation of bisphosphonate or denosumab treatments.  Patient reports history of developing adverse reaction after Prolia treatments.   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  He will need distal ureterectomy.  [facility]  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 [facility]  [facility] recommends definitive dosing of conventionally fractionated radiotherapy with or without radiosensitizing chemotherapy or more palliative intent regimens. There was plan of his case to be discussed [facility]  and I have not received the final recommendation yet.  Discussed with patient about options of concurrent radiation with gemcitabine vs RT alone.  His myeloma has responded to treatments well and I recommend to back off Revlimid and Velcade while on urothelial cancer treatments.     Anemia in stage 4 chronic kidney disease (HCC) Stable hemoglobin. monitor  Encounter for antineoplastic chemotherapy Chemotherapy plan as listed above  Stage 4 chronic kidney disease ([place] Kidney function slightly improved. Avoid nephrotoxins.  Encourage oral hydration.   Tooth infection He has finished a course of Amoxicillin. Symptoms have improved.  Recommend future prophylactic antibiotic for his future root canal procedure.    Ankle swelling Check UKorealower extremities bilaterally  Cough Check CXR   Orders Placed This Encounter  Procedures   DG Chest 2 View    Standing Status:   Future    Number of Occurrences:   1    Standing Expiration Date:   06/21/2023    Order Specific Question:   Reason for Exam (SYMPTOM  OR DIAGNOSIS REQUIRED)    Answer:   cough    Order Specific Question:   Preferred imaging location?    Answer:   [facility]   UKoreaVenous Img Lower Bilateral    Standing Status:   Future    Standing Expiration Date:   06/21/2023    Order Specific Question:   Reason for Exam (SYMPTOM  OR DIAGNOSIS REQUIRED)    Answer:   ankle swelling    Order Specific Question:   Preferred imaging location?    Answer:   [facility]    Follow-up Lab Daratumumab in 2 weeks  Lab MD Dara 4 weeks All questions were answered. The patient knows to call the clinic with any problems, questions or concerns.  We spent sufficient time to discuss many aspect  of care, questions were answered to patient's satisfaction. A total of 45 minutes was spent on this visit.  With 10 minutes spent reviewing imaging results and oncology recommendation from [facility], 30 minutes counseling the patient on the   chemotherapy treatments, side effects of the treatment, management of symptoms.  Additional 5 minutes was spent on answering patient's questions.  [name], MD, PhD [facility] Hematology Oncology 06/21/2022    CHIEF COMPLAINTS/REASON FOR VISIT:  Multiple myeloma, high-grade urothelial carcinoma.  HISTORY OF PRESENTING ILLNESS:  [name] [name] is a 81 y.o. male who was seen in consultation at the request of Baxter Hire, MD presents for follow-up of multiple myeloma and high-grade urothelial carcinoma management.    Patient has progressively worsening kidney function.  He is scheduled for kidney biopsy. 01/26/2022, free kappa light chain 1058, lambda 15.3, light chain ratio 69. Protein electrophoresis showed restricted band-M spike migrating in the beta-1 globulin region. ANA negative.  ANCA negative. Random urine protein electrophoresis showed abnormal protein band detected in the gamma globulin And a second possible abnormal protein band that may represent monoclonal protein.  Patient denies any back pain, unintentional weight loss, night sweats or fever. He lives at home with wife.  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   CMP showed creatinine of 3.41, that is significantly worse than his baseline in June 2022. 02/28/2022, 24-hour urine protein electrophoresis showed M protein of 729 mg, IgA monoclonal kappa light chain. 03/01/2022, [facility] kidney biopsy showed diffuse light chain tubulopathy, and light chain cast nephropathy, moderate interstitial fibrosis,4% global glomerular sclerosis and the marked atherosclerosis.  03/02/2022 PET scan showed no definitive signs of multiple myeloma by FDG PET. Obstructive right ureter with soft tissue mass in the right pelvis showing  increased metabolic activity suspicious for urothelial neoplasm.  Little or no excretion of FDG on the RIGHT but still with uptake of FDG by renal parenchyma. Degree of hydronephrosis is not substantially changed but lack of FDG excretion on the RIGHT is compatible with secondary sign of physiologic significance of ureteral obstruction. RIGHT pelvic sidewall uptake without visible lesion, could represent tiny lymph node not visible on noncontrast imaging. If the patient is no longer and acute renal failure could consider MRI with and without contrast for further assessment of pelvic findings. Ultimately cystoscopy may be helpful for further assessment, aortic atherosclerosis, pulmonary emphysema.   03/06/2022, patient underwent a bone marrow biopsy. Pathology showed hypercellular bone marrow with plasma cell neoplasm, representing 20% of all cells in the aspirate associated with prominent interstitial infiltrates and numerous predominantly small clusters in the clots in the biopsy sections.  The plasma cells display kappa light chain restriction consistent with plasma cell neoplasm. Normal cytogenetics, myeloma FISH panel positive for 13q deletion, gain of 1q, t (4;14)  Patient received 20 mg dexamethasone x1 after bone marrow biopsy.  03/14/2022, cystoscopy showed diffuse narrowing right distal ureter with hydroureter following up the distal ureter and extending proximally.  Right ureteroscopy showed nodular tumor distal ureter and a convincing area without tumor suspicious for extrinsic obstruction.s/p   right ureteral stent placement. Ureteral tumor showed a tiny fragments of fibrous tissue, interpretation limited by thermal and crush artifact.  Right distal ureter saline barbotage showed hight grade urothelial carcinoma.   INTERVAL HISTORY [name] [name] is  a 81 y.o. male who has above history reviewed by me today presents for follow up visit for multiple myeloma and high-grade urothelial  carcinoma.  Patient has tolerated daratumumab/Velcade,and Revlimid 5 mg 3 weeks on 1 week off.   Patient has developed erythematous adjacent to Velcade injection.  Symptom improved after applying topical steroid.  No nausea vomiting diarrhea + Tinnitus, gabapentin did not help.  + tooth abscess, finished a course of antibiotics, tooth pain is better.  + bilateral ankle swelling.no calf tenderness.     Review of Systems  Constitutional:  Positive for fatigue. Negative for appetite change, chills, fever and unexpected weight change.  HENT:   Negative for hearing loss and voice change.   Eyes:  Negative for eye problems and icterus.  Respiratory:  Negative for chest tightness, cough and shortness of breath.   Cardiovascular:  Negative for chest pain and leg swelling.  Gastrointestinal:  Negative for abdominal distention and abdominal pain.  Endocrine: Negative for hot flashes.  Genitourinary:  Negative for difficulty urinating, dysuria and frequency.   Musculoskeletal:  Negative for arthralgias.  Skin:  Negative for itching and rash.  Neurological:  Negative for light-headedness and numbness.  Hematological:  Negative for adenopathy. Does not bruise/bleed easily.  Psychiatric/Behavioral:  Negative for confusion.      MEDICAL HISTORY:  Past Medical History:  Diagnosis Date   Age related osteoporosis    Anemia    Barrett's esophagus    Bradycardia    Cancer (HCC)    PROSTATE   Cataract    CKD (chronic kidney disease) stage 3, GFR 30-59 ml/min (HCC)    Colon polyps    DDD (degenerative disc disease), cervical    DDD (degenerative disc disease), lumbar    Femur fracture (HCC)    Right   Fundic gland polyposis of stomach    Fundic gland polyps of stomach, benign    Gastritis    GERD (gastroesophageal reflux disease)    Hematuria    Hemorrhage of rectum and anus    History of colon polyps    Insomnia    Lumbago    Osteopenia of the elderly    Skin cancer    Sleep apnea      SURGICAL HISTORY: Past Surgical History:  Procedure Laterality Date   BAND HEMORRHOIDECTOMY     COLONOSCOPY     COLONOSCOPY WITH PROPOFOL N/A 05/06/2018   Procedure: COLONOSCOPY WITH PROPOFOL;  Surgeon: Lollie Sails, MD;  Location: [facility] ENDOSCOPY;  Service: Endoscopy;  Laterality: N/A;   COLONOSCOPY WITH PROPOFOL N/A 09/23/2018   Procedure: COLONOSCOPY WITH PROPOFOL;  Surgeon: Lollie Sails, MD;  Location: [facility] ENDOSCOPY;  Service: Endoscopy;  Laterality: N/A;   COLONOSCOPY WITH PROPOFOL N/A 03/30/2021   Procedure: COLONOSCOPY WITH PROPOFOL;  Surgeon: Toledo, Benay Pike, MD;  Location: [facility] ENDOSCOPY;  Service: Gastroenterology;  Laterality: N/A;   CYSTOSCOPY W/ URETERAL STENT PLACEMENT Right 03/14/2022   Procedure: CYSTOSCOPY WITH RETROGRADE PYELOGRAM/URETERAL STENT PLACEMENT;  Surgeon: Abbie Sons, MD;  Location: [facility] ORS;  Service: Urology;  Laterality: Right;   ESOPHAGOGASTRODUODENOSCOPY (EGD) WITH PROPOFOL N/A 10/03/2016   Procedure: ESOPHAGOGASTRODUODENOSCOPY (EGD) WITH PROPOFOL;  Surgeon: Lollie Sails, MD;  Location: [facility] ENDOSCOPY;  Service: Endoscopy;  Laterality: N/A;   EYE SURGERY     CATARACTS   EYELID SURGERY     FLEXIBLE SIGMOIDOSCOPY     FRACTURE SURGERY Right 2016   femur   FRACTURE SURGERY     ORIF DISTAL FEMUR FRACTURE  TONSILLECTOMY     TRANSURETHRAL RESECTION OF BLADDER TUMOR N/A 03/14/2022   Procedure: TRANSURETHRAL RESECTION OF BLADDER TUMOR (TURBT);  Surgeon: Abbie Sons, MD;  Location: [facility] ORS;  Service: Urology;  Laterality: N/A;    SOCIAL HISTORY: Social History   Socioeconomic History   Marital status: Married    Spouse name: Not on file   Number of children: Not on file   Years of education: Not on file   Highest education level: Not on file  Occupational History   Not on file  Tobacco Use   Smoking status: Former    Packs/day: 1.00    Years: 15.00    Total pack years: 15.00    Types: Cigarettes    Quit date:  10/03/1974    Years since quitting: 47.7   Smokeless tobacco: Never  Vaping Use   Vaping Use: Never used  Substance and Sexual Activity   Alcohol use: Not Currently    Alcohol/week: 1.0 standard drink of alcohol    Types: 1 Cans of beer per week    Comment: 1 beer every 2 weeks   Drug use: No   Sexual activity: Yes    Birth control/protection: None  Other Topics Concern   Not on file  Social History Narrative   ** Merged History Encounter **       Social Determinants of Health   Financial Resource Strain: Not on file  Food Insecurity: Not on file  Transportation Needs: Not on file  Physical Activity: Not on file  Stress: Not on file  Social Connections: Not on file  Intimate Partner Violence: Not on file    FAMILY HISTORY: Family History  Problem Relation Age of Onset   Breast cancer Mother     ALLERGIES:  is allergic to demerol [meperidine hcl], prolia [denosumab], demerol [meperidine], and nsaids.  MEDICATIONS:  Current Outpatient Medications  Medication Sig Dispense Refill   acyclovir (ZOVIRAX) 200 MG capsule Take 1 capsule (200 mg total) by mouth 2 (two) times daily. 60 capsule 3   aspirin EC 81 MG tablet Take 1 tablet (81 mg total) by mouth daily. Swallow whole. 30 tablet 11   Calcium Carbonate (CALCIUM 500 PO) Take 1 tablet by mouth daily.     Cholecalciferol (VITAMIN D3) 125 MCG (5000 UT) CAPS Take 5,000 Units by mouth daily.     ferrous sulfate 325 (65 FE) MG tablet Take 325 mg by mouth daily.     gabapentin (NEURONTIN) 300 MG capsule Take 1 capsule (300 mg total) by mouth at bedtime. 30 capsule 3   HYDROcodone-acetaminophen (NORCO) 7.5-325 MG tablet 0.5-1 tab every 6 hours as needed for pain 8 tablet 0   ipratropium (ATROVENT) 0.03 % nasal spray Place 1 spray into both nostrils in the morning.     lenalidomide (REVLIMID) 5 MG capsule Take 1 capsule (5 mg total) by mouth See admin instructions. TAKE 1 CAPSULE BY MOUTH ONCE DAILY FOR 21 DAYS ON AND 7 DAYS OFF  21 capsule 0   losartan (COZAAR) 25 MG tablet Take 25 mg by mouth daily.     melatonin 5 MG TABS Take 5 mg by mouth at bedtime.     montelukast (SINGULAIR) 10 MG tablet Take 1 tablet (10 mg total) by mouth See admin instructions. Take 1 tablet the day prior to Daratumumab treatments, and take 1 tablet daily for 2 days after treatments 30 tablet 1   Multiple Vitamins-Minerals (MENS 50+ MULTIVITAMIN) TABS Take 1 tablet by mouth daily.  ondansetron (ZOFRAN) 8 MG tablet Take 1 tablet (8 mg total) by mouth 2 (two) times daily as needed (Nausea or vomiting). 30 tablet 1   senna (SENOKOT) 8.6 MG TABS tablet Take 2 tablets (17.2 mg total) by mouth daily. 120 tablet 0   traZODone (DESYREL) 50 MG tablet Take 25 mg by mouth at bedtime.     triamcinolone ointment (KENALOG) 0.5 % Apply 1 application. topically 2 (two) times daily. 30 g 0   amoxicillin (AMOXIL) 500 MG capsule Take 500 mg by mouth every 6 (six) hours.     No current facility-administered medications for this visit.     PHYSICAL EXAMINATION: ECOG PERFORMANCE STATUS: 1 - Symptomatic but completely ambulatory Today's Vitals   06/21/22 1309  BP: (!) 128/47  Pulse: (!) 41  Resp: 18  Temp: (!) 96.9 F (36.1 C)  Weight: 154 lb 12.8 oz (70.2 kg)  PainSc: 0-No pain   Body mass index is 24.25 kg/m.   Physical Exam Constitutional:      General: He is not in acute distress. HENT:     Head: Normocephalic and atraumatic.  Eyes:     General: No scleral icterus. Cardiovascular:     Rate and Rhythm: Normal rate and regular rhythm.     Heart sounds: Normal heart sounds.  Pulmonary:     Effort: Pulmonary effort is normal. No respiratory distress.     Breath sounds: No wheezing.  Abdominal:     General: Bowel sounds are normal. There is no distension.     Palpations: Abdomen is soft.  Musculoskeletal:        General: No deformity. Normal range of motion.     Cervical back: Normal range of motion and neck supple.  Skin:    General:  Skin is warm and dry.     Findings: No erythema or rash.  Neurological:     Mental Status: He is alert and oriented to person, place, and time. Mental status is at baseline.     Cranial Nerves: No cranial nerve deficit.     Coordination: Coordination normal.  Psychiatric:        Mood and Affect: Mood normal.      LABORATORY DATA:  I have reviewed the data as listed Lab Results  Component Value Date   WBC 4.1 06/21/2022   HGB 10.1 (L) 06/21/2022   HCT 31.0 (L) 06/21/2022   MCV 96.6 06/21/2022   PLT 166 06/21/2022   Recent Labs    06/07/22 1259 06/14/22 1305 06/21/22 1159  NA 136 135 139  K 4.5 4.1 4.4  CL 101 102 108  CO2 _0 GLUCOSE 98 112* 109*  BUN 44* 51* 38*  CREATININE 2.91* 3.05* 2.71*  CALCIUM 8.0* 8.2* 8.1*  GFRNONAA 21* 20* 23*  PROT 5.6* 5.9* 5.3*  ALBUMIN 3.2* 3.4* 3.0*  AST _1 ALT _2 ALKPHOS 62 68 62  BILITOT 0.6 0.5 0.5    Iron/TIBC/Ferritin/ %Sat    Component Value Date/Time   IRON 60 02/23/2022 1523   TIBC 238 (L) 02/23/2022 1523   FERRITIN 86 02/23/2022 1523   IRONPCTSAT 25 02/23/2022 1523       RADIOGRAPHIC STUDIES: I have personally reviewed the radiological images as listed and agreed with the findings in the report.  DG Chest 2 View  Result Date: 05/16/2022 CLINICAL DATA:  Cough and wheezing. EXAM: CHEST - 2 VIEW COMPARISON:  June 23, 2021 FINDINGS: The heart size and mediastinal  contours are within normal limits. Mild patchy opacities identified in the left lung base. The right lung is clear. There is no pleural effusion or pulmonary edema. Chronic deformity of the right clavicle is noted. IMPRESSION: Mild patchy opacities identified in the left lung base, developing pneumonia is not excluded. Electronically Signed   By: Abelardo Diesel M.D.   On: 05/16/2022 10:37   DG HIP UNILAT WITH PELVIS 2-3 VIEWS LEFT  Result Date: 04/19/2022 CLINICAL DATA:  Bilateral hip pain.  History of multiple myeloma. EXAM: DG HIP (WITH  OR WITHOUT PELVIS) 2-3V LEFT; DG HIP (WITH OR WITHOUT PELVIS) 2-3V RIGHT COMPARISON:  PET-CT March 02, 2022 FINDINGS: There is no evidence of hip fracture or dislocation. Degenerative joint changes of bilateral hips with narrowed joint space and osteophyte formation are noted. Status post prior pinning of proximal right femur. No focal discrete lytic lesion is identified in the visualized bony structures. Right ureteral catheter is noted. Bilateral pelvic phleboliths are noted. IMPRESSION: No acute fracture or dislocation. Degenerative joint changes of bilateral hips. No focal lesions identified in bilateral hips. Electronically Signed   By: Abelardo Diesel M.D.   On: 04/19/2022 13:35   DG HIP UNILAT WITH PELVIS 2-3 VIEWS RIGHT  Result Date: 04/19/2022 CLINICAL DATA:  Bilateral hip pain.  History of multiple myeloma. EXAM: DG HIP (WITH OR WITHOUT PELVIS) 2-3V LEFT; DG HIP (WITH OR WITHOUT PELVIS) 2-3V RIGHT COMPARISON:  PET-CT March 02, 2022 FINDINGS: There is no evidence of hip fracture or dislocation. Degenerative joint changes of bilateral hips with narrowed joint space and osteophyte formation are noted. Status post prior pinning of proximal right femur. No focal discrete lytic lesion is identified in the visualized bony structures. Right ureteral catheter is noted. Bilateral pelvic phleboliths are noted. IMPRESSION: No acute fracture or dislocation. Degenerative joint changes of bilateral hips. No focal lesions identified in bilateral hips. Electronically Signed   By: Abelardo Diesel M.D.   On: 04/19/2022 13:35

## 2022-06-21 NOTE — Assessment & Plan Note (Signed)
Chemotherapy plan as listed above 

## 2022-06-22 ENCOUNTER — Ambulatory Visit
Admission: RE | Admit: 2022-06-22 | Discharge: 2022-06-22 | Disposition: A | Discharge status: Home / Self Care | Payer: Medicare Other | Source: Ambulatory Visit | Attending: Radiation Oncology | Admitting: Radiation Oncology

## 2022-06-22 ENCOUNTER — Other Ambulatory Visit: Payer: Self-pay

## 2022-06-22 DIAGNOSIS — Z510 Encounter for antineoplastic radiation therapy: Secondary | ICD-10-CM | POA: Diagnosis not present

## 2022-06-22 LAB — RAD ONC ARIA SESSION SUMMARY
Course Elapsed Days: 3
Plan Fractions Treated to Date: 4
Plan Prescribed Dose Per Fraction: 1.8 Gy
Plan Total Fractions Prescribed: 25
Plan Total Prescribed Dose: 45 Gy
Reference Point Dosage Given to Date: 7.2 Gy
Reference Point Session Dosage Given: 1.8 Gy
Session Number: 4

## 2022-06-23 ENCOUNTER — Other Ambulatory Visit: Payer: Self-pay

## 2022-06-23 ENCOUNTER — Ambulatory Visit
Admission: RE | Admit: 2022-06-23 | Discharge: 2022-06-23 | Disposition: A | Discharge status: Home / Self Care | Payer: Medicare Other | Source: Ambulatory Visit | Attending: Radiation Oncology | Admitting: Radiation Oncology

## 2022-06-23 DIAGNOSIS — Z510 Encounter for antineoplastic radiation therapy: Secondary | ICD-10-CM | POA: Diagnosis not present

## 2022-06-23 LAB — RAD ONC ARIA SESSION SUMMARY
Course Elapsed Days: 4
Plan Fractions Treated to Date: 5
Plan Prescribed Dose Per Fraction: 1.8 Gy
Plan Total Fractions Prescribed: 25
Plan Total Prescribed Dose: 45 Gy
Reference Point Dosage Given to Date: 9 Gy
Reference Point Session Dosage Given: 1.8 Gy
Session Number: 5

## 2022-06-26 ENCOUNTER — Ambulatory Visit
Admission: RE | Admit: 2022-06-26 | Discharge: 2022-06-26 | Disposition: A | Discharge status: Home / Self Care | Payer: Medicare Other | Source: Ambulatory Visit | Attending: Radiation Oncology | Admitting: Radiation Oncology

## 2022-06-26 ENCOUNTER — Telehealth: Payer: Self-pay | Admitting: *Deleted

## 2022-06-26 ENCOUNTER — Other Ambulatory Visit: Payer: Self-pay

## 2022-06-26 ENCOUNTER — Ambulatory Visit: Payer: Medicare Other

## 2022-06-26 DIAGNOSIS — Z510 Encounter for antineoplastic radiation therapy: Secondary | ICD-10-CM | POA: Diagnosis not present

## 2022-06-26 LAB — RAD ONC ARIA SESSION SUMMARY
Course Elapsed Days: 7
Plan Fractions Treated to Date: 6
Plan Prescribed Dose Per Fraction: 1.8 Gy
Plan Total Fractions Prescribed: 25
Plan Total Prescribed Dose: 45 Gy
Reference Point Dosage Given to Date: 10.8 Gy
Reference Point Session Dosage Given: 1.8 Gy
Session Number: 6

## 2022-06-26 NOTE — Telephone Encounter (Signed)
Patient called wanting to know if [facility] sent his chemotherapy plan to you, states he called them to send it. Please let him know if you received it or not.

## 2022-06-27 ENCOUNTER — Encounter: Payer: Self-pay | Admitting: Oncology

## 2022-06-27 ENCOUNTER — Ambulatory Visit
Admission: RE | Admit: 2022-06-27 | Discharge: 2022-06-27 | Disposition: A | Discharge status: Home / Self Care | Payer: Medicare Other | Source: Ambulatory Visit | Attending: Radiation Oncology | Admitting: Radiation Oncology

## 2022-06-27 ENCOUNTER — Other Ambulatory Visit: Payer: Self-pay

## 2022-06-27 DIAGNOSIS — Z510 Encounter for antineoplastic radiation therapy: Secondary | ICD-10-CM | POA: Diagnosis not present

## 2022-06-27 LAB — RAD ONC ARIA SESSION SUMMARY
Course Elapsed Days: 8
Plan Fractions Treated to Date: 7
Plan Prescribed Dose Per Fraction: 1.8 Gy
Plan Total Fractions Prescribed: 25
Plan Total Prescribed Dose: 45 Gy
Reference Point Dosage Given to Date: 12.6 Gy
Reference Point Session Dosage Given: 1.8 Gy
Session Number: 7

## 2022-06-27 NOTE — Telephone Encounter (Signed)
Dr. Tasia Catchings has talked to Dr. Vito Berger and per conversation and per Dr. Tasia Catchings "no need to do chemo with Radiation. please let patient know that we talked and radiation alone is fine. no change of his plan "  Pt informed of plan, but would like to talk/ see Dr. Tasia Catchings to further discuss.

## 2022-06-27 NOTE — Telephone Encounter (Signed)
Called [facility] and left message for Sonia Baller (nurse navigator) to return call. Dr. Tasia Catchings wanting to know Urologist recommendation regarding radiation or chemo (Gemcitabine) with radiation. Also provided Dr. Collie Siad ph number if Dr. Vito Berger would like to call Dr. Tasia Catchings with recommendation.

## 2022-06-28 ENCOUNTER — Ambulatory Visit
Admission: RE | Admit: 2022-06-28 | Discharge: 2022-06-28 | Disposition: A | Discharge status: Home / Self Care | Payer: Medicare Other | Source: Ambulatory Visit | Attending: Radiation Oncology | Admitting: Radiation Oncology

## 2022-06-28 ENCOUNTER — Other Ambulatory Visit: Payer: Self-pay

## 2022-06-28 ENCOUNTER — Ambulatory Visit: Payer: Medicare Other

## 2022-06-28 ENCOUNTER — Ambulatory Visit
Admission: RE | Admit: 2022-06-28 | Discharge: 2022-06-28 | Disposition: A | Discharge status: Home / Self Care | Payer: Medicare Other | Source: Ambulatory Visit | Attending: Oncology | Admitting: Oncology

## 2022-06-28 DIAGNOSIS — M25473 Effusion, unspecified ankle: Secondary | ICD-10-CM | POA: Insufficient documentation

## 2022-06-28 DIAGNOSIS — Z510 Encounter for antineoplastic radiation therapy: Secondary | ICD-10-CM | POA: Diagnosis not present

## 2022-06-28 LAB — RAD ONC ARIA SESSION SUMMARY
Course Elapsed Days: 9
Plan Fractions Treated to Date: 8
Plan Prescribed Dose Per Fraction: 1.8 Gy
Plan Total Fractions Prescribed: 25
Plan Total Prescribed Dose: 45 Gy
Reference Point Dosage Given to Date: 14.4 Gy
Reference Point Session Dosage Given: 1.8 Gy
Session Number: 8

## 2022-06-29 ENCOUNTER — Other Ambulatory Visit: Payer: Self-pay

## 2022-06-29 ENCOUNTER — Ambulatory Visit
Admission: RE | Admit: 2022-06-29 | Discharge: 2022-06-29 | Disposition: A | Discharge status: Home / Self Care | Payer: Medicare Other | Source: Ambulatory Visit | Attending: Radiation Oncology | Admitting: Radiation Oncology

## 2022-06-29 DIAGNOSIS — Z510 Encounter for antineoplastic radiation therapy: Secondary | ICD-10-CM | POA: Diagnosis not present

## 2022-06-29 LAB — RAD ONC ARIA SESSION SUMMARY
Course Elapsed Days: 10
Plan Fractions Treated to Date: 9
Plan Prescribed Dose Per Fraction: 1.8 Gy
Plan Total Fractions Prescribed: 25
Plan Total Prescribed Dose: 45 Gy
Reference Point Dosage Given to Date: 16.2 Gy
Reference Point Session Dosage Given: 1.8 Gy
Session Number: 9

## 2022-06-30 ENCOUNTER — Ambulatory Visit
Admission: RE | Admit: 2022-06-30 | Discharge: 2022-06-30 | Disposition: A | Discharge status: Home / Self Care | Payer: Medicare Other | Source: Ambulatory Visit | Attending: Radiation Oncology | Admitting: Radiation Oncology

## 2022-06-30 ENCOUNTER — Other Ambulatory Visit: Payer: Self-pay

## 2022-06-30 DIAGNOSIS — Z510 Encounter for antineoplastic radiation therapy: Secondary | ICD-10-CM | POA: Diagnosis not present

## 2022-06-30 LAB — RAD ONC ARIA SESSION SUMMARY
Course Elapsed Days: 11
Plan Fractions Treated to Date: 10
Plan Prescribed Dose Per Fraction: 1.8 Gy
Plan Total Fractions Prescribed: 25
Plan Total Prescribed Dose: 45 Gy
Reference Point Dosage Given to Date: 18 Gy
Reference Point Session Dosage Given: 1.8 Gy
Session Number: 10

## 2022-07-03 ENCOUNTER — Other Ambulatory Visit: Payer: Self-pay

## 2022-07-03 ENCOUNTER — Ambulatory Visit
Admission: RE | Admit: 2022-07-03 | Discharge: 2022-07-03 | Disposition: A | Discharge status: Home / Self Care | Payer: Medicare Other | Source: Ambulatory Visit | Attending: Radiation Oncology | Admitting: Radiation Oncology

## 2022-07-03 ENCOUNTER — Ambulatory Visit: Payer: Medicare Other

## 2022-07-03 DIAGNOSIS — Z510 Encounter for antineoplastic radiation therapy: Secondary | ICD-10-CM | POA: Diagnosis not present

## 2022-07-03 LAB — RAD ONC ARIA SESSION SUMMARY
Course Elapsed Days: 14
Plan Fractions Treated to Date: 11
Plan Prescribed Dose Per Fraction: 1.8 Gy
Plan Total Fractions Prescribed: 25
Plan Total Prescribed Dose: 45 Gy
Reference Point Dosage Given to Date: 19.8 Gy
Reference Point Session Dosage Given: 1.8 Gy
Session Number: 11

## 2022-07-04 ENCOUNTER — Ambulatory Visit
Admission: RE | Admit: 2022-07-04 | Discharge: 2022-07-04 | Disposition: A | Discharge status: Home / Self Care | Payer: Medicare Other | Source: Ambulatory Visit | Attending: Radiation Oncology | Admitting: Radiation Oncology

## 2022-07-04 ENCOUNTER — Other Ambulatory Visit: Payer: Self-pay

## 2022-07-04 DIAGNOSIS — C661 Malignant neoplasm of right ureter: Secondary | ICD-10-CM | POA: Insufficient documentation

## 2022-07-04 DIAGNOSIS — Z510 Encounter for antineoplastic radiation therapy: Secondary | ICD-10-CM | POA: Diagnosis present

## 2022-07-04 DIAGNOSIS — C61 Malignant neoplasm of prostate: Secondary | ICD-10-CM | POA: Insufficient documentation

## 2022-07-04 DIAGNOSIS — C9000 Multiple myeloma not having achieved remission: Secondary | ICD-10-CM | POA: Diagnosis not present

## 2022-07-04 DIAGNOSIS — Z5112 Encounter for antineoplastic immunotherapy: Secondary | ICD-10-CM | POA: Insufficient documentation

## 2022-07-04 LAB — RAD ONC ARIA SESSION SUMMARY
Course Elapsed Days: 15
Plan Fractions Treated to Date: 12
Plan Prescribed Dose Per Fraction: 1.8 Gy
Plan Total Fractions Prescribed: 25
Plan Total Prescribed Dose: 45 Gy
Reference Point Dosage Given to Date: 21.6 Gy
Reference Point Session Dosage Given: 1.8 Gy
Session Number: 12

## 2022-07-05 ENCOUNTER — Inpatient Hospital Stay: Payer: Medicare Other

## 2022-07-05 ENCOUNTER — Ambulatory Visit
Admission: RE | Admit: 2022-07-05 | Discharge: 2022-07-05 | Disposition: A | Discharge status: Home / Self Care | Payer: Medicare Other | Source: Ambulatory Visit | Attending: Radiation Oncology | Admitting: Radiation Oncology

## 2022-07-05 ENCOUNTER — Other Ambulatory Visit: Payer: Self-pay

## 2022-07-05 VITALS — BP 123/48 | HR 78 | Temp 97.6°F | Resp 18 | Wt 146.4 lb

## 2022-07-05 DIAGNOSIS — D631 Anemia in chronic kidney disease: Secondary | ICD-10-CM | POA: Insufficient documentation

## 2022-07-05 DIAGNOSIS — G4730 Sleep apnea, unspecified: Secondary | ICD-10-CM | POA: Insufficient documentation

## 2022-07-05 DIAGNOSIS — C679 Malignant neoplasm of bladder, unspecified: Secondary | ICD-10-CM | POA: Insufficient documentation

## 2022-07-05 DIAGNOSIS — C9000 Multiple myeloma not having achieved remission: Secondary | ICD-10-CM

## 2022-07-05 DIAGNOSIS — Z7961 Long term (current) use of immunomodulator: Secondary | ICD-10-CM | POA: Insufficient documentation

## 2022-07-05 DIAGNOSIS — Z86010 Personal history of colonic polyps: Secondary | ICD-10-CM | POA: Insufficient documentation

## 2022-07-05 DIAGNOSIS — Z87891 Personal history of nicotine dependence: Secondary | ICD-10-CM | POA: Insufficient documentation

## 2022-07-05 DIAGNOSIS — M5030 Other cervical disc degeneration, unspecified cervical region: Secondary | ICD-10-CM | POA: Insufficient documentation

## 2022-07-05 DIAGNOSIS — N184 Chronic kidney disease, stage 4 (severe): Secondary | ICD-10-CM | POA: Insufficient documentation

## 2022-07-05 DIAGNOSIS — K047 Periapical abscess without sinus: Secondary | ICD-10-CM | POA: Insufficient documentation

## 2022-07-05 DIAGNOSIS — R197 Diarrhea, unspecified: Secondary | ICD-10-CM | POA: Insufficient documentation

## 2022-07-05 DIAGNOSIS — Z923 Personal history of irradiation: Secondary | ICD-10-CM | POA: Insufficient documentation

## 2022-07-05 DIAGNOSIS — Z7982 Long term (current) use of aspirin: Secondary | ICD-10-CM | POA: Insufficient documentation

## 2022-07-05 DIAGNOSIS — Z510 Encounter for antineoplastic radiation therapy: Secondary | ICD-10-CM | POA: Diagnosis not present

## 2022-07-05 DIAGNOSIS — Z5112 Encounter for antineoplastic immunotherapy: Secondary | ICD-10-CM | POA: Insufficient documentation

## 2022-07-05 DIAGNOSIS — Z79899 Other long term (current) drug therapy: Secondary | ICD-10-CM | POA: Insufficient documentation

## 2022-07-05 DIAGNOSIS — K219 Gastro-esophageal reflux disease without esophagitis: Secondary | ICD-10-CM | POA: Insufficient documentation

## 2022-07-05 LAB — CBC WITH DIFFERENTIAL/PLATELET
Abs Immature Granulocytes: 0.02 10*3/uL (ref 0.00–0.07)
Basophils Absolute: 0 10*3/uL (ref 0.0–0.1)
Basophils Relative: 1 %
Eosinophils Absolute: 0.3 10*3/uL (ref 0.0–0.5)
Eosinophils Relative: 7 %
HCT: 32.5 % — ABNORMAL LOW (ref 39.0–52.0)
Hemoglobin: 10.6 g/dL — ABNORMAL LOW (ref 13.0–17.0)
Immature Granulocytes: 1 %
Lymphocytes Relative: 12 %
Lymphs Abs: 0.5 10*3/uL — ABNORMAL LOW (ref 0.7–4.0)
MCH: 31.8 pg (ref 26.0–34.0)
MCHC: 32.6 g/dL (ref 30.0–36.0)
MCV: 97.6 fL (ref 80.0–100.0)
Monocytes Absolute: 0.6 10*3/uL (ref 0.1–1.0)
Monocytes Relative: 14 %
Neutro Abs: 2.9 10*3/uL (ref 1.7–7.7)
Neutrophils Relative %: 65 %
Platelets: 153 10*3/uL (ref 150–400)
RBC: 3.33 MIL/uL — ABNORMAL LOW (ref 4.22–5.81)
RDW: 14.3 % (ref 11.5–15.5)
WBC: 4.4 10*3/uL (ref 4.0–10.5)
nRBC: 0 % (ref 0.0–0.2)

## 2022-07-05 LAB — RAD ONC ARIA SESSION SUMMARY
Course Elapsed Days: 16
Plan Fractions Treated to Date: 13
Plan Prescribed Dose Per Fraction: 1.8 Gy
Plan Total Fractions Prescribed: 25
Plan Total Prescribed Dose: 45 Gy
Reference Point Dosage Given to Date: 23.4 Gy
Reference Point Session Dosage Given: 1.8 Gy
Session Number: 13

## 2022-07-05 LAB — COMPREHENSIVE METABOLIC PANEL
ALT: 17 U/L (ref 0–44)
AST: 17 U/L (ref 15–41)
Albumin: 3.5 g/dL (ref 3.5–5.0)
Alkaline Phosphatase: 64 U/L (ref 38–126)
Anion gap: 5 (ref 5–15)
BUN: 40 mg/dL — ABNORMAL HIGH (ref 8–23)
CO2: 24 mmol/L (ref 22–32)
Calcium: 8.5 mg/dL — ABNORMAL LOW (ref 8.9–10.3)
Chloride: 109 mmol/L (ref 98–111)
Creatinine, Ser: 2.71 mg/dL — ABNORMAL HIGH (ref 0.61–1.24)
GFR, Estimated: 23 mL/min — ABNORMAL LOW (ref >60)
Glucose, Bld: 117 mg/dL — ABNORMAL HIGH (ref 70–99)
Potassium: 4.7 mmol/L (ref 3.5–5.1)
Sodium: 138 mmol/L (ref 135–145)
Total Bilirubin: 0.4 mg/dL (ref 0.3–1.2)
Total Protein: 6 g/dL — ABNORMAL LOW (ref 6.5–8.1)

## 2022-07-05 MED ORDER — ACETAMINOPHEN 325 MG PO TABS
650.0000 mg | ORAL_TABLET | Freq: Once | ORAL | Status: AC
  Administered 2022-07-05: 650 mg via ORAL
  Filled 2022-07-05: qty 2

## 2022-07-05 MED ORDER — DIPHENHYDRAMINE HCL 25 MG PO CAPS
50.0000 mg | ORAL_CAPSULE | Freq: Once | ORAL | Status: AC
  Administered 2022-07-05: 50 mg via ORAL
  Filled 2022-07-05: qty 2

## 2022-07-05 MED ORDER — DARATUMUMAB-HYALURONIDASE-FIHJ 1800-30000 MG-UT/15ML ~~LOC~~ SOLN
1800.0000 mg | Freq: Once | SUBCUTANEOUS | Status: AC
  Administered 2022-07-05: 1800 mg via SUBCUTANEOUS
  Filled 2022-07-05: qty 15

## 2022-07-05 MED ORDER — DEXAMETHASONE 4 MG PO TABS
20.0000 mg | ORAL_TABLET | Freq: Once | ORAL | Status: AC
  Administered 2022-07-05: 20 mg via ORAL
  Filled 2022-07-05: qty 5

## 2022-07-05 NOTE — Patient Instructions (Signed)
[facility]  Discharge Instructions: Thank you for choosing [facility] to provide your oncology and hematology care.  If you have a lab appointment with the Cancer Center, please go directly to the Cancer Center and check in at the registration area.  Wear comfortable clothing and clothing appropriate for easy access to any Portacath or PICC line.   We strive to give you quality time with your provider. You may need to reschedule your appointment if you arrive late (15 or more minutes).  Arriving late affects you and other patients whose appointments are after yours.  Also, if you miss three or more appointments without notifying the office, you may be dismissed from the clinic at the provider's discretion.      For prescription refill requests, have your pharmacy contact our office and allow 72 hours for refills to be completed.    Today you received the following chemotherapy and/or immunotherapy agents daratumumab      To help prevent nausea and vomiting after your treatment, we encourage you to take your nausea medication as directed.  BELOW ARE SYMPTOMS THAT SHOULD BE REPORTED IMMEDIATELY: *FEVER GREATER THAN 100.4 F (38 C) OR HIGHER *CHILLS OR SWEATING *NAUSEA AND VOMITING THAT IS NOT CONTROLLED WITH YOUR NAUSEA MEDICATION *UNUSUAL SHORTNESS OF BREATH *UNUSUAL BRUISING OR BLEEDING *URINARY PROBLEMS (pain or burning when urinating, or frequent urination) *BOWEL PROBLEMS (unusual diarrhea, constipation, pain near the anus) TENDERNESS IN MOUTH AND THROAT WITH OR WITHOUT PRESENCE OF ULCERS (sore throat, sores in mouth, or a toothache) UNUSUAL RASH, SWELLING OR PAIN  UNUSUAL VAGINAL DISCHARGE OR ITCHING   Items with * indicate a potential emergency and should be followed up as soon as possible or go to the Emergency Department if any problems should occur.  Please show the CHEMOTHERAPY ALERT CARD or IMMUNOTHERAPY ALERT CARD at check-in to  the Emergency Department and triage nurse.  Should you have questions after your visit or need to cancel or reschedule your appointment, please contact [facility]  [phone] and follow the prompts.  Office hours are 8:00 a.m. to 4:30 p.m. Monday - Friday. Please note that voicemails left after 4:00 p.m. may not be returned until the following business day.  We are closed weekends and major holidays. You have access to a nurse at all times for urgent questions. Please call the main number to the clinic [phone] and follow the prompts.  For any non-urgent questions, you may also contact your provider using MyChart. We now offer e-Visits for anyone 18 and older to request care online for non-urgent symptoms. For details visit [email or web address].   Also download the MyChart app! Go to the app store, search "MyChart", open the app, select [place], and log in with your MyChart username and password.  Masks are optional in the cancer centers. If you would like for your care team to wear a mask while they are taking care of you, please let them know. For doctor visits, patients may have with them one support person who is at least 83 years old. At this time, visitors are not allowed in the infusion area.   

## 2022-07-05 NOTE — Progress Notes (Signed)
Patient tolerated  daratumumab injection today, no questions/concerns voiced. Monitored post injection. Stable at discharge. AVS given.

## 2022-07-06 ENCOUNTER — Ambulatory Visit
Admission: RE | Admit: 2022-07-06 | Discharge: 2022-07-06 | Disposition: A | Discharge status: Home / Self Care | Payer: Medicare Other | Source: Ambulatory Visit | Attending: Radiation Oncology | Admitting: Radiation Oncology

## 2022-07-06 ENCOUNTER — Other Ambulatory Visit: Payer: Self-pay

## 2022-07-06 DIAGNOSIS — Z510 Encounter for antineoplastic radiation therapy: Secondary | ICD-10-CM | POA: Diagnosis not present

## 2022-07-06 LAB — RAD ONC ARIA SESSION SUMMARY
Course Elapsed Days: 17
Plan Fractions Treated to Date: 14
Plan Prescribed Dose Per Fraction: 1.8 Gy
Plan Total Fractions Prescribed: 25
Plan Total Prescribed Dose: 45 Gy
Reference Point Dosage Given to Date: 25.2 Gy
Reference Point Session Dosage Given: 1.8 Gy
Session Number: 14

## 2022-07-07 ENCOUNTER — Ambulatory Visit
Admission: RE | Admit: 2022-07-07 | Discharge: 2022-07-07 | Disposition: A | Discharge status: Home / Self Care | Payer: Medicare Other | Source: Ambulatory Visit | Attending: Radiation Oncology | Admitting: Radiation Oncology

## 2022-07-07 ENCOUNTER — Other Ambulatory Visit: Payer: Self-pay

## 2022-07-07 DIAGNOSIS — Z510 Encounter for antineoplastic radiation therapy: Secondary | ICD-10-CM | POA: Diagnosis not present

## 2022-07-07 LAB — RAD ONC ARIA SESSION SUMMARY
Course Elapsed Days: 18
Plan Fractions Treated to Date: 15
Plan Prescribed Dose Per Fraction: 1.8 Gy
Plan Total Fractions Prescribed: 25
Plan Total Prescribed Dose: 45 Gy
Reference Point Dosage Given to Date: 27 Gy
Reference Point Session Dosage Given: 1.8 Gy
Session Number: 15

## 2022-07-10 ENCOUNTER — Other Ambulatory Visit: Payer: Self-pay

## 2022-07-10 ENCOUNTER — Ambulatory Visit
Admission: RE | Admit: 2022-07-10 | Discharge: 2022-07-10 | Disposition: A | Discharge status: Home / Self Care | Payer: Medicare Other | Source: Ambulatory Visit | Attending: Radiation Oncology | Admitting: Radiation Oncology

## 2022-07-10 DIAGNOSIS — Z510 Encounter for antineoplastic radiation therapy: Secondary | ICD-10-CM | POA: Diagnosis not present

## 2022-07-10 LAB — RAD ONC ARIA SESSION SUMMARY
Course Elapsed Days: 21
Plan Fractions Treated to Date: 16
Plan Prescribed Dose Per Fraction: 1.8 Gy
Plan Total Fractions Prescribed: 25
Plan Total Prescribed Dose: 45 Gy
Reference Point Dosage Given to Date: 28.8 Gy
Reference Point Session Dosage Given: 1.8 Gy
Session Number: 16

## 2022-07-11 ENCOUNTER — Other Ambulatory Visit: Payer: Self-pay | Admitting: *Deleted

## 2022-07-11 ENCOUNTER — Ambulatory Visit
Admission: RE | Admit: 2022-07-11 | Discharge: 2022-07-11 | Disposition: A | Discharge status: Home / Self Care | Payer: Medicare Other | Source: Ambulatory Visit | Attending: Radiation Oncology | Admitting: Radiation Oncology

## 2022-07-11 ENCOUNTER — Other Ambulatory Visit: Payer: Self-pay

## 2022-07-11 DIAGNOSIS — Z510 Encounter for antineoplastic radiation therapy: Secondary | ICD-10-CM | POA: Diagnosis not present

## 2022-07-11 LAB — RAD ONC ARIA SESSION SUMMARY
Course Elapsed Days: 22
Plan Fractions Treated to Date: 17
Plan Prescribed Dose Per Fraction: 1.8 Gy
Plan Total Fractions Prescribed: 25
Plan Total Prescribed Dose: 45 Gy
Reference Point Dosage Given to Date: 30.6 Gy
Reference Point Session Dosage Given: 1.8 Gy
Session Number: 17

## 2022-07-11 MED ORDER — MIRABEGRON ER 25 MG PO TB24: 25.0000 mg | ORAL_TABLET | Freq: Every day | ORAL | 1 refills | Status: DC

## 2022-07-12 ENCOUNTER — Ambulatory Visit
Admission: RE | Admit: 2022-07-12 | Discharge: 2022-07-12 | Disposition: A | Discharge status: Home / Self Care | Payer: Medicare Other | Source: Ambulatory Visit | Attending: Radiation Oncology | Admitting: Radiation Oncology

## 2022-07-12 ENCOUNTER — Telehealth: Payer: Self-pay | Admitting: *Deleted

## 2022-07-12 ENCOUNTER — Other Ambulatory Visit: Payer: Self-pay

## 2022-07-12 DIAGNOSIS — Z510 Encounter for antineoplastic radiation therapy: Secondary | ICD-10-CM | POA: Diagnosis not present

## 2022-07-12 LAB — RAD ONC ARIA SESSION SUMMARY
Course Elapsed Days: 23
Plan Fractions Treated to Date: 18
Plan Prescribed Dose Per Fraction: 1.8 Gy
Plan Total Fractions Prescribed: 25
Plan Total Prescribed Dose: 45 Gy
Reference Point Dosage Given to Date: 32.4 Gy
Reference Point Session Dosage Given: 1.8 Gy
Session Number: 18

## 2022-07-12 NOTE — Telephone Encounter (Signed)
Called patient to advise him the medication ordered yesterday requires prior-authorization. Will inform him when medication is at pharmacy.

## 2022-07-13 ENCOUNTER — Other Ambulatory Visit: Payer: Self-pay | Admitting: *Deleted

## 2022-07-13 ENCOUNTER — Other Ambulatory Visit: Payer: Self-pay

## 2022-07-13 ENCOUNTER — Ambulatory Visit
Admission: RE | Admit: 2022-07-13 | Discharge: 2022-07-13 | Disposition: A | Discharge status: Home / Self Care | Payer: Medicare Other | Source: Ambulatory Visit | Attending: Radiation Oncology | Admitting: Radiation Oncology

## 2022-07-13 ENCOUNTER — Other Ambulatory Visit: Payer: Self-pay | Admitting: Radiation Oncology

## 2022-07-13 DIAGNOSIS — Z510 Encounter for antineoplastic radiation therapy: Secondary | ICD-10-CM | POA: Diagnosis not present

## 2022-07-13 LAB — RAD ONC ARIA SESSION SUMMARY
Course Elapsed Days: 24
Plan Fractions Treated to Date: 19
Plan Prescribed Dose Per Fraction: 1.8 Gy
Plan Total Fractions Prescribed: 25
Plan Total Prescribed Dose: 45 Gy
Reference Point Dosage Given to Date: 34.2 Gy
Reference Point Session Dosage Given: 1.8 Gy
Session Number: 19

## 2022-07-13 MED ORDER — OXYBUTYNIN CHLORIDE ER 10 MG PO TB24: 10.0000 mg | ORAL_TABLET | Freq: Every day | ORAL | 1 refills | Status: DC

## 2022-07-14 ENCOUNTER — Other Ambulatory Visit: Payer: Self-pay

## 2022-07-14 ENCOUNTER — Ambulatory Visit
Admission: RE | Admit: 2022-07-14 | Discharge: 2022-07-14 | Disposition: A | Discharge status: Home / Self Care | Payer: Medicare Other | Source: Ambulatory Visit | Attending: Radiation Oncology | Admitting: Radiation Oncology

## 2022-07-14 DIAGNOSIS — Z510 Encounter for antineoplastic radiation therapy: Secondary | ICD-10-CM | POA: Diagnosis not present

## 2022-07-14 LAB — RAD ONC ARIA SESSION SUMMARY
Course Elapsed Days: 25
Plan Fractions Treated to Date: 20
Plan Prescribed Dose Per Fraction: 1.8 Gy
Plan Total Fractions Prescribed: 25
Plan Total Prescribed Dose: 45 Gy
Reference Point Dosage Given to Date: 36 Gy
Reference Point Session Dosage Given: 1.8 Gy
Session Number: 20

## 2022-07-17 ENCOUNTER — Other Ambulatory Visit: Payer: Self-pay

## 2022-07-17 ENCOUNTER — Ambulatory Visit
Admission: RE | Admit: 2022-07-17 | Discharge: 2022-07-17 | Disposition: A | Discharge status: Home / Self Care | Payer: Medicare Other | Source: Ambulatory Visit | Attending: Radiation Oncology | Admitting: Radiation Oncology

## 2022-07-17 DIAGNOSIS — Z510 Encounter for antineoplastic radiation therapy: Secondary | ICD-10-CM | POA: Diagnosis not present

## 2022-07-17 LAB — RAD ONC ARIA SESSION SUMMARY
Course Elapsed Days: 28
Plan Fractions Treated to Date: 21
Plan Prescribed Dose Per Fraction: 1.8 Gy
Plan Total Fractions Prescribed: 25
Plan Total Prescribed Dose: 45 Gy
Reference Point Dosage Given to Date: 37.8 Gy
Reference Point Session Dosage Given: 1.8 Gy
Session Number: 21

## 2022-07-18 ENCOUNTER — Other Ambulatory Visit: Payer: Self-pay | Admitting: *Deleted

## 2022-07-18 ENCOUNTER — Ambulatory Visit
Admission: RE | Admit: 2022-07-18 | Discharge: 2022-07-18 | Disposition: A | Discharge status: Home / Self Care | Payer: Medicare Other | Source: Ambulatory Visit | Attending: Radiation Oncology | Admitting: Radiation Oncology

## 2022-07-18 ENCOUNTER — Other Ambulatory Visit: Payer: Self-pay

## 2022-07-18 DIAGNOSIS — Z510 Encounter for antineoplastic radiation therapy: Secondary | ICD-10-CM | POA: Diagnosis not present

## 2022-07-18 LAB — RAD ONC ARIA SESSION SUMMARY
Course Elapsed Days: 29
Plan Fractions Treated to Date: 22
Plan Prescribed Dose Per Fraction: 1.8 Gy
Plan Total Fractions Prescribed: 25
Plan Total Prescribed Dose: 45 Gy
Reference Point Dosage Given to Date: 39.6 Gy
Reference Point Session Dosage Given: 1.8 Gy
Session Number: 22

## 2022-07-19 ENCOUNTER — Other Ambulatory Visit: Payer: Self-pay

## 2022-07-19 ENCOUNTER — Other Ambulatory Visit: Payer: Self-pay | Admitting: *Deleted

## 2022-07-19 ENCOUNTER — Inpatient Hospital Stay: Payer: Medicare Other

## 2022-07-19 ENCOUNTER — Inpatient Hospital Stay (HOSPITAL_BASED_OUTPATIENT_CLINIC_OR_DEPARTMENT_OTHER): Payer: Medicare Other | Admitting: Oncology

## 2022-07-19 ENCOUNTER — Encounter: Payer: Self-pay | Admitting: Oncology

## 2022-07-19 ENCOUNTER — Ambulatory Visit
Admission: RE | Admit: 2022-07-19 | Discharge: 2022-07-19 | Disposition: A | Discharge status: Home / Self Care | Payer: Medicare Other | Source: Ambulatory Visit | Attending: Radiation Oncology | Admitting: Radiation Oncology

## 2022-07-19 VITALS — BP 129/59 | HR 41 | Temp 96.9°F | Resp 18 | Wt 142.9 lb

## 2022-07-19 DIAGNOSIS — C9000 Multiple myeloma not having achieved remission: Secondary | ICD-10-CM

## 2022-07-19 DIAGNOSIS — K047 Periapical abscess without sinus: Secondary | ICD-10-CM

## 2022-07-19 DIAGNOSIS — D631 Anemia in chronic kidney disease: Secondary | ICD-10-CM

## 2022-07-19 DIAGNOSIS — N184 Chronic kidney disease, stage 4 (severe): Secondary | ICD-10-CM

## 2022-07-19 DIAGNOSIS — Z510 Encounter for antineoplastic radiation therapy: Secondary | ICD-10-CM | POA: Diagnosis not present

## 2022-07-19 DIAGNOSIS — C669 Malignant neoplasm of unspecified ureter: Secondary | ICD-10-CM | POA: Diagnosis not present

## 2022-07-19 LAB — COMPREHENSIVE METABOLIC PANEL
ALT: 13 U/L (ref 0–44)
AST: 19 U/L (ref 15–41)
Albumin: 3.6 g/dL (ref 3.5–5.0)
Alkaline Phosphatase: 66 U/L (ref 38–126)
Anion gap: 7 (ref 5–15)
BUN: 42 mg/dL — ABNORMAL HIGH (ref 8–23)
CO2: 24 mmol/L (ref 22–32)
Calcium: 8.6 mg/dL — ABNORMAL LOW (ref 8.9–10.3)
Chloride: 106 mmol/L (ref 98–111)
Creatinine, Ser: 2.8 mg/dL — ABNORMAL HIGH (ref 0.61–1.24)
GFR, Estimated: 22 mL/min — ABNORMAL LOW (ref >60)
Glucose, Bld: 84 mg/dL (ref 70–99)
Potassium: 4.3 mmol/L (ref 3.5–5.1)
Sodium: 137 mmol/L (ref 135–145)
Total Bilirubin: 0.6 mg/dL (ref 0.3–1.2)
Total Protein: 6.1 g/dL — ABNORMAL LOW (ref 6.5–8.1)

## 2022-07-19 LAB — RAD ONC ARIA SESSION SUMMARY
Course Elapsed Days: 30
Plan Fractions Treated to Date: 23
Plan Prescribed Dose Per Fraction: 1.8 Gy
Plan Total Fractions Prescribed: 25
Plan Total Prescribed Dose: 45 Gy
Reference Point Dosage Given to Date: 41.4 Gy
Reference Point Session Dosage Given: 1.8 Gy
Session Number: 23

## 2022-07-19 LAB — CBC WITH DIFFERENTIAL/PLATELET
Abs Immature Granulocytes: 0.02 10*3/uL (ref 0.00–0.07)
Basophils Absolute: 0 10*3/uL (ref 0.0–0.1)
Basophils Relative: 0 %
Eosinophils Absolute: 0.4 10*3/uL (ref 0.0–0.5)
Eosinophils Relative: 10 %
HCT: 32.6 % — ABNORMAL LOW (ref 39.0–52.0)
Hemoglobin: 10.5 g/dL — ABNORMAL LOW (ref 13.0–17.0)
Immature Granulocytes: 1 %
Lymphocytes Relative: 8 %
Lymphs Abs: 0.3 10*3/uL — ABNORMAL LOW (ref 0.7–4.0)
MCH: 31.3 pg (ref 26.0–34.0)
MCHC: 32.2 g/dL (ref 30.0–36.0)
MCV: 97 fL (ref 80.0–100.0)
Monocytes Absolute: 0.4 10*3/uL (ref 0.1–1.0)
Monocytes Relative: 11 %
Neutro Abs: 2.6 10*3/uL (ref 1.7–7.7)
Neutrophils Relative %: 70 %
Platelets: 153 10*3/uL (ref 150–400)
RBC: 3.36 MIL/uL — ABNORMAL LOW (ref 4.22–5.81)
RDW: 14.2 % (ref 11.5–15.5)
WBC: 3.7 10*3/uL — ABNORMAL LOW (ref 4.0–10.5)
nRBC: 0 % (ref 0.0–0.2)

## 2022-07-19 MED ORDER — DIPHENHYDRAMINE HCL 25 MG PO CAPS
50.0000 mg | ORAL_CAPSULE | Freq: Once | ORAL | Status: AC
  Administered 2022-07-19: 50 mg via ORAL

## 2022-07-19 MED ORDER — DARATUMUMAB-HYALURONIDASE-FIHJ 1800-30000 MG-UT/15ML ~~LOC~~ SOLN
1800.0000 mg | Freq: Once | SUBCUTANEOUS | Status: AC
  Administered 2022-07-19: 1800 mg via SUBCUTANEOUS
  Filled 2022-07-19: qty 15

## 2022-07-19 MED ORDER — ACETAMINOPHEN 325 MG PO TABS
650.0000 mg | ORAL_TABLET | Freq: Once | ORAL | Status: AC
  Administered 2022-07-19: 650 mg via ORAL

## 2022-07-19 MED ORDER — DEXAMETHASONE 4 MG PO TABS
20.0000 mg | ORAL_TABLET | Freq: Once | ORAL | Status: AC
  Administered 2022-07-19: 20 mg via ORAL

## 2022-07-19 MED ORDER — ACYCLOVIR 200 MG PO CAPS: 200.0000 mg | ORAL_CAPSULE | Freq: Two times a day (BID) | ORAL | 3 refills | Status: DC

## 2022-07-19 NOTE — Assessment & Plan Note (Signed)
He has finished a course of Amoxicillin. Symptoms have improved.  Recommend future prophylactic antibiotic for his future root canal procedure.

## 2022-07-19 NOTE — Progress Notes (Signed)
DISCONTINUE ON PATHWAY REGIMEN - Multiple Myeloma and Other Plasma Cell Dyscrasias     Cycle 1 and 2: A cycle is every 28 days:     Lenalidomide      Dexamethasone      Daratumumab    Cycles 3 through 6: A cycle is every 28 days:     Lenalidomide      Dexamethasone      Daratumumab    Cycles 7 and beyond: A cycle is every 28 days:     Lenalidomide      Dexamethasone      Daratumumab   **Always confirm dose/schedule in your pharmacy ordering system**  REASON: Other Reason PRIOR TREATMENT: [id number]: DaraRd (Daratumumab 16 mg/kg IV + Lenalidomide + Dexamethasone 40 mg PO/IV) q28 Days Until Progression or Unacceptable Toxicity TREATMENT RESPONSE: Unable to Evaluate  START OFF PATHWAY REGIMEN - Multiple Myeloma and Other Plasma Cell Dyscrasias   [id number]:DaraVRd (Daratumumab SUBQ + Bortezomib SUBQ + Lenalidomide PO + Dexamethasone IV/PO) q21 Days (Induction Schema):   A cycle is every 21 days:     Lenalidomide      Dexamethasone      Bortezomib      Daratumumab and hyaluronidase-fihj   **Always confirm dose/schedule in your pharmacy ordering system**  Patient Characteristics: Multiple Myeloma, Newly Diagnosed, Transplant Ineligible or Refused, High Risk Disease Classification: Multiple Myeloma R-ISS Staging: III Therapeutic Status: Newly Diagnosed Is Patient Eligible for Transplant<= Transplant Ineligible or Refused Risk Status: High Risk Intent of Therapy: Non-Curative / Palliative Intent, Discussed with Patient

## 2022-07-19 NOTE — Patient Instructions (Signed)
[facility]  Discharge Instructions: Thank you for choosing [facility] to provide your oncology and hematology care.  If you have a lab appointment with the Cancer Center, please go directly to the Cancer Center and check in at the registration area.  Wear comfortable clothing and clothing appropriate for easy access to any Portacath or PICC line.   We strive to give you quality time with your provider. You may need to reschedule your appointment if you arrive late (15 or more minutes).  Arriving late affects you and other patients whose appointments are after yours.  Also, if you miss three or more appointments without notifying the office, you may be dismissed from the clinic at the provider's discretion.      For prescription refill requests, have your pharmacy contact our office and allow 72 hours for refills to be completed.       To help prevent nausea and vomiting after your treatment, we encourage you to take your nausea medication as directed.  BELOW ARE SYMPTOMS THAT SHOULD BE REPORTED IMMEDIATELY: *FEVER GREATER THAN 100.4 F (38 C) OR HIGHER *CHILLS OR SWEATING *NAUSEA AND VOMITING THAT IS NOT CONTROLLED WITH YOUR NAUSEA MEDICATION *UNUSUAL SHORTNESS OF BREATH *UNUSUAL BRUISING OR BLEEDING *URINARY PROBLEMS (pain or burning when urinating, or frequent urination) *BOWEL PROBLEMS (unusual diarrhea, constipation, pain near the anus) TENDERNESS IN MOUTH AND THROAT WITH OR WITHOUT PRESENCE OF ULCERS (sore throat, sores in mouth, or a toothache) UNUSUAL RASH, SWELLING OR PAIN  UNUSUAL VAGINAL DISCHARGE OR ITCHING   Items with * indicate a potential emergency and should be followed up as soon as possible or go to the Emergency Department if any problems should occur.  Please show the CHEMOTHERAPY ALERT CARD or IMMUNOTHERAPY ALERT CARD at check-in to the Emergency Department and triage nurse.  Should you have questions after your  visit or need to cancel or reschedule your appointment, please contact [facility]  [phone] and follow the prompts.  Office hours are 8:00 a.m. to 4:30 p.m. Monday - Friday. Please note that voicemails left after 4:00 p.m. may not be returned until the following business day.  We are closed weekends and major holidays. You have access to a nurse at all times for urgent questions. Please call the main number to the clinic [phone] and follow the prompts.  For any non-urgent questions, you may also contact your provider using MyChart. We now offer e-Visits for anyone 18 and older to request care online for non-urgent symptoms. For details visit [email or web address].   Also download the MyChart app! Go to the app store, search "MyChart", open the app, select , and log in with your MyChart username and password.  Masks are optional in the cancer centers. If you would like for your care team to wear a mask while they are taking care of you, please let them know. For doctor visits, patients may have with them one support person who is at least 83 years old. At this time, visitors are not allowed in the infusion area.   

## 2022-07-19 NOTE — Assessment & Plan Note (Addendum)
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  He will need distal ureterectomy.  Pending [facility] urology oncology evaluation.  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 He has another PET at [facility]. Findings were reviewed.   I have previously discussed with his  [facility] Dr. Vito Berger who recommends radiation only.  His case was discussed on [facility] neuro-oncology tumor board.  Patient is currently on radiation.  We discussed a plan of repeating imaging 6 to 8 weeks after finishing radiation.

## 2022-07-19 NOTE — Assessment & Plan Note (Addendum)
Avoid nephrotoxins.  Encourage oral hydration  

## 2022-07-19 NOTE — Progress Notes (Signed)
Pt here for follow up. No new concerns voiced.   

## 2022-07-19 NOTE — Progress Notes (Signed)
Hematology/Oncology Progress note [phone] [phone]      Patient Care Team: [name] Hire, [name] as PCP - General (Internal Medicine) Noreene Filbert, [name] as Radiation Oncologist (Radiation Oncology) [name] Hire, [name] (Internal Medicine) [name] Dana, [name] as Consulting Physician (Nephrology) [name] Server, [name] as Consulting Physician (Oncology)  ASSESSMENT & PLAN:   Multiple myeloma [name] [name])  #Stage III IgA kappa multiple myeloma, myeloma FISH panel positive for 13q deletion, gain of 1q, t (4;14), high risk, not candidate for autologous bone marrow transplant. 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 Labs reviewed and discussed patient [name] protein level and light chain ratio are both improving. proceed with cycle 4 Daratumumab-hold off Revlimid and Velcade- he is currently on Radiation.  Continue aspirin 81 mg daily for DVT prophylaxis. Continue Acyclovir 239m BID  -Bone health, currently he does not have an active myeloma bone involvement.  We discussed about future recommendation of bisphosphonate or denosumab treatments.  Patient reports history of developing adverse reaction after Prolia treatments. -Currently he also has ongoing dental issue need root canal.  Hold off bisphosphonate.  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  He will need distal ureterectomy.  [facility]  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 [facility].   I have previously discussed with his  [name] Dr. MVito Bergerwho recommends radiation only.  [facility].  Patient is currently on radiation.  We discussed a plan of repeating imaging 6 to 8 weeks after finishing radiation.   Anemia in stage 4 chronic kidney disease (HCC) Stable hemoglobin. monitor  Tooth infection He has finished a course of Amoxicillin. Symptoms have improved.  Recommend future prophylactic antibiotic for his future root canal procedure.    Stage 4 chronic kidney disease (HCC) Avoid nephrotoxins.  Encourage oral hydration.   No orders of the defined types were placed in this encounter.   Follow-up Lab Daratumumab in 2 weeks  Lab [name] D[facility] Velcade 4 weeks All questions were answered. The patient knows to call the clinic with any problems, questions or concerns.  ZEarlie Server MD, PhD [name] 07/19/2022    CHIEF COMPLAINTS/REASON FOR VISIT:  Multiple myeloma, high-grade urothelial carcinoma.  HISTORY OF PRESENTING ILLNESS:  KNoah [name] is a 81y.o. male who was seen in consultation at the request of [name] [name] presents for follow-up of multiple myeloma and high-grade urothelial carcinoma management.    Patient has progressively worsening kidney function.  He is scheduled for kidney biopsy. 01/26/2022, free kappa light chain 1058, lambda 15.3, light chain ratio 69. Protein electrophoresis showed restricted band-[name] spike migrating in the beta-1 globulin region. ANA negative.  ANCA negative. Random urine protein electrophoresis showed abnormal protein band detected in the gamma globulin And a second possible abnormal protein band that may represent monoclonal protein.  Patient denies any back pain, unintentional weight loss, night sweats or fever. He lives at home with wife.  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   CMP showed creatinine of 3.41, that is significantly worse than his baseline in June 2022. 02/28/2022, 24-hour urine protein electrophoresis showed [name] protein of 729 mg, IgA monoclonal kappa light chain. 03/01/2022, [name] kidney biopsy showed diffuse light chain tubulopathy, and light chain cast nephropathy, moderate interstitial fibrosis,4% global glomerular sclerosis and the marked atherosclerosis.  03/02/2022 PET scan showed no definitive signs of multiple myeloma by FDG PET. Obstructive right ureter with soft tissue mass in the right pelvis showing increased metabolic activity suspicious for urothelial neoplasm.  Little or no excretion of FDG on the RIGHT but still with uptake of FDG by renal parenchyma. Degree of hydronephrosis is not substantially changed but lack of FDG excretion on the RIGHT is compatible with secondary sign of physiologic significance of ureteral obstruction. RIGHT pelvic sidewall uptake without visible lesion, could represent tiny lymph node not visible on noncontrast imaging. If the patient is no longer and acute renal failure could consider MRI with and without contrast for further assessment of pelvic findings. Ultimately cystoscopy may be helpful for further assessment, aortic atherosclerosis, pulmonary emphysema.   03/06/2022, patient underwent a bone marrow biopsy. Pathology showed hypercellular bone marrow with plasma cell neoplasm, representing 20% of all cells in the aspirate associated with prominent interstitial infiltrates and numerous predominantly small clusters in the clots in the biopsy sections.  The plasma cells display kappa light chain restriction consistent with plasma cell neoplasm. Normal cytogenetics, myeloma FISH panel positive for 13q deletion, gain of 1q, t (4;14)  Patient received 20 mg dexamethasone x1 after bone marrow biopsy.  03/14/2022, cystoscopy showed diffuse narrowing right distal ureter with hydroureter following up the distal  ureter and extending proximally.  Right ureteroscopy showed nodular tumor distal ureter and a convincing area without tumor suspicious for extrinsic obstruction.s/p   right ureteral stent placement. Ureteral tumor showed a tiny fragments of fibrous tissue, interpretation limited by thermal and crush artifact.  Right distal ureter saline barbotage showed hight grade urothelial carcinoma.   INTERVAL HISTORY [name] [name] is a 81 y.o. male who has above history reviewed by me today presents for follow up visit for multiple myeloma and high-grade urothelial carcinoma.  + tooth abscess, finished a course of antibiotics, patient has no pain now.  He plans to have root canal done on 08/16/2022. Patient is currently on radiation for urothelial carcinoma.  He has experienced urinary urgency urgency and urgency.  Dr. Cheron Schaumann has prescribed him oxybutynin which helps his symptoms.. Diarrhea, symptom has improved after he switched to low fiber diet.   Review of Systems  Constitutional:  Positive for fatigue. Negative for appetite change, chills, fever and unexpected weight change.  HENT:   Negative for hearing loss and voice change.   Eyes:  Negative for eye problems and icterus.  Respiratory:  Negative for chest tightness, cough and shortness of breath.   Cardiovascular:  Negative for chest pain and leg swelling.  Gastrointestinal:  Positive for diarrhea. Negative for abdominal distention and abdominal pain.  Endocrine: Negative for hot flashes.  Genitourinary:  Positive for frequency. Negative for difficulty urinating and dysuria.   Musculoskeletal:  Negative for arthralgias.  Skin:  Negative for itching and rash.  Neurological:  Negative for light-headedness and numbness.  Hematological:  Negative for adenopathy. Does not bruise/bleed easily.  Psychiatric/Behavioral:  Negative for confusion.      MEDICAL HISTORY:  Past Medical History:  Diagnosis Date   Age related osteoporosis    Anemia     Barrett's  esophagus    Bradycardia    Cancer (HCC)    PROSTATE   Cataract    CKD (chronic kidney disease) stage 3, GFR 30-59 ml/min (HCC)    Colon polyps    DDD (degenerative disc disease), cervical    DDD (degenerative disc disease), lumbar    Femur fracture (HCC)    Right   Fundic gland polyposis of stomach    Fundic gland polyps of stomach, benign    Gastritis    GERD (gastroesophageal reflux disease)    Hematuria    Hemorrhage of rectum and anus    History of colon polyps    Insomnia    Lumbago    Osteopenia of the elderly    Skin cancer    Sleep apnea     SURGICAL HISTORY: Past Surgical History:  Procedure Laterality Date   BAND HEMORRHOIDECTOMY     COLONOSCOPY     COLONOSCOPY WITH PROPOFOL N/A 05/06/2018   Procedure: COLONOSCOPY WITH PROPOFOL;  Surgeon: Lollie Sails, [name];  [facility];  Service: Endoscopy;  Laterality: N/A;   COLONOSCOPY WITH PROPOFOL N/A 09/23/2018   Procedure: COLONOSCOPY WITH PROPOFOL;  Surgeon: Lollie Sails, [name];  Location: [name] ENDOSCOPY;  Service: Endoscopy;  Laterality: N/A;   COLONOSCOPY WITH PROPOFOL N/A 03/30/2021   Procedure: COLONOSCOPY WITH PROPOFOL;  Surgeon: Toledo, Benay Pike, [name];  Location: [facility] ENDOSCOPY;  Service: Gastroenterology;  Laterality: N/A;   CYSTOSCOPY W/ URETERAL STENT PLACEMENT Right 03/14/2022   Procedure: CYSTOSCOPY WITH RETROGRADE PYELOGRAM/URETERAL STENT PLACEMENT;  Surgeon: Abbie Sons, [name];  Location: [facility] ORS;  Service: Urology;  Laterality: Right;   ESOPHAGOGASTRODUODENOSCOPY (EGD) WITH PROPOFOL N/A 10/03/2016   Procedure: ESOPHAGOGASTRODUODENOSCOPY (EGD) WITH PROPOFOL;  Surgeon: Lollie Sails, [name];  Location: [name] ENDOSCOPY;  Service: Endoscopy;  Laterality: N/A;   EYE SURGERY     CATARACTS   EYELID SURGERY     FLEXIBLE SIGMOIDOSCOPY     FRACTURE SURGERY Right 2016   femur   FRACTURE SURGERY     ORIF DISTAL FEMUR FRACTURE     TONSILLECTOMY     TRANSURETHRAL RESECTION OF BLADDER  TUMOR N/A 03/14/2022   Procedure: TRANSURETHRAL RESECTION OF BLADDER TUMOR (TURBT);  Surgeon: Abbie Sons, [name];  Location: [facility] ORS;  Service: Urology;  Laterality: N/A;    SOCIAL HISTORY: Social History   Socioeconomic History   Marital status: Married    Spouse name: Not on file   Number of children: Not on file   Years of education: Not on file   Highest education level: Not on file  Occupational History   Not on file  Tobacco Use   Smoking status: Former    Packs/day: 1.00    Years: 15.00    Total pack years: 15.00    Types: Cigarettes    Quit date: 10/03/1974    Years since quitting: 47.8   Smokeless tobacco: Never  Vaping Use   Vaping Use: Never used  Substance and Sexual Activity   Alcohol use: Not Currently    Alcohol/week: 1.0 standard drink of alcohol    Types: 1 Cans of beer per week    Comment: 1 beer every 2 weeks   Drug use: No   Sexual activity: Yes    Birth control/protection: None  Other Topics Concern   Not on file  Social History Narrative   ** Merged History Encounter **       Social Determinants of Health   Financial Resource Strain: Not on file  Food  Insecurity: Not on file  Transportation Needs: Not on file  Physical Activity: Not on file  Stress: Not on file  Social Connections: Not on file  Intimate Partner Violence: Not on file    FAMILY HISTORY: Family History  Problem Relation Age of Onset   Breast cancer Mother     ALLERGIES:  is allergic to demerol [meperidine hcl], prolia [denosumab], demerol [meperidine], and nsaids.  MEDICATIONS:  Current Outpatient Medications  Medication Sig Dispense Refill   aspirin EC 81 MG tablet Take 1 tablet (81 mg total) by mouth daily. Swallow whole. 30 tablet 11   Calcium Carbonate (CALCIUM 500 PO) Take 1 tablet by mouth daily.     Cholecalciferol (VITAMIN D3) 125 MCG (5000 UT) CAPS Take 5,000 Units by mouth daily.     ferrous sulfate 325 (65 FE) MG tablet Take 325 mg by mouth daily.      ipratropium (ATROVENT) 0.03 % nasal spray Place 1 spray into both nostrils in the morning.     losartan (COZAAR) 25 MG tablet Take 25 mg by mouth daily.     melatonin 5 MG TABS Take 5 mg by mouth at bedtime.     montelukast (SINGULAIR) 10 MG tablet Take 1 tablet (10 mg total) by mouth See admin instructions. Take 1 tablet the day prior to Daratumumab treatments, and take 1 tablet daily for 2 days after treatments 30 tablet 1   Multiple Vitamins-Minerals (MENS 50+ MULTIVITAMIN) TABS Take 1 tablet by mouth daily.     ondansetron (ZOFRAN) 8 MG tablet Take 1 tablet (8 mg total) by mouth 2 (two) times daily as needed (Nausea or vomiting). 30 tablet 1   oxybutynin (DITROPAN XL) 10 MG 24 hr tablet Take 1 tablet (10 mg total) by mouth at bedtime. 30 tablet 1   senna (SENOKOT) 8.6 MG TABS tablet Take 2 tablets (17.2 mg total) by mouth daily. 120 tablet 0   traZODone (DESYREL) 50 MG tablet Take 25 mg by mouth at bedtime.     triamcinolone ointment (KENALOG) 0.5 % Apply 1 application. topically 2 (two) times daily. 30 g 0   acyclovir (ZOVIRAX) 200 MG capsule Take 1 capsule (200 mg total) by mouth 2 (two) times daily. 60 capsule 3   gabapentin (NEURONTIN) 300 MG capsule Take 1 capsule (300 mg total) by mouth at bedtime. (Patient not taking: Reported on 07/19/2022) 30 capsule 3   HYDROcodone-acetaminophen (NORCO) 7.5-325 MG tablet 0.5-1 tab every 6 hours as needed for pain (Patient not taking: Reported on 07/19/2022) 8 tablet 0   lenalidomide (REVLIMID) 5 MG capsule Take 1 capsule (5 mg total) by mouth See admin instructions. TAKE 1 CAPSULE BY MOUTH ONCE DAILY FOR 21 DAYS ON AND 7 DAYS OFF (Patient not taking: Reported on 07/19/2022) 21 capsule 0   No current facility-administered medications for this visit.     PHYSICAL EXAMINATION: ECOG PERFORMANCE STATUS: 1 - Symptomatic but completely ambulatory Today's Vitals   07/19/22 0936  BP: (!) 129/59  Pulse: (!) 41  Resp: 18  Temp: (!) 96.9 F (36.1 C)   Weight: 142 lb 14.4 oz (64.8 kg)  PainSc: 0-No pain   Body mass index is 22.38 kg/[name].   Physical Exam Constitutional:      General: He is not in acute distress. HENT:     Head: Normocephalic and atraumatic.  Eyes:     General: No scleral icterus. Cardiovascular:     Rate and Rhythm: Normal rate and regular rhythm.     Heart  sounds: Normal heart sounds.  Pulmonary:     Effort: Pulmonary effort is normal. No respiratory distress.     Breath sounds: No wheezing.  Abdominal:     General: Bowel sounds are normal. There is no distension.     Palpations: Abdomen is soft.  Musculoskeletal:        General: No deformity. Normal range of motion.     Cervical back: Normal range of motion and neck supple.  Skin:    General: Skin is warm and dry.     Findings: No erythema or rash.  Neurological:     Mental Status: He is alert and oriented to person, place, and time. Mental status is at baseline.     Cranial Nerves: No cranial nerve deficit.     Coordination: Coordination normal.  Psychiatric:        Mood and Affect: Mood normal.      LABORATORY DATA:  I have reviewed the data as listed Lab Results  Component Value Date   WBC 3.7 (L) 07/19/2022   HGB 10.5 (L) 07/19/2022   HCT 32.6 (L) 07/19/2022   MCV 97.0 07/19/2022   PLT 153 07/19/2022   Recent Labs    06/21/22 1159 07/05/22 1329 07/19/22 0919  NA 139 138 137  K 4.4 4.7 4.3  CL 108 109 106  CO2 _0 GLUCOSE 109* 117* 84  BUN 38* 40* 42*  CREATININE 2.71* 2.71* 2.80*  CALCIUM 8.1* 8.5* 8.6*  GFRNONAA 23* 23* 22*  PROT 5.3* 6.0* 6.1*  ALBUMIN 3.0* 3.5 3.6  AST _1 ALT _2 ALKPHOS 62 64 66  BILITOT 0.5 0.4 0.6    Iron/TIBC/Ferritin/ %Sat    Component Value Date/Time   IRON 60 02/23/2022 1523   TIBC 238 (L) 02/23/2022 1523   FERRITIN 86 02/23/2022 1523   IRONPCTSAT 25 02/23/2022 1523       RADIOGRAPHIC STUDIES: I have personally reviewed the radiological images as listed and agreed  with the findings in the report.  US Venous Img Lower Bilateral  Result Date: 06/28/2022 CLINICAL DATA:  Bilateral lower extremity pain and edema for the past 3 days. History of multiple myeloma and bladder cancer. Evaluate for DVT. EXAM: BILATERAL LOWER EXTREMITY VENOUS DOPPLER ULTRASOUND TECHNIQUE: Gray-scale sonography with graded compression, as well as color Doppler and duplex ultrasound were performed to evaluate the lower extremity deep venous systems from the level of the common femoral vein and including the common femoral, femoral, profunda femoral, popliteal and calf veins including the posterior tibial, peroneal and gastrocnemius veins when visible. The superficial great saphenous vein was also interrogated. Spectral Doppler was utilized to evaluate flow at rest and with distal augmentation maneuvers in the common femoral, femoral and popliteal veins. COMPARISON:  None Available. FINDINGS: RIGHT LOWER EXTREMITY Common Femoral Vein: No evidence of thrombus. Normal compressibility, respiratory phasicity and response to augmentation. Saphenofemoral Junction: No evidence of thrombus. Normal compressibility and flow on color Doppler imaging. Profunda Femoral Vein: No evidence of thrombus. Normal compressibility and flow on color Doppler imaging. Femoral Vein: No evidence of thrombus. Normal compressibility, respiratory phasicity and response to augmentation. Popliteal Vein: No evidence of thrombus. Normal compressibility, respiratory phasicity and response to augmentation. Calf Veins: No evidence of thrombus. Normal compressibility and flow on color Doppler imaging. Superficial Great Saphenous Vein: No evidence of thrombus. Normal compressibility. Other Findings:  None. LEFT LOWER EXTREMITY Common Femoral Vein: No evidence of thrombus. Normal compressibility, respiratory phasicity and response  to augmentation. Saphenofemoral Junction: No evidence of thrombus. Normal compressibility and flow on color  Doppler imaging. Profunda Femoral Vein: No evidence of thrombus. Normal compressibility and flow on color Doppler imaging. Femoral Vein: No evidence of thrombus. Normal compressibility, respiratory phasicity and response to augmentation. Popliteal Vein: No evidence of thrombus. Normal compressibility, respiratory phasicity and response to augmentation. Calf Veins: No evidence of thrombus. Normal compressibility and flow on color Doppler imaging. Superficial Great Saphenous Vein: No evidence of thrombus. Normal compressibility. Other Findings:  None. IMPRESSION: No evidence of DVT within either lower extremity. Electronically Signed   By: Sandi Mariscal [name].D.   On: 06/28/2022 09:40   DG Chest 2 View  Result Date: 06/22/2022 CLINICAL DATA:  Cough. EXAM: CHEST - 2 VIEW COMPARISON:  May 15, 2022. FINDINGS: The heart size and mediastinal contours are within normal limits. Both lungs are clear. The visualized skeletal structures are unremarkable. IMPRESSION: No active cardiopulmonary disease. Electronically Signed   By: Marijo Conception [name].D.   On: 06/22/2022 16:45   DG Chest 2 View  Result Date: 05/16/2022 CLINICAL DATA:  Cough and wheezing. EXAM: CHEST - 2 VIEW COMPARISON:  June 23, 2021 FINDINGS: The heart size and mediastinal contours are within normal limits. Mild patchy opacities identified in the left lung base. The right lung is clear. There is no pleural effusion or pulmonary edema. Chronic deformity of the right clavicle is noted. IMPRESSION: Mild patchy opacities identified in the left lung base, developing pneumonia is not excluded. Electronically Signed   By: Abelardo Diesel [name].D.   On: 05/16/2022 10:37

## 2022-07-19 NOTE — Assessment & Plan Note (Signed)
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 Labs reviewed and discussed patient M protein level and light chain ratio are both improving. proceed with cycle 4 Daratumumab-hold off Revlimid and Velcade- he is currently on Radiation.  Continue aspirin 81 mg daily for DVT prophylaxis. Continue Acyclovir 200mg  BID  -Bone health, currently he does not have an active myeloma bone involvement.  We discussed about future recommendation of bisphosphonate or denosumab treatments.  Patient reports history of developing adverse reaction after Prolia treatments. -Currently he also has ongoing dental issue need root canal.  Hold off bisphosphonate.

## 2022-07-19 NOTE — Assessment & Plan Note (Signed)
Stable hemoglobin. monitor 

## 2022-07-20 ENCOUNTER — Ambulatory Visit
Admission: RE | Admit: 2022-07-20 | Discharge: 2022-07-20 | Disposition: A | Discharge status: Home / Self Care | Payer: Medicare Other | Source: Ambulatory Visit | Attending: Radiation Oncology | Admitting: Radiation Oncology

## 2022-07-20 ENCOUNTER — Other Ambulatory Visit: Payer: Self-pay

## 2022-07-20 DIAGNOSIS — Z510 Encounter for antineoplastic radiation therapy: Secondary | ICD-10-CM | POA: Diagnosis not present

## 2022-07-20 LAB — RAD ONC ARIA SESSION SUMMARY
Course Elapsed Days: 31
Plan Fractions Treated to Date: 24
Plan Prescribed Dose Per Fraction: 1.8 Gy
Plan Total Fractions Prescribed: 25
Plan Total Prescribed Dose: 45 Gy
Reference Point Dosage Given to Date: 43.2 Gy
Reference Point Session Dosage Given: 1.8 Gy
Session Number: 24

## 2022-07-20 LAB — KAPPA/LAMBDA LIGHT CHAINS
Kappa free light chain: 121.8 mg/L — ABNORMAL HIGH (ref 3.3–19.4)
Kappa, lambda light chain ratio: 13.1 — ABNORMAL HIGH (ref 0.26–1.65)
Lambda free light chains: 9.3 mg/L (ref 5.7–26.3)

## 2022-07-21 ENCOUNTER — Other Ambulatory Visit: Payer: Self-pay

## 2022-07-21 ENCOUNTER — Ambulatory Visit: Admission: RE | Admit: 2022-07-21 | Payer: Medicare Other | Source: Ambulatory Visit

## 2022-07-21 ENCOUNTER — Ambulatory Visit
Admission: RE | Admit: 2022-07-21 | Discharge: 2022-07-21 | Disposition: A | Discharge status: Home / Self Care | Payer: Medicare Other | Source: Ambulatory Visit | Attending: Radiation Oncology | Admitting: Radiation Oncology

## 2022-07-21 DIAGNOSIS — Z510 Encounter for antineoplastic radiation therapy: Secondary | ICD-10-CM | POA: Diagnosis not present

## 2022-07-21 LAB — RAD ONC ARIA SESSION SUMMARY
Course Elapsed Days: 32
Plan Fractions Treated to Date: 25
Plan Prescribed Dose Per Fraction: 1.8 Gy
Plan Total Fractions Prescribed: 25
Plan Total Prescribed Dose: 45 Gy
Reference Point Dosage Given to Date: 45 Gy
Reference Point Session Dosage Given: 1.8 Gy
Session Number: 25

## 2022-07-24 ENCOUNTER — Ambulatory Visit: Admission: RE | Admit: 2022-07-24 | Payer: Medicare Other | Source: Ambulatory Visit

## 2022-07-24 ENCOUNTER — Ambulatory Visit: Payer: Medicare Other

## 2022-07-24 LAB — MULTIPLE MYELOMA PANEL, SERUM
Albumin SerPl Elph-Mcnc: 3.3 g/dL (ref 2.9–4.4)
Albumin/Glob SerPl: 1.6 (ref 0.7–1.7)
Alpha 1: 0.3 g/dL (ref 0.0–0.4)
Alpha2 Glob SerPl Elph-Mcnc: 0.6 g/dL (ref 0.4–1.0)
B-Globulin SerPl Elph-Mcnc: 0.9 g/dL (ref 0.7–1.3)
Gamma Glob SerPl Elph-Mcnc: 0.4 g/dL (ref 0.4–1.8)
Globulin, Total: 2.2 g/dL (ref 2.2–3.9)
IgA: 417 mg/dL (ref 61–437)
IgG (Immunoglobin G), Serum: 400 mg/dL — ABNORMAL LOW (ref 603–1613)
IgM (Immunoglobulin M), Srm: 29 mg/dL (ref 15–143)
M Protein SerPl Elph-Mcnc: 0.4 g/dL — ABNORMAL HIGH
Total Protein ELP: 5.5 g/dL — ABNORMAL LOW (ref 6.0–8.5)

## 2022-07-25 ENCOUNTER — Ambulatory Visit: Payer: Medicare Other

## 2022-07-26 ENCOUNTER — Ambulatory Visit: Payer: Medicare Other

## 2022-07-27 ENCOUNTER — Other Ambulatory Visit: Payer: Self-pay

## 2022-07-27 ENCOUNTER — Ambulatory Visit
Admission: RE | Admit: 2022-07-27 | Discharge: 2022-07-27 | Disposition: A | Discharge status: Home / Self Care | Payer: Medicare Other | Source: Ambulatory Visit | Attending: Radiation Oncology | Admitting: Radiation Oncology

## 2022-07-27 DIAGNOSIS — Z510 Encounter for antineoplastic radiation therapy: Secondary | ICD-10-CM | POA: Diagnosis not present

## 2022-07-27 LAB — RAD ONC ARIA SESSION SUMMARY
Course Elapsed Days: 38
Plan Fractions Treated to Date: 1
Plan Prescribed Dose Per Fraction: 2 Gy
Plan Total Fractions Prescribed: 10
Plan Total Prescribed Dose: 20 Gy
Reference Point Dosage Given to Date: 47 Gy
Reference Point Session Dosage Given: 2 Gy
Session Number: 26

## 2022-07-28 ENCOUNTER — Other Ambulatory Visit: Payer: Self-pay

## 2022-07-28 ENCOUNTER — Ambulatory Visit
Admission: RE | Admit: 2022-07-28 | Discharge: 2022-07-28 | Disposition: A | Discharge status: Home / Self Care | Payer: Medicare Other | Source: Ambulatory Visit | Attending: Radiation Oncology | Admitting: Radiation Oncology

## 2022-07-28 DIAGNOSIS — Z510 Encounter for antineoplastic radiation therapy: Secondary | ICD-10-CM | POA: Diagnosis not present

## 2022-07-28 LAB — RAD ONC ARIA SESSION SUMMARY
Course Elapsed Days: 39
Plan Fractions Treated to Date: 2
Plan Prescribed Dose Per Fraction: 2 Gy
Plan Total Fractions Prescribed: 10
Plan Total Prescribed Dose: 20 Gy
Reference Point Dosage Given to Date: 49 Gy
Reference Point Session Dosage Given: 2 Gy
Session Number: 27

## 2022-07-31 ENCOUNTER — Ambulatory Visit
Admission: RE | Admit: 2022-07-31 | Discharge: 2022-07-31 | Disposition: A | Discharge status: Home / Self Care | Payer: Medicare Other | Source: Ambulatory Visit | Attending: Radiation Oncology | Admitting: Radiation Oncology

## 2022-07-31 ENCOUNTER — Other Ambulatory Visit: Payer: Self-pay

## 2022-07-31 DIAGNOSIS — Z510 Encounter for antineoplastic radiation therapy: Secondary | ICD-10-CM | POA: Diagnosis not present

## 2022-07-31 LAB — RAD ONC ARIA SESSION SUMMARY
Course Elapsed Days: 42
Plan Fractions Treated to Date: 3
Plan Prescribed Dose Per Fraction: 2 Gy
Plan Total Fractions Prescribed: 10
Plan Total Prescribed Dose: 20 Gy
Reference Point Dosage Given to Date: 51 Gy
Reference Point Session Dosage Given: 2 Gy
Session Number: 28

## 2022-08-01 ENCOUNTER — Other Ambulatory Visit: Payer: Self-pay

## 2022-08-01 ENCOUNTER — Ambulatory Visit
Admission: RE | Admit: 2022-08-01 | Discharge: 2022-08-01 | Disposition: A | Discharge status: Home / Self Care | Payer: Medicare Other | Source: Ambulatory Visit | Attending: Radiation Oncology | Admitting: Radiation Oncology

## 2022-08-01 DIAGNOSIS — Z510 Encounter for antineoplastic radiation therapy: Secondary | ICD-10-CM | POA: Diagnosis not present

## 2022-08-01 LAB — RAD ONC ARIA SESSION SUMMARY
Course Elapsed Days: 43
Plan Fractions Treated to Date: 4
Plan Prescribed Dose Per Fraction: 2 Gy
Plan Total Fractions Prescribed: 10
Plan Total Prescribed Dose: 20 Gy
Reference Point Dosage Given to Date: 53 Gy
Reference Point Session Dosage Given: 2 Gy
Session Number: 29

## 2022-08-02 ENCOUNTER — Ambulatory Visit
Admission: RE | Admit: 2022-08-02 | Discharge: 2022-08-02 | Disposition: A | Discharge status: Home / Self Care | Payer: Medicare Other | Source: Ambulatory Visit | Attending: Radiation Oncology | Admitting: Radiation Oncology

## 2022-08-02 ENCOUNTER — Other Ambulatory Visit: Payer: Self-pay

## 2022-08-02 ENCOUNTER — Inpatient Hospital Stay: Payer: Medicare Other

## 2022-08-02 VITALS — BP 129/57 | HR 41 | Temp 97.1°F | Resp 18

## 2022-08-02 DIAGNOSIS — Z510 Encounter for antineoplastic radiation therapy: Secondary | ICD-10-CM | POA: Diagnosis not present

## 2022-08-02 DIAGNOSIS — C9000 Multiple myeloma not having achieved remission: Secondary | ICD-10-CM

## 2022-08-02 LAB — RAD ONC ARIA SESSION SUMMARY
Course Elapsed Days: 44
Plan Fractions Treated to Date: 5
Plan Prescribed Dose Per Fraction: 2 Gy
Plan Total Fractions Prescribed: 10
Plan Total Prescribed Dose: 20 Gy
Reference Point Dosage Given to Date: 55 Gy
Reference Point Session Dosage Given: 2 Gy
Session Number: 30

## 2022-08-02 LAB — COMPREHENSIVE METABOLIC PANEL
ALT: 12 U/L (ref 0–44)
AST: 18 U/L (ref 15–41)
Albumin: 3.5 g/dL (ref 3.5–5.0)
Alkaline Phosphatase: 62 U/L (ref 38–126)
Anion gap: 5 (ref 5–15)
BUN: 31 mg/dL — ABNORMAL HIGH (ref 8–23)
CO2: 26 mmol/L (ref 22–32)
Calcium: 8.6 mg/dL — ABNORMAL LOW (ref 8.9–10.3)
Chloride: 106 mmol/L (ref 98–111)
Creatinine, Ser: 2.26 mg/dL — ABNORMAL HIGH (ref 0.61–1.24)
GFR, Estimated: 28 mL/min — ABNORMAL LOW (ref >60)
Glucose, Bld: 93 mg/dL (ref 70–99)
Potassium: 4.3 mmol/L (ref 3.5–5.1)
Sodium: 137 mmol/L (ref 135–145)
Total Bilirubin: 0.6 mg/dL (ref 0.3–1.2)
Total Protein: 5.8 g/dL — ABNORMAL LOW (ref 6.5–8.1)

## 2022-08-02 LAB — CBC WITH DIFFERENTIAL/PLATELET
Abs Immature Granulocytes: 0.01 10*3/uL (ref 0.00–0.07)
Basophils Absolute: 0 10*3/uL (ref 0.0–0.1)
Basophils Relative: 0 %
Eosinophils Absolute: 0.2 10*3/uL (ref 0.0–0.5)
Eosinophils Relative: 5 %
HCT: 33.2 % — ABNORMAL LOW (ref 39.0–52.0)
Hemoglobin: 11 g/dL — ABNORMAL LOW (ref 13.0–17.0)
Immature Granulocytes: 0 %
Lymphocytes Relative: 10 %
Lymphs Abs: 0.4 10*3/uL — ABNORMAL LOW (ref 0.7–4.0)
MCH: 31.7 pg (ref 26.0–34.0)
MCHC: 33.1 g/dL (ref 30.0–36.0)
MCV: 95.7 fL (ref 80.0–100.0)
Monocytes Absolute: 0.5 10*3/uL (ref 0.1–1.0)
Monocytes Relative: 14 %
Neutro Abs: 2.8 10*3/uL (ref 1.7–7.7)
Neutrophils Relative %: 71 %
Platelets: 167 10*3/uL (ref 150–400)
RBC: 3.47 MIL/uL — ABNORMAL LOW (ref 4.22–5.81)
RDW: 13.9 % (ref 11.5–15.5)
WBC: 3.9 10*3/uL — ABNORMAL LOW (ref 4.0–10.5)
nRBC: 0 % (ref 0.0–0.2)

## 2022-08-02 MED ORDER — DEXAMETHASONE 4 MG PO TABS
20.0000 mg | ORAL_TABLET | Freq: Once | ORAL | Status: AC
  Administered 2022-08-02: 20 mg via ORAL
  Filled 2022-08-02: qty 5

## 2022-08-02 MED ORDER — DIPHENHYDRAMINE HCL 25 MG PO CAPS
50.0000 mg | ORAL_CAPSULE | Freq: Once | ORAL | Status: AC
  Administered 2022-08-02: 50 mg via ORAL
  Filled 2022-08-02: qty 2

## 2022-08-02 MED ORDER — ACETAMINOPHEN 325 MG PO TABS
650.0000 mg | ORAL_TABLET | Freq: Once | ORAL | Status: AC
  Administered 2022-08-02: 650 mg via ORAL
  Filled 2022-08-02: qty 2

## 2022-08-02 MED ORDER — DARATUMUMAB-HYALURONIDASE-FIHJ 1800-30000 MG-UT/15ML ~~LOC~~ SOLN
1800.0000 mg | Freq: Once | SUBCUTANEOUS | Status: AC
  Administered 2022-08-02: 1800 mg via SUBCUTANEOUS
  Filled 2022-08-02: qty 15

## 2022-08-02 NOTE — Patient Instructions (Signed)
[facility] [place]  Discharge Instructions: Thank you for choosing [facility] to provide your oncology and hematology care.  If you have a lab appointment with the Cancer Center, please go directly to the Cancer Center and check in at the registration area.  Wear comfortable clothing and clothing appropriate for easy access to any Portacath or PICC line.   We strive to give you quality time with your provider. You may need to reschedule your appointment if you arrive late (15 or more minutes).  Arriving late affects you and other patients whose appointments are after yours.  Also, if you miss three or more appointments without notifying the office, you may be dismissed from the clinic at the provider's discretion.      For prescription refill requests, have your pharmacy contact our office and allow 72 hours for refills to be completed.       To help prevent nausea and vomiting after your treatment, we encourage you to take your nausea medication as directed.  BELOW ARE SYMPTOMS THAT SHOULD BE REPORTED IMMEDIATELY: *FEVER GREATER THAN 100.4 F (38 C) OR HIGHER *CHILLS OR SWEATING *NAUSEA AND VOMITING THAT IS NOT CONTROLLED WITH YOUR NAUSEA MEDICATION *UNUSUAL SHORTNESS OF BREATH *UNUSUAL BRUISING OR BLEEDING *URINARY PROBLEMS (pain or burning when urinating, or frequent urination) *BOWEL PROBLEMS (unusual diarrhea, constipation, pain near the anus) TENDERNESS IN MOUTH AND THROAT WITH OR WITHOUT PRESENCE OF ULCERS (sore throat, sores in mouth, or a toothache) UNUSUAL RASH, SWELLING OR PAIN  UNUSUAL VAGINAL DISCHARGE OR ITCHING   Items with * indicate a potential emergency and should be followed up as soon as possible or go to the Emergency Department if any problems should occur.  Please show the CHEMOTHERAPY ALERT CARD or IMMUNOTHERAPY ALERT CARD at check-in to the Emergency Department and triage nurse.  Should you have questions after your  visit or need to cancel or reschedule your appointment, please contact [facility]  [phone] and follow the prompts.  Office hours are 8:00 a.m. to 4:30 p.m. Monday - Friday. Please note that voicemails left after 4:00 p.m. may not be returned until the following business day.  We are closed weekends and major holidays. You have access to a nurse at all times for urgent questions. Please call the main number to the clinic [phone] and follow the prompts.  For any non-urgent questions, you may also contact your provider using MyChart. We now offer e-Visits for anyone 18 and older to request care online for non-urgent symptoms. For details visit [email or web address].   Also download the MyChart app! Go to the app store, search "MyChart", open the app, select [facility], and log in with your MyChart username and password.  Masks are optional in the cancer centers. If you would like for your care team to wear a mask while they are taking care of you, please let them know. For doctor visits, patients may have with them one support person who is at least 83 years old. At this time, visitors are not allowed in the infusion area.   

## 2022-08-03 ENCOUNTER — Ambulatory Visit
Admission: RE | Admit: 2022-08-03 | Discharge: 2022-08-03 | Disposition: A | Discharge status: Home / Self Care | Payer: Medicare Other | Source: Ambulatory Visit | Attending: Radiation Oncology | Admitting: Radiation Oncology

## 2022-08-03 ENCOUNTER — Other Ambulatory Visit: Payer: Self-pay

## 2022-08-03 DIAGNOSIS — Z510 Encounter for antineoplastic radiation therapy: Secondary | ICD-10-CM | POA: Diagnosis not present

## 2022-08-03 LAB — RAD ONC ARIA SESSION SUMMARY
Course Elapsed Days: 45
Plan Fractions Treated to Date: 6
Plan Prescribed Dose Per Fraction: 2 Gy
Plan Total Fractions Prescribed: 10
Plan Total Prescribed Dose: 20 Gy
Reference Point Dosage Given to Date: 57 Gy
Reference Point Session Dosage Given: 2 Gy
Session Number: 31

## 2022-08-04 ENCOUNTER — Ambulatory Visit
Admission: RE | Admit: 2022-08-04 | Discharge: 2022-08-04 | Disposition: A | Discharge status: Home / Self Care | Payer: Medicare Other | Source: Ambulatory Visit | Attending: Radiation Oncology | Admitting: Radiation Oncology

## 2022-08-04 ENCOUNTER — Other Ambulatory Visit: Payer: Self-pay

## 2022-08-04 DIAGNOSIS — C661 Malignant neoplasm of right ureter: Secondary | ICD-10-CM | POA: Insufficient documentation

## 2022-08-04 DIAGNOSIS — Z5112 Encounter for antineoplastic immunotherapy: Secondary | ICD-10-CM | POA: Insufficient documentation

## 2022-08-04 DIAGNOSIS — Z510 Encounter for antineoplastic radiation therapy: Secondary | ICD-10-CM | POA: Diagnosis present

## 2022-08-04 LAB — RAD ONC ARIA SESSION SUMMARY
Course Elapsed Days: 46
Plan Fractions Treated to Date: 7
Plan Prescribed Dose Per Fraction: 2 Gy
Plan Total Fractions Prescribed: 10
Plan Total Prescribed Dose: 20 Gy
Reference Point Dosage Given to Date: 59 Gy
Reference Point Session Dosage Given: 2 Gy
Session Number: 32

## 2022-08-08 ENCOUNTER — Other Ambulatory Visit: Payer: Self-pay

## 2022-08-08 ENCOUNTER — Ambulatory Visit
Admission: RE | Admit: 2022-08-08 | Discharge: 2022-08-08 | Disposition: A | Discharge status: Home / Self Care | Payer: Medicare Other | Source: Ambulatory Visit | Attending: Radiation Oncology | Admitting: Radiation Oncology

## 2022-08-08 DIAGNOSIS — Z510 Encounter for antineoplastic radiation therapy: Secondary | ICD-10-CM | POA: Diagnosis not present

## 2022-08-08 LAB — RAD ONC ARIA SESSION SUMMARY
Course Elapsed Days: 50
Plan Fractions Treated to Date: 8
Plan Prescribed Dose Per Fraction: 2 Gy
Plan Total Fractions Prescribed: 10
Plan Total Prescribed Dose: 20 Gy
Reference Point Dosage Given to Date: 61 Gy
Reference Point Session Dosage Given: 2 Gy
Session Number: 33

## 2022-08-09 ENCOUNTER — Other Ambulatory Visit: Payer: Self-pay

## 2022-08-09 ENCOUNTER — Ambulatory Visit
Admission: RE | Admit: 2022-08-09 | Discharge: 2022-08-09 | Disposition: A | Discharge status: Home / Self Care | Payer: Medicare Other | Source: Ambulatory Visit | Attending: Radiation Oncology | Admitting: Radiation Oncology

## 2022-08-09 DIAGNOSIS — Z510 Encounter for antineoplastic radiation therapy: Secondary | ICD-10-CM | POA: Diagnosis not present

## 2022-08-09 LAB — RAD ONC ARIA SESSION SUMMARY
Course Elapsed Days: 51
Plan Fractions Treated to Date: 9
Plan Prescribed Dose Per Fraction: 2 Gy
Plan Total Fractions Prescribed: 10
Plan Total Prescribed Dose: 20 Gy
Reference Point Dosage Given to Date: 63 Gy
Reference Point Session Dosage Given: 2 Gy
Session Number: 34

## 2022-08-10 ENCOUNTER — Other Ambulatory Visit: Payer: Self-pay

## 2022-08-10 ENCOUNTER — Ambulatory Visit
Admission: RE | Admit: 2022-08-10 | Discharge: 2022-08-10 | Disposition: A | Discharge status: Home / Self Care | Payer: Medicare Other | Source: Ambulatory Visit | Attending: Radiation Oncology | Admitting: Radiation Oncology

## 2022-08-10 DIAGNOSIS — Z510 Encounter for antineoplastic radiation therapy: Secondary | ICD-10-CM | POA: Diagnosis not present

## 2022-08-10 LAB — RAD ONC ARIA SESSION SUMMARY
Course Elapsed Days: 52
Plan Fractions Treated to Date: 10
Plan Prescribed Dose Per Fraction: 2 Gy
Plan Total Fractions Prescribed: 10
Plan Total Prescribed Dose: 20 Gy
Reference Point Dosage Given to Date: 65 Gy
Reference Point Session Dosage Given: 2 Gy
Session Number: 35

## 2022-08-15 ENCOUNTER — Inpatient Hospital Stay: Payer: Medicare Other | Attending: Oncology

## 2022-08-15 DIAGNOSIS — G4730 Sleep apnea, unspecified: Secondary | ICD-10-CM | POA: Diagnosis not present

## 2022-08-15 DIAGNOSIS — Z8546 Personal history of malignant neoplasm of prostate: Secondary | ICD-10-CM | POA: Insufficient documentation

## 2022-08-15 DIAGNOSIS — Z87891 Personal history of nicotine dependence: Secondary | ICD-10-CM | POA: Diagnosis not present

## 2022-08-15 DIAGNOSIS — Z5112 Encounter for antineoplastic immunotherapy: Secondary | ICD-10-CM | POA: Diagnosis present

## 2022-08-15 DIAGNOSIS — G4700 Insomnia, unspecified: Secondary | ICD-10-CM | POA: Insufficient documentation

## 2022-08-15 DIAGNOSIS — Z7961 Long term (current) use of immunomodulator: Secondary | ICD-10-CM | POA: Insufficient documentation

## 2022-08-15 DIAGNOSIS — Z923 Personal history of irradiation: Secondary | ICD-10-CM | POA: Insufficient documentation

## 2022-08-15 DIAGNOSIS — Z85828 Personal history of other malignant neoplasm of skin: Secondary | ICD-10-CM | POA: Diagnosis not present

## 2022-08-15 DIAGNOSIS — D631 Anemia in chronic kidney disease: Secondary | ICD-10-CM | POA: Diagnosis not present

## 2022-08-15 DIAGNOSIS — C679 Malignant neoplasm of bladder, unspecified: Secondary | ICD-10-CM | POA: Insufficient documentation

## 2022-08-15 DIAGNOSIS — C9000 Multiple myeloma not having achieved remission: Secondary | ICD-10-CM | POA: Diagnosis not present

## 2022-08-15 DIAGNOSIS — N184 Chronic kidney disease, stage 4 (severe): Secondary | ICD-10-CM | POA: Insufficient documentation

## 2022-08-15 DIAGNOSIS — N3091 Cystitis, unspecified with hematuria: Secondary | ICD-10-CM | POA: Diagnosis not present

## 2022-08-15 DIAGNOSIS — Z79899 Other long term (current) drug therapy: Secondary | ICD-10-CM | POA: Diagnosis not present

## 2022-08-15 DIAGNOSIS — K219 Gastro-esophageal reflux disease without esophagitis: Secondary | ICD-10-CM | POA: Diagnosis not present

## 2022-08-15 DIAGNOSIS — Z8719 Personal history of other diseases of the digestive system: Secondary | ICD-10-CM | POA: Insufficient documentation

## 2022-08-15 DIAGNOSIS — Z7982 Long term (current) use of aspirin: Secondary | ICD-10-CM | POA: Insufficient documentation

## 2022-08-15 LAB — CBC WITH DIFFERENTIAL/PLATELET
Abs Immature Granulocytes: 0.01 10*3/uL (ref 0.00–0.07)
Basophils Absolute: 0 10*3/uL (ref 0.0–0.1)
Basophils Relative: 0 %
Eosinophils Absolute: 0.2 10*3/uL (ref 0.0–0.5)
Eosinophils Relative: 4 %
HCT: 33.7 % — ABNORMAL LOW (ref 39.0–52.0)
Hemoglobin: 11.2 g/dL — ABNORMAL LOW (ref 13.0–17.0)
Immature Granulocytes: 0 %
Lymphocytes Relative: 10 %
Lymphs Abs: 0.5 10*3/uL — ABNORMAL LOW (ref 0.7–4.0)
MCH: 31.4 pg (ref 26.0–34.0)
MCHC: 33.2 g/dL (ref 30.0–36.0)
MCV: 94.4 fL (ref 80.0–100.0)
Monocytes Absolute: 0.5 10*3/uL (ref 0.1–1.0)
Monocytes Relative: 11 %
Neutro Abs: 3.4 10*3/uL (ref 1.7–7.7)
Neutrophils Relative %: 75 %
Platelets: 167 10*3/uL (ref 150–400)
RBC: 3.57 MIL/uL — ABNORMAL LOW (ref 4.22–5.81)
RDW: 13.9 % (ref 11.5–15.5)
WBC: 4.6 10*3/uL (ref 4.0–10.5)
nRBC: 0 % (ref 0.0–0.2)

## 2022-08-15 LAB — COMPREHENSIVE METABOLIC PANEL
ALT: 14 U/L (ref 0–44)
AST: 17 U/L (ref 15–41)
Albumin: 3.6 g/dL (ref 3.5–5.0)
Alkaline Phosphatase: 68 U/L (ref 38–126)
Anion gap: 5 (ref 5–15)
BUN: 42 mg/dL — ABNORMAL HIGH (ref 8–23)
CO2: 26 mmol/L (ref 22–32)
Calcium: 9.5 mg/dL (ref 8.9–10.3)
Chloride: 109 mmol/L (ref 98–111)
Creatinine, Ser: 2.43 mg/dL — ABNORMAL HIGH (ref 0.61–1.24)
GFR, Estimated: 26 mL/min — ABNORMAL LOW (ref >60)
Glucose, Bld: 103 mg/dL — ABNORMAL HIGH (ref 70–99)
Potassium: 4.3 mmol/L (ref 3.5–5.1)
Sodium: 140 mmol/L (ref 135–145)
Total Bilirubin: 0.5 mg/dL (ref 0.3–1.2)
Total Protein: 6 g/dL — ABNORMAL LOW (ref 6.5–8.1)

## 2022-08-16 ENCOUNTER — Other Ambulatory Visit: Payer: Medicare Other

## 2022-08-16 ENCOUNTER — Ambulatory Visit: Payer: Medicare Other | Admitting: Oncology

## 2022-08-16 ENCOUNTER — Ambulatory Visit: Payer: Medicare Other

## 2022-08-16 LAB — KAPPA/LAMBDA LIGHT CHAINS
Kappa free light chain: 119.5 mg/L — ABNORMAL HIGH (ref 3.3–19.4)
Kappa, lambda light chain ratio: 12.85 — ABNORMAL HIGH (ref 0.26–1.65)
Lambda free light chains: 9.3 mg/L (ref 5.7–26.3)

## 2022-08-17 ENCOUNTER — Encounter: Payer: Self-pay | Admitting: Oncology

## 2022-08-17 ENCOUNTER — Inpatient Hospital Stay (HOSPITAL_BASED_OUTPATIENT_CLINIC_OR_DEPARTMENT_OTHER): Payer: Medicare Other | Admitting: Oncology

## 2022-08-17 ENCOUNTER — Other Ambulatory Visit: Payer: Self-pay

## 2022-08-17 ENCOUNTER — Inpatient Hospital Stay: Payer: Medicare Other

## 2022-08-17 VITALS — BP 130/59 | HR 40 | Temp 95.5°F | Resp 20 | Wt 143.8 lb

## 2022-08-17 DIAGNOSIS — C669 Malignant neoplasm of unspecified ureter: Secondary | ICD-10-CM

## 2022-08-17 DIAGNOSIS — C9000 Multiple myeloma not having achieved remission: Secondary | ICD-10-CM

## 2022-08-17 DIAGNOSIS — Z5112 Encounter for antineoplastic immunotherapy: Secondary | ICD-10-CM | POA: Diagnosis not present

## 2022-08-17 DIAGNOSIS — N184 Chronic kidney disease, stage 4 (severe): Secondary | ICD-10-CM

## 2022-08-17 DIAGNOSIS — Z5111 Encounter for antineoplastic chemotherapy: Secondary | ICD-10-CM

## 2022-08-17 DIAGNOSIS — D631 Anemia in chronic kidney disease: Secondary | ICD-10-CM

## 2022-08-17 MED ORDER — ACETAMINOPHEN 325 MG PO TABS
650.0000 mg | ORAL_TABLET | Freq: Once | ORAL | Status: AC
  Administered 2022-08-17: 650 mg via ORAL
  Filled 2022-08-17: qty 2

## 2022-08-17 MED ORDER — DIPHENHYDRAMINE HCL 25 MG PO CAPS
50.0000 mg | ORAL_CAPSULE | Freq: Once | ORAL | Status: AC
  Administered 2022-08-17: 50 mg via ORAL
  Filled 2022-08-17: qty 2

## 2022-08-17 MED ORDER — LENALIDOMIDE 5 MG PO CAPS: 5.0000 mg | ORAL_CAPSULE | ORAL | 0 refills | Status: DC

## 2022-08-17 MED ORDER — DEXAMETHASONE 4 MG PO TABS
20.0000 mg | ORAL_TABLET | Freq: Once | ORAL | Status: AC
  Administered 2022-08-17: 20 mg via ORAL
  Filled 2022-08-17: qty 5

## 2022-08-17 MED ORDER — BORTEZOMIB CHEMO SQ INJECTION 3.5 MG (2.5MG/ML)
1.3000 mg/m2 | Freq: Once | INTRAMUSCULAR | Status: AC
  Administered 2022-08-17: 2.25 mg via SUBCUTANEOUS
  Filled 2022-08-17: qty 0.9

## 2022-08-17 MED ORDER — DARATUMUMAB-HYALURONIDASE-FIHJ 1800-30000 MG-UT/15ML ~~LOC~~ SOLN
1800.0000 mg | Freq: Once | SUBCUTANEOUS | Status: AC
  Administered 2022-08-17: 1800 mg via SUBCUTANEOUS
  Filled 2022-08-17: qty 15

## 2022-08-17 NOTE — Assessment & Plan Note (Addendum)
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 proceed with next cycle of Daratumumab- resume Revlimid 5mg  D1-21 and Velcade- he finished Radiation.  Resume aspirin 81 mg daily for DVT prophylaxis. Continue Acyclovir 200mg  BID  -Bone health, currently he does not have an active myeloma bone involvement.  We discussed about future recommendation of bisphosphonate or denosumab treatments.  Patient reports history of developing adverse reaction after Prolia treatments. -Currently he also has ongoing dental issue need root canal.  Hold off bisphosphonate.

## 2022-08-17 NOTE — Assessment & Plan Note (Signed)
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  He will need distal ureterectomy.  Pending [facility] urology oncology evaluation.  +/- Chemotherapy/RT cystoscopy at [facility] showed ureter high-grade carcinoma as well as bladder high-grade papillary carcinoma. He has another PET at [facility]. Findings were reviewed.  His case was discussed on [facility] neuro-oncology tumor board.  S/p palliative radiation. We discussed a plan of repeating imaging 6 to 8 weeks after finishing radiation.

## 2022-08-17 NOTE — Progress Notes (Signed)
Hematology/Oncology Progress note [phone] [phone]      Patient Care Team: Baxter Hire, MD as PCP - General (Internal Medicine) Noreene Filbert, MD as Radiation Oncologist (Radiation Oncology) Baxter Hire, MD (Internal Medicine) Lavonia Dana, MD as Consulting Physician (Nephrology) [name], MD as Consulting Physician (Oncology)  ASSESSMENT & PLAN:   Cancer Staging  Multiple myeloma [facility]) Staging form: Plasma Cell Myeloma and Plasma Cell Disorders, AJCC 8th Edition - Clinical stage from 03/17/2022: RISS Stage III (Beta-2-microglobulin (mg/L): 6.9, Albumin (g/dL): 3.3, ISS: Stage III, High-risk cytogenetics: Present, LDH: Normal) - Signed by [name], MD on 03/17/2022  Urothelial carcinoma of distal ureter [facility]) Staging form: Renal Pelvis and Ureter, AJCC 8th Edition - Clinical: Stage Unknown (cTX, cN0, cM0) - Signed by [name], MD on 03/17/2022   Multiple myeloma ([place])  #Stage III IgA kappa multiple myeloma, myeloma FISH panel positive for 13q deletion, gain of 1q, t (4;14), high risk, not candidate for autologous bone marrow transplant. 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 proceed with next cycle of Daratumumab- resume Revlimid 100m D1-21 and Velcade- he finished Radiation.  Resume aspirin 81 mg daily for DVT prophylaxis. Continue Acyclovir 202mBID  -Bone health, currently he does not have an active myeloma bone involvement.  We discussed about future recommendation of bisphosphonate or denosumab treatments.  Patient reports history of developing adverse reaction after Prolia treatments. -Currently he also has ongoing dental issue need root canal.  Hold off bisphosphonate.  Encounter for antineoplastic chemotherapy Chemotherapy plan  as listed above  Anemia in stage 4 chronic kidney disease (HCC) Stable hemoglobin. monitor  Stage 4 chronic kidney disease (HCC) Avoid nephrotoxins.  Encourage oral hydration.   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  He will need distal ureterectomy.  [facility]  [facility] bladder high[facility]grade papillary carcinoma. [facility]  [facility].  S/p palliative radiation. We discussed a plan of repeating imaging 6 to 8 weeks after finishing radiation.    Orders Placed This Encounter  Procedures   NM PET Image Restage (PS) Skull Base to Thigh (F-18 FDG)    Standing Status:   Future    Standing Expiration Date:   08/18/2023    Order Specific Question:   If indicated for the ordered procedure, I authorize the administration of a radiopharmaceutical per Radiology protocol    Answer:   Yes    Order Specific Question:   Preferred imaging location?    Answer:   [facility]    Order Specific Question:   Radiology Contrast Protocol - do NOT remove file path    Answer:   [email or web address]     Follow-up 1 week lab velcade 2 weeks lab MD dara velvade All questions were answered. The patient knows to call the clinic with any problems, questions or concerns.  ZhEarlie ServerMD, PhD [facility] 08/17/2022    CHIEF COMPLAINTS/REASON FOR VISIT:  Multiple myeloma, high-grade urothelial carcinoma.  HISTORY OF PRESENTING ILLNESS:  [name] [name] [age] a [age] male who was seen in consultation at the request of JoBaxter HireMD presents  for follow-up of multiple myeloma and high-grade urothelial carcinoma management.    Patient has progressively worsening kidney function.  He  is scheduled for kidney biopsy. 01/26/2022, free kappa light chain 1058, lambda 15.3, light chain ratio 69. Protein electrophoresis showed restricted band-M spike migrating in the beta-1 globulin region. ANA negative.  ANCA negative. Random urine protein electrophoresis showed abnormal protein band detected in the gamma globulin And a second possible abnormal protein band that may represent monoclonal protein.  Patient denies any back pain, unintentional weight loss, night sweats or fever. He lives at home with wife.  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   CMP showed creatinine of 3.41, that is significantly worse than his baseline in June 2022. 02/28/2022, 24-hour urine protein electrophoresis showed M protein of 729 mg, IgA monoclonal kappa light chain. 03/01/2022, [facility] kidney biopsy showed diffuse light chain tubulopathy, and light chain cast nephropathy, moderate interstitial fibrosis,4% global glomerular sclerosis and the marked atherosclerosis.  03/02/2022 PET scan showed no definitive signs of multiple myeloma by FDG PET. Obstructive right ureter with soft tissue mass in the right pelvis showing increased metabolic activity suspicious for urothelial neoplasm.  Little or no excretion of FDG on the RIGHT but still with uptake of FDG by renal parenchyma. Degree of hydronephrosis is not substantially changed but lack of FDG excretion on the RIGHT is compatible with secondary sign of physiologic significance of ureteral obstruction. RIGHT pelvic sidewall uptake without visible lesion, could represent tiny lymph node not visible on noncontrast imaging. If the patient is no longer and acute renal failure could consider MRI with and without contrast for further assessment of pelvic  findings. Ultimately cystoscopy may be helpful for further assessment, aortic atherosclerosis, pulmonary emphysema.   03/06/2022, patient underwent a bone marrow biopsy. Pathology showed hypercellular bone marrow with plasma cell neoplasm, representing 20% of all cells in the aspirate associated with prominent interstitial infiltrates and numerous predominantly small clusters in the clots in the biopsy sections.  The plasma cells display kappa light chain restriction consistent with plasma cell neoplasm. Normal cytogenetics, myeloma FISH panel positive for 13q deletion, gain of 1q, t (4;14)  Patient received 20 mg dexamethasone x1 after bone marrow biopsy.  03/14/2022, cystoscopy showed diffuse narrowing right distal ureter with hydroureter following up the distal ureter and extending proximally.  Right ureteroscopy showed nodular tumor distal ureter and a convincing area without tumor suspicious for extrinsic obstruction.s/p   right ureteral stent placement. Ureteral tumor showed a tiny fragments of fibrous tissue, interpretation limited by thermal and crush artifact.  Right distal ureter saline barbotage showed hight grade urothelial carcinoma.   INTERVAL HISTORY Jasiah Elsen Casamento is a 81 y.o. male who has above history reviewed by me today presents for follow up visit for multiple myeloma and high-grade urothelial carcinoma.  Finished palliative Radiation to Bladder/Pelvis.  He has once time episode of hematuria, Dr.Chrystal has suggested him to stop Aspirin, he has been off since then. No additional hematuria  He has had root canal procedure recently and is on prophylactic course of Augmentin. Pain is better.     Review of Systems  Constitutional:  Positive for fatigue. Negative for appetite change, chills, fever and unexpected weight change.  HENT:   Negative for hearing loss and voice change.   Eyes:  Negative for eye problems and icterus.  Respiratory:  Negative for chest tightness, cough and  shortness of breath.   Cardiovascular:  Negative for chest pain and leg swelling.  Gastrointestinal:  Negative for abdominal distention, abdominal pain and diarrhea.  Endocrine: Negative for hot flashes.  Genitourinary:  Positive for frequency. Negative for difficulty urinating and dysuria.   Musculoskeletal:  Negative for arthralgias.  Skin:  Negative for itching and rash.  Neurological:  Negative for light-headedness and numbness.  Hematological:  Negative for adenopathy. Does not bruise/bleed easily.  Psychiatric/Behavioral:  Negative for confusion.      MEDICAL HISTORY:  Past Medical History:  Diagnosis Date   Age related osteoporosis    Anemia    Barrett's esophagus    Bradycardia    Cancer (HCC)    PROSTATE   Cataract    CKD (chronic kidney disease) stage 3, GFR 30-59 ml/min (HCC)    Colon polyps    DDD (degenerative disc disease), cervical    DDD (degenerative disc disease), lumbar    Femur fracture (HCC)    Right   Fundic gland polyposis of stomach    Fundic gland polyps of stomach, benign    Gastritis    GERD (gastroesophageal reflux disease)    Hematuria    Hemorrhage of rectum and anus    History of colon polyps    Insomnia    Lumbago    Osteopenia of the elderly    Skin cancer    Sleep apnea     SURGICAL HISTORY: Past Surgical History:  Procedure Laterality Date   BAND HEMORRHOIDECTOMY     COLONOSCOPY     COLONOSCOPY WITH PROPOFOL N/A 05/06/2018   Procedure: COLONOSCOPY WITH PROPOFOL;  Surgeon: Lollie Sails, MD;  Location: [facility] ENDOSCOPY;  Service: Endoscopy;  Laterality: N/A;   COLONOSCOPY WITH PROPOFOL N/A 09/23/2018   Procedure: COLONOSCOPY WITH PROPOFOL;  Surgeon: Lollie Sails, MD;  Location: [facility] ENDOSCOPY;  Service: Endoscopy;  Laterality: N/A;   COLONOSCOPY WITH PROPOFOL N/A 03/30/2021   Procedure: COLONOSCOPY WITH PROPOFOL;  Surgeon: Toledo, Benay Pike, MD;  Location: [facility] ENDOSCOPY;  Service: Gastroenterology;  Laterality: N/A;    CYSTOSCOPY W/ URETERAL STENT PLACEMENT Right 03/14/2022   Procedure: CYSTOSCOPY WITH RETROGRADE PYELOGRAM/URETERAL STENT PLACEMENT;  Surgeon: Abbie Sons, MD;  Location: [facility] ORS;  Service: Urology;  Laterality: Right;   ESOPHAGOGASTRODUODENOSCOPY (EGD) WITH PROPOFOL N/A 10/03/2016   Procedure: ESOPHAGOGASTRODUODENOSCOPY (EGD) WITH PROPOFOL;  Surgeon: Lollie Sails, MD;  Location: [facility] ENDOSCOPY;  Service: Endoscopy;  Laterality: N/A;   EYE SURGERY     CATARACTS   EYELID SURGERY     FLEXIBLE SIGMOIDOSCOPY     FRACTURE SURGERY Right 2016   femur   FRACTURE SURGERY     ORIF DISTAL FEMUR FRACTURE     TONSILLECTOMY     TRANSURETHRAL RESECTION OF BLADDER TUMOR N/A 03/14/2022   Procedure: TRANSURETHRAL RESECTION OF BLADDER TUMOR (TURBT);  Surgeon: Abbie Sons, MD;  Location: [facility] ORS;  Service: Urology;  Laterality: N/A;    SOCIAL HISTORY: Social History   Socioeconomic History   Marital status: Married    Spouse name: Not on file   Number of children: Not on file   Years of education: Not on file   Highest education level: Not on file  Occupational History   Not on file  Tobacco Use   Smoking status: Former    Packs/day: 1.00    Years: 15.00    Total pack years: 15.00  Types: Cigarettes    Quit date: 10/03/1974    Years since quitting: 47.9   Smokeless tobacco: Never  Vaping Use   Vaping Use: Never used  Substance and Sexual Activity   Alcohol use: Not Currently    Alcohol/week: 1.0 standard drink of alcohol    Types: 1 Cans of beer per week    Comment: 1 beer every 2 weeks   Drug use: No   Sexual activity: Yes    Birth control/protection: None  Other Topics Concern   Not on file  Social History Narrative   ** Merged History Encounter **       Social Determinants of Health   Financial Resource Strain: Not on file  Food Insecurity: Not on file  Transportation Needs: Not on file  Physical Activity: Not on file  Stress: Not on file  Social  Connections: Not on file  Intimate Partner Violence: Not on file    FAMILY HISTORY: Family History  Problem Relation Age of Onset   Breast cancer Mother     ALLERGIES:  is allergic to demerol [meperidine hcl], prolia [denosumab], demerol [meperidine], and nsaids.  MEDICATIONS:  Current Outpatient Medications  Medication Sig Dispense Refill   acyclovir (ZOVIRAX) 200 MG capsule Take 1 capsule (200 mg total) by mouth 2 (two) times daily. 60 capsule 3   amoxicillin (AMOXIL) 500 MG capsule Take 500 mg by mouth every 8 (eight) hours.     aspirin EC 81 MG tablet Take 1 tablet (81 mg total) by mouth daily. Swallow whole. 30 tablet 11   Calcium Carbonate (CALCIUM 500 PO) Take 1 tablet by mouth daily.     Cholecalciferol (VITAMIN D3) 125 MCG (5000 UT) CAPS Take 5,000 Units by mouth daily.     ferrous sulfate 325 (65 FE) MG tablet Take 325 mg by mouth daily.     ipratropium (ATROVENT) 0.03 % nasal spray Place 1 spray into both nostrils in the morning.     losartan (COZAAR) 25 MG tablet Take 25 mg by mouth daily.     melatonin 5 MG TABS Take 5 mg by mouth at bedtime.     montelukast (SINGULAIR) 10 MG tablet Take 1 tablet (10 mg total) by mouth See admin instructions. Take 1 tablet the day prior to Daratumumab treatments, and take 1 tablet daily for 2 days after treatments 30 tablet 1   Multiple Vitamins-Minerals (MENS 50+ MULTIVITAMIN) TABS Take 1 tablet by mouth daily.     oxybutynin (DITROPAN XL) 10 MG 24 hr tablet Take 1 tablet (10 mg total) by mouth at bedtime. 30 tablet 1   senna (SENOKOT) 8.6 MG TABS tablet Take 2 tablets (17.2 mg total) by mouth daily. 120 tablet 0   traZODone (DESYREL) 50 MG tablet Take 25 mg by mouth at bedtime.     triamcinolone ointment (KENALOG) 0.5 % Apply 1 application. topically 2 (two) times daily. 30 g 0   gabapentin (NEURONTIN) 300 MG capsule Take 1 capsule (300 mg total) by mouth at bedtime. (Patient not taking: Reported on 07/19/2022) 30 capsule 3    HYDROcodone-acetaminophen (NORCO) 7.5-325 MG tablet 0.5-1 tab every 6 hours as needed for pain (Patient not taking: Reported on 07/19/2022) 8 tablet 0   lenalidomide (REVLIMID) 5 MG capsule Take 1 capsule (5 mg total) by mouth See admin instructions. TAKE 1 CAPSULE BY MOUTH ONCE DAILY FOR 21 DAYS ON AND 7 DAYS OFF (Patient not taking: Reported on 07/19/2022) 21 capsule 0   No current facility-administered medications for  this visit.     PHYSICAL EXAMINATION: ECOG PERFORMANCE STATUS: 1 - Symptomatic but completely ambulatory Today's Vitals   08/17/22 0910  BP: (!) 130/59  Pulse: (!) 40  Resp: 20  Temp: (!) 95.5 F (35.3 C)  SpO2: 98%  Weight: 143 lb 12.8 oz (65.2 kg)   Body mass index is 22.52 kg/m.   Physical Exam Constitutional:      General: He is not in acute distress. HENT:     Head: Normocephalic and atraumatic.  Eyes:     General: No scleral icterus. Cardiovascular:     Rate and Rhythm: Normal rate and regular rhythm.     Heart sounds: Normal heart sounds.  Pulmonary:     Effort: Pulmonary effort is normal. No respiratory distress.     Breath sounds: No wheezing.  Abdominal:     General: Bowel sounds are normal. There is no distension.     Palpations: Abdomen is soft.  Musculoskeletal:        General: No deformity. Normal range of motion.     Cervical back: Normal range of motion and neck supple.  Skin:    General: Skin is warm and dry.     Findings: No erythema or rash.  Neurological:     Mental Status: He is alert and oriented to person, place, and time. Mental status is at baseline.     Cranial Nerves: No cranial nerve deficit.     Coordination: Coordination normal.  Psychiatric:        Mood and Affect: Mood normal.      LABORATORY DATA:  I have reviewed the data as listed    Latest Ref Rng & Units 08/15/2022    9:36 AM 08/02/2022   10:03 AM 07/19/2022    9:19 AM  CBC  WBC 4.0 - 10.5 K/uL 4.6  3.9  3.7   Hemoglobin 13.0 - 17.0 g/dL 11.2  11.0  10.5    Hematocrit 39.0 - 52.0 % 33.7  33.2  32.6   Platelets 150 - 400 K/uL 167  167  153        Latest Ref Rng & Units 08/15/2022    9:36 AM 08/02/2022   10:03 AM 07/19/2022    9:19 AM  CMP  Glucose 70 - 99 mg/dL 103  93  84   BUN 8 - 23 mg/dL 42  31  42   Creatinine 0.61 - 1.24 mg/dL 2.43  2.26  2.80   Sodium 135 - 145 mmol/L 140  137  137   Potassium 3.5 - 5.1 mmol/L 4.3  4.3  4.3   Chloride 98 - 111 mmol/L 109  106  106   CO2 22 - 32 mmol/L _0 Calcium 8.9 - 10.3 mg/dL 9.5  8.6  8.6   Total Protein 6.5 - 8.1 g/dL 6.0  5.8  6.1   Total Bilirubin 0.3 - 1.2 mg/dL 0.5  0.6  0.6   Alkaline Phos 38 - 126 U/L 68  62  66   AST 15 - 41 U/L _1 ALT 0 - 44 U/L _2 Iron/TIBC/Ferritin/ %Sat    Component Value Date/Time   IRON 60 02/23/2022 1523   TIBC 238 (L) 02/23/2022 1523   FERRITIN 86 02/23/2022 1523   IRONPCTSAT 25 02/23/2022 1523       RADIOGRAPHIC STUDIES: I have personally reviewed the radiological images as listed and  agreed with the findings in the report.  US Venous Img Lower Bilateral  Result Date: 06/28/2022 CLINICAL DATA:  Bilateral lower extremity pain and edema for the past 3 days. History of multiple myeloma and bladder cancer. Evaluate for DVT. EXAM: BILATERAL LOWER EXTREMITY VENOUS DOPPLER ULTRASOUND TECHNIQUE: Gray-scale sonography with graded compression, as well as color Doppler and duplex ultrasound were performed to evaluate the lower extremity deep venous systems from the level of the common femoral vein and including the common femoral, femoral, profunda femoral, popliteal and calf veins including the posterior tibial, peroneal and gastrocnemius veins when visible. The superficial great saphenous vein was also interrogated. Spectral Doppler was utilized to evaluate flow at rest and with distal augmentation maneuvers in the common femoral, femoral and popliteal veins. COMPARISON:  None Available. FINDINGS: RIGHT LOWER EXTREMITY Common  Femoral Vein: No evidence of thrombus. Normal compressibility, respiratory phasicity and response to augmentation. Saphenofemoral Junction: No evidence of thrombus. Normal compressibility and flow on color Doppler imaging. Profunda Femoral Vein: No evidence of thrombus. Normal compressibility and flow on color Doppler imaging. Femoral Vein: No evidence of thrombus. Normal compressibility, respiratory phasicity and response to augmentation. Popliteal Vein: No evidence of thrombus. Normal compressibility, respiratory phasicity and response to augmentation. Calf Veins: No evidence of thrombus. Normal compressibility and flow on color Doppler imaging. Superficial Great Saphenous Vein: No evidence of thrombus. Normal compressibility. Other Findings:  None. LEFT LOWER EXTREMITY Common Femoral Vein: No evidence of thrombus. Normal compressibility, respiratory phasicity and response to augmentation. Saphenofemoral Junction: No evidence of thrombus. Normal compressibility and flow on color Doppler imaging. Profunda Femoral Vein: No evidence of thrombus. Normal compressibility and flow on color Doppler imaging. Femoral Vein: No evidence of thrombus. Normal compressibility, respiratory phasicity and response to augmentation. Popliteal Vein: No evidence of thrombus. Normal compressibility, respiratory phasicity and response to augmentation. Calf Veins: No evidence of thrombus. Normal compressibility and flow on color Doppler imaging. Superficial Great Saphenous Vein: No evidence of thrombus. Normal compressibility. Other Findings:  None. IMPRESSION: No evidence of DVT within either lower extremity. Electronically Signed   By: Sandi Mariscal M.D.   On: 06/28/2022 09:40   DG Chest 2 View  Result Date: 06/22/2022 CLINICAL DATA:  Cough. EXAM: CHEST - 2 VIEW COMPARISON:  May 15, 2022. FINDINGS: The heart size and mediastinal contours are within normal limits. Both lungs are clear. The visualized skeletal structures are  unremarkable. IMPRESSION: No active cardiopulmonary disease. Electronically Signed   By: Marijo Conception M.D.   On: 06/22/2022 16:45

## 2022-08-17 NOTE — Assessment & Plan Note (Signed)
Stable hemoglobin. monitor 

## 2022-08-17 NOTE — Assessment & Plan Note (Signed)
Chemotherapy plan as listed above 

## 2022-08-17 NOTE — Assessment & Plan Note (Signed)
Avoid nephrotoxins.  Encourage oral hydration  

## 2022-08-21 ENCOUNTER — Telehealth: Payer: Self-pay | Admitting: *Deleted

## 2022-08-21 ENCOUNTER — Other Ambulatory Visit: Payer: Self-pay

## 2022-08-21 DIAGNOSIS — C669 Malignant neoplasm of unspecified ureter: Secondary | ICD-10-CM

## 2022-08-21 DIAGNOSIS — R319 Hematuria, unspecified: Secondary | ICD-10-CM

## 2022-08-21 LAB — MULTIPLE MYELOMA PANEL, SERUM
Albumin SerPl Elph-Mcnc: 3.5 g/dL (ref 2.9–4.4)
Albumin/Glob SerPl: 1.7 (ref 0.7–1.7)
Alpha 1: 0.2 g/dL (ref 0.0–0.4)
Alpha2 Glob SerPl Elph-Mcnc: 0.6 g/dL (ref 0.4–1.0)
B-Globulin SerPl Elph-Mcnc: 0.9 g/dL (ref 0.7–1.3)
Gamma Glob SerPl Elph-Mcnc: 0.4 g/dL (ref 0.4–1.8)
Globulin, Total: 2.1 g/dL — ABNORMAL LOW (ref 2.2–3.9)
IgA: 354 mg/dL (ref 61–437)
IgG (Immunoglobin G), Serum: 467 mg/dL — ABNORMAL LOW (ref 603–1613)
IgM (Immunoglobulin M), Srm: 31 mg/dL (ref 15–143)
M Protein SerPl Elph-Mcnc: 0.4 g/dL — ABNORMAL HIGH
Total Protein ELP: 5.6 g/dL — ABNORMAL LOW (ref 6.0–8.5)

## 2022-08-21 NOTE — Telephone Encounter (Signed)
Called and spoke to patient. Informed him that per Dr. Tasia Catchings, he needs to stop ASA until seen in the clinic. He is agreeable to labs/smc. Appointments scheduled for 9/19. Discussed ED triggers. Pt verbalized understanding.

## 2022-08-21 NOTE — Telephone Encounter (Signed)
Patient called reporting that he is having"side effects from radiation therapy", He states he completed 35 radiation therapy treatments a week ago on his bladder .  he is passing blood in his urine and is asking why it is happening and what can be done for it..  Per patient, this started today. He reports some  burning and difficulty passing urine. "I feel like my penis is swollen and it is making my stream go sideways" He has restarted ASA, Velcade, and Revlimid for his Multiple Myeloma. He has had one episode of this in August and Dr C stopped his ASA, but he restarted ASA 9/14. He reports that he has passed blood twice today very bright red in color. And states that he also passed a piece of tissue today.  Please advise.

## 2022-08-22 ENCOUNTER — Encounter: Payer: Self-pay | Admitting: Hospice and Palliative Medicine

## 2022-08-22 ENCOUNTER — Inpatient Hospital Stay (HOSPITAL_BASED_OUTPATIENT_CLINIC_OR_DEPARTMENT_OTHER): Payer: Medicare Other | Admitting: Hospice and Palliative Medicine

## 2022-08-22 ENCOUNTER — Inpatient Hospital Stay: Payer: Medicare Other

## 2022-08-22 ENCOUNTER — Other Ambulatory Visit: Payer: Self-pay

## 2022-08-22 ENCOUNTER — Other Ambulatory Visit: Payer: Self-pay | Admitting: *Deleted

## 2022-08-22 VITALS — BP 142/50 | HR 94 | Temp 98.2°F | Resp 20 | Ht 67.0 in | Wt 144.3 lb

## 2022-08-22 DIAGNOSIS — N390 Urinary tract infection, site not specified: Secondary | ICD-10-CM | POA: Diagnosis not present

## 2022-08-22 DIAGNOSIS — R319 Hematuria, unspecified: Secondary | ICD-10-CM

## 2022-08-22 DIAGNOSIS — C669 Malignant neoplasm of unspecified ureter: Secondary | ICD-10-CM

## 2022-08-22 DIAGNOSIS — Z5112 Encounter for antineoplastic immunotherapy: Secondary | ICD-10-CM | POA: Diagnosis not present

## 2022-08-22 LAB — CBC WITH DIFFERENTIAL/PLATELET
Abs Immature Granulocytes: 0.02 10*3/uL (ref 0.00–0.07)
Basophils Absolute: 0 10*3/uL (ref 0.0–0.1)
Basophils Relative: 0 %
Eosinophils Absolute: 0.3 10*3/uL (ref 0.0–0.5)
Eosinophils Relative: 7 %
HCT: 34.5 % — ABNORMAL LOW (ref 39.0–52.0)
Hemoglobin: 11.3 g/dL — ABNORMAL LOW (ref 13.0–17.0)
Immature Granulocytes: 0 %
Lymphocytes Relative: 9 %
Lymphs Abs: 0.4 10*3/uL — ABNORMAL LOW (ref 0.7–4.0)
MCH: 31.2 pg (ref 26.0–34.0)
MCHC: 32.8 g/dL (ref 30.0–36.0)
MCV: 95.3 fL (ref 80.0–100.0)
Monocytes Absolute: 0.5 10*3/uL (ref 0.1–1.0)
Monocytes Relative: 11 %
Neutro Abs: 3.4 10*3/uL (ref 1.7–7.7)
Neutrophils Relative %: 73 %
Platelets: 158 10*3/uL (ref 150–400)
RBC: 3.62 MIL/uL — ABNORMAL LOW (ref 4.22–5.81)
RDW: 14.1 % (ref 11.5–15.5)
WBC: 4.7 10*3/uL (ref 4.0–10.5)
nRBC: 0 % (ref 0.0–0.2)

## 2022-08-22 LAB — URINALYSIS, COMPLETE (UACMP) WITH MICROSCOPIC
Bilirubin Urine: NEGATIVE
Glucose, UA: NEGATIVE mg/dL
Ketones, ur: NEGATIVE mg/dL
Nitrite: NEGATIVE
Protein, ur: 30 mg/dL — AB
Specific Gravity, Urine: 1.017 (ref 1.005–1.030)
pH: 5 (ref 5.0–8.0)

## 2022-08-22 MED ORDER — CIPROFLOXACIN HCL 250 MG PO TABS: 250.0000 mg | ORAL_TABLET | Freq: Two times a day (BID) | ORAL | 0 refills | Status: DC

## 2022-08-22 NOTE — Progress Notes (Signed)
Symptom Management [place] at [facility] Telephone:([phone]) [phone] [phone]  Patient Care Team: Baxter Hire, MD as PCP - General (Internal Medicine) Noreene Filbert, MD as Radiation Oncologist (Radiation Oncology) Baxter Hire, MD (Internal Medicine) Lavonia Dana, MD as Consulting Physician (Nephrology) [name] Server, MD as Consulting Physician (Oncology)   NAME OF PATIENT: [name] [name]  [id number]  [birth year]   DATE OF VISIT: 08/22/22  REASON FOR CONSULT: [name] [name] is a 81 y.o. 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  He is status post right ureteral stenting.  Patient has been on treatment with daratumumab and Revlimid and Velcade.   Patient is pending [facility] urology oncology evaluation for urothelial cancer.  He is status post XRT.  Patient presents to [facility] for evaluation of hematuria.  Patient states that he had several episodes of red blood noted in urine without clots.  This proceeded to clear with urination throughout the day yesterday and he denies recurrence today.  He denies fever or chills.  No suprapubic or back pain.  No dysuria although he reports some penile discomfort/swelling.  No penile drainage or discharge.  Denies any nausea, vomiting, constipation, or diarrhea. Denies urinary complaints. Patient offers no further specific complaints today.   PAST MEDICAL HISTORY: Past Medical History:  Diagnosis Date   Age related osteoporosis    Anemia    Barrett's esophagus    Bradycardia    Cancer (HCC)    PROSTATE   Cataract    CKD (chronic kidney disease) stage 3, GFR 30-59 ml/min (HCC)    Colon polyps    DDD (degenerative disc disease), cervical    DDD (degenerative disc disease), lumbar    Femur fracture (HCC)    Right   Fundic gland polyposis of stomach    Fundic gland polyps of stomach,  benign    Gastritis    GERD (gastroesophageal reflux disease)    Hematuria    Hemorrhage of rectum and anus    History of colon polyps    Insomnia    Lumbago    Osteopenia of the elderly    Skin cancer    Sleep apnea     PAST SURGICAL HISTORY:  Past Surgical History:  Procedure Laterality Date   BAND HEMORRHOIDECTOMY     COLONOSCOPY     COLONOSCOPY WITH PROPOFOL N/A 05/06/2018   Procedure: COLONOSCOPY WITH PROPOFOL;  Surgeon: Lollie Sails, MD;  Location: [facility] ENDOSCOPY;  Service: Endoscopy;  Laterality: N/A;   COLONOSCOPY WITH PROPOFOL N/A 09/23/2018   Procedure: COLONOSCOPY WITH PROPOFOL;  Surgeon: Lollie Sails, MD;  Location: [facility] ENDOSCOPY;  Service: Endoscopy;  Laterality: N/A;   COLONOSCOPY WITH PROPOFOL N/A 03/30/2021   Procedure: COLONOSCOPY WITH PROPOFOL;  Surgeon: Toledo, Benay Pike, MD;  Location: [facility] ENDOSCOPY;  Service: Gastroenterology;  Laterality: N/A;   CYSTOSCOPY W/ URETERAL STENT PLACEMENT Right 03/14/2022   Procedure: CYSTOSCOPY WITH RETROGRADE PYELOGRAM/URETERAL STENT PLACEMENT;  Surgeon: Abbie Sons, MD;  Location: [facility] ORS;  Service: Urology;  Laterality: Right;   ESOPHAGOGASTRODUODENOSCOPY (EGD) WITH PROPOFOL N/A 10/03/2016   Procedure: ESOPHAGOGASTRODUODENOSCOPY (EGD) WITH PROPOFOL;  Surgeon: Lollie Sails, MD;  Location: [facility] ENDOSCOPY;  Service: Endoscopy;  Laterality: N/A;   EYE SURGERY     CATARACTS   EYELID SURGERY     FLEXIBLE SIGMOIDOSCOPY     FRACTURE SURGERY Right 2016   femur   FRACTURE  SURGERY     ORIF DISTAL FEMUR FRACTURE     TONSILLECTOMY     TRANSURETHRAL RESECTION OF BLADDER TUMOR N/A 03/14/2022   Procedure: TRANSURETHRAL RESECTION OF BLADDER TUMOR (TURBT);  Surgeon: Abbie Sons, MD;  Location: [facility] ORS;  Service: Urology;  Laterality: N/A;    HEMATOLOGY/ONCOLOGY HISTORY:  Oncology History  Multiple myeloma ([place])  03/17/2022 Initial Diagnosis   Multiple myeloma ([place])   03/17/2022 Cancer Staging   Staging  form: Plasma Cell Myeloma and Plasma Cell Disorders, AJCC 8th Edition - Clinical stage from 03/17/2022: RISS Stage III (Beta-2-microglobulin (mg/L): 6.9, Albumin (g/dL): 3.3, ISS: Stage III, High-risk cytogenetics: Present, LDH: Normal) - Signed by [name] Server, MD on 03/17/2022 Stage prefix: Initial diagnosis Beta 2 microglobulin range (mg/L): Greater than or equal to 5.5 Albumin range (g/dL): Less than 3.5 Cytogenetics: t(4;14) translocation, Other mutation, 1q addition   03/17/2022 - 03/17/2022 Chemotherapy   Patient is on Treatment Plan : MYELOMA NEWLY DIAGNOSED NON-TRANSPLANT CANDIDATE CyBorD q28d x 8 cycles     03/24/2022 - 07/19/2022 Chemotherapy   Patient is on Treatment Plan : MYELOMA NEWLY DIAGNOSED Daratumumab + Dexamethasone Weekly (DaraRd) q28d     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  2.There is persistent abnormal hypermetabolic tissue extending posterior lateral from the right aspect of the bladder encasing the distal ureter/stent which measures approximately 5.1 x 4.2 (CT image 243).  3.There are similar linear areas of abnormal hypermetabolic tissue seen to extend more superiorly along the right posterior pelvic wall/peritoneum, and towards the sciatic notch (fused image 238). Linear configuration suggests possible perineural spread. Note that this soft tissue abuts and may encase pelvic vasculature including the external and internal iliac arteries.   4.NEW single subcentimeter but abnormal focus of FDG avidity in the right presacral region (CT image 231) measuring 1.6 cm representing new site of neoplastic disease   06/19/2022 - 08/10/2022 Radiation Therapy   Pelvis RT 06/19/22-08/21/22 Bladder RT 07/27/22- 08/20/22      ALLERGIES:  is allergic to demerol [meperidine hcl], prolia [denosumab], demerol [meperidine], and nsaids.  MEDICATIONS:  Current Outpatient Medications  Medication Sig Dispense Refill   acyclovir (ZOVIRAX) 200 MG capsule Take 1 capsule (200 mg total) by mouth 2 (two) times daily. 60 capsule 3   amoxicillin (AMOXIL) 500 MG capsule Take 500 mg by mouth every 8 (eight) hours.     Calcium Carbonate (CALCIUM 500 PO) Take 1 tablet by mouth daily.     Cholecalciferol (VITAMIN D3) 125 MCG (5000 UT) CAPS Take 5,000 Units by mouth daily.     ferrous sulfate 325 (65 FE) MG tablet Take 325 mg by mouth daily.     ipratropium (ATROVENT) 0.03 % nasal spray Place 1 spray into both nostrils in the morning.     lenalidomide (REVLIMID) 5 MG capsule Take 1 capsule (5 mg total) by mouth See admin instructions. TAKE 1 CAPSULE BY MOUTH ONCE DAILY FOR 21 DAYS ON AND 7 DAYS OFF 21 capsule 0   losartan (COZAAR) 25  MG tablet Take 25 mg by mouth daily.     melatonin 5 MG TABS Take 5 mg by mouth at bedtime.     montelukast (SINGULAIR) 10 MG tablet Take 1 tablet (10 mg total) by mouth See admin instructions. Take 1 tablet the day prior to Daratumumab treatments, and take 1 tablet daily for 2 days after treatments 30 tablet 1   Multiple Vitamins-Minerals (MENS 50+ MULTIVITAMIN) TABS Take 1 tablet by mouth daily.     oxybutynin (DITROPAN XL) 10 MG 24 hr tablet Take 1 tablet (10 mg total) by mouth at bedtime. 30 tablet 1   senna (SENOKOT) 8.6 MG TABS tablet Take 2 tablets (17.2 mg total) by mouth daily. 120 tablet 0   traZODone (DESYREL) 50 MG tablet Take 25 mg by mouth at bedtime.     aspirin EC 81 MG tablet Take 1 tablet (81 mg total) by mouth daily.  Swallow whole. (Patient not taking: Reported on 08/22/2022) 30 tablet 11   No current facility-administered medications for this visit.    VITAL SIGNS: BP (!) 142/50   Pulse 94   Temp 98.2 F (36.8 C) (Tympanic)   Resp 20   Ht '5\' 7"'  (1.702 m)   Wt 144 lb 4.8 oz (65.5 kg)   SpO2 100%   BMI 22.60 kg/m  Filed Weights   08/22/22 1000  Weight: 144 lb 4.8 oz (65.5 kg)    Estimated body mass index is 22.6 kg/m as calculated from the following:   Height as of this encounter: '5\' 7"'  (1.702 m).   Weight as of this encounter: 144 lb 4.8 oz (65.5 kg).  LABS: CBC:    Component Value Date/Time   WBC 4.7 08/22/2022 0917   HGB 11.3 (L) 08/22/2022 0917   HGB 14.4 03/25/2015 0531   HCT 34.5 (L) 08/22/2022 0917   HCT 44.3 03/25/2015 0531   PLT 158 08/22/2022 0917   PLT 142 (L) 03/25/2015 0531   MCV 95.3 08/22/2022 0917   MCV 88 03/25/2015 0531   NEUTROABS 3.4 08/22/2022 0917   NEUTROABS 6.3 03/25/2015 0531   LYMPHSABS 0.4 (L) 08/22/2022 0917   LYMPHSABS 0.8 (L) 03/25/2015 0531   MONOABS 0.5 08/22/2022 0917   MONOABS 0.8 03/25/2015 0531   EOSABS 0.3 08/22/2022 0917   EOSABS 0.4 03/25/2015 0531   BASOSABS 0.0 08/22/2022 0917   BASOSABS 0.0 03/25/2015 0531   Comprehensive Metabolic Panel:    Component Value Date/Time   NA 140 08/15/2022 0936   NA 139 03/24/2015 0645   K 4.3 08/15/2022 0936   K 4.4 03/24/2015 0645   CL 109 08/15/2022 0936   CL 108 03/24/2015 0645   CO2 26 08/15/2022 0936   CO2 24 03/24/2015 0645   BUN 42 (H) 08/15/2022 0936   BUN 20 03/24/2015 0645   CREATININE 2.43 (H) 08/15/2022 0936   CREATININE 1.24 03/24/2015 0645   GLUCOSE 103 (H) 08/15/2022 0936   GLUCOSE 113 (H) 03/24/2015 0645   CALCIUM 9.5 08/15/2022 0936   CALCIUM 8.2 (L) 03/24/2015 0645   AST 17 08/15/2022 0936   AST 26 03/22/2015 1108   ALT 14 08/15/2022 0936   ALT 21 03/22/2015 1108   ALKPHOS 68 08/15/2022 0936   ALKPHOS 70 03/22/2015 1108   BILITOT 0.5 08/15/2022 0936   BILITOT 0.8  03/22/2015 1108   PROT 6.0 (L) 08/15/2022 0936   PROT 6.9 03/22/2015 1108   ALBUMIN 3.6 08/15/2022 0936   ALBUMIN 3.9 03/22/2015 1108    RADIOGRAPHIC STUDIES: No  results found.  PERFORMANCE STATUS (ECOG) : 1 - Symptomatic but completely ambulatory  Review of Systems Unless otherwise noted, a complete review of systems is negative.  Physical Exam General: NAD Cardiovascular: regular rate and rhythm Pulmonary: clear ant fields Abdomen: soft, nontender, + bowel sounds GU: no suprapubic tenderness, penile exam deferred Extremities: no edema, no joint deformities Skin: no rashes Neurological: Nonfocal  IMPRESSION/PLAN: Cystitis -UA appears consistent with infection.  Also would be potentially concerned about radiation cystitis but will cover for infectious etiology.  Patient has been on amoxicillin for 1 week following a dental procedure last Wednesday.  We will rotate to Cipro x5 days.  Discussed possible follow-up with urology at [facility] if hematuria recurs.  Otherwise, we will bring patient back next week for repeat labs and to assess symptoms.  Advised patient to hold ASA x3 days.  Case and plan discussed with Dr. Tasia Catchings.   Patient expressed understanding and was in agreement with this plan. He also understands that He can call clinic at any time with any questions, concerns, or complaints.   Thank you for allowing me to participate in the care of this very pleasant patient.   Time Total: 15 minutes  Visit consisted of counseling and education dealing with the complex and emotionally intense issues of symptom management in the setting of serious illness.Greater than 50%  of this time was spent counseling and coordinating care related to the above assessment and plan.  Signed by: Altha Harm, PhD, NP-C

## 2022-08-23 ENCOUNTER — Ambulatory Visit: Payer: Medicare Other

## 2022-08-23 ENCOUNTER — Other Ambulatory Visit: Payer: Medicare Other

## 2022-08-23 LAB — URINE CULTURE: Culture: NO GROWTH

## 2022-08-24 ENCOUNTER — Inpatient Hospital Stay: Payer: Medicare Other

## 2022-08-24 ENCOUNTER — Other Ambulatory Visit: Payer: Self-pay | Admitting: Oncology

## 2022-08-24 VITALS — BP 134/41 | HR 51 | Temp 97.0°F | Resp 18 | Wt 143.5 lb

## 2022-08-24 DIAGNOSIS — Z5112 Encounter for antineoplastic immunotherapy: Secondary | ICD-10-CM | POA: Diagnosis not present

## 2022-08-24 DIAGNOSIS — C9000 Multiple myeloma not having achieved remission: Secondary | ICD-10-CM

## 2022-08-24 LAB — COMPREHENSIVE METABOLIC PANEL
ALT: 17 U/L (ref 0–44)
AST: 22 U/L (ref 15–41)
Albumin: 3.5 g/dL (ref 3.5–5.0)
Alkaline Phosphatase: 69 U/L (ref 38–126)
Anion gap: 6 (ref 5–15)
BUN: 29 mg/dL — ABNORMAL HIGH (ref 8–23)
CO2: 26 mmol/L (ref 22–32)
Calcium: 8.1 mg/dL — ABNORMAL LOW (ref 8.9–10.3)
Chloride: 107 mmol/L (ref 98–111)
Creatinine, Ser: 2.46 mg/dL — ABNORMAL HIGH (ref 0.61–1.24)
GFR, Estimated: 26 mL/min — ABNORMAL LOW (ref >60)
Glucose, Bld: 123 mg/dL — ABNORMAL HIGH (ref 70–99)
Potassium: 3.8 mmol/L (ref 3.5–5.1)
Sodium: 139 mmol/L (ref 135–145)
Total Bilirubin: 0.7 mg/dL (ref 0.3–1.2)
Total Protein: 6 g/dL — ABNORMAL LOW (ref 6.5–8.1)

## 2022-08-24 LAB — CBC WITH DIFFERENTIAL/PLATELET
Abs Immature Granulocytes: 0.01 10*3/uL (ref 0.00–0.07)
Basophils Absolute: 0 10*3/uL (ref 0.0–0.1)
Basophils Relative: 0 %
Eosinophils Absolute: 0.4 10*3/uL (ref 0.0–0.5)
Eosinophils Relative: 8 %
HCT: 34 % — ABNORMAL LOW (ref 39.0–52.0)
Hemoglobin: 11.4 g/dL — ABNORMAL LOW (ref 13.0–17.0)
Immature Granulocytes: 0 %
Lymphocytes Relative: 8 %
Lymphs Abs: 0.4 10*3/uL — ABNORMAL LOW (ref 0.7–4.0)
MCH: 31.4 pg (ref 26.0–34.0)
MCHC: 33.5 g/dL (ref 30.0–36.0)
MCV: 93.7 fL (ref 80.0–100.0)
Monocytes Absolute: 0.5 10*3/uL (ref 0.1–1.0)
Monocytes Relative: 10 %
Neutro Abs: 3.4 10*3/uL (ref 1.7–7.7)
Neutrophils Relative %: 74 %
Platelets: 168 10*3/uL (ref 150–400)
RBC: 3.63 MIL/uL — ABNORMAL LOW (ref 4.22–5.81)
RDW: 13.9 % (ref 11.5–15.5)
WBC: 4.7 10*3/uL (ref 4.0–10.5)
nRBC: 0 % (ref 0.0–0.2)

## 2022-08-24 MED ORDER — DEXAMETHASONE 4 MG PO TABS
20.0000 mg | ORAL_TABLET | Freq: Every day | ORAL | Status: DC
  Administered 2022-08-24: 20 mg via ORAL
  Filled 2022-08-24: qty 5

## 2022-08-24 MED ORDER — BORTEZOMIB CHEMO SQ INJECTION 3.5 MG (2.5MG/ML)
1.3000 mg/m2 | Freq: Once | INTRAMUSCULAR | Status: AC
  Administered 2022-08-24: 2.25 mg via SUBCUTANEOUS
  Filled 2022-08-24: qty 0.9

## 2022-08-24 MED ORDER — PROCHLORPERAZINE MALEATE 10 MG PO TABS: 10.0000 mg | ORAL_TABLET | Freq: Once | ORAL | Status: DC

## 2022-08-24 NOTE — Progress Notes (Signed)
Per pt, pt saw Burnell Blanks NP 2 days ago and started Ciprofloxacin until 08/26/22 (for UTI) and stopped Asprin until tomorrow. Pt reports swelling in Penis has improved but still present and states no visible blood in urine. Per Dr. Tasia Catchings okay to proceed with Velcade, stop Ciprofloxacin. Pt aware and voices concern about stopping Ciprofloxacin. Per Dr. Tasia Catchings pt can finish upon pt request but recommends stopping it and for pt to schedule appt with urologist. Pt aware and verbalizes understanding.

## 2022-08-24 NOTE — Patient Instructions (Signed)
[facility] [place]  Discharge Instructions: Thank you for choosing [facility] to provide your oncology and hematology care.  If you have a lab appointment with the Cancer Center, please go directly to the Cancer Center and check in at the registration area.  Wear comfortable clothing and clothing appropriate for easy access to any Portacath or PICC line.   We strive to give you quality time with your provider. You may need to reschedule your appointment if you arrive late (15 or more minutes).  Arriving late affects you and other patients whose appointments are after yours.  Also, if you miss three or more appointments without notifying the office, you may be dismissed from the clinic at the provider's discretion.      For prescription refill requests, have your pharmacy contact our office and allow 72 hours for refills to be completed.    Today you received the following chemotherapy and/or immunotherapy agents Velcade      To help prevent nausea and vomiting after your treatment, we encourage you to take your nausea medication as directed.  BELOW ARE SYMPTOMS THAT SHOULD BE REPORTED IMMEDIATELY: *FEVER GREATER THAN 100.4 F (38 C) OR HIGHER *CHILLS OR SWEATING *NAUSEA AND VOMITING THAT IS NOT CONTROLLED WITH YOUR NAUSEA MEDICATION *UNUSUAL SHORTNESS OF BREATH *UNUSUAL BRUISING OR BLEEDING *URINARY PROBLEMS (pain or burning when urinating, or frequent urination) *BOWEL PROBLEMS (unusual diarrhea, constipation, pain near the anus) TENDERNESS IN MOUTH AND THROAT WITH OR WITHOUT PRESENCE OF ULCERS (sore throat, sores in mouth, or a toothache) UNUSUAL RASH, SWELLING OR PAIN  UNUSUAL VAGINAL DISCHARGE OR ITCHING   Items with * indicate a potential emergency and should be followed up as soon as possible or go to the Emergency Department if any problems should occur.  Please show the CHEMOTHERAPY ALERT CARD or IMMUNOTHERAPY ALERT CARD at check-in to the  Emergency Department and triage nurse.  Should you have questions after your visit or need to cancel or reschedule your appointment, please contact [facility] [place]  [phone] and follow the prompts.  Office hours are 8:00 a.m. to 4:30 p.m. Monday - Friday. Please note that voicemails left after 4:00 p.m. may not be returned until the following business day.  We are closed weekends and major holidays. You have access to a nurse at all times for urgent questions. Please call the main number to the clinic [phone] and follow the prompts.  For any non-urgent questions, you may also contact your provider using MyChart. We now offer e-Visits for anyone 18 and older to request care online for non-urgent symptoms. For details visit [email or web address].   Also download the MyChart app! Go to the app store, search "MyChart", open the app, select [facility], and log in with your MyChart username and password.  Masks are optional in the cancer centers. If you would like for your care team to wear a mask while they are taking care of you, please let them know. For doctor visits, patients may have with them one support person who is at least 83 years old. At this time, visitors are not allowed in the infusion area.   

## 2022-08-30 ENCOUNTER — Ambulatory Visit: Payer: Medicare Other

## 2022-08-30 ENCOUNTER — Other Ambulatory Visit: Payer: Medicare Other

## 2022-08-31 ENCOUNTER — Inpatient Hospital Stay: Payer: Medicare Other

## 2022-08-31 ENCOUNTER — Encounter: Payer: Self-pay | Admitting: Oncology

## 2022-08-31 ENCOUNTER — Inpatient Hospital Stay (HOSPITAL_BASED_OUTPATIENT_CLINIC_OR_DEPARTMENT_OTHER): Payer: Medicare Other | Admitting: Oncology

## 2022-08-31 VITALS — BP 123/46 | HR 50 | Temp 97.8°F | Resp 20 | Wt 143.6 lb

## 2022-08-31 DIAGNOSIS — C9000 Multiple myeloma not having achieved remission: Secondary | ICD-10-CM

## 2022-08-31 DIAGNOSIS — N184 Chronic kidney disease, stage 4 (severe): Secondary | ICD-10-CM | POA: Diagnosis not present

## 2022-08-31 DIAGNOSIS — R319 Hematuria, unspecified: Secondary | ICD-10-CM

## 2022-08-31 DIAGNOSIS — Z5111 Encounter for antineoplastic chemotherapy: Secondary | ICD-10-CM | POA: Diagnosis not present

## 2022-08-31 DIAGNOSIS — D631 Anemia in chronic kidney disease: Secondary | ICD-10-CM

## 2022-08-31 DIAGNOSIS — Z5112 Encounter for antineoplastic immunotherapy: Secondary | ICD-10-CM | POA: Diagnosis not present

## 2022-08-31 DIAGNOSIS — C669 Malignant neoplasm of unspecified ureter: Secondary | ICD-10-CM

## 2022-08-31 LAB — COMPREHENSIVE METABOLIC PANEL
ALT: 14 U/L (ref 0–44)
AST: 20 U/L (ref 15–41)
Albumin: 3.4 g/dL — ABNORMAL LOW (ref 3.5–5.0)
Alkaline Phosphatase: 66 U/L (ref 38–126)
Anion gap: 4 — ABNORMAL LOW (ref 5–15)
BUN: 34 mg/dL — ABNORMAL HIGH (ref 8–23)
CO2: 26 mmol/L (ref 22–32)
Calcium: 8 mg/dL — ABNORMAL LOW (ref 8.9–10.3)
Chloride: 107 mmol/L (ref 98–111)
Creatinine, Ser: 2.29 mg/dL — ABNORMAL HIGH (ref 0.61–1.24)
GFR, Estimated: 28 mL/min — ABNORMAL LOW (ref >60)
Glucose, Bld: 106 mg/dL — ABNORMAL HIGH (ref 70–99)
Potassium: 4 mmol/L (ref 3.5–5.1)
Sodium: 137 mmol/L (ref 135–145)
Total Bilirubin: 0.5 mg/dL (ref 0.3–1.2)
Total Protein: 6 g/dL — ABNORMAL LOW (ref 6.5–8.1)

## 2022-08-31 LAB — CBC WITH DIFFERENTIAL/PLATELET
Abs Immature Granulocytes: 0.02 10*3/uL (ref 0.00–0.07)
Basophils Absolute: 0 10*3/uL (ref 0.0–0.1)
Basophils Relative: 0 %
Eosinophils Absolute: 0.8 10*3/uL — ABNORMAL HIGH (ref 0.0–0.5)
Eosinophils Relative: 12 %
HCT: 32.9 % — ABNORMAL LOW (ref 39.0–52.0)
Hemoglobin: 10.8 g/dL — ABNORMAL LOW (ref 13.0–17.0)
Immature Granulocytes: 0 %
Lymphocytes Relative: 6 %
Lymphs Abs: 0.4 10*3/uL — ABNORMAL LOW (ref 0.7–4.0)
MCH: 30.9 pg (ref 26.0–34.0)
MCHC: 32.8 g/dL (ref 30.0–36.0)
MCV: 94 fL (ref 80.0–100.0)
Monocytes Absolute: 0.9 10*3/uL (ref 0.1–1.0)
Monocytes Relative: 13 %
Neutro Abs: 4.8 10*3/uL (ref 1.7–7.7)
Neutrophils Relative %: 69 %
Platelets: 145 10*3/uL — ABNORMAL LOW (ref 150–400)
RBC: 3.5 MIL/uL — ABNORMAL LOW (ref 4.22–5.81)
RDW: 14 % (ref 11.5–15.5)
WBC: 7 10*3/uL (ref 4.0–10.5)
nRBC: 0 % (ref 0.0–0.2)

## 2022-08-31 MED ORDER — ACETAMINOPHEN 325 MG PO TABS
650.0000 mg | ORAL_TABLET | Freq: Once | ORAL | Status: AC
  Administered 2022-08-31: 650 mg via ORAL
  Filled 2022-08-31: qty 2

## 2022-08-31 MED ORDER — DEXAMETHASONE 4 MG PO TABS
20.0000 mg | ORAL_TABLET | Freq: Once | ORAL | Status: AC
  Administered 2022-08-31: 20 mg via ORAL
  Filled 2022-08-31: qty 5

## 2022-08-31 MED ORDER — DIPHENHYDRAMINE HCL 25 MG PO CAPS
50.0000 mg | ORAL_CAPSULE | Freq: Once | ORAL | Status: AC
  Administered 2022-08-31: 50 mg via ORAL
  Filled 2022-08-31: qty 2

## 2022-08-31 MED ORDER — BORTEZOMIB CHEMO SQ INJECTION 3.5 MG (2.5MG/ML)
1.3000 mg/m2 | Freq: Once | INTRAMUSCULAR | Status: AC
  Administered 2022-08-31: 2.25 mg via SUBCUTANEOUS
  Filled 2022-08-31: qty 0.9

## 2022-08-31 MED ORDER — DARATUMUMAB-HYALURONIDASE-FIHJ 1800-30000 MG-UT/15ML ~~LOC~~ SOLN
1800.0000 mg | Freq: Once | SUBCUTANEOUS | Status: AC
  Administered 2022-08-31: 1800 mg via SUBCUTANEOUS
  Filled 2022-08-31: qty 15

## 2022-08-31 NOTE — Assessment & Plan Note (Addendum)
Stable hemoglobin. monitor 

## 2022-08-31 NOTE — Progress Notes (Signed)
Hematology/Oncology Progress note [phone] [phone]      Patient Care Team: Baxter Hire, MD as PCP - General (Internal Medicine) Noreene Filbert, MD as Radiation Oncologist (Radiation Oncology) Baxter Hire, MD (Internal Medicine) Lavonia Dana, MD as Consulting Physician (Nephrology) [name], MD as Consulting Physician (Oncology)  ASSESSMENT & PLAN:   Cancer Staging  Multiple myeloma [facility]) Staging form: Plasma Cell Myeloma and Plasma Cell Disorders, AJCC 8th Edition - Clinical stage from 03/17/2022: RISS Stage III (Beta-2-microglobulin (mg/L): 6.9, Albumin (g/dL): 3.3, ISS: Stage III, High-risk cytogenetics: Present, LDH: Normal) - Signed by [name], MD on 03/17/2022  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 proceed with Daratumumab- resume Revlimid 96m D1-21 and Velcade-  Resume aspirin 81 mg daily for DVT prophylaxis. Continue Acyclovir 2055mBID  -Bone health, currently he does not have an active myeloma bone involvement.  We discussed about future recommendation of bisphosphonate or denosumab treatments.  Patient reports history of developing adverse reaction after Prolia treatments. -Currently he also has ongoing dental issue need root canal.  Hold off bisphosphonate.  Anemia in stage 4 chronic kidney disease (HCC) Stable hemoglobin. monitor   Stage 4 chronic  kidney disease (HCC) Avoid nephrotoxins.  Encourage oral hydration.   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  He will need distal ureterectomy.  [facility].  +/- [facility] as well as bladder high-grade papillary carcinoma. [facility]  [facility].  S/p palliative radiation. 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 Urine culture showed no growth.  Recommend patient to hold Aspirin 8161mor 2-3 days if recurrent hematuria.  Continue Aspirin 37m7mily if no hematuria  No orders of the defined types were placed in this encounter.  Follow-up 1 week lab velcade 2 weeks lab MD Dara velvade All questions were answered. The patient knows to call the clinic with any problems, questions or concerns.  ZhouEarlie Server, PhD [facility] 08/31/2022    CHIEF COMPLAINTS/REASON FOR VISIT:  Multiple myeloma, high-grade urothelial carcinoma.  HISTORY OF PRESENTING ILLNESS:  [name] [name] is a 81 y87. male who was seen in consultation at the request of JohnBaxter Hire presents for follow-up of multiple myeloma and high-grade urothelial carcinoma management.    Patient has progressively worsening kidney function.  He is scheduled for kidney biopsy. 01/26/2022, free kappa light chain 1058, lambda 15.3, light chain ratio 69. Protein electrophoresis showed restricted band-M spike migrating in the beta-1 globulin region. ANA negative.  ANCA negative. Random urine protein electrophoresis showed abnormal protein band detected in the gamma globulin And a second possible abnormal  protein band  that may represent monoclonal protein.  Patient denies any back pain, unintentional weight loss, night sweats or fever. He lives at home with wife.  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   CMP showed creatinine of 3.41, that is significantly worse than his baseline in June 2022. 02/28/2022, 24-hour urine protein electrophoresis showed M protein of 729 mg, IgA monoclonal kappa light chain. 03/01/2022, [facility] showed diffuse light chain tubulopathy, and light chain cast nephropathy, moderate interstitial fibrosis,4% global glomerular sclerosis and the marked atherosclerosis.  03/02/2022 PET scan showed no definitive signs of multiple myeloma by FDG PET. Obstructive right ureter with soft tissue mass in the right pelvis showing increased metabolic activity suspicious for urothelial neoplasm.  Little or no excretion of FDG on the RIGHT but still with uptake of FDG by renal parenchyma. Degree of hydronephrosis is not substantially changed but lack of FDG excretion on the RIGHT is compatible with secondary sign of physiologic significance of ureteral obstruction. RIGHT pelvic sidewall uptake without visible lesion, could represent tiny lymph node not visible on noncontrast imaging. If the patient is no longer and acute renal failure could consider MRI with and without contrast for further assessment of pelvic findings. Ultimately cystoscopy may be helpful for further assessment, aortic atherosclerosis, pulmonary emphysema.   03/06/2022, patient underwent a bone marrow biopsy. Pathology showed hypercellular bone marrow with plasma cell neoplasm, representing 20% of all cells in the aspirate associated with prominent interstitial infiltrates and numerous predominantly  small clusters in the clots in the biopsy sections.  The plasma cells display kappa light chain restriction consistent with plasma cell neoplasm. Normal cytogenetics, myeloma FISH panel positive for 13q deletion, gain of 1q, t (4;14)  Patient received 20 mg dexamethasone x1 after bone marrow biopsy.  03/14/2022, cystoscopy showed diffuse narrowing right distal ureter with hydroureter following up the distal ureter and extending proximally.  Right ureteroscopy showed nodular tumor distal ureter and a convincing area without tumor suspicious for extrinsic obstruction.s/p   right ureteral stent placement. Ureteral tumor showed a tiny fragments of fibrous tissue, interpretation limited by thermal and crush artifact.  Right distal ureter saline barbotage showed hight grade urothelial carcinoma.   INTERVAL HISTORY Yovani Cogburn Gilreath is a 81 y.o. male who has above history reviewed by me today presents for follow up visit for multiple myeloma and high-grade urothelial carcinoma.  Finished palliative Radiation to Bladder/Pelvis.  + Hematuria, stopped Aspirin 93m for a few days and hematuria resolved. He resumed Aspirin 850mdaily.  + urinary urgency, no dysuria, fever chills.   Review of Systems  Constitutional:  Positive for fatigue. Negative for appetite change, chills, fever and unexpected weight change.  HENT:   Negative for hearing loss and voice change.   Eyes:  Negative for eye problems and icterus.  Respiratory:  Negative for chest tightness, cough and shortness of breath.   Cardiovascular:  Negative for chest pain and leg swelling.  Gastrointestinal:  Negative for abdominal distention, abdominal pain and diarrhea.  Endocrine: Negative for hot flashes.  Genitourinary:  Positive for frequency. Negative for difficulty urinating and dysuria.   Musculoskeletal:  Negative for arthralgias.  Skin:  Negative for itching and rash.  Neurological:  Negative for light-headedness and numbness.  Hematological:   Negative for adenopathy. Does not bruise/bleed easily.  Psychiatric/Behavioral:  Negative for confusion.      MEDICAL HISTORY:  Past Medical History:  Diagnosis Date   Age related osteoporosis    Anemia    Barrett's esophagus    Bradycardia    Cancer (HCC)    PROSTATE   Cataract    CKD (chronic kidney disease) stage 3, GFR 30-59 ml/min (HCC)    Colon polyps    DDD (degenerative disc disease), cervical    DDD (degenerative disc disease), lumbar    Femur fracture (HCC)    Right   Fundic gland polyposis of stomach    Fundic gland polyps of stomach, benign    Gastritis    GERD (gastroesophageal reflux disease)    Hematuria    Hemorrhage of rectum and anus    History of colon polyps    Insomnia    Lumbago    Osteopenia of the elderly    Skin cancer    Sleep apnea     SURGICAL HISTORY: Past Surgical History:  Procedure Laterality Date   BAND HEMORRHOIDECTOMY     COLONOSCOPY     COLONOSCOPY WITH PROPOFOL N/A 05/06/2018   Procedure: COLONOSCOPY WITH PROPOFOL;  Surgeon: Lollie Sails, MD;  Location: [facility] ENDOSCOPY;  Service: Endoscopy;  Laterality: N/A;   COLONOSCOPY WITH PROPOFOL N/A 09/23/2018   Procedure: COLONOSCOPY WITH PROPOFOL;  Surgeon: Lollie Sails, MD;  [facility] ENDOSCOPY;  Service: Endoscopy;  Laterality: N/A;   COLONOSCOPY WITH PROPOFOL N/A 03/30/2021   Procedure: COLONOSCOPY WITH PROPOFOL;  Surgeon: Toledo, Benay Pike, MD;  Location: [facility] ENDOSCOPY;  Service: Gastroenterology;  Laterality: N/A;   CYSTOSCOPY W/ URETERAL STENT PLACEMENT Right 03/14/2022   Procedure: CYSTOSCOPY WITH RETROGRADE PYELOGRAM/URETERAL STENT PLACEMENT;  Surgeon: Abbie Sons, MD;  Location: [facility] ORS;  Service: Urology;  Laterality: Right;   ESOPHAGOGASTRODUODENOSCOPY (EGD) WITH PROPOFOL N/A 10/03/2016   Procedure: ESOPHAGOGASTRODUODENOSCOPY (EGD) WITH PROPOFOL;  Surgeon: Lollie Sails, MD;  Location[facility] ENDOSCOPY;  Service: Endoscopy;  Laterality: N/A;   EYE  SURGERY     CATARACTS   EYELID SURGERY     FLEXIBLE SIGMOIDOSCOPY     FRACTURE SURGERY Right 2016   femur   FRACTURE SURGERY     ORIF DISTAL FEMUR FRACTURE     TONSILLECTOMY     TRANSURETHRAL RESECTION OF BLADDER TUMOR N/A 03/14/2022   Procedure: TRANSURETHRAL RESECTION OF BLADDER TUMOR (TURBT);  Surgeon: Abbie Sons, MD;  Location: [facility] ORS;  Service: Urology;  Laterality: N/A;    SOCIAL HISTORY: Social History   Socioeconomic History   Marital status: Married    Spouse name: Not on file   Number of children: Not on file   Years of education: Not on file   Highest education level: Not on file  Occupational History   Not on file  Tobacco Use   Smoking status: Former    Packs/day: 1.00    Years: 15.00    Total pack years: 15.00    Types: Cigarettes    Quit date: 10/03/1974    Years since quitting: 47.9   Smokeless tobacco: Never  Vaping Use   Vaping Use: Never used  Substance and Sexual Activity   Alcohol use: Not Currently    Alcohol/week: 1.0 standard drink of alcohol    Types: 1 Cans of beer per week    Comment: 1 beer every 2 weeks   Drug use: No   Sexual activity: Yes    Birth control/protection: None  Other Topics Concern   Not on file  Social History Narrative   ** Merged History Encounter **       Social Determinants of Health   Financial Resource Strain: Not on file  Food Insecurity: Not on file  Transportation Needs: Not on file  Physical Activity: Not on file  Stress: Not on file  Social Connections: Not on file  Intimate Partner Violence: Not on file    FAMILY HISTORY: Family History  Problem Relation Age of Onset   Breast cancer Mother     ALLERGIES:  is allergic to demerol [meperidine hcl], prolia [denosumab], demerol [meperidine], and nsaids.  MEDICATIONS:  Current Outpatient Medications  Medication Sig Dispense Refill   acyclovir (ZOVIRAX) 200 MG capsule Take 1 capsule (200 mg total) by mouth 2 (two) times daily. 60 capsule 3    amoxicillin (AMOXIL) 500 MG capsule Take 500 mg by mouth every 8 (eight) hours.     Calcium Carbonate (CALCIUM 500 PO) Take 1 tablet by mouth daily.     Cholecalciferol (VITAMIN D3) 125 MCG (5000 UT) CAPS Take 5,000 Units by mouth daily.     ciprofloxacin (CIPRO) 250 MG tablet Take 1 tablet (250 mg total) by mouth 2 (two) times daily. 10 tablet 0   ferrous sulfate 325 (65 FE) MG tablet Take 325 mg by mouth daily.     ipratropium (ATROVENT) 0.03 % nasal spray Place 1 spray into both nostrils in the morning.     lenalidomide (REVLIMID) 5 MG capsule Take 1 capsule (5 mg total) by mouth See admin instructions. TAKE 1 CAPSULE BY MOUTH ONCE DAILY FOR 21 DAYS ON AND 7 DAYS OFF 21 capsule 0   losartan (COZAAR) 25 MG tablet Take 25 mg by mouth daily.     melatonin 5 MG TABS Take 5 mg by mouth at bedtime.     montelukast (SINGULAIR) 10 MG tablet Take 1 tablet (10 mg total) by mouth See admin instructions. Take 1 tablet the day prior to Daratumumab treatments, and take 1 tablet daily for 2 days after treatments 30 tablet 1   Multiple Vitamins-Minerals (MENS 50+ MULTIVITAMIN) TABS Take 1 tablet by mouth daily.     oxybutynin (DITROPAN XL) 10 MG 24 hr tablet Take 1 tablet (10 mg total) by mouth at bedtime. 30 tablet 1   senna (SENOKOT) 8.6 MG TABS tablet Take 2 tablets (17.2 mg total) by mouth daily. 120 tablet 0   traZODone (DESYREL) 50 MG tablet Take 25 mg by mouth at bedtime.     aspirin EC 81 MG tablet Take 1 tablet (81 mg total) by mouth daily. Swallow whole. (Patient not taking: Reported on 08/22/2022) 30 tablet 11   No current facility-administered medications for this visit.   Facility-Administered Medications Ordered in Other Visits  Medication Dose Route Frequency Provider Last Rate Last Admin   acetaminophen (TYLENOL) tablet 650 mg  650 mg Oral Once [name], MD       bortezomib SQ (VELCADE) chemo injection (2.83m/mL concentration) 2.25 mg  1.3 mg/m2 (Treatment Plan Recorded) Subcutaneous Once  YEarlie Server MD       daratumumab-hyaluronidase-fihj ([facility]) 1800-30000 MG-UT/15ML chemo SQ injection 1,800 mg  1,800 mg Subcutaneous Once YEarlie Server MD       dexamethasone (DECADRON) tablet 20 mg  20 mg Oral Once YEarlie Server MD       diphenhydrAMINE (BENADRYL) capsule 50 mg  50 mg Oral Once YEarlie Server MD         PHYSICAL EXAMINATION: ECOG PERFORMANCE STATUS: 1 - Symptomatic but completely  ambulatory Today's Vitals   08/31/22 1256  BP: (!) 123/46  Pulse: (!) 50  Resp: 20  Temp: 97.8 F (36.6 C)  SpO2: 100%  Weight: 143 lb 9.6 oz (65.1 kg)   Body mass index is 22.49 kg/m.   Physical Exam Constitutional:      General: He is not in acute distress. HENT:     Head: Normocephalic and atraumatic.  Eyes:     General: No scleral icterus. Cardiovascular:     Rate and Rhythm: Normal rate and regular rhythm.     Heart sounds: Normal heart sounds.  Pulmonary:     Effort: Pulmonary effort is normal. No respiratory distress.     Breath sounds: No wheezing.  Abdominal:     General: Bowel sounds are normal. There is no distension.     Palpations: Abdomen is soft.  Musculoskeletal:        General: No deformity. Normal range of motion.     Cervical back: Normal range of motion and neck supple.  Skin:    General: Skin is warm and dry.     Findings: No erythema or rash.  Neurological:     Mental Status: He is alert and oriented to person, place, and time. Mental status is at baseline.     Cranial Nerves: No cranial nerve deficit.     Coordination: Coordination normal.  Psychiatric:        Mood and Affect: Mood normal.      LABORATORY DATA:  I have reviewed the data as listed    Latest Ref Rng & Units 08/31/2022   12:39 PM 08/24/2022   12:39 PM 08/22/2022    9:17 AM  CBC  WBC 4.0 - 10.5 K/uL 7.0  4.7  4.7   Hemoglobin 13.0 - 17.0 g/dL 10.8  11.4  11.3   Hematocrit 39.0 - 52.0 % 32.9  34.0  34.5   Platelets 150 - 400 K/uL 145  168  158        Latest Ref Rng & Units  08/31/2022   12:39 PM 08/24/2022   12:39 PM 08/15/2022    9:36 AM  CMP  Glucose 70 - 99 mg/dL 106  123  103   BUN 8 - 23 mg/dL 34  29  42   Creatinine 0.61 - 1.24 mg/dL 2.29  2.46  2.43   Sodium 135 - 145 mmol/L 137  139  140   Potassium 3.5 - 5.1 mmol/L 4.0  3.8  4.3   Chloride 98 - 111 mmol/L 107  107  109   CO2 22 - 32 mmol/L _0 Calcium 8.9 - 10.3 mg/dL 8.0  8.1  9.5   Total Protein 6.5 - 8.1 g/dL 6.0  6.0  6.0   Total Bilirubin 0.3 - 1.2 mg/dL 0.5  0.7  0.5   Alkaline Phos 38 - 126 U/L 66  69  68   AST 15 - 41 U/L _1 ALT 0 - 44 U/L _2 Iron/TIBC/Ferritin/ %Sat    Component Value Date/Time   IRON 60 02/23/2022 1523   TIBC 238 (L) 02/23/2022 1523   FERRITIN 86 02/23/2022 1523   IRONPCTSAT 25 02/23/2022 1523       RADIOGRAPHIC STUDIES: I have personally reviewed the radiological images as listed and agreed with the findings in the report.  US Venous Img Lower Bilateral  Result Date: 06/28/2022 CLINICAL DATA:  Bilateral lower extremity pain and edema for the past 3 days. History of multiple myeloma and bladder cancer. Evaluate for DVT. EXAM: BILATERAL LOWER EXTREMITY VENOUS DOPPLER ULTRASOUND TECHNIQUE: Gray-scale sonography with graded compression, as well as color Doppler and duplex ultrasound were performed to evaluate the lower extremity deep venous systems from the level of the common femoral vein and including the common femoral, femoral, profunda femoral, popliteal and calf veins including the posterior tibial, peroneal and gastrocnemius veins when visible. The superficial great saphenous vein was also interrogated. Spectral Doppler was utilized to evaluate flow at rest and with distal augmentation maneuvers in the common femoral, femoral and popliteal veins. COMPARISON:  None Available. FINDINGS: RIGHT LOWER EXTREMITY Common Femoral Vein: No evidence of thrombus. Normal compressibility, respiratory phasicity and response to augmentation.  Saphenofemoral Junction: No evidence of thrombus. Normal compressibility and flow on color Doppler imaging. Profunda Femoral Vein: No evidence of thrombus. Normal compressibility and flow on color Doppler imaging. Femoral Vein: No evidence of thrombus. Normal compressibility, respiratory phasicity and response to augmentation. Popliteal Vein: No evidence of thrombus. Normal compressibility, respiratory phasicity and response to augmentation. Calf Veins: No evidence of thrombus. Normal compressibility and flow on color Doppler imaging. Superficial Great Saphenous Vein: No evidence of thrombus. Normal compressibility. Other Findings:  None. LEFT LOWER EXTREMITY Common Femoral Vein: No evidence of thrombus. Normal compressibility, respiratory phasicity and response to augmentation. Saphenofemoral Junction: No evidence of thrombus. Normal compressibility and flow on color Doppler imaging. Profunda Femoral Vein: No evidence of thrombus. Normal compressibility and flow on color Doppler imaging. Femoral Vein: No evidence of thrombus. Normal compressibility, respiratory phasicity and response to augmentation. Popliteal Vein: No evidence of thrombus. Normal compressibility, respiratory phasicity and response to augmentation. Calf Veins: No evidence of thrombus. Normal compressibility and flow on color Doppler imaging. Superficial Great Saphenous Vein: No evidence of thrombus. Normal compressibility. Other Findings:  None. IMPRESSION: No evidence of DVT within either lower extremity. Electronically Signed   By: Sandi Mariscal M.D.   On: 06/28/2022 09:40   DG Chest 2 View  Result Date: 06/22/2022 CLINICAL DATA:  Cough. EXAM: CHEST - 2 VIEW COMPARISON:  May 15, 2022. FINDINGS: The heart size and mediastinal contours are within normal limits. Both lungs are clear. The visualized skeletal structures are unremarkable. IMPRESSION: No active cardiopulmonary disease. Electronically Signed   By: Marijo Conception M.D.   On: 06/22/2022  16:45

## 2022-08-31 NOTE — Assessment & Plan Note (Signed)
Chemotherapy plan as listed above 

## 2022-08-31 NOTE — Assessment & Plan Note (Addendum)
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 proceed with Daratumumab- resume Revlimid 7m D1-21 and Velcade-  Resume aspirin 81 mg daily for DVT prophylaxis. Continue Acyclovir 2075mBID  -Bone health, currently he does not have an active myeloma bone involvement.  We discussed about future recommendation of bisphosphonate or denosumab treatments.  Patient reports history of developing adverse reaction after Prolia treatments. -Currently he also has ongoing dental issue need root canal.  Hold off bisphosphonate.

## 2022-08-31 NOTE — Assessment & Plan Note (Signed)
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  He will need distal ureterectomy.  Pending [facility] urology oncology evaluation.  +/- Chemotherapy/RT cystoscopy at [facility] showed ureter high-grade carcinoma as well as bladder high-grade papillary carcinoma. He has another PET at [facility]. Findings were reviewed.  His case was discussed on [facility] Urology-oncology tumor board.  S/p palliative radiation. He has follow up visit with [facility] Oncology and will repeat PET there

## 2022-08-31 NOTE — Assessment & Plan Note (Addendum)
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 Urine culture showed no growth.  Recommend patient to hold Aspirin '81mg'$  for 2-3 days if recurrent hematuria.  Continue Aspirin '81mg'$  daily if no hematuria

## 2022-08-31 NOTE — Assessment & Plan Note (Signed)
Avoid nephrotoxins.  Encourage oral hydration  

## 2022-09-01 LAB — KAPPA/LAMBDA LIGHT CHAINS
Kappa free light chain: 86.4 mg/L — ABNORMAL HIGH (ref 3.3–19.4)
Kappa, lambda light chain ratio: 6.45 — ABNORMAL HIGH (ref 0.26–1.65)
Lambda free light chains: 13.4 mg/L (ref 5.7–26.3)

## 2022-09-04 ENCOUNTER — Encounter: Payer: Self-pay | Admitting: Oncology

## 2022-09-04 ENCOUNTER — Inpatient Hospital Stay: Payer: Medicare Other | Attending: Oncology | Admitting: Hospice and Palliative Medicine

## 2022-09-04 DIAGNOSIS — D631 Anemia in chronic kidney disease: Secondary | ICD-10-CM | POA: Insufficient documentation

## 2022-09-04 DIAGNOSIS — R220 Localized swelling, mass and lump, head: Secondary | ICD-10-CM | POA: Insufficient documentation

## 2022-09-04 DIAGNOSIS — I700 Atherosclerosis of aorta: Secondary | ICD-10-CM | POA: Insufficient documentation

## 2022-09-04 DIAGNOSIS — C661 Malignant neoplasm of right ureter: Secondary | ICD-10-CM | POA: Insufficient documentation

## 2022-09-04 DIAGNOSIS — G4730 Sleep apnea, unspecified: Secondary | ICD-10-CM | POA: Insufficient documentation

## 2022-09-04 DIAGNOSIS — N1330 Unspecified hydronephrosis: Secondary | ICD-10-CM | POA: Insufficient documentation

## 2022-09-04 DIAGNOSIS — C674 Malignant neoplasm of posterior wall of bladder: Secondary | ICD-10-CM | POA: Insufficient documentation

## 2022-09-04 DIAGNOSIS — Z79624 Long term (current) use of inhibitors of nucleotide synthesis: Secondary | ICD-10-CM | POA: Insufficient documentation

## 2022-09-04 DIAGNOSIS — N184 Chronic kidney disease, stage 4 (severe): Secondary | ICD-10-CM | POA: Insufficient documentation

## 2022-09-04 DIAGNOSIS — K219 Gastro-esophageal reflux disease without esophagitis: Secondary | ICD-10-CM | POA: Insufficient documentation

## 2022-09-04 DIAGNOSIS — M8580 Other specified disorders of bone density and structure, unspecified site: Secondary | ICD-10-CM | POA: Insufficient documentation

## 2022-09-04 DIAGNOSIS — Z85828 Personal history of other malignant neoplasm of skin: Secondary | ICD-10-CM | POA: Insufficient documentation

## 2022-09-04 DIAGNOSIS — C9000 Multiple myeloma not having achieved remission: Secondary | ICD-10-CM

## 2022-09-04 DIAGNOSIS — Z87891 Personal history of nicotine dependence: Secondary | ICD-10-CM | POA: Insufficient documentation

## 2022-09-04 DIAGNOSIS — Z7961 Long term (current) use of immunomodulator: Secondary | ICD-10-CM | POA: Insufficient documentation

## 2022-09-04 DIAGNOSIS — R21 Rash and other nonspecific skin eruption: Secondary | ICD-10-CM | POA: Insufficient documentation

## 2022-09-04 DIAGNOSIS — Z8546 Personal history of malignant neoplasm of prostate: Secondary | ICD-10-CM | POA: Insufficient documentation

## 2022-09-04 DIAGNOSIS — Z79899 Other long term (current) drug therapy: Secondary | ICD-10-CM | POA: Insufficient documentation

## 2022-09-04 DIAGNOSIS — K2270 Barrett's esophagus without dysplasia: Secondary | ICD-10-CM | POA: Insufficient documentation

## 2022-09-04 DIAGNOSIS — M5136 Other intervertebral disc degeneration, lumbar region: Secondary | ICD-10-CM | POA: Insufficient documentation

## 2022-09-04 DIAGNOSIS — C61 Malignant neoplasm of prostate: Secondary | ICD-10-CM | POA: Insufficient documentation

## 2022-09-04 DIAGNOSIS — C787 Secondary malignant neoplasm of liver and intrahepatic bile duct: Secondary | ICD-10-CM | POA: Insufficient documentation

## 2022-09-04 DIAGNOSIS — C679 Malignant neoplasm of bladder, unspecified: Secondary | ICD-10-CM | POA: Insufficient documentation

## 2022-09-04 DIAGNOSIS — Z5112 Encounter for antineoplastic immunotherapy: Secondary | ICD-10-CM | POA: Insufficient documentation

## 2022-09-04 DIAGNOSIS — J439 Emphysema, unspecified: Secondary | ICD-10-CM | POA: Insufficient documentation

## 2022-09-04 DIAGNOSIS — M5030 Other cervical disc degeneration, unspecified cervical region: Secondary | ICD-10-CM | POA: Insufficient documentation

## 2022-09-04 DIAGNOSIS — Z923 Personal history of irradiation: Secondary | ICD-10-CM | POA: Insufficient documentation

## 2022-09-04 NOTE — Addendum Note (Signed)
Addended by: [name] on: 09/04/2022 04:14 PM   Modules accepted: Orders

## 2022-09-04 NOTE — Progress Notes (Signed)
Virtual Visit via Telephone Note  I connected with [name] [name] on 09/04/22 at  3:00 PM EDT by telephone and verified that I am speaking with the correct person using two identifiers.  Location: Patient: Home Provider: Clinic   I discussed the limitations, risks, security and privacy concerns of performing an evaluation and management service by telephone and the availability of in person appointments. I also discussed with the patient that there may be a patient responsible charge related to this service. The patient expressed understanding and agreed to proceed.   History of Present Illness: [name] [name] is a 81 y.o. 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  He is status post right ureteral stenting.   Patient has been on treatment with daratumumab and Revlimid and Velcade.    Patient is pending [facility] urology oncology evaluation for urothelial cancer.  He is status post XRT.  Patient has had recent hematuria.   Observations/Objective: Follow-up telephone call.  Patient reports that he is doing reasonably well at present.  He denies any symptomatic complaints or concerns today.  He does state that he rode his bicycle last week and subsequently had some bladder spasms but that has resolved.  He has not noticed any recent hematuria.  Reviewed labs with patient.  Hemoglobin did decline slightly but patient denies any active hematuria at this point.  Assessment and Plan: Multiple myeloma on active treatment -seems to be doing reasonably well with improving light chains.  High-grade urothelial carcinoma -followed by [facility] urology.  Monitor for recurrent hematuria.  Follow labs.  Follow Up Instructions: Patient will have follow-up labs/treatment later this week and then will see Dr. Tasia Catchings the following week   I discussed the assessment and treatment plan with the patient.  The patient was provided an opportunity to ask questions and all were answered. The patient agreed with the plan and demonstrated an understanding of the instructions.   The patient was advised to call back or seek an in-person evaluation if the symptoms worsen or if the condition fails to improve as anticipated.  I provided 10 minutes of non-face-to-face time during this encounter.   [name], NP

## 2022-09-06 ENCOUNTER — Ambulatory Visit: Payer: Medicare Other

## 2022-09-06 ENCOUNTER — Other Ambulatory Visit: Payer: Medicare Other

## 2022-09-07 ENCOUNTER — Inpatient Hospital Stay: Payer: Medicare Other

## 2022-09-07 ENCOUNTER — Other Ambulatory Visit: Payer: Self-pay | Admitting: Oncology

## 2022-09-07 VITALS — BP 120/46 | HR 62 | Temp 96.9°F | Resp 18 | Wt 143.4 lb

## 2022-09-07 DIAGNOSIS — Z5112 Encounter for antineoplastic immunotherapy: Secondary | ICD-10-CM | POA: Diagnosis present

## 2022-09-07 DIAGNOSIS — Z79899 Other long term (current) drug therapy: Secondary | ICD-10-CM | POA: Diagnosis not present

## 2022-09-07 DIAGNOSIS — M8580 Other specified disorders of bone density and structure, unspecified site: Secondary | ICD-10-CM | POA: Diagnosis not present

## 2022-09-07 DIAGNOSIS — M5030 Other cervical disc degeneration, unspecified cervical region: Secondary | ICD-10-CM | POA: Diagnosis not present

## 2022-09-07 DIAGNOSIS — M5136 Other intervertebral disc degeneration, lumbar region: Secondary | ICD-10-CM | POA: Diagnosis not present

## 2022-09-07 DIAGNOSIS — C661 Malignant neoplasm of right ureter: Secondary | ICD-10-CM | POA: Diagnosis not present

## 2022-09-07 DIAGNOSIS — Z7961 Long term (current) use of immunomodulator: Secondary | ICD-10-CM | POA: Diagnosis not present

## 2022-09-07 DIAGNOSIS — I700 Atherosclerosis of aorta: Secondary | ICD-10-CM | POA: Diagnosis not present

## 2022-09-07 DIAGNOSIS — G4730 Sleep apnea, unspecified: Secondary | ICD-10-CM | POA: Diagnosis not present

## 2022-09-07 DIAGNOSIS — K2270 Barrett's esophagus without dysplasia: Secondary | ICD-10-CM | POA: Diagnosis not present

## 2022-09-07 DIAGNOSIS — C787 Secondary malignant neoplasm of liver and intrahepatic bile duct: Secondary | ICD-10-CM | POA: Diagnosis not present

## 2022-09-07 DIAGNOSIS — C9000 Multiple myeloma not having achieved remission: Secondary | ICD-10-CM

## 2022-09-07 DIAGNOSIS — J439 Emphysema, unspecified: Secondary | ICD-10-CM | POA: Diagnosis not present

## 2022-09-07 DIAGNOSIS — D631 Anemia in chronic kidney disease: Secondary | ICD-10-CM | POA: Diagnosis not present

## 2022-09-07 DIAGNOSIS — K219 Gastro-esophageal reflux disease without esophagitis: Secondary | ICD-10-CM | POA: Diagnosis not present

## 2022-09-07 DIAGNOSIS — Z87891 Personal history of nicotine dependence: Secondary | ICD-10-CM | POA: Diagnosis not present

## 2022-09-07 DIAGNOSIS — R220 Localized swelling, mass and lump, head: Secondary | ICD-10-CM | POA: Diagnosis not present

## 2022-09-07 DIAGNOSIS — R21 Rash and other nonspecific skin eruption: Secondary | ICD-10-CM | POA: Diagnosis not present

## 2022-09-07 DIAGNOSIS — Z8546 Personal history of malignant neoplasm of prostate: Secondary | ICD-10-CM | POA: Diagnosis not present

## 2022-09-07 DIAGNOSIS — C679 Malignant neoplasm of bladder, unspecified: Secondary | ICD-10-CM | POA: Diagnosis not present

## 2022-09-07 DIAGNOSIS — Z79624 Long term (current) use of inhibitors of nucleotide synthesis: Secondary | ICD-10-CM | POA: Diagnosis not present

## 2022-09-07 DIAGNOSIS — N184 Chronic kidney disease, stage 4 (severe): Secondary | ICD-10-CM | POA: Diagnosis not present

## 2022-09-07 DIAGNOSIS — Z923 Personal history of irradiation: Secondary | ICD-10-CM | POA: Diagnosis not present

## 2022-09-07 DIAGNOSIS — Z85828 Personal history of other malignant neoplasm of skin: Secondary | ICD-10-CM | POA: Diagnosis not present

## 2022-09-07 LAB — COMPREHENSIVE METABOLIC PANEL
ALT: 19 U/L (ref 0–44)
AST: 23 U/L (ref 15–41)
Albumin: 3.3 g/dL — ABNORMAL LOW (ref 3.5–5.0)
Alkaline Phosphatase: 56 U/L (ref 38–126)
Anion gap: 3 — ABNORMAL LOW (ref 5–15)
BUN: 35 mg/dL — ABNORMAL HIGH (ref 8–23)
CO2: 24 mmol/L (ref 22–32)
Calcium: 7.8 mg/dL — ABNORMAL LOW (ref 8.9–10.3)
Chloride: 111 mmol/L (ref 98–111)
Creatinine, Ser: 2.21 mg/dL — ABNORMAL HIGH (ref 0.61–1.24)
GFR, Estimated: 29 mL/min — ABNORMAL LOW (ref >60)
Glucose, Bld: 107 mg/dL — ABNORMAL HIGH (ref 70–99)
Potassium: 3.9 mmol/L (ref 3.5–5.1)
Sodium: 138 mmol/L (ref 135–145)
Total Bilirubin: 0.4 mg/dL (ref 0.3–1.2)
Total Protein: 5.5 g/dL — ABNORMAL LOW (ref 6.5–8.1)

## 2022-09-07 LAB — CBC WITH DIFFERENTIAL/PLATELET
Abs Immature Granulocytes: 0 10*3/uL (ref 0.00–0.07)
Basophils Absolute: 0 10*3/uL (ref 0.0–0.1)
Basophils Relative: 1 %
Eosinophils Absolute: 1.1 10*3/uL — ABNORMAL HIGH (ref 0.0–0.5)
Eosinophils Relative: 27 %
HCT: 29.6 % — ABNORMAL LOW (ref 39.0–52.0)
Hemoglobin: 9.8 g/dL — ABNORMAL LOW (ref 13.0–17.0)
Immature Granulocytes: 0 %
Lymphocytes Relative: 11 %
Lymphs Abs: 0.4 10*3/uL — ABNORMAL LOW (ref 0.7–4.0)
MCH: 31.4 pg (ref 26.0–34.0)
MCHC: 33.1 g/dL (ref 30.0–36.0)
MCV: 94.9 fL (ref 80.0–100.0)
Monocytes Absolute: 0.5 10*3/uL (ref 0.1–1.0)
Monocytes Relative: 13 %
Neutro Abs: 1.9 10*3/uL (ref 1.7–7.7)
Neutrophils Relative %: 48 %
Platelets: 154 10*3/uL (ref 150–400)
RBC: 3.12 MIL/uL — ABNORMAL LOW (ref 4.22–5.81)
RDW: 14.4 % (ref 11.5–15.5)
WBC: 4 10*3/uL (ref 4.0–10.5)
nRBC: 0 % (ref 0.0–0.2)

## 2022-09-07 MED ORDER — BORTEZOMIB CHEMO SQ INJECTION 3.5 MG (2.5MG/ML)
1.3000 mg/m2 | Freq: Once | INTRAMUSCULAR | Status: AC
  Administered 2022-09-07: 2.25 mg via SUBCUTANEOUS
  Filled 2022-09-07: qty 0.9

## 2022-09-07 MED ORDER — DEXAMETHASONE 4 MG PO TABS
20.0000 mg | ORAL_TABLET | Freq: Once | ORAL | Status: AC
  Administered 2022-09-07: 20 mg via ORAL
  Filled 2022-09-07: qty 5

## 2022-09-07 MED ORDER — PROCHLORPERAZINE MALEATE 10 MG PO TABS
10.0000 mg | ORAL_TABLET | Freq: Four times a day (QID) | ORAL | Status: DC | PRN
  Administered 2022-09-07: 10 mg via ORAL
  Filled 2022-09-07: qty 1

## 2022-09-08 ENCOUNTER — Other Ambulatory Visit: Payer: Self-pay

## 2022-09-10 ENCOUNTER — Other Ambulatory Visit: Payer: Self-pay

## 2022-09-13 ENCOUNTER — Ambulatory Visit: Payer: Medicare Other | Admitting: Oncology

## 2022-09-13 ENCOUNTER — Other Ambulatory Visit: Payer: Self-pay | Admitting: Pharmacist

## 2022-09-13 ENCOUNTER — Ambulatory Visit: Payer: Medicare Other

## 2022-09-13 ENCOUNTER — Other Ambulatory Visit: Payer: Medicare Other

## 2022-09-13 ENCOUNTER — Telehealth: Payer: Self-pay | Admitting: *Deleted

## 2022-09-13 DIAGNOSIS — C9000 Multiple myeloma not having achieved remission: Secondary | ICD-10-CM

## 2022-09-13 MED ORDER — LENALIDOMIDE 5 MG PO CAPS: 5.0000 mg | ORAL_CAPSULE | ORAL | 0 refills | Status: DC

## 2022-09-13 MED ORDER — LENALIDOMIDE 5 MG PO CAPS: 5.0000 mg | ORAL_CAPSULE | Freq: Every day | ORAL | 0 refills | Status: DC

## 2022-09-13 NOTE — Telephone Encounter (Signed)
[facility] called stating that they need directions for frequency before dispensing Revlimid prescription

## 2022-09-13 NOTE — Telephone Encounter (Signed)
Clarification sent to [facility]

## 2022-09-14 ENCOUNTER — Other Ambulatory Visit: Payer: Self-pay

## 2022-09-14 ENCOUNTER — Inpatient Hospital Stay: Payer: Medicare Other

## 2022-09-14 ENCOUNTER — Inpatient Hospital Stay (HOSPITAL_BASED_OUTPATIENT_CLINIC_OR_DEPARTMENT_OTHER): Payer: Medicare Other | Admitting: Oncology

## 2022-09-14 ENCOUNTER — Encounter: Payer: Self-pay | Admitting: Oncology

## 2022-09-14 VITALS — BP 117/41 | HR 42 | Temp 97.7°F | Resp 17

## 2022-09-14 VITALS — BP 119/48 | HR 40 | Temp 97.6°F | Resp 16 | Wt 148.5 lb

## 2022-09-14 DIAGNOSIS — N184 Chronic kidney disease, stage 4 (severe): Secondary | ICD-10-CM | POA: Diagnosis not present

## 2022-09-14 DIAGNOSIS — Z5111 Encounter for antineoplastic chemotherapy: Secondary | ICD-10-CM | POA: Diagnosis not present

## 2022-09-14 DIAGNOSIS — C9000 Multiple myeloma not having achieved remission: Secondary | ICD-10-CM

## 2022-09-14 DIAGNOSIS — C669 Malignant neoplasm of unspecified ureter: Secondary | ICD-10-CM

## 2022-09-14 DIAGNOSIS — Z5112 Encounter for antineoplastic immunotherapy: Secondary | ICD-10-CM | POA: Diagnosis not present

## 2022-09-14 DIAGNOSIS — D631 Anemia in chronic kidney disease: Secondary | ICD-10-CM

## 2022-09-14 LAB — CBC WITH DIFFERENTIAL/PLATELET
Abs Immature Granulocytes: 0.01 10*3/uL (ref 0.00–0.07)
Basophils Absolute: 0.1 10*3/uL (ref 0.0–0.1)
Basophils Relative: 2 %
Eosinophils Absolute: 1.1 10*3/uL — ABNORMAL HIGH (ref 0.0–0.5)
Eosinophils Relative: 21 %
HCT: 31.8 % — ABNORMAL LOW (ref 39.0–52.0)
Hemoglobin: 10.6 g/dL — ABNORMAL LOW (ref 13.0–17.0)
Immature Granulocytes: 0 %
Lymphocytes Relative: 9 %
Lymphs Abs: 0.5 10*3/uL — ABNORMAL LOW (ref 0.7–4.0)
MCH: 31.6 pg (ref 26.0–34.0)
MCHC: 33.3 g/dL (ref 30.0–36.0)
MCV: 94.9 fL (ref 80.0–100.0)
Monocytes Absolute: 0.8 10*3/uL (ref 0.1–1.0)
Monocytes Relative: 16 %
Neutro Abs: 2.6 10*3/uL (ref 1.7–7.7)
Neutrophils Relative %: 52 %
Platelets: 190 10*3/uL (ref 150–400)
RBC: 3.35 MIL/uL — ABNORMAL LOW (ref 4.22–5.81)
RDW: 14.4 % (ref 11.5–15.5)
WBC: 5 10*3/uL (ref 4.0–10.5)
nRBC: 0 % (ref 0.0–0.2)

## 2022-09-14 LAB — COMPREHENSIVE METABOLIC PANEL
ALT: 21 U/L (ref 0–44)
AST: 19 U/L (ref 15–41)
Albumin: 3.3 g/dL — ABNORMAL LOW (ref 3.5–5.0)
Alkaline Phosphatase: 65 U/L (ref 38–126)
Anion gap: 4 — ABNORMAL LOW (ref 5–15)
BUN: 41 mg/dL — ABNORMAL HIGH (ref 8–23)
CO2: 26 mmol/L (ref 22–32)
Calcium: 8.6 mg/dL — ABNORMAL LOW (ref 8.9–10.3)
Chloride: 111 mmol/L (ref 98–111)
Creatinine, Ser: 2.42 mg/dL — ABNORMAL HIGH (ref 0.61–1.24)
GFR, Estimated: 26 mL/min — ABNORMAL LOW (ref >60)
Glucose, Bld: 78 mg/dL (ref 70–99)
Potassium: 4.4 mmol/L (ref 3.5–5.1)
Sodium: 141 mmol/L (ref 135–145)
Total Bilirubin: 0.5 mg/dL (ref 0.3–1.2)
Total Protein: 5.8 g/dL — ABNORMAL LOW (ref 6.5–8.1)

## 2022-09-14 MED ORDER — ACETAMINOPHEN 325 MG PO TABS
650.0000 mg | ORAL_TABLET | Freq: Once | ORAL | Status: AC
  Administered 2022-09-14: 650 mg via ORAL
  Filled 2022-09-14: qty 2

## 2022-09-14 MED ORDER — BORTEZOMIB CHEMO SQ INJECTION 3.5 MG (2.5MG/ML)
1.3000 mg/m2 | Freq: Once | INTRAMUSCULAR | Status: AC
  Administered 2022-09-14: 2.25 mg via SUBCUTANEOUS
  Filled 2022-09-14: qty 0.9

## 2022-09-14 MED ORDER — DIPHENHYDRAMINE HCL 25 MG PO CAPS
50.0000 mg | ORAL_CAPSULE | Freq: Once | ORAL | Status: AC
  Administered 2022-09-14: 50 mg via ORAL
  Filled 2022-09-14: qty 2

## 2022-09-14 MED ORDER — DARATUMUMAB-HYALURONIDASE-FIHJ 1800-30000 MG-UT/15ML ~~LOC~~ SOLN
1800.0000 mg | Freq: Once | SUBCUTANEOUS | Status: AC
  Administered 2022-09-14: 1800 mg via SUBCUTANEOUS
  Filled 2022-09-14: qty 15

## 2022-09-14 MED ORDER — DEXAMETHASONE 4 MG PO TABS
20.0000 mg | ORAL_TABLET | Freq: Once | ORAL | Status: AC
  Administered 2022-09-14: 20 mg via ORAL
  Filled 2022-09-14: qty 5

## 2022-09-14 NOTE — Assessment & Plan Note (Signed)
Avoid nephrotoxins.  Encourage oral hydration  

## 2022-09-14 NOTE — Assessment & Plan Note (Signed)
Chemotherapy plan as listed above 

## 2022-09-14 NOTE — Assessment & Plan Note (Addendum)
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 proceed with Daratumumab- resume Revlimid 74m D1-21 and Velcade-  Resume aspirin 81 mg daily for DVT prophylaxis. Continue Acyclovir 2022mBID  -Bone health, currently he does not have an active myeloma bone involvement.  Patient reports history of developing adverse reaction [vision loss] after Prolia treatments. -s/p  root canal.  Hold off bone strengthening agents.

## 2022-09-14 NOTE — Progress Notes (Signed)
Pt's wife states that pt has been having vision issues on rt eye with bluriness for the past 3-4 days. Pt has had cataract surgery around 2003-2004 before and has an implant lenses in both eyes.

## 2022-09-14 NOTE — Progress Notes (Signed)
Hematology/Oncology Progress note [phone] [phone]      Patient Care Team: Baxter Hire, MD as PCP - General (Internal Medicine) Noreene Filbert, MD as Radiation Oncologist (Radiation Oncology) Baxter Hire, MD (Internal Medicine) Lavonia Dana, MD as Consulting Physician (Nephrology) [name], MD as Consulting Physician (Oncology)  ASSESSMENT & PLAN:   Cancer Staging  Multiple myeloma [facility]) Staging form: Plasma Cell Myeloma and Plasma Cell Disorders, AJCC 8th Edition - Clinical stage from 03/17/2022: RISS Stage III (Beta-2-microglobulin (mg/L): 6.9, Albumin (g/dL): 3.3, ISS: Stage III, High-risk cytogenetics: Present, LDH: Normal) - Signed by [name], MD on 03/17/2022  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 proceed with Daratumumab- resume Revlimid 86m D1-21 and Velcade-  Resume aspirin 81 mg daily for DVT prophylaxis. Continue Acyclovir 2068mBID  -Bone health, currently he does not have an active myeloma bone involvement.  Patient reports history of developing adverse reaction [vision loss] after Prolia treatments. -s/p  root canal.  Hold off bone strengthening agents.   Encounter for antineoplastic chemotherapy Chemotherapy plan as listed above  Anemia in stage 4 chronic kidney disease (HCC) Stable hemoglobin. monitor   Stage 4 chronic kidney disease  (HCC) Avoid nephrotoxins.  Encourage oral hydration.   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  He will need distal ureterectomy.  [facility]  [facility] as well as bladder high-grade papillary carcinoma. [facility].  S/p palliative radiation. [facility]   Orders Placed This Encounter  Procedures   Urinalysis, Complete w Microscopic    Standing Status:   Future    Standing Expiration Date:   10/13/2023   Kappa/lambda light chains    Standing Status:   Future    Number of Occurrences:   1    Standing Expiration Date:   09/14/2023    Follow-up 1 week lab velcade 2 weeks lab MD Dara velvade All questions were answered. The patient knows to call the clinic with any problems, questions or concerns.  ZhEarlie ServerMD, PhD [facility] 09/14/2022    CHIEF COMPLAINTS/REASON FOR VISIT:  Multiple myeloma, high-grade urothelial carcinoma.  HISTORY OF PRESENTING ILLNESS:  [name] Glomskiage is a [age].o. male who was seen in consultation at the request of YuEarlie ServerMD presents for follow-up of multiple myeloma and high-grade urothelial carcinoma management.    Patient has progressively worsening kidney function.  He is scheduled for kidney biopsy. 01/26/2022, free kappa light chain 1058, lambda 15.3, light chain ratio 69. Protein electrophoresis showed restricted band-M spike migrating in the beta-1 globulin region. ANA negative.  ANCA negative. Random urine protein electrophoresis showed abnormal protein band detected in the gamma globulin And a second possible abnormal protein band that may represent monoclonal protein.  Patient  denies any back pain, unintentional weight loss, night sweats or fever. He lives  at home with wife.  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   CMP showed creatinine of 3.41, that is significantly worse than his baseline in June 2022. 02/28/2022, 24-hour urine protein electrophoresis showed M protein of 729 mg, IgA monoclonal kappa light chain. 03/01/2022, [facility] kidney biopsy showed diffuse light chain tubulopathy, and light chain cast nephropathy, moderate interstitial fibrosis,4% global glomerular sclerosis and the marked atherosclerosis.  03/02/2022 PET scan showed no definitive signs of multiple myeloma by FDG PET. Obstructive right ureter with soft tissue mass in the right pelvis showing increased metabolic activity suspicious for urothelial neoplasm.  Little or no excretion of FDG on the RIGHT but still with uptake of FDG by renal parenchyma. Degree of hydronephrosis is not substantially changed but lack of FDG excretion on the RIGHT is compatible with secondary sign of physiologic significance of ureteral obstruction. RIGHT pelvic sidewall uptake without visible lesion, could represent tiny lymph node not visible on noncontrast imaging. If the patient is no longer and acute renal failure could consider MRI with and without contrast for further assessment of pelvic findings. Ultimately cystoscopy may be helpful for further assessment, aortic atherosclerosis, pulmonary emphysema.   03/06/2022, patient underwent a bone marrow biopsy. Pathology showed hypercellular bone marrow with plasma cell neoplasm, representing 20% of all cells in the aspirate associated with prominent interstitial infiltrates and numerous predominantly small clusters in the clots in the biopsy sections.  The plasma cells display kappa light chain restriction consistent with plasma cell  neoplasm. Normal cytogenetics, myeloma FISH panel positive for 13q deletion, gain of 1q, t (4;14)  Patient received 20 mg dexamethasone x1 after bone marrow biopsy.  03/14/2022, cystoscopy showed diffuse narrowing right distal ureter with hydroureter following up the distal ureter and extending proximally.  Right ureteroscopy showed nodular tumor distal ureter and a convincing area without tumor suspicious for extrinsic obstruction.s/p   right ureteral stent placement. Ureteral tumor showed a tiny fragments of fibrous tissue, interpretation limited by thermal and crush artifact.  Right distal ureter saline barbotage showed hight grade urothelial carcinoma.   Finished palliative Radiation to Bladder/Pelvis.   INTERVAL HISTORY [name] [name] is a 81 y.o. male who has above history reviewed by me today presents for follow up visit for multiple myeloma and high-grade urothelial carcinoma. No further hematuria episodes. On Aspirin 63m daily.  Urinary urgency has improved, sometimes get worse after bicycling.  [facility]  Denies numbness and tingling.    Review of Systems  Constitutional:  Positive for fatigue. Negative for appetite change, chills, fever and unexpected weight change.  HENT:   Negative for hearing loss and voice change.   Eyes:  Negative for eye problems and icterus.  Respiratory:  Negative for chest tightness, cough and shortness of breath.   Cardiovascular:  Negative for chest pain and leg swelling.  Gastrointestinal:  Negative for abdominal distention, abdominal pain and diarrhea.  Endocrine: Negative for hot flashes.  Genitourinary:  Positive for frequency. Negative for difficulty urinating and dysuria.   Musculoskeletal:  Negative for arthralgias.  Skin:  Negative for itching and rash.  Neurological:  Negative for light-headedness and numbness.  Hematological:  Negative for adenopathy. Does not bruise/bleed easily.  Psychiatric/Behavioral:  Negative for  confusion.       MEDICAL HISTORY:  Past Medical History:  Diagnosis Date   Age related osteoporosis    Anemia    Barrett's esophagus    Bradycardia    Cancer (HCC)    PROSTATE   Cataract    CKD (chronic kidney disease) stage 3, GFR 30-59 ml/min (HCC)    Colon polyps    DDD (degenerative disc disease), cervical    DDD (degenerative disc disease), lumbar    Femur fracture (HCC)    Right   Fundic gland polyposis of stomach    Fundic gland polyps of stomach, benign    Gastritis    GERD (gastroesophageal reflux disease)    Hematuria    Hemorrhage of rectum and anus    History of colon polyps    Insomnia    Lumbago    Osteopenia of the elderly    Skin cancer    Sleep apnea     SURGICAL HISTORY: Past Surgical History:  Procedure Laterality Date   BAND HEMORRHOIDECTOMY     COLONOSCOPY     COLONOSCOPY WITH PROPOFOL N/A 05/06/2018   Procedure: COLONOSCOPY WITH PROPOFOL;  Surgeon: Lollie Sails, MD;  Location[facility] END[facility]COPY;  Service: Endoscopy;  Laterality: N/A;   COLONOSCOPY WITH PROPOFOL N/A 09/23/2018   Procedure: COLONOSCOPY WITH PROPOFOL;  Surgeon: Lollie Sails, MD;  Location: [facility] ENDOSCOPY;  Service: Endoscopy;  Laterality: N/A;   COLONOSCOPY WITH PROPOFOL N/A 03/30/2021   Procedure: COLONOSCOPY WITH PROPOFOL;  Surgeon: Toledo, Benay Pike, MD;  Location: [facility] ENDOSCOPY;  Service: Gastroenterology;  Laterality: N/A;   CYSTOSCOPY W/ URETERAL STENT PLACEMENT Right 03/14/2022   Procedure: CYSTOSCOPY WITH RETROGRADE PYELOGRAM/URETERAL STENT PLACEMENT;  Surgeon: Abbie Sons, MD;  Location: [facility] ORS;  Service: Urology;  Laterality: Right;   ESOPHAGOGASTRODUODENOSCOPY (EGD) WITH PROPOFOL N/A 10/03/2016   Procedure: ESOPHAGOGASTRODUODENOSCOPY (EGD) WITH PROPOFOL;  Surgeon: Lollie Sails, MD;  Location[facility] ENDOSCOPY;  Service: Endoscopy;  Laterality: N/A;   EYE SURGERY     CATARACTS   EYELID SURGERY     FLEXIBLE SIGMOIDOSCOPY     FRACTURE SURGERY Right 2016   femur    FRACTURE SURGERY     ORIF DISTAL FEMUR FRACTURE     TONSILLECTOMY     TRANSURETHRAL RESECTION OF BLADDER TUMOR N/A 03/14/2022   Procedure: TRANSURETHRAL RESECTION OF BLADDER TUMOR (TURBT);  Surgeon: Abbie Sons, MD;  Location: [facility] ORS;  Service: Urology;  Laterality: N/A;    SOCIAL HISTORY: Social History   Socioeconomic History   Marital status: Married    Spouse name: Not on file   Number of children: Not on file   Years of education: Not on file   Highest education level: Not on file  Occupational History   Not on file  Tobacco Use   Smoking status: Former    Packs/day: 1.00    Years: 15.00    Total pack years: 15.00    Types: Cigarettes    Quit date: 10/03/1974    Years since quitting: 47.9   Smokeless tobacco: Never  Vaping Use   Vaping Use: Never used  Substance and Sexual Activity   Alcohol use: Not Currently    Alcohol/week: 1.0 standard drink of alcohol    Types: 1 Cans of beer per week    Comment: 1 beer every 2 weeks   Drug use: No   Sexual activity: Yes    Birth control/protection: None  Other Topics Concern   Not on file  Social  History Narrative   ** Merged History Encounter **       Social Determinants of Health   Financial Resource Strain: Not on file  Food Insecurity: Not on file  Transportation Needs: Not on file  Physical Activity: Not on file  Stress: Not on file  Social Connections: Not on file  Intimate Partner Violence: Not on file    FAMILY HISTORY: Family History  Problem Relation Age of Onset   Breast cancer Mother     ALLERGIES:  is allergic to demerol [meperidine hcl], prolia [denosumab], demerol [meperidine], and nsaids.  MEDICATIONS:  Current Outpatient Medications  Medication Sig Dispense Refill   acyclovir (ZOVIRAX) 200 MG capsule Take 1 capsule (200 mg total) by mouth 2 (two) times daily. 60 capsule 3   amoxicillin (AMOXIL) 500 MG capsule Take 500 mg by mouth every 8 (eight) hours.     aspirin EC 81 MG tablet  Take 1 tablet (81 mg total) by mouth daily. Swallow whole. 30 tablet 11   Calcium Carbonate (CALCIUM 500 PO) Take 1 tablet by mouth daily.     Cholecalciferol (VITAMIN D3) 125 MCG (5000 UT) CAPS Take 5,000 Units by mouth daily.     ferrous sulfate 325 (65 FE) MG tablet Take 325 mg by mouth daily.     ipratropium (ATROVENT) 0.03 % nasal spray Place 1 spray into both nostrils in the morning.     lenalidomide (REVLIMID) 5 MG capsule Take 1 capsule (5 mg total) by mouth daily. Take for 21 days, then hold for 7 days. Repeat every 28 days. 21 capsule 0   losartan (COZAAR) 25 MG tablet Take 25 mg by mouth daily.     melatonin 5 MG TABS Take 5 mg by mouth at bedtime.     montelukast (SINGULAIR) 10 MG tablet Take 1 tablet (10 mg total) by mouth See admin instructions. Take 1 tablet the day prior to Daratumumab treatments, and take 1 tablet daily for 2 days after treatments 30 tablet 1   Multiple Vitamins-Minerals (MENS 50+ MULTIVITAMIN) TABS Take 1 tablet by mouth daily.     oxybutynin (DITROPAN XL) 10 MG 24 hr tablet Take 1 tablet (10 mg total) by mouth at bedtime. 30 tablet 1   senna (SENOKOT) 8.6 MG TABS tablet Take 2 tablets (17.2 mg total) by mouth daily. 120 tablet 0   traZODone (DESYREL) 50 MG tablet Take 25 mg by mouth at bedtime.     No current facility-administered medications for this visit.   Facility-Administered Medications Ordered in Other Visits  Medication Dose Route Frequency Provider Last Rate Last Admin   bortezomib SQ (VELCADE) chemo injection (2.37m/mL concentration) 2.25 mg  1.3 mg/m2 (Treatment Plan Recorded) Subcutaneous Once YEarlie Server MD       daratumumab-hyaluronidase-fihj ([facility]) 1800-30000 MG-UT/15ML chemo SQ injection 1,800 mg  1,800 mg Subcutaneous Once YEarlie Server MD         PHYSICAL EXAMINATION: ECOG PERFORMANCE STATUS: 1 - Symptomatic but completely ambulatory Today's Vitals   09/14/22 0903  BP: (!) 119/48  Pulse: (!) 40  Resp: 16  Temp: 97.6 F (36.4  C)  SpO2: 100%  Weight: 148 lb 8 oz (67.4 kg)  PainSc: 0-No pain   Body mass index is 23.26 kg/m.   Physical Exam Constitutional:      General: He is not in acute distress. HENT:     Head: Normocephalic and atraumatic.  Eyes:     General: No scleral icterus. Cardiovascular:     Rate and  Rhythm: Normal rate and regular rhythm.  Pulmonary:     Effort: Pulmonary effort is normal. No respiratory distress.     Breath sounds: No wheezing.  Abdominal:     General: There is no distension.     Palpations: Abdomen is soft.  Musculoskeletal:        General: No deformity. Normal range of motion.     Cervical back: Normal range of motion and neck supple.  Skin:    General: Skin is warm and dry.     Findings: No erythema or rash.  Neurological:     Mental Status: He is alert and oriented to person, place, and time. Mental status is at baseline.     Cranial Nerves: No cranial nerve deficit.     Coordination: Coordination normal.  Psychiatric:        Mood and Affect: Mood normal.      LABORATORY DATA:  I have reviewed the data as listed    Latest Ref Rng & Units 09/14/2022    8:49 AM 09/07/2022   12:41 PM 08/31/2022   12:39 PM  CBC  WBC 4.0 - 10.5 K/uL 5.0  4.0  7.0   Hemoglobin 13.0 - 17.0 g/dL 10.6  9.8  10.8   Hematocrit 39.0 - 52.0 % 31.8  29.6  32.9   Platelets 150 - 400 K/uL 190  154  145        Latest Ref Rng & Units 09/14/2022    8:49 AM 09/07/2022   12:41 PM 08/31/2022   12:39 PM  CMP  Glucose 70 - 99 mg/dL 78  107  106   BUN 8 - 23 mg/dL 41  35  34   Creatinine 0.61 - 1.24 mg/dL 2.42  2.21  2.29   Sodium 135 - 145 mmol/L 141  138  137   Potassium 3.5 - 5.1 mmol/L 4.4  3.9  4.0   Chloride 98 - 111 mmol/L 111  111  107   CO2 22 - 32 mmol/L '26  24  26   ' Calcium 8.9 - 10.3 mg/dL 8.6  7.8  8.0   Total Protein 6.5 - 8.1 g/dL 5.8  5.5  6.0   Total Bilirubin 0.3 - 1.2 mg/dL 0.5  0.4  0.5   Alkaline Phos 38 - 126 U/L 65  56  66   AST 15 - 41 U/L '19  23  20   ' ALT  0 - 44 U/L '21  19  14     ' Iron/TIBC/Ferritin/ %Sat    Component Value Date/Time   IRON 60 02/23/2022 1523   TIBC 238 (L) 02/23/2022 1523   FERRITIN 86 02/23/2022 1523   IRONPCTSAT 25 02/23/2022 1523       RADIOGRAPHIC STUDIES: I have personally reviewed the radiological images as listed and agreed with the findings in the report.  US Venous Img Lower Bilateral  Result Date: 06/28/2022 CLINICAL DATA:  Bilateral lower extremity pain and edema for the past 3 days. History of multiple myeloma and bladder cancer. Evaluate for DVT. EXAM: BILATERAL LOWER EXTREMITY VENOUS DOPPLER ULTRASOUND TECHNIQUE: Gray-scale sonography with graded compression, as well as color Doppler and duplex ultrasound were performed to evaluate the lower extremity deep venous systems from the level of the common femoral vein and including the common femoral, femoral, profunda femoral, popliteal and calf veins including the posterior tibial, peroneal and gastrocnemius veins when visible. The superficial great saphenous vein was also interrogated. Spectral Doppler was utilized to evaluate flow at  rest and with distal augmentation maneuvers in the common femoral, femoral and popliteal veins. COMPARISON:  None Available. FINDINGS: RIGHT LOWER EXTREMITY Common Femoral Vein: No evidence of thrombus. Normal compressibility, respiratory phasicity and response to augmentation. Saphenofemoral Junction: No evidence of thrombus. Normal compressibility and flow on color Doppler imaging. Profunda Femoral Vein: No evidence of thrombus. Normal compressibility and flow on color Doppler imaging. Femoral Vein: No evidence of thrombus. Normal compressibility, respiratory phasicity and response to augmentation. Popliteal Vein: No evidence of thrombus. Normal compressibility, respiratory phasicity and response to augmentation. Calf Veins: No evidence of thrombus. Normal compressibility and flow on color Doppler imaging. Superficial Great Saphenous  Vein: No evidence of thrombus. Normal compressibility. Other Findings:  None. LEFT LOWER EXTREMITY Common Femoral Vein: No evidence of thrombus. Normal compressibility, respiratory phasicity and response to augmentation. Saphenofemoral Junction: No evidence of thrombus. Normal compressibility and flow on color Doppler imaging. Profunda Femoral Vein: No evidence of thrombus. Normal compressibility and flow on color Doppler imaging. Femoral Vein: No evidence of thrombus. Normal compressibility, respiratory phasicity and response to augmentation. Popliteal Vein: No evidence of thrombus. Normal compressibility, respiratory phasicity and response to augmentation. Calf Veins: No evidence of thrombus. Normal compressibility and flow on color Doppler imaging. Superficial Great Saphenous Vein: No evidence of thrombus. Normal compressibility. Other Findings:  None. IMPRESSION: No evidence of DVT within either lower extremity. Electronically Signed   By: Sandi Mariscal M.D.   On: 06/28/2022 09:40   DG Chest 2 View  Result Date: 06/22/2022 CLINICAL DATA:  Cough. EXAM: CHEST - 2 VIEW COMPARISON:  May 15, 2022. FINDINGS: The heart size and mediastinal contours are within normal limits. Both lungs are clear. The visualized skeletal structures are unremarkable. IMPRESSION: No active cardiopulmonary disease. Electronically Signed   By: Marijo Conception M.D.   On: 06/22/2022 16:45

## 2022-09-14 NOTE — Assessment & Plan Note (Signed)
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  He will need distal ureterectomy.  Pending [facility] urology oncology evaluation.  +/- Chemotherapy/RT cystoscopy at [facility] showed ureter high-grade carcinoma as well as bladder high-grade papillary carcinoma. His case was discussed on [facility] Urology-oncology tumor board.  S/p palliative radiation. He has follow up visit with [facility] Oncology and will repeat PET there

## 2022-09-14 NOTE — Assessment & Plan Note (Signed)
Stable hemoglobin. monitor 

## 2022-09-14 NOTE — Patient Instructions (Signed)
[facility] CANCER CTR AT [place]  Discharge Instructions: Thank you for choosing [facility] to provide your oncology and hematology care.  If you have a lab appointment with the [facility], please go directly to the [facility] and check in at the registration area.  Wear comfortable clothing and clothing appropriate for easy access to any Portacath or PICC line.   We strive to give you quality time with your provider. You may need to reschedule your appointment if you arrive late (15 or more minutes).  Arriving late affects you and other patients whose appointments are after yours.  Also, if you miss three or more appointments without notifying the office, you may be dismissed from the clinic at the provider's discretion.      For prescription refill requests, have your pharmacy contact our office and allow 72 hours for refills to be completed.    Today you received the following chemotherapy and/or immunotherapy agents Darzalex Faspro and Velcade      To help prevent nausea and vomiting after your treatment, we encourage you to take your nausea medication as directed.  BELOW ARE SYMPTOMS THAT SHOULD BE REPORTED IMMEDIATELY: *FEVER GREATER THAN 100.4 F (38 C) OR HIGHER *CHILLS OR SWEATING *NAUSEA AND VOMITING THAT IS NOT CONTROLLED WITH YOUR NAUSEA MEDICATION *UNUSUAL SHORTNESS OF BREATH *UNUSUAL BRUISING OR BLEEDING *URINARY PROBLEMS (pain or burning when urinating, or frequent urination) *BOWEL PROBLEMS (unusual diarrhea, constipation, pain near the anus) TENDERNESS IN MOUTH AND THROAT WITH OR WITHOUT PRESENCE OF ULCERS (sore throat, sores in mouth, or a toothache) UNUSUAL RASH, SWELLING OR PAIN  UNUSUAL VAGINAL DISCHARGE OR ITCHING   Items with * indicate a potential emergency and should be followed up as soon as possible or go to the Emergency Department if any problems should occur.  Please show the CHEMOTHERAPY ALERT CARD or IMMUNOTHERAPY ALERT  CARD at check-in to the Emergency Department and triage nurse.  Should you have questions after your visit or need to cancel or reschedule your appointment, please contact [facility] CANCER [place] AT [place]  [phone] and follow the prompts.  Office hours are 8:00 a.m. to 4:30 p.m. Monday - Friday. Please note that voicemails left after 4:00 p.m. may not be returned until the following business day.  We are closed weekends and major holidays. You have access to a nurse at all times for urgent questions. Please call the main number to the clinic [phone] and follow the prompts.  For any non-urgent questions, you may also contact your provider using MyChart. We now offer e-Visits for anyone 66 and older to request care online for non-urgent symptoms. For details visit [email or web address].   Also download the MyChart app! Go to the app store, search "MyChart", open the app, select [facility], and log in with your MyChart username and password.  Masks are optional in the cancer centers. If you would like for your care team to wear a mask while they are taking care of you, please let them know. For doctor visits, patients may have with them one support person who is at least 83 years old. At this time, visitors are not allowed in the infusion area.

## 2022-09-15 ENCOUNTER — Other Ambulatory Visit: Payer: Self-pay | Admitting: *Deleted

## 2022-09-15 LAB — KAPPA/LAMBDA LIGHT CHAINS
Kappa free light chain: 97.5 mg/L — ABNORMAL HIGH (ref 3.3–19.4)
Kappa, lambda light chain ratio: 9.47 — ABNORMAL HIGH (ref 0.26–1.65)
Lambda free light chains: 10.3 mg/L (ref 5.7–26.3)

## 2022-09-19 ENCOUNTER — Other Ambulatory Visit: Payer: Self-pay | Admitting: Oncology

## 2022-09-19 ENCOUNTER — Telehealth: Payer: Self-pay | Admitting: *Deleted

## 2022-09-19 DIAGNOSIS — M5432 Sciatica, left side: Secondary | ICD-10-CM

## 2022-09-19 LAB — MULTIPLE MYELOMA PANEL, SERUM
Albumin SerPl Elph-Mcnc: 3.2 g/dL (ref 2.9–4.4)
Albumin/Glob SerPl: 1.7 (ref 0.7–1.7)
Alpha 1: 0.2 g/dL (ref 0.0–0.4)
Alpha2 Glob SerPl Elph-Mcnc: 0.6 g/dL (ref 0.4–1.0)
B-Globulin SerPl Elph-Mcnc: 0.8 g/dL (ref 0.7–1.3)
Gamma Glob SerPl Elph-Mcnc: 0.4 g/dL (ref 0.4–1.8)
Globulin, Total: 2 g/dL — ABNORMAL LOW (ref 2.2–3.9)
IgA: 289 mg/dL (ref 61–437)
IgG (Immunoglobin G), Serum: 432 mg/dL — ABNORMAL LOW (ref 603–1613)
IgM (Immunoglobulin M), Srm: 42 mg/dL (ref 15–143)
M Protein SerPl Elph-Mcnc: 0.1 g/dL — ABNORMAL HIGH
Total Protein ELP: 5.2 g/dL — ABNORMAL LOW (ref 6.0–8.5)

## 2022-09-19 MED ORDER — GABAPENTIN 100 MG PO CAPS: 100.0000 mg | ORAL_CAPSULE | Freq: Two times a day (BID) | ORAL | 0 refills | Status: DC

## 2022-09-19 NOTE — Telephone Encounter (Signed)
Patient called reporting that for the past 2 nights he is having severe sciatica on the left side with pain from his low left back to his left knee with the pain concentrating and more intense at his knee. It is keeping him awake at night. He has tried Tylenol for pain without much relief, states he cannot take NSAID's due to renal disease. He has tried Ice and heat both at different times which is not helping (he is putting this on his knee where the pain is worse). He has not lifted anything heavy or bent or twisted to cause any injury, he states it just started suddenly. He does admit that he has some pain during day, but it intensifies when he lies down and there is no position that eases the pain whilst lying down. He is asking for something for pain so that he is able to sleep at night. I told him that we also need to find the cause for his pain and he agreed. He also states that since restarting his Revlimid and the infusion that his scalp is itching really bad mostly at night. He has tried tea tree oil shampoo and conditioner to moisten area but it still itches. It is only his scalp and he said he cannot see a rash as his hair is too thick to see scalp.  Please advise

## 2022-09-19 NOTE — Telephone Encounter (Signed)
  Per [name] [name] - patient states that he had a PET scan at [facility] yesterday. Impression 1. Evidence of disease progression with multifocal new hypermetabolic lesions around the periphery of the liver as described above. The evaluation of these lesions is limited by significant misregistration between the PET and CT portions of the examination. Contrast-enhanced CT or MRI could be used for confirmation and/or guidance of biopsy as necessary. There is also a lung parenchymal nodule with new associated FDG uptake, also suspicious for a site of metastasis. 2. Although the right nephroureteral stent appears to be well positioned, the right kidney demonstrates slightly increased hydronephrosis with significant surrounding fat stranding and relatively delayed clearance of FDG. These findings could suggest stent malfunction or be related to upper urinary tract infection. Suggest clinical correlation with any patient signs or symptoms such as right flank pain, fever, and/or urinalysis findings. 3. Significant response to therapy in previously noted pelvic mass, which is smaller and less FDG-avid than on prior study.   I spoke with Dr. Tasia Catchings. she would like to proceed with an MRI and then followup with Dr. Tasia Catchings to discuss the findings.

## 2022-09-19 NOTE — Telephone Encounter (Signed)
Patient informed of order for MRI, and prescription for Gabapentin, Asked if it would help with the scalp itching too as she had ordered it a while back for something similar. He will try the gabapentin I went over directions of starting with 1 at hs for 3 days then to take twice a day starting on fourth day. He verbalized understanding and will await a call from scheduling for MRI appt

## 2022-09-19 NOTE — Telephone Encounter (Signed)
Patient will need mri sch.

## 2022-09-19 NOTE — Telephone Encounter (Signed)
Per Dr Tasia Catchings, recommend patient to get MRI lumbar wo contrast. I will send gabapentin for [name] [name] to try, this may help his sciatica

## 2022-09-20 ENCOUNTER — Other Ambulatory Visit: Payer: Medicare Other

## 2022-09-20 ENCOUNTER — Ambulatory Visit: Payer: Medicare Other | Admitting: Radiation Oncology

## 2022-09-20 ENCOUNTER — Ambulatory Visit: Payer: Medicare Other

## 2022-09-21 ENCOUNTER — Encounter: Payer: Self-pay | Admitting: Oncology

## 2022-09-21 ENCOUNTER — Inpatient Hospital Stay (HOSPITAL_BASED_OUTPATIENT_CLINIC_OR_DEPARTMENT_OTHER): Payer: Medicare Other | Admitting: Oncology

## 2022-09-21 ENCOUNTER — Inpatient Hospital Stay: Payer: Medicare Other

## 2022-09-21 ENCOUNTER — Other Ambulatory Visit: Payer: TRICARE For Life (TFL)

## 2022-09-21 ENCOUNTER — Ambulatory Visit
Admission: RE | Admit: 2022-09-21 | Discharge: 2022-09-21 | Disposition: A | Discharge status: Home / Self Care | Payer: Medicare Other | Source: Ambulatory Visit | Attending: Oncology | Admitting: Oncology

## 2022-09-21 VITALS — BP 123/53 | HR 44 | Temp 98.7°F | Resp 20 | Wt 148.5 lb

## 2022-09-21 VITALS — BP 130/50 | HR 50 | Temp 97.7°F | Resp 18

## 2022-09-21 DIAGNOSIS — C669 Malignant neoplasm of unspecified ureter: Secondary | ICD-10-CM | POA: Diagnosis not present

## 2022-09-21 DIAGNOSIS — M5432 Sciatica, left side: Secondary | ICD-10-CM

## 2022-09-21 DIAGNOSIS — R220 Localized swelling, mass and lump, head: Secondary | ICD-10-CM | POA: Diagnosis not present

## 2022-09-21 DIAGNOSIS — R221 Localized swelling, mass and lump, neck: Secondary | ICD-10-CM | POA: Insufficient documentation

## 2022-09-21 DIAGNOSIS — Z5112 Encounter for antineoplastic immunotherapy: Secondary | ICD-10-CM | POA: Diagnosis not present

## 2022-09-21 DIAGNOSIS — Z5111 Encounter for antineoplastic chemotherapy: Secondary | ICD-10-CM

## 2022-09-21 DIAGNOSIS — C9000 Multiple myeloma not having achieved remission: Secondary | ICD-10-CM

## 2022-09-21 DIAGNOSIS — N184 Chronic kidney disease, stage 4 (severe): Secondary | ICD-10-CM

## 2022-09-21 DIAGNOSIS — C61 Malignant neoplasm of prostate: Secondary | ICD-10-CM

## 2022-09-21 DIAGNOSIS — D631 Anemia in chronic kidney disease: Secondary | ICD-10-CM

## 2022-09-21 LAB — CBC WITH DIFFERENTIAL/PLATELET
Abs Immature Granulocytes: 0.02 10*3/uL (ref 0.00–0.07)
Basophils Absolute: 0 10*3/uL (ref 0.0–0.1)
Basophils Relative: 0 %
Eosinophils Absolute: 2.3 10*3/uL — ABNORMAL HIGH (ref 0.0–0.5)
Eosinophils Relative: 34 %
HCT: 33.7 % — ABNORMAL LOW (ref 39.0–52.0)
Hemoglobin: 10.8 g/dL — ABNORMAL LOW (ref 13.0–17.0)
Immature Granulocytes: 0 %
Lymphocytes Relative: 6 %
Lymphs Abs: 0.4 10*3/uL — ABNORMAL LOW (ref 0.7–4.0)
MCH: 31.1 pg (ref 26.0–34.0)
MCHC: 32 g/dL (ref 30.0–36.0)
MCV: 97.1 fL (ref 80.0–100.0)
Monocytes Absolute: 0.4 10*3/uL (ref 0.1–1.0)
Monocytes Relative: 6 %
Neutro Abs: 3.6 10*3/uL (ref 1.7–7.7)
Neutrophils Relative %: 54 %
Platelets: 183 10*3/uL (ref 150–400)
RBC: 3.47 MIL/uL — ABNORMAL LOW (ref 4.22–5.81)
RDW: 14.8 % (ref 11.5–15.5)
WBC: 6.7 10*3/uL (ref 4.0–10.5)
nRBC: 0 % (ref 0.0–0.2)

## 2022-09-21 LAB — COMPREHENSIVE METABOLIC PANEL
ALT: 18 U/L (ref 0–44)
AST: 19 U/L (ref 15–41)
Albumin: 3.2 g/dL — ABNORMAL LOW (ref 3.5–5.0)
Alkaline Phosphatase: 57 U/L (ref 38–126)
Anion gap: 8 (ref 5–15)
BUN: 34 mg/dL — ABNORMAL HIGH (ref 8–23)
CO2: 26 mmol/L (ref 22–32)
Calcium: 8.7 mg/dL — ABNORMAL LOW (ref 8.9–10.3)
Chloride: 108 mmol/L (ref 98–111)
Creatinine, Ser: 2.33 mg/dL — ABNORMAL HIGH (ref 0.61–1.24)
GFR, Estimated: 27 mL/min — ABNORMAL LOW (ref >60)
Glucose, Bld: 118 mg/dL — ABNORMAL HIGH (ref 70–99)
Potassium: 4.9 mmol/L (ref 3.5–5.1)
Sodium: 142 mmol/L (ref 135–145)
Total Bilirubin: 0.4 mg/dL (ref 0.3–1.2)
Total Protein: 5.7 g/dL — ABNORMAL LOW (ref 6.5–8.1)

## 2022-09-21 LAB — PSA: Prostatic Specific Antigen: 3.13 ng/mL (ref 0.00–4.00)

## 2022-09-21 MED ORDER — BORTEZOMIB CHEMO SQ INJECTION 3.5 MG (2.5MG/ML)
1.3000 mg/m2 | Freq: Once | INTRAMUSCULAR | Status: AC
  Administered 2022-09-21: 2.25 mg via SUBCUTANEOUS
  Filled 2022-09-21: qty 0.9

## 2022-09-21 MED ORDER — PROCHLORPERAZINE MALEATE 10 MG PO TABS
10.0000 mg | ORAL_TABLET | Freq: Four times a day (QID) | ORAL | Status: DC | PRN
  Administered 2022-09-21: 10 mg via ORAL
  Filled 2022-09-21: qty 1

## 2022-09-21 NOTE — Assessment & Plan Note (Signed)
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  He will need distal ureterectomy.  Pending [facility] urology oncology evaluation.  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 His case was discussed on [facility] Urology-oncology tumor board.  S/p palliative radiation. Recent PET scan at [facility] showed disease progression, liver metastatic disease.   Discussed about systemic options. He is not cisplatin eligible. Recommend Keytruda + Padcev.

## 2022-09-21 NOTE — Patient Instructions (Signed)
[facility]CER CTR AT [place]COLOGY  Discharge Instructions: Thank you for choosing [facility] to provide your oncology and hematology care.  If you have a lab appointment with the Cancer Center, please go directly to the Cancer Center and check in at the registration area.  Wear comfortable clothing and clothing appropriate for easy access to any Portacath or PICC line.   We strive to give you quality time with your provider. You may need to reschedule your appointment if you arrive late (15 or more minutes).  Arriving late affects you and other patients whose appointments are after yours.  Also, if you miss three or more appointments without notifying the office, you may be dismissed from the clinic at the provider's discretion.      For prescription refill requests, have your pharmacy contact our office and allow 72 hours for refills to be completed.    Today you received the following chemotherapy and/or immunotherapy agents Velcade      To help prevent nausea and vomiting after your treatment, we encourage you to take your nausea medication as directed.  BELOW ARE SYMPTOMS THAT SHOULD BE REPORTED IMMEDIATELY: *FEVER GREATER THAN 100.4 F (38 C) OR HIGHER *CHILLS OR SWEATING *NAUSEA AND VOMITING THAT IS NOT CONTROLLED WITH YOUR NAUSEA MEDICATION *UNUSUAL SHORTNESS OF BREATH *UNUSUAL BRUISING OR BLEEDING *URINARY PROBLEMS (pain or burning when urinating, or frequent urination) *BOWEL PROBLEMS (unusual diarrhea, constipation, pain near the anus) TENDERNESS IN MOUTH AND THROAT WITH OR WITHOUT PRESENCE OF ULCERS (sore throat, sores in mouth, or a toothache) UNUSUAL RASH, SWELLING OR PAIN  UNUSUAL VAGINAL DISCHARGE OR ITCHING   Items with * indicate a potential emergency and should be followed up as soon as possible or go to the Emergency Department if any problems should occur.  Please show the CHEMOTHERAPY ALERT CARD or IMMUNOTHERAPY ALERT CARD at check-in to the  Emergency Department and triage nurse.  Should you have questions after your visit or need to cancel or reschedule your appointment, please contact [facility] CANCER CTR AT [place]ICAL ONCOLOGY  [phone] and follow the prompts.  Office hours are 8:00 a.m. to 4:30 p.m. Monday - Friday. Please note that voicemails left after 4:00 p.m. may not be returned until the following business day.  We are closed weekends and major holidays. You have access to a nurse at all times for urgent questions. Please call the main number to the clinic [phone] and follow the prompts.  For any non-urgent questions, you may also contact your provider using MyChart. We now offer e-Visits for anyone 18 and older to request care online for non-urgent symptoms. For details visit [email or web address].   Also download the MyChart app! Go to the app store, search "MyChart", open the app, select , and log in with your MyChart username and password.  Masks are optional in the cancer centers. If you would like for your care team to wear a mask while they are taking care of you, please let them know. For doctor visits, patients may have with them one support person who is at least 83 years old. At this time, visitors are not allowed in the infusion area.   

## 2022-09-21 NOTE — Assessment & Plan Note (Signed)
Stable hemoglobin. monitor 

## 2022-09-21 NOTE — Assessment & Plan Note (Signed)
Avoid nephrotoxins.  Encourage oral hydration  

## 2022-09-21 NOTE — Assessment & Plan Note (Signed)
Slight edema of right face.  Recommend [place] right upper extremity/IJ evaluation.

## 2022-09-21 NOTE — Assessment & Plan Note (Signed)
Chemotherapy plan as listed above 

## 2022-09-21 NOTE — Progress Notes (Signed)
Pt c/o right sided face and neck swelling that he just noticed today. Only recent change was beginning gabapentin on 10/17. No other concerns at this time. Dr. Tasia Catchings aware.Per Secure chat with Dr. Tasia Catchings, ok to proceed with velcade today and pt to see her in clinic after treatment complete

## 2022-09-21 NOTE — Assessment & Plan Note (Signed)
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 proceed with Daratumumab- resume Revlimid 5mg D1-21 and Velcade-  Resume aspirin 81 mg daily for DVT prophylaxis. Continue Acyclovir 200mg BID  -Bone health, currently he does not have an active myeloma bone involvement.  Patient reports history of developing adverse reaction [vision loss] after Prolia treatments. -s/p  root canal.  Hold off bone strengthening agents.  

## 2022-09-21 NOTE — Progress Notes (Signed)
Hematology/Oncology Progress note [phone] [phone]      Patient Care Team: Baxter Hire, MD as PCP - General (Internal Medicine) Noreene Filbert, MD as Radiation Oncologist (Radiation Oncology) Baxter Hire, MD (Internal Medicine) Lavonia Dana, MD as Consulting Physician (Nephrology) [name], MD as Consulting Physician (Oncology)  ASSESSMENT & PLAN:   Cancer Staging  Multiple myeloma [facility]) Staging form: Plasma Cell Myeloma and Plasma Cell Disorders, AJCC 8th Edition - Clinical stage from 03/17/2022: RISS Stage III (Beta-2-microglobulin (mg/L): 6.9, Albumin (g/dL): 3.3, ISS: Stage III, High-risk cytogenetics: Present, LDH: Normal) - Signed by [name], MD on 03/17/2022  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 proceed with Daratumumab- resume Revlimid 20m D1-21 and Velcade-  Resume aspirin 81 mg daily for DVT prophylaxis. Continue Acyclovir 2075mBID  -Bone health, currently he does not have an active myeloma bone involvement.  Patient reports history of developing adverse reaction [vision loss] after Prolia treatments. -s/p  root canal.  Hold off bone strengthening agents.   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  He will need distal ureterectomy.  [facility]  +/-  [facility] as well as bladder high-grade papillary carcinoma. [facility]  S/p palliative radiation. [facility], liver metastatic disease.   Discussed about systemic options. He is not cisplatin eligible. Recommend Keytruda + Padcev.    Right facial swelling Slight edema of right face.  Recommend [place] upper extremity/IJ evaluation.   Anemia in stage 4 chronic kidney disease (HCC) Stable hemoglobin. monitor   Encounter for antineoplastic chemotherapy Chemotherapy plan as listed above  Stage 4 chronic kidney disease (HCC) Avoid nephrotoxins.  Encourage oral hydration.   Orders Placed This Encounter  Procedures   [place] Img Upper Uni Right    Please assess internal jugular vein.    Standing Status:   Future    Standing Expiration Date:   09/21/2023    Order Specific Question:   Reason for Exam (SYMPTOM  OR DIAGNOSIS REQUIRED)    Answer:   right facial swelling.    Order Specific Question:   Preferred imaging location?    Answer:   [facility]    Follow-up To be determined.   All questions were answered. The patient knows to call the clinic with any problems, questions or concerns.  ZhEarlie ServerMD, PhD [facility] 09/21/2022    CHIEF COMPLAINTS/REASON FOR VISIT:  Multiple myeloma, high-grade urothelial carcinoma.  HISTORY OF PRESENTING ILLNESS:  [name] [name] is a [age].o. male who was seen in consultation at the request of JoBaxter HireMD presents for follow-up of multiple myeloma and high-grade urothelial carcinoma management.    Patient has progressively worsening kidney function.  He is scheduled for kidney biopsy. 01/26/2022, free kappa light chain 1058,  lambda 15.3, light chain ratio 69. Protein electrophoresis showed restricted band-M spike migrating in the beta-1 globulin region. ANA  negative.  ANCA negative. Random urine protein electrophoresis showed abnormal protein band detected in the gamma globulin And a second possible abnormal protein band that may represent monoclonal protein.  Patient denies any back pain, unintentional weight loss, night sweats or fever. He lives at home with wife.  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   CMP showed creatinine of 3.41, that is significantly worse than his baseline in June 2022. 02/28/2022, 24-hour urine protein electrophoresis showed M protein of 729 mg, IgA monoclonal kappa light chain. 03/01/2022, [facility] biopsy showed diffuse light chain tubulopathy, and light chain cast nephropathy, moderate interstitial fibrosis,4% global glomerular sclerosis and the marked atherosclerosis.  03/02/2022 PET scan showed no definitive signs of multiple myeloma by FDG PET. Obstructive right ureter with soft tissue mass in the right pelvis showing increased metabolic activity suspicious for urothelial neoplasm.  Little or no excretion of FDG on the RIGHT but still with uptake of FDG by renal parenchyma. Degree of hydronephrosis is not substantially changed but lack of FDG excretion on the RIGHT is compatible with secondary sign of physiologic significance of ureteral obstruction. RIGHT pelvic sidewall uptake without visible lesion, could represent tiny lymph node not visible on noncontrast imaging. If the patient is no longer and acute renal failure could consider MRI with and without contrast for further assessment of pelvic findings. Ultimately cystoscopy may be helpful for further assessment, aortic atherosclerosis, pulmonary emphysema.   03/06/2022, patient underwent a bone marrow biopsy. Pathology showed hypercellular bone  marrow with plasma cell neoplasm, representing 20% of all cells in the aspirate associated with prominent interstitial infiltrates and numerous predominantly small clusters in the clots in the biopsy sections.  The plasma cells display kappa light chain restriction consistent with plasma cell neoplasm. Normal cytogenetics, myeloma FISH panel positive for 13q deletion, gain of 1q, t (4;14)  Patient received 20 mg dexamethasone x1 after bone marrow biopsy.  03/14/2022, cystoscopy showed diffuse narrowing right distal ureter with hydroureter following up the distal ureter and extending proximally.  Right ureteroscopy showed nodular tumor distal ureter and a convincing area without tumor suspicious for extrinsic obstruction.s/p   right ureteral stent placement. Ureteral tumor showed a tiny fragments of fibrous tissue, interpretation limited by thermal and crush artifact.  Right distal ureter saline barbotage showed hight grade urothelial carcinoma.   Finished palliative Radiation to Bladder/Pelvis.   INTERVAL HISTORY [name] [name] is a 81 y.o. male who has above history reviewed by me today presents for follow up visit for multiple myeloma and high-grade urothelial carcinoma. He presents for acute visit due to right facial swelling. He noticed for a few days.  He takes gabapentin for sciatica pain.    Review of Systems  Constitutional:  Positive for fatigue. Negative for appetite change, chills, fever and unexpected weight change.  HENT:   Negative for hearing loss and voice change.   Eyes:  Negative for eye problems and icterus.  Respiratory:  Negative for chest tightness, cough and shortness of breath.   Cardiovascular:  Negative for chest pain and leg swelling.  Gastrointestinal:  Negative for abdominal distention, abdominal pain and diarrhea.  Endocrine: Negative for hot flashes.  Genitourinary:  Positive for  frequency. Negative for difficulty urinating and dysuria.   Musculoskeletal:  Negative  for arthralgias.  Skin:  Negative for itching and rash.  Neurological:  Negative for light-headedness and numbness.  Hematological:  Negative for adenopathy. Does not bruise/bleed easily.  Psychiatric/Behavioral:  Negative for confusion.      MEDICAL HISTORY:  Past Medical History:  Diagnosis Date   Age related osteoporosis    Anemia    Barrett's esophagus    Bradycardia    Cancer (HCC)    PROSTATE   Cataract    CKD (chronic kidney disease) stage 3, GFR 30-59 ml/min (HCC)    Colon polyps    DDD (degenerative disc disease), cervical    DDD (degenerative disc disease), lumbar    Femur fracture (HCC)    Right   Fundic gland polyposis of stomach    Fundic gland polyps of stomach, benign    Gastritis    GERD (gastroesophageal reflux disease)    Hematuria    Hemorrhage of rectum and anus    History of colon polyps    Insomnia    Lumbago    Osteopenia of the elderly    Skin cancer    Sleep apnea     SURGICAL HISTORY: Past Surgical History:  Procedure Laterality Date   BAND HEMORRHOIDECTOMY     COLONOSCOPY     COLONOSCOPY WITH PROPOFOL N/A 05/06/2018   Procedure: COLONOSCOPY WITH PROPOFOL;  Surgeon: Lollie Sails, MD;  Location: [facility] ENDOSCOPY;  Service: Endoscopy;  Laterality: N/A;   COLONOSCOPY WITH PROPOFOL N/A 09/23/2018   Procedure: COLONOSCOPY WITH PROPOFOL;  Surgeon: Lollie Sails, MD;  Location: [facility] ENDOSCOPY;  Service: Endoscopy;  Laterality: N/A;   COLONOSCOPY WITH PROPOFOL N/A 03/30/2021   Procedure: COLONOSCOPY WITH PROPOFOL;  Surgeon: Toledo, Benay Pike, MD;  Location: [facility] ENDOSCOPY;  Service: Gastroenterology;  Laterality: N/A;   CYSTOSCOPY W/ URETERAL STENT PLACEMENT Right 03/14/2022   Procedure: CYSTOSCOPY WITH RETROGRADE PYELOGRAM/URETERAL STENT PLACEMENT;  Surgeon: Abbie Sons, MD;  Location: [facility] ORS;  Service: Urology;  Laterality: Right;   ESOPHAGOGASTRODUODENOSCOPY (EGD) WITH PROPOFOL N/A 10/03/2016   Procedure:  ESOPHAGOGASTRODUODENOSCOPY (EGD) WITH PROPOFOL;  Surgeon: Lollie Sails, MD;  Location: [facility] ENDOSCOPY;  Service: Endoscopy;  Laterality: N/A;   EYE SURGERY     CATARACTS   EYELID SURGERY     FLEXIBLE SIGMOIDOSCOPY     FRACTURE SURGERY Right 2016   femur   FRACTURE SURGERY     ORIF DISTAL FEMUR FRACTURE     TONSILLECTOMY     TRANSURETHRAL RESECTION OF BLADDER TUMOR N/A 03/14/2022   Procedure: TRANSURETHRAL RESECTION OF BLADDER TUMOR (TURBT);  Surgeon: Abbie Sons, MD;  Location: [facility] ORS;  Service: Urology;  Laterality: N/A;    SOCIAL HISTORY: Social History   Socioeconomic History   Marital status: Married    Spouse name: Not on file   Number of children: Not on file   Years of education: Not on file   Highest education level: Not on file  Occupational History   Not on file  Tobacco Use   Smoking status: Former    Packs/day: 1.00    Years: 15.00    Total pack years: 15.00    Types: Cigarettes    Quit date: 10/03/1974    Years since quitting: 48.0   Smokeless tobacco: Never  Vaping Use   Vaping Use: Never used  Substance and Sexual Activity   Alcohol use: Not Currently    Alcohol/week: 1.0 standard drink of alcohol  Types: 1 Cans of beer per week    Comment: 1 beer every 2 weeks   Drug use: No   Sexual activity: Yes    Birth control/protection: None  Other Topics Concern   Not on file  Social History Narrative   ** Merged History Encounter **       Social Determinants of Health   Financial Resource Strain: Not on file  Food Insecurity: Not on file  Transportation Needs: Not on file  Physical Activity: Not on file  Stress: Not on file  Social Connections: Not on file  Intimate Partner Violence: Not on file    FAMILY HISTORY: Family History  Problem Relation Age of Onset   Breast cancer Mother     ALLERGIES:  is allergic to demerol [meperidine hcl], prolia [denosumab], demerol [meperidine], and nsaids.  MEDICATIONS:  Current Outpatient  Medications  Medication Sig Dispense Refill   acyclovir (ZOVIRAX) 200 MG capsule Take 1 capsule (200 mg total) by mouth 2 (two) times daily. 60 capsule 3   aspirin EC 81 MG tablet Take 1 tablet (81 mg total) by mouth daily. Swallow whole. 30 tablet 11   Calcium Carbonate (CALCIUM 500 PO) Take 1 tablet by mouth daily.     Cholecalciferol (VITAMIN D3) 125 MCG (5000 UT) CAPS Take 5,000 Units by mouth daily.     ferrous sulfate 325 (65 FE) MG tablet Take 325 mg by mouth daily.     gabapentin (NEURONTIN) 100 MG capsule Take 1 capsule (100 mg total) by mouth 2 (two) times daily. Start once daily at bedtime, after 3 days, you may increase to twice daily. Avoid driving after taking medication. 60 capsule 0   ipratropium (ATROVENT) 0.03 % nasal spray Place 1 spray into both nostrils in the morning.     lenalidomide (REVLIMID) 5 MG capsule Take 1 capsule (5 mg total) by mouth daily. Take for 21 days, then hold for 7 days. Repeat every 28 days. 21 capsule 0   losartan (COZAAR) 25 MG tablet Take 25 mg by mouth daily.     melatonin 5 MG TABS Take 5 mg by mouth at bedtime.     montelukast (SINGULAIR) 10 MG tablet Take 1 tablet (10 mg total) by mouth See admin instructions. Take 1 tablet the day prior to Daratumumab treatments, and take 1 tablet daily for 2 days after treatments 30 tablet 1   Multiple Vitamins-Minerals (MENS 50+ MULTIVITAMIN) TABS Take 1 tablet by mouth daily.     oxybutynin (DITROPAN XL) 10 MG 24 hr tablet Take 1 tablet (10 mg total) by mouth at bedtime. 30 tablet 1   senna (SENOKOT) 8.6 MG TABS tablet Take 2 tablets (17.2 mg total) by mouth daily. 120 tablet 0   traZODone (DESYREL) 50 MG tablet Take 25 mg by mouth at bedtime.     amoxicillin (AMOXIL) 500 MG capsule Take 500 mg by mouth every 8 (eight) hours.     No current facility-administered medications for this visit.     PHYSICAL EXAMINATION: ECOG PERFORMANCE STATUS: 1 - Symptomatic but completely ambulatory Today's Vitals    09/21/22 1436  BP: (!) 123/53  Pulse: (!) 44  Resp: 20  Temp: 98.7 F (37.1 C)  SpO2: 100%  Weight: 148 lb 8 oz (67.4 kg)   Body mass index is 23.26 kg/m.   Physical Exam Constitutional:      General: He is not in acute distress. HENT:     Head: Normocephalic and atraumatic.  Eyes:  General: No scleral icterus. Cardiovascular:     Rate and Rhythm: Normal rate and regular rhythm.  Pulmonary:     Effort: Pulmonary effort is normal. No respiratory distress.     Breath sounds: No wheezing.  Abdominal:     General: There is no distension.     Palpations: Abdomen is soft.  Musculoskeletal:        General: No deformity. Normal range of motion.     Cervical back: Normal range of motion and neck supple.  Skin:    General: Skin is warm and dry.     Findings: No erythema or rash.  Neurological:     Mental Status: He is alert and oriented to person, place, and time. Mental status is at baseline.     Cranial Nerves: No cranial nerve deficit.     Coordination: Coordination normal.  Psychiatric:        Mood and Affect: Mood normal.      LABORATORY DATA:  I have reviewed the data as listed    Latest Ref Rng & Units 09/21/2022   12:44 PM 09/14/2022    8:49 AM 09/07/2022   12:41 PM  CBC  WBC 4.0 - 10.5 K/uL 6.7  5.0  4.0   Hemoglobin 13.0 - 17.0 g/dL 10.8  10.6  9.8   Hematocrit 39.0 - 52.0 % 33.7  31.8  29.6   Platelets 150 - 400 K/uL 183  190  154        Latest Ref Rng & Units 09/21/2022   12:44 PM 09/14/2022    8:49 AM 09/07/2022   12:41 PM  CMP  Glucose 70 - 99 mg/dL 118  78  107   BUN 8 - 23 mg/dL 34  41  35   Creatinine 0.61 - 1.24 mg/dL 2.33  2.42  2.21   Sodium 135 - 145 mmol/L 142  141  138   Potassium 3.5 - 5.1 mmol/L 4.9  4.4  3.9   Chloride 98 - 111 mmol/L 108  111  111   CO2 22 - 32 mmol/L '26  26  24   ' Calcium 8.9 - 10.3 mg/dL 8.7  8.6  7.8   Total Protein 6.5 - 8.1 g/dL 5.7  5.8  5.5   Total Bilirubin 0.3 - 1.2 mg/dL 0.4  0.5  0.4   Alkaline Phos  38 - 126 U/L 57  65  56   AST 15 - 41 U/L '19  19  23   ' ALT 0 - 44 U/L '18  21  19     ' Iron/TIBC/Ferritin/ %Sat    Component Value Date/Time   IRON 60 02/23/2022 1523   TIBC 238 (L) 02/23/2022 1523   FERRITIN 86 02/23/2022 1523   IRONPCTSAT 25 02/23/2022 1523       RADIOGRAPHIC STUDIES: I have personally reviewed the radiological images as listed and agreed with the findings in the report.  MR Lumbar Spine Wo Contrast  Result Date: 09/21/2022 CLINICAL DATA:  Low back pain. Multiple myeloma. Increased fracture risk. Pain extends into the left lower extremity. EXAM: MRI LUMBAR SPINE WITHOUT CONTRAST TECHNIQUE: Multiplanar, multisequence MR imaging of the lumbar spine was performed. No intravenous contrast was administered. COMPARISON:  CT of the abdomen and pelvis 02/07/2019. Whole-body bone scan 02/07/2019. FINDINGS: Segmentation: 5 non rib-bearing lumbar type vertebral bodies are present. The lowest fully formed vertebral body is L5. Alignment: No significant listhesis is present. Lumbar lordosis is preserved. Vertebrae: A T2 hyperintense lesion along the inferior  aspect of the S1 vertebral body measures up to 12 mm. No other discrete marrow lesions are present. Vertebral body heights are normal. Conus medullaris and cauda equina: Conus extends to the T12-L1 level. Conus is within normal limits. A 6 mm nodule is present in the cauda equina at the L4-5 level. Nerve roots are otherwise within normal limits. Paraspinal and other soft tissues: Benign cysts of the liver are again noted. A simple cyst in the posterior aspect of the left kidney is stable measuring 27 mm. No other discrete solid organ lesions are present. No significant adenopathy is present. Disc levels: T12-L1: Insert normal disc level L1-2: Insert normal disc level L2-3: Mild disc desiccation is present. No significant herniation or stenosis is present. L3-4: A mild broad-based disc bulge is present. Disc extends into the foramina  bilaterally. Mild bilateral foraminal stenosis is present. Central canal is patent. L4-5: Mild lateral disc bulging is present. Facet hypertrophy and short pedicles contribute to mild bilateral foraminal stenosis. L5-S1: A broad-based disc protrusion is present. Left paramedian annular tear is present near the left S1 nerve roots. Disc material extends into the foramina bilaterally without significant stenosis. IMPRESSION: 1. 6 mm nodule in the cauda equina at the L4-5 level. 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 Recommend MRI of the lumbar spine with contrast for further evaluation. 2. 12 mm T2 hyperintense lesion along the inferior aspect of the S1 vertebral body. This may represent focal involvement of multiple myeloma. No other discrete lesions are present. No pathologic fracture is present. 3. Mild bilateral foraminal stenosis at L3-4 and L4-5. 4. Left paramedian annular tear at L5-S1 near the left S1 nerve roots. Electronically Signed   By: San Morelle M.D.   On: 09/21/2022 10:11   US Venous Img Lower Bilateral  Result Date: 06/28/2022 CLINICAL DATA:  Bilateral lower extremity pain and edema for the past 3 days. History of multiple myeloma and bladder cancer. Evaluate for DVT. EXAM: BILATERAL LOWER EXTREMITY VENOUS DOPPLER ULTRASOUND TECHNIQUE: Gray-scale sonography with graded compression, as well as color Doppler and duplex ultrasound were performed to evaluate the lower extremity deep venous systems from the level of the common femoral vein and including the common femoral, femoral, profunda femoral, popliteal and calf veins including the posterior tibial, peroneal and gastrocnemius veins when visible. The superficial great saphenous vein was also interrogated. Spectral Doppler was utilized to evaluate flow at rest and with distal augmentation maneuvers in the common femoral, femoral and popliteal veins. COMPARISON:  None Available. FINDINGS: RIGHT  LOWER EXTREMITY Common Femoral Vein: No evidence of thrombus. Normal compressibility, respiratory phasicity and response to augmentation. Saphenofemoral Junction: No evidence of thrombus. Normal compressibility and flow on color Doppler imaging. Profunda Femoral Vein: No evidence of thrombus. Normal compressibility and flow on color Doppler imaging. Femoral Vein: No evidence of thrombus. Normal compressibility, respiratory phasicity and response to augmentation. Popliteal Vein: No evidence of thrombus. Normal compressibility, respiratory phasicity and response to augmentation. Calf Veins: No evidence of thrombus. Normal compressibility and flow on color Doppler imaging. Superficial Great Saphenous Vein: No evidence of thrombus. Normal compressibility. Other Findings:  None. LEFT LOWER EXTREMITY Common Femoral Vein: No evidence of thrombus. Normal compressibility, respiratory phasicity and response to augmentation. Saphenofemoral Junction: No evidence of thrombus. Normal compressibility and flow on color Doppler imaging. Profunda Femoral Vein: No evidence of thrombus. Normal compressibility and flow on color Doppler imaging. Femoral Vein: No evidence of thrombus. Normal compressibility, respiratory phasicity and response  to augmentation. Popliteal Vein: No evidence of thrombus. Normal compressibility, respiratory phasicity and response to augmentation. Calf Veins: No evidence of thrombus. Normal compressibility and flow on color Doppler imaging. Superficial Great Saphenous Vein: No evidence of thrombus. Normal compressibility. Other Findings:  None. IMPRESSION: No evidence of DVT within either lower extremity. Electronically Signed   By: Sandi Mariscal M.D.   On: 06/28/2022 09:40

## 2022-09-21 NOTE — Progress Notes (Signed)
Face swelling that started this morning

## 2022-09-22 ENCOUNTER — Ambulatory Visit
Admission: RE | Admit: 2022-09-22 | Discharge: 2022-09-22 | Disposition: A | Discharge status: Home / Self Care | Payer: Medicare Other | Source: Ambulatory Visit | Attending: Oncology | Admitting: Oncology

## 2022-09-22 DIAGNOSIS — R220 Localized swelling, mass and lump, head: Secondary | ICD-10-CM | POA: Diagnosis present

## 2022-09-24 IMAGING — CR DG HIP (WITH OR WITHOUT PELVIS) 2-3V*L*
1 series · 3 of 3 positions shown · non-contrast
Comparison: PET-CT March 02, 2022

CLINICAL DATA: Bilateral hip pain.  History of multiple myeloma.

EXAM:
DG HIP (WITH OR WITHOUT PELVIS) 2-3V LEFT; DG HIP (WITH OR WITHOUT
PELVIS) 2-3V RIGHT

[Series 1: dg hip unilat w or w/o pelvis 2-3 views  · non-contrast · 0.14mm/px · 3 of 3 slices shown]
[im 1/3]
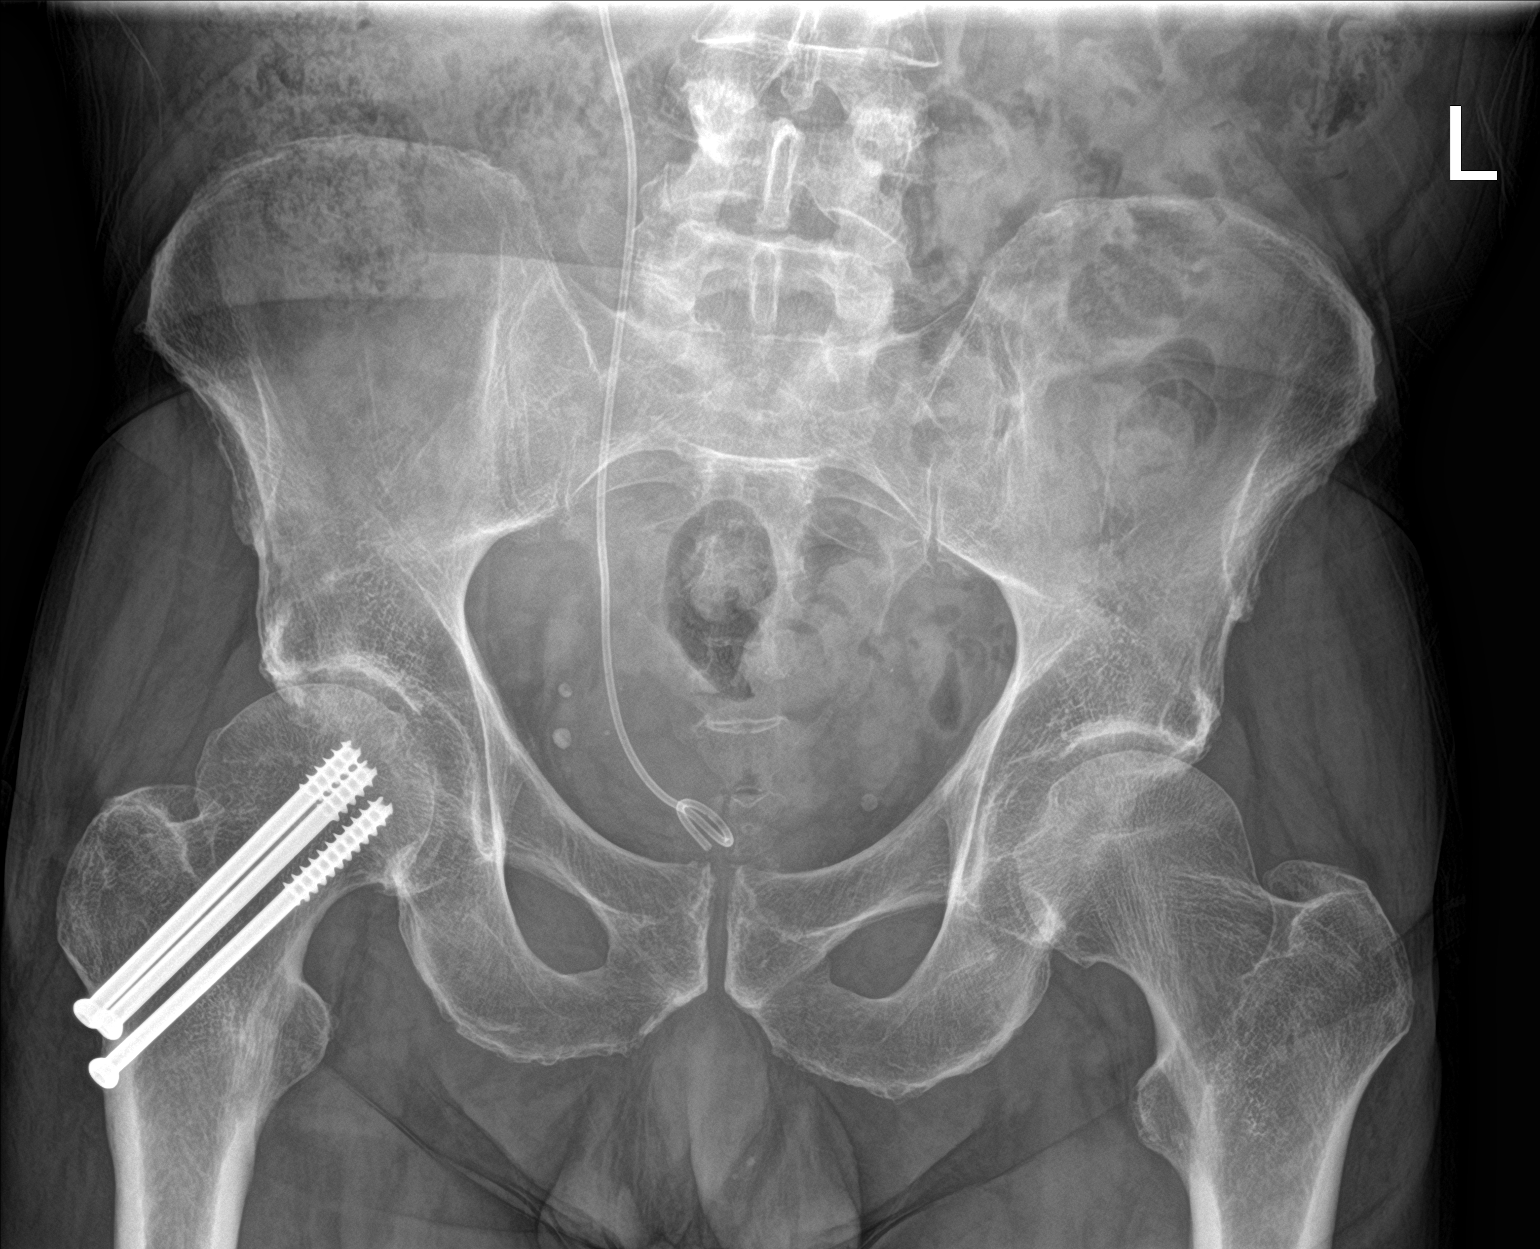
[im 2/3]
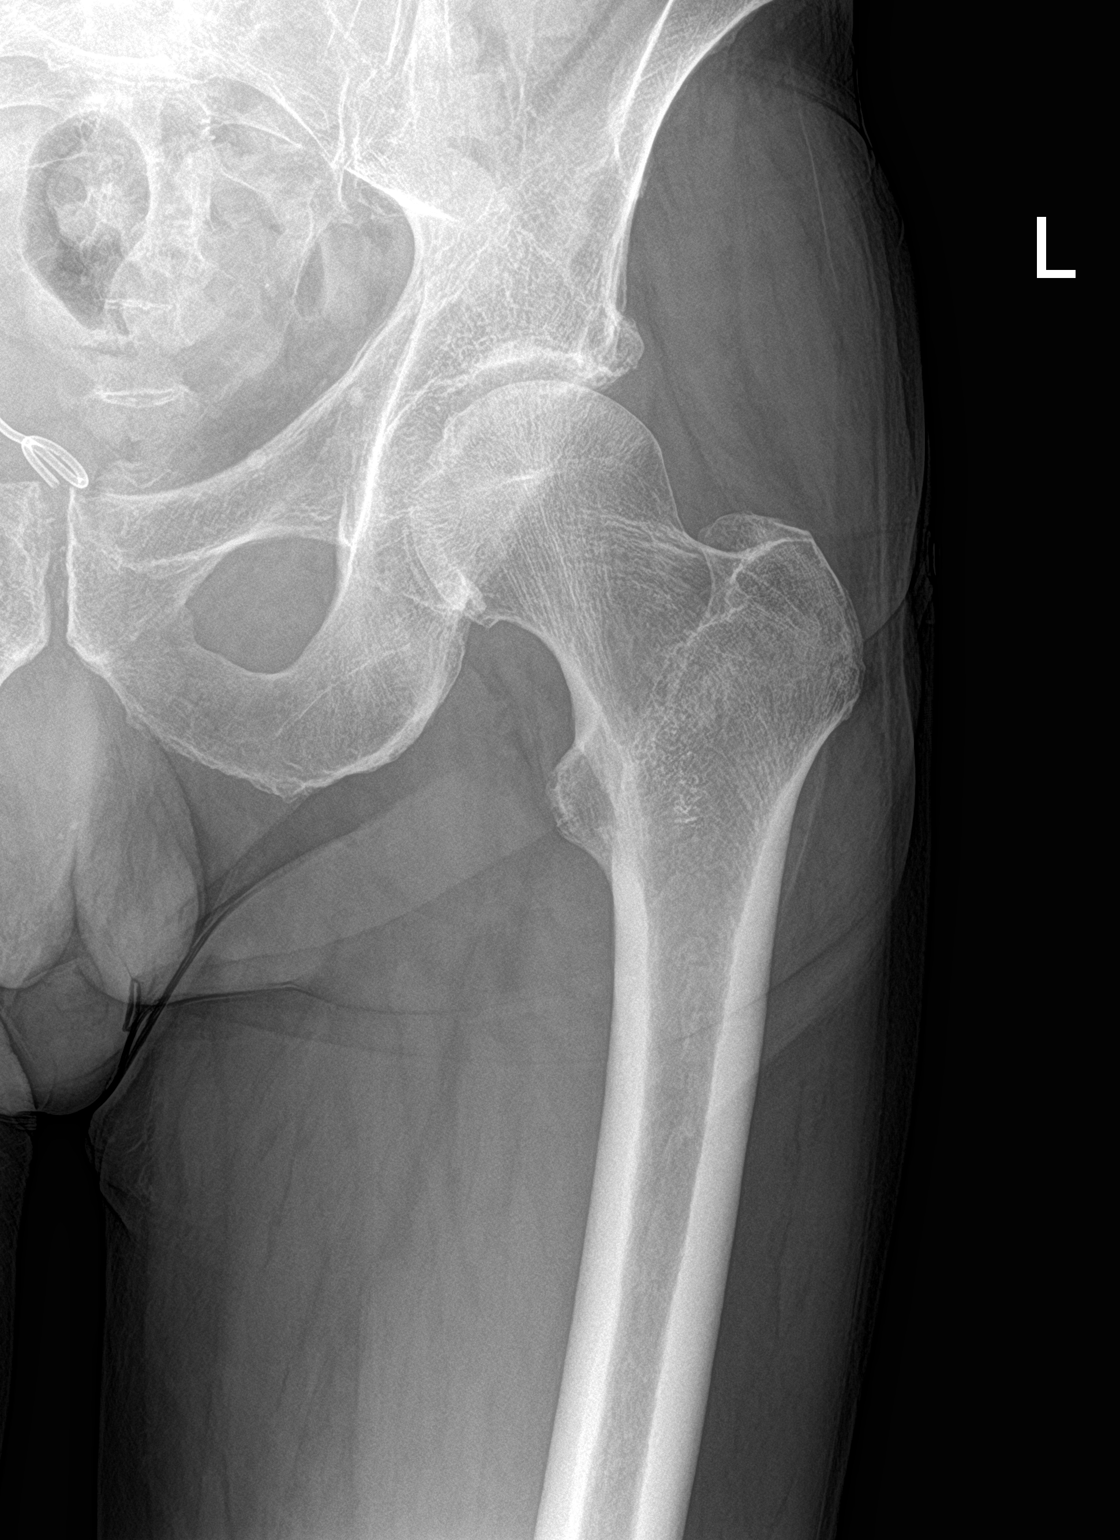
[im 3/3]
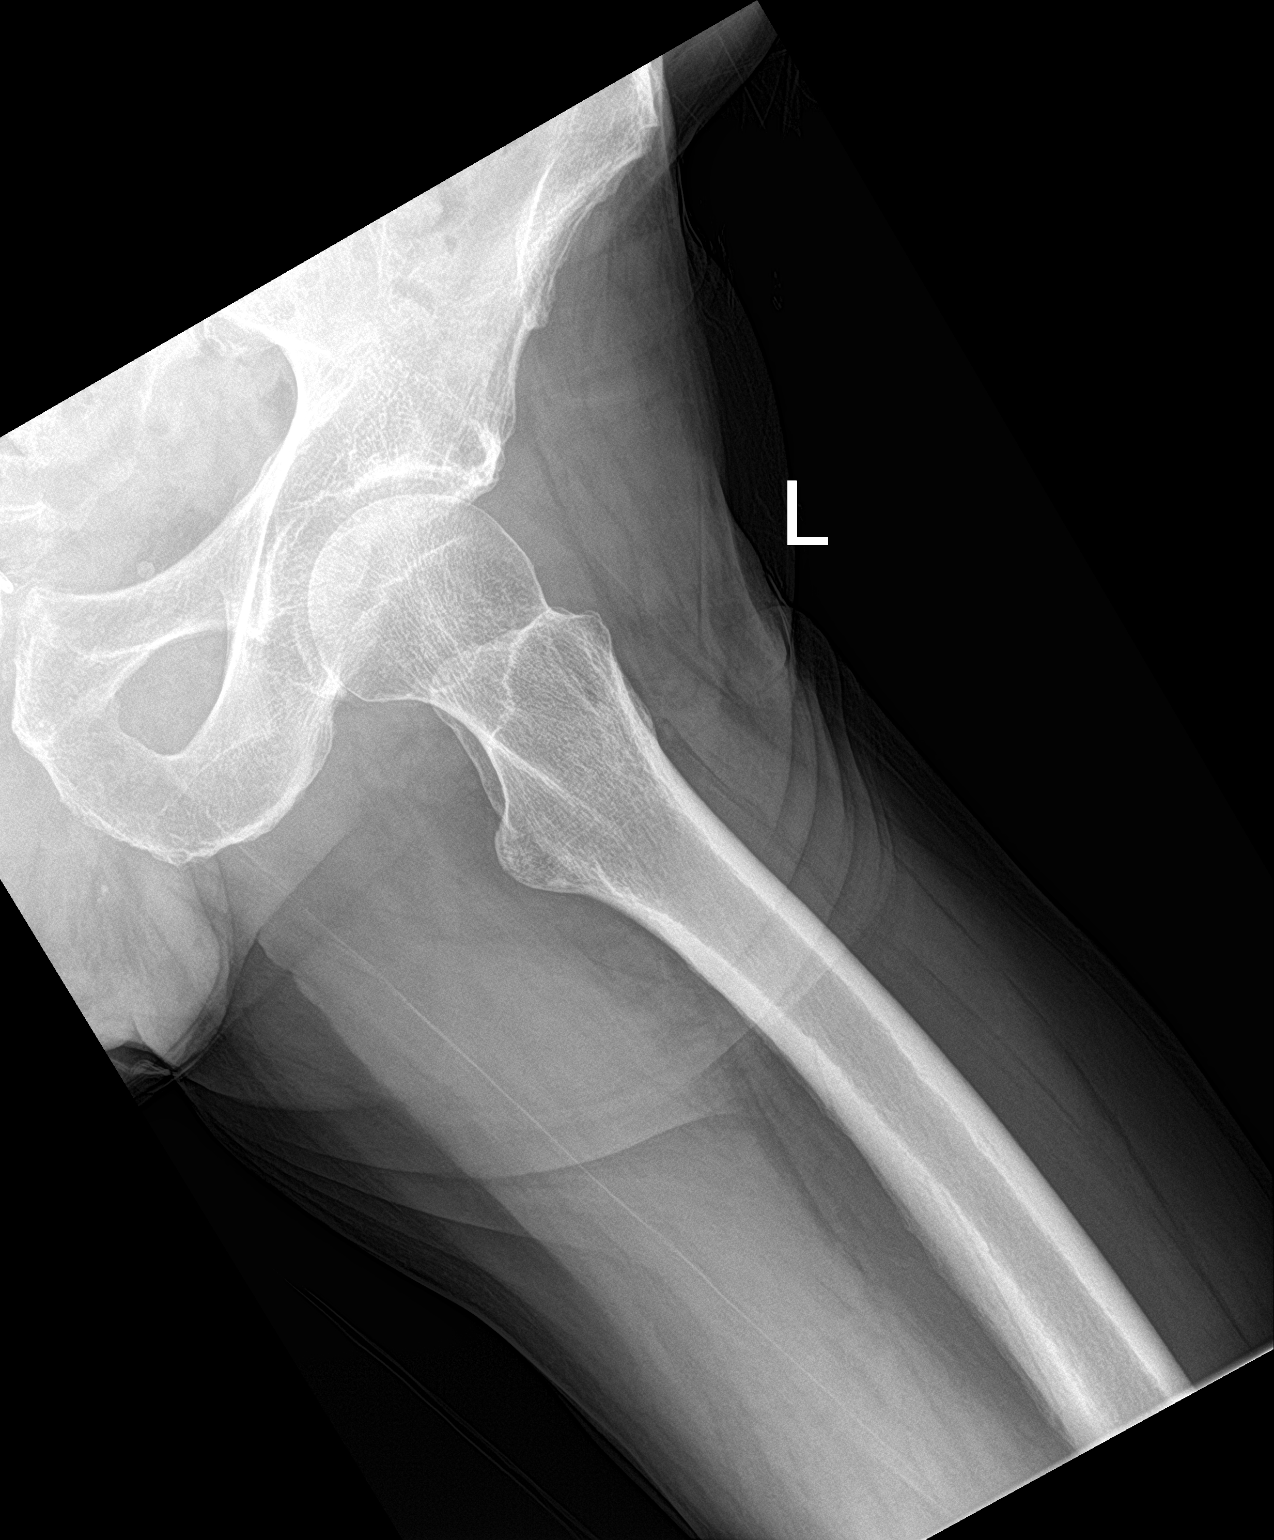

[3 of 3 positions shown; findings below may reference images not displayed]

FINDINGS: There is no evidence of hip fracture or dislocation. Degenerative
joint changes of bilateral hips with narrowed joint space and
osteophyte formation are noted. Status post prior pinning of
proximal right femur. No focal discrete lytic lesion is identified
in the visualized bony structures. Right ureteral catheter is noted.
Bilateral pelvic phleboliths are noted.
IMPRESSION: No acute fracture or dislocation. Degenerative joint changes of
bilateral hips. No focal lesions identified in bilateral hips.

## 2022-09-25 ENCOUNTER — Telehealth: Payer: Self-pay | Admitting: *Deleted

## 2022-09-25 NOTE — Telephone Encounter (Signed)
Called and spoke to patient. Advised him, per PA recommendations, that he can try OTC antihistamine such as benadryl, claritin, or zyrtec, in addition to adding Pepcid, and to take per the package instructions. Pt stated that he has follow-up with Dr. Tasia Catchings this Wednesday, and he plans to try an OTC and hold the gabapentin until he sees MD. Instructed pt to call back if rash worsens and needs to been seen prior to 10/26. He verbalized understanding.

## 2022-09-25 NOTE — Telephone Encounter (Signed)
Patient called stating that he started taking Gabapentin on 10/17 and yesterday he has broke out in a very itching red rash, hives on his back and even below his waist on his back. He does report some on his arms, but not on face chest or legs. He has not taken any Gabapentin his morning and said he will not take it with this rash. He has used Sarna lotion for the rash , but not helping much. He is asking for something to help with itching, Please advise

## 2022-09-26 ENCOUNTER — Telehealth: Payer: Self-pay

## 2022-09-26 ENCOUNTER — Other Ambulatory Visit: Payer: Self-pay

## 2022-09-26 ENCOUNTER — Encounter: Payer: Self-pay | Admitting: Oncology

## 2022-09-26 ENCOUNTER — Inpatient Hospital Stay (HOSPITAL_BASED_OUTPATIENT_CLINIC_OR_DEPARTMENT_OTHER): Payer: Medicare Other | Admitting: Oncology

## 2022-09-26 ENCOUNTER — Ambulatory Visit
Admission: RE | Admit: 2022-09-26 | Discharge: 2022-09-26 | Disposition: A | Discharge status: Home / Self Care | Payer: Self-pay | Source: Ambulatory Visit | Attending: Oncology | Admitting: Oncology

## 2022-09-26 VITALS — BP 120/59 | HR 52 | Temp 97.8°F | Resp 18 | Wt 148.1 lb

## 2022-09-26 DIAGNOSIS — C9000 Multiple myeloma not having achieved remission: Secondary | ICD-10-CM

## 2022-09-26 DIAGNOSIS — C669 Malignant neoplasm of unspecified ureter: Secondary | ICD-10-CM | POA: Diagnosis not present

## 2022-09-26 DIAGNOSIS — N184 Chronic kidney disease, stage 4 (severe): Secondary | ICD-10-CM

## 2022-09-26 DIAGNOSIS — L299 Pruritus, unspecified: Secondary | ICD-10-CM | POA: Insufficient documentation

## 2022-09-26 DIAGNOSIS — K769 Liver disease, unspecified: Secondary | ICD-10-CM | POA: Diagnosis not present

## 2022-09-26 DIAGNOSIS — Z8546 Personal history of malignant neoplasm of prostate: Secondary | ICD-10-CM

## 2022-09-26 DIAGNOSIS — R21 Rash and other nonspecific skin eruption: Secondary | ICD-10-CM | POA: Diagnosis not present

## 2022-09-26 DIAGNOSIS — D631 Anemia in chronic kidney disease: Secondary | ICD-10-CM

## 2022-09-26 DIAGNOSIS — C61 Malignant neoplasm of prostate: Secondary | ICD-10-CM | POA: Insufficient documentation

## 2022-09-26 DIAGNOSIS — Z5112 Encounter for antineoplastic immunotherapy: Secondary | ICD-10-CM | POA: Diagnosis not present

## 2022-09-26 MED ORDER — PREDNISONE 10 MG (21) PO TBPK: ORAL_TABLET | ORAL | 0 refills | Status: DC

## 2022-09-26 NOTE — Assessment & Plan Note (Signed)
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

## 2022-09-26 NOTE — Telephone Encounter (Signed)
PET imaging available in PACS.   Request for Liver biopsy faxed to IR.

## 2022-09-26 NOTE — Telephone Encounter (Signed)
Imaging requested via powershare.

## 2022-09-26 NOTE — Progress Notes (Signed)
Hematology/Oncology Progress note [phone] [phone]      Patient Care Team: Baxter Hire, MD as PCP - General (Internal Medicine) Noreene Filbert, MD as Radiation Oncologist (Radiation Oncology) Baxter Hire, MD (Internal Medicine) Lavonia Dana, MD as Consulting Physician (Nephrology) [name], MD as Consulting Physician (Oncology)  ASSESSMENT & PLAN:   Cancer Staging  Multiple myeloma [facility]) Staging form: Plasma Cell Myeloma and Plasma Cell Disorders, AJCC 8th Edition - Clinical stage from 03/17/2022: RISS Stage III (Beta-2-microglobulin (mg/L): 6.9, Albumin (g/dL): 3.3, ISS: Stage III, High-risk cytogenetics: Present, LDH: Normal) - Signed by [name], MD on 03/17/2022  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 Continue with currently planned velcade treatments.  Hold Aspirin and Revlimid due to upcoming biopsy.  Consider Daratumumab maitenance when he starts chemotherapay for urothelial carcinoma.  Continue Acyclovir 264m BID  -Bone health, currently he does not have an active myeloma bone involvement.  Patient reports history of developing adverse reaction [vision loss] after Prolia treatments. -s/p  root canal.  Hold off bone strengthening agents.   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  He  will need distal ureterectomy.  [facility]  [facility] as well as bladder high-grade papillary carcinoma. [facility].  S/p palliative radiation. [facility], [facility] metastatic disease.  [facility] Dr.MIlowsky. we recommend to proceed with liver biopsy to confirm distant metastasis. Will obtain PET scan images.   Discussed about systemic options. He is not cisplatin eligible. Recommend Keytruda + Padcev.    Skin rash Possible drug reaction, garbapentin, or Velcade.  Recommend patient to continue Benadryl and tapering course of Steroid.   Anemia in stage 4 chronic kidney disease (HCC) Stable hemoglobin. 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    Orders Placed This Encounter  Procedures   [place]OPSY (LIVER)    Standing Status:   Future    Standing Expiration Date:   09/27/2023    Order Specific Question:   Lab orders requested (DO NOT place separate lab orders, these will be automatically ordered during procedure specimen collection):    Answer:   Surgical Pathology    Order Specific Question:   Reason for Exam (SYMPTOM  OR DIAGNOSIS REQUIRED)    Answer:   liver lesion    Order Specific Question:   Preferred location?    Answer:   [facility]    Follow-up To be determined.   All questions were answered. The patient knows to call the clinic with any problems, questions or concerns.  ZEarlie Server MD, PhD [facility] H[facility] Oncology 09/26/2022    CHIEF COMPLAINTS/REASON FOR VISIT:  Multiple myeloma, high-grade urothelial carcinoma.  HISTORY OF PRESENTING ILLNESS:  [name] [name] is a 81 y.o. male who was seen in consultation at the request of Baxter Hire, MD presents for follow-up of multiple myeloma and high-grade urothelial carcinoma management.   Oncology History  Multiple myeloma ([place])  03/02/2022 Imaging   PET showed No definitive signs of multiple myeloma by FDG PET.   Obstruction of the RIGHT ureter with soft tissue mass in the RIGHT pelvis showing increased metabolic activity suspicious first and foremost for urothelial neoplasm given location adjacent to RIGHT UVJ and affect upon the RIGHT ureter.   Little or no excretion of FDG on the RIGHT but still with uptake of FDG by renal parenchyma. Degree of hydronephrosis is not substantially changed but lack of FDG excretion on the RIGHT is compatible with secondary sign of physiologic significance of ureteral obstruction.   RIGHT pelvic sidewall uptake without visible lesion, could represent tiny lymph node not visible on noncontrast imaging.   Aortic atherosclerosis.  Pulmonary emphysema. Aortic Atherosclerosis and Emphysema    03/17/2022 Initial Diagnosis   Multiple myeloma ([place]) -Patient has progressively worsening kidney function.  He is scheduled for kidney biopsy. 01/26/2022, free kappa light chain 1058, lambda 15.3, light chain ratio 69. Protein electrophoresis showed restricted band-M spike migrating in the beta-1 globulin region. ANA negative.  ANCA negative. Random urine protein electrophoresis showed abnormal protein band detected in the gamma globulin And a second possible abnormal protein band that may represent monoclonal protein.   02/23/2022 multiple myeloma showed IgA 2883, M protein of 1.8, beta 2 microglobulin 6.9, kappa free light chain 1268.9, lambda 13.2, free light chain ratio 96.13.   CMP showed creatinine of 3.41, that is significantly worse than his baseline in June 2022. 02/28/2022, 24-hour urine protein electrophoresis showed M protein of 729 mg, IgA monoclonal kappa light chain. 03/01/2022, [facility] kidney biopsy showed diffuse light chain tubulopathy, and  light chain cast nephropathy, moderate interstitial fibrosis,4% global glomerular sclerosis and the marked atherosclerosis.  03/06/2022, patient underwent a bone marrow biopsy. Pathology showed hypercellular bone marrow with plasma cell neoplasm, representing 20% of all cells in the aspirate associated with prominent interstitial infiltrates and numerous predominantly small clusters in the clots in the biopsy sections.  The plasma cells display kappa light chain restriction consistent with plasma cell neoplasm. Normal cytogenetics, myeloma FISH panel positive for 13q deletion, gain of 1q, t (4;14)   03/17/2022 Cancer Staging   Staging form: Plasma Cell Myeloma and Plasma Cell Disorders, AJCC 8th Edition - Clinical stage from 03/17/2022: RISS Stage III (Beta-2-microglobulin (mg/L): 6.9, Albumin (g/dL): 3.3, ISS: Stage III, High-risk cytogenetics: Present, LDH: Normal) - Signed by [name], MD on 03/17/2022 Stage prefix: Initial diagnosis Beta 2 microglobulin range (mg/L): Greater than or equal to 5.5 Albumin range (g/dL): Less than 3.5 Cytogenetics: t(4;14) translocation, Other mutation, 1q addition   03/24/2022 - 07/19/2022 Chemotherapy   Patient is on Treatment Plan : MYELOMA NEWLY DIAGNOSED Daratumumab + Dexamethasone Weekly (DaraRd) q28d     09/21/2022 Imaging   MRI lumbar spine wo contrast 1. 6 mm nodule in the cauda equina at the L4-5 level. 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 Recommend MRI of the lumbar spine with contrast for further evaluation. 2. 12 mm T2 hyperintense lesion along the inferior aspect of the S1 vertebral body. This may represent focal involvement of multiple myeloma. No other discrete lesions are present. No pathologic fracture is present. 3. Mild bilateral foraminal stenosis at L3-4  and L4-5. 4. Left paramedian annular tear at L5-S1 near the left S1 nerve roots.   Urothelial carcinoma of distal ureter ([place])  03/02/2022 Imaging    PET showed No definitive signs of multiple myeloma by FDG PET.   Obstruction of the RIGHT ureter with soft tissue mass in the RIGHT pelvis showing increased metabolic activity suspicious first and foremost for urothelial neoplasm given location adjacent to RIGHT UVJ and affect upon the RIGHT ureter.   Little or no excretion of FDG on the RIGHT but still with uptake of FDG by renal parenchyma. Degree of hydronephrosis is not substantially changed but lack of FDG excretion on the RIGHT is compatible with secondary sign of physiologic significance of ureteral obstruction.   RIGHT pelvic sidewall uptake without visible lesion, could represent tiny lymph node not visible on noncontrast imaging.   Aortic atherosclerosis.  Pulmonary emphysema. Aortic Atherosclerosis and Emphysema    03/17/2022 Initial Diagnosis   Urothelial carcinoma of distal ureter ([place])  -03/14/2022, cystoscopy showed diffuse narrowing right distal ureter with hydroureter following up the distal ureter and extending proximally.  Right ureteroscopy showed nodular tumor distal ureter and a convincing area without tumor suspicious for extrinsic obstruction.s/p   right ureteral stent placement. Ureteral tumor showed a tiny fragments of fibrous tissue, interpretation limited by thermal and crush artifact.  Right distal ureter saline barbotage showed hight grade urothelial carcinoma.    03/17/2022 Cancer Staging   Staging form: Renal Pelvis and Ureter, AJCC 8th Edition - Clinical: Stage Unknown (cTX, cN0, cM0) - Signed by [name], MD on 03/17/2022 Stage prefix: Initial diagnosis WHO/ISUP grade (low/high): High Grade Histologic grading system: 2 grade system   06/05/2022 Imaging   PET scan at [facility] showed 1.Interval placement of right-sided double-J ureteral stent with resolution of hydronephrosis and improved excretion of radiotracer.  2.There is persistent abnormal hypermetabolic tissue extending posterior lateral from the right  aspect of the bladder encasing the distal ureter/stent which measures approximately 5.1 x 4.2 (CT image 243).  3.There are similar linear areas of abnormal hypermetabolic tissue seen to extend more superiorly along the right posterior pelvic wall/peritoneum, and towards the sciatic notch (fused image 238). Linear configuration suggests possible perineural spread. Note that this soft tissue abuts and may encase pelvic vasculature including the external and internal iliac arteries.  4.NEW single subcentimeter but abnormal focus of FDG avidity in the right presacral region (CT image 231) measuring 1.6 cm representing new site of neoplastic disease   06/19/2022 - 08/10/2022 Radiation Therapy   Pelvis RT 06/19/22-08/21/22 Bladder RT 07/27/22- 08/20/22    09/18/2022 Imaging   PET scan at [facility] showed 1. Evidence of disease progression with multifocal new hypermetabolic lesions around the periphery of the liver as described above. The evaluation of these lesions is limited by significant misregistration between the PET and CT portions of the examination. Contrast-enhanced CT or MRI could be used for confirmation and/or guidance of biopsy as necessary. There is also a lung parenchymal nodule with new associated FDG uptake, also suspicious for a site of metastasis.   2. Although the right nephroureteral stent appears to be well positioned, the right kidney demonstrates slightly increased hydronephrosis with significant surrounding fat stranding and relatively delayed clearance of FDG. These findings could suggest stent malfunction or be related to upper urinary tract infection. Suggest clinical correlation with any patient signs or symptoms such as right flank pain, fever, and/or urinalysis findings.     09/21/2022 Imaging   MRI lumbar spine wo contrast 1.  6 mm nodule in the cauda equina at the L4-5 level. 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 Recommend MRI of the lumbar  spine with contrast for further evaluation. 2. 12 mm T2 hyperintense lesion along the inferior aspect of the S1 vertebral body. This may represent focal involvement of multiple myeloma. No other discrete lesions are present. No pathologic fracture is present. 3. Mild bilateral foraminal stenosis at L3-4 and L4-5. 4. Left paramedian annular tear at L5-S1 near the left S1 nerve roots.     INTERVAL HISTORY [name] [name] is a 81 y.o. male who has above history reviewed by me today presents for follow up visit for multiple myeloma and high-grade urothelial carcinoma. Facial swelling is better. He has broke out rash on his back and abdomen, itchy. He takes anti histamines with some relief.  Back pain is better, almost resolved.   Review of Systems  Constitutional:  Positive for fatigue. Negative for appetite change, chills, fever and unexpected weight change.  HENT:   Negative for hearing loss and voice change.   Eyes:  Negative for eye problems and icterus.  Respiratory:  Negative for chest tightness, cough and shortness of breath.   Cardiovascular:  Negative for chest pain and leg swelling.  Gastrointestinal:  Negative for abdominal distention, abdominal pain and diarrhea.  Endocrine: Negative for hot flashes.  Genitourinary:  Positive for frequency. Negative for difficulty urinating and dysuria.   Musculoskeletal:  Negative for arthralgias.  Skin:  Positive for rash. Negative for itching.  Neurological:  Negative for light-headedness and numbness.  Hematological:  Negative for adenopathy. Does not bruise/bleed easily.  Psychiatric/Behavioral:  Negative for confusion.      MEDICAL HISTORY:  Past Medical History:  Diagnosis Date   Age related osteoporosis    Anemia    Barrett's esophagus    Bradycardia    Cancer (HCC)    PROSTATE   Cataract    CKD (chronic kidney disease) stage 3, GFR 30-59 ml/min (HCC)    Colon polyps    DDD (degenerative disc disease), cervical    DDD  (degenerative disc disease), lumbar    Femur fracture (HCC)    Right   Fundic gland polyposis of stomach    Fundic gland polyps of stomach, benign    Gastritis    GERD (gastroesophageal reflux disease)    Hematuria    Hemorrhage of rectum and anus    History of colon polyps    Insomnia    Lumbago    Osteopenia of the elderly    Skin cancer    Sleep apnea     SURGICAL HISTORY: Past Surgical History:  Procedure Laterality Date   BAND HEMORRHOIDECTOMY     COLONOSCOPY     COLONOSCOPY WITH PROPOFOL N/A 05/06/2018   Procedure: COLONOSCOPY WITH PROPOFOL;  Surgeon: Lollie Sails, MD;  Location: [facility] ENDOSCOPY;  Service: Endoscopy;  Laterality: N/A;   COLONOSCOPY WITH PROPOFOL N/A 09/23/2018   Procedure: COLONOSCOPY WITH PROPOFOL;  Surgeon: Lollie Sails, MD;  Location[facility] END[facility]COPY;  Service: Endoscopy;  Laterality: N/A;   COLONOSCOPY WITH PROPOFOL N/A 03/30/2021   Procedure: COLONOSCOPY WITH PROPOFOL;  Surgeon: Toledo, Benay Pike, MD;  Location: [facility] ENDOSCOPY;  Service: Gastroenterology;  Laterality: N/A;   CYSTOSCOPY W/ URETERAL STENT PLACEMENT Right 03/14/2022   Procedure: CYSTOSCOPY WITH RETROGRADE PYELOGRAM/URETERAL STENT PLACEMENT;  Surgeon: Abbie Sons, MD;  Location: [facility] ORS;  Service: Urology;  Laterality: Right;   ESOPHAGOGASTRODUODENOSCOPY (EGD) WITH PROPOFOL  N/A 10/03/2016   Procedure: ESOPHAGOGASTRODUODENOSCOPY (EGD) WITH PROPOFOL;  Surgeon: Lollie Sails, MD;  Location[facility] ENDOSCOPY;  Service: Endoscopy;  Laterality: N/A;   EYE SURGERY     CATARACTS   EYELID SURGERY     FLEXIBLE SIGMOIDOSCOPY     FRACTURE SURGERY Right 2016   femur   FRACTURE SURGERY     ORIF DISTAL FEMUR FRACTURE     TONSILLECTOMY     TRANSURETHRAL RESECTION OF BLADDER TUMOR N/A 03/14/2022   Procedure: TRANSURETHRAL RESECTION OF BLADDER TUMOR (TURBT);  Surgeon: Abbie Sons, MD;  Location: [facility] ORS;  Service: Urology;  Laterality: N/A;    SOCIAL HISTORY: Social History    Socioeconomic History   Marital status: Married    Spouse name: Not on file   Number of children: Not on file   Years of education: Not on file   Highest education level: Not on file  Occupational History   Not on file  Tobacco Use   Smoking status: Former    Packs/day: 1.00    Years: 15.00    Total pack years: 15.00    Types: Cigarettes    Quit date: 10/03/1974    Years since quitting: 48.0   Smokeless tobacco: Never  Vaping Use   Vaping Use: Never used  Substance and Sexual Activity   Alcohol use: Not Currently    Alcohol/week: 1.0 standard drink of alcohol    Types: 1 Cans of beer per week    Comment: 1 beer every 2 weeks   Drug use: No   Sexual activity: Yes    Birth control/protection: None  Other Topics Concern   Not on file  Social History Narrative   ** Merged History Encounter **       Social Determinants of Health   Financial Resource Strain: Not on file  Food Insecurity: Not on file  Transportation Needs: Not on file  Physical Activity: Not on file  Stress: Not on file  Social Connections: Not on file  Intimate Partner Violence: Not on file    FAMILY HISTORY: Family History  Problem Relation Age of Onset   Breast cancer Mother     ALLERGIES:  is allergic to demerol [meperidine hcl], prolia [denosumab], demerol [meperidine], and nsaids.  MEDICATIONS:  Current Outpatient Medications  Medication Sig Dispense Refill   acyclovir (ZOVIRAX) 200 MG capsule Take 1 capsule (200 mg total) by mouth 2 (two) times daily. 60 capsule 3   aspirin EC 81 MG tablet Take 1 tablet (81 mg total) by mouth daily. Swallow whole. 30 tablet 11   Calcium Carbonate (CALCIUM 500 PO) Take 1 tablet by mouth daily.     Cholecalciferol (VITAMIN D3) 125 MCG (5000 UT) CAPS Take 5,000 Units by mouth daily.     ferrous sulfate 325 (65 FE) MG tablet Take 325 mg by mouth daily.     ipratropium (ATROVENT) 0.03 % nasal spray Place 1 spray into both nostrils in the morning.      lenalidomide (REVLIMID) 5 MG capsule Take 1 capsule (5 mg total) by mouth daily. Take for 21 days, then hold for 7 days. Repeat every 28 days. 21 capsule 0   losartan (COZAAR) 25 MG tablet Take 25 mg by mouth daily.     melatonin 5 MG TABS Take 5 mg by mouth at bedtime.     montelukast (SINGULAIR) 10 MG tablet Take 1 tablet (10 mg total) by mouth See admin instructions. Take 1 tablet the day prior to Daratumumab treatments,  and take 1 tablet daily for 2 days after treatments 30 tablet 1   Multiple Vitamins-Minerals (MENS 50+ MULTIVITAMIN) TABS Take 1 tablet by mouth daily.     predniSONE (STERAPRED UNI-PAK 21 TAB) 10 MG (21) TBPK tablet Day 1 take 6 tablets, Day 2 take 5 tablets, Day 3 take 4 tablets, Day 4 take 3 tablets, Day 5 take 2 tablets D6 take 1 tablet 1 each 0   senna (SENOKOT) 8.6 MG TABS tablet Take 2 tablets (17.2 mg total) by mouth daily. 120 tablet 0   traZODone (DESYREL) 50 MG tablet Take 25 mg by mouth at bedtime.     gabapentin (NEURONTIN) 100 MG capsule Take 1 capsule (100 mg total) by mouth 2 (two) times daily. Start once daily at bedtime, after 3 days, you may increase to twice daily. Avoid driving after taking medication. (Patient not taking: Reported on 09/26/2022) 60 capsule 0   trospium (SANCTURA) 20 MG tablet Take by mouth. Take 1 tablet (20 mg total) by mouth daily as needed (as needed for urinary urgency). (Patient not taking: Reported on 09/26/2022)     No current facility-administered medications for this visit.     PHYSICAL EXAMINATION: ECOG PERFORMANCE STATUS: 1 - Symptomatic but completely ambulatory Today's Vitals   09/26/22 1028  BP: (!) 120/59  Pulse: (!) 52  Resp: 18  Temp: 97.8 F (36.6 C)  Weight: 148 lb 1.6 oz (67.2 kg)  PainSc: 0-No pain   Body mass index is 23.2 kg/m.   Physical Exam Constitutional:      General: He is not in acute distress. HENT:     Head: Normocephalic and atraumatic.  Eyes:     General: No scleral  icterus. Cardiovascular:     Rate and Rhythm: Normal rate and regular rhythm.  Pulmonary:     Effort: Pulmonary effort is normal. No respiratory distress.     Breath sounds: No wheezing.  Abdominal:     General: There is no distension.     Palpations: Abdomen is soft.  Musculoskeletal:        General: No deformity. Normal range of motion.     Cervical back: Normal range of motion and neck supple.  Skin:    General: Skin is warm and dry.     Findings: Rash present. No erythema.  Neurological:     Mental Status: He is alert and oriented to person, place, and time. Mental status is at baseline.     Cranial Nerves: No cranial nerve deficit.     Coordination: Coordination normal.  Psychiatric:        Mood and Affect: Mood normal.      LABORATORY DATA:  I have reviewed the data as listed    Latest Ref Rng & Units 09/21/2022   12:44 PM 09/14/2022    8:49 AM 09/07/2022   12:41 PM  CBC  WBC 4.0 - 10.5 K/uL 6.7  5.0  4.0   Hemoglobin 13.0 - 17.0 g/dL 10.8  10.6  9.8   Hematocrit 39.0 - 52.0 % 33.7  31.8  29.6   Platelets 150 - 400 K/uL 183  190  154        Latest Ref Rng & Units 09/21/2022   12:44 PM 09/14/2022    8:49 AM 09/07/2022   12:41 PM  CMP  Glucose 70 - 99 mg/dL 118  78  107   BUN 8 - 23 mg/dL 34  41  35   Creatinine 0.61 - 1.24 mg/dL 2.33  2.42  2.21   Sodium 135 - 145 mmol/L 142  141  138   Potassium 3.5 - 5.1 mmol/L 4.9  4.4  3.9   Chloride 98 - 111 mmol/L 108  111  111   CO2 22 - 32 mmol/L '26  26  24   ' Calcium 8.9 - 10.3 mg/dL 8.7  8.6  7.8   Total Protein 6.5 - 8.1 g/dL 5.7  5.8  5.5   Total Bilirubin 0.3 - 1.2 mg/dL 0.4  0.5  0.4   Alkaline Phos 38 - 126 U/L 57  65  56   AST 15 - 41 U/L '19  19  23   ' ALT 0 - 44 U/L '18  21  19     ' Iron/TIBC/Ferritin/ %Sat    Component Value Date/Time   IRON 60 02/23/2022 1523   TIBC 238 (L) 02/23/2022 1523   FERRITIN 86 02/23/2022 1523   IRONPCTSAT 25 02/23/2022 1523       RADIOGRAPHIC STUDIES: I have  personally reviewed the radiological images as listed and agreed with the findings in the report.  US Venous Img Upper Uni Right  Result Date: 09/22/2022 CLINICAL DATA:  81 year old male with a history of facial swelling EXAM: RIGHT UPPER EXTREMITY VENOUS DOPPLER ULTRASOUND TECHNIQUE: Gray-scale sonography with graded compression, as well as color Doppler and duplex ultrasound were performed to evaluate the upper extremity deep venous system from the level of the subclavian vein and including the jugular, axillary, basilic, radial, ulnar and upper cephalic vein. Spectral Doppler was utilized to evaluate flow at rest and with distal augmentation maneuvers. COMPARISON:  None Available. FINDINGS: Contralateral Subclavian Vein: Respiratory phasicity is normal and symmetric with the symptomatic side. No evidence of thrombus. Normal compressibility. Internal Jugular Vein: No evidence of thrombus. Normal compressibility, respiratory phasicity and response to augmentation. Subclavian Vein: No evidence of thrombus. Normal compressibility, respiratory phasicity and response to augmentation. Axillary Vein: No evidence of thrombus. Normal compressibility, respiratory phasicity and response to augmentation. Cephalic Vein: No evidence of thrombus. Normal compressibility, respiratory phasicity and response to augmentation. Basilic Vein: No evidence of thrombus. Normal compressibility, respiratory phasicity and response to augmentation. Brachial Veins: No evidence of thrombus. Normal compressibility, respiratory phasicity and response to augmentation. Radial Veins: No evidence of thrombus. Normal compressibility, respiratory phasicity and response to augmentation. Ulnar Veins: No evidence of thrombus. Normal compressibility, respiratory phasicity and response to augmentation. Other Findings:  None visualized. IMPRESSION: Directed duplex of the right upper extremity negative for DVT Signed, Dulcy Fanny. Nadene Rubins, RPVI  [facility] Radiology Electronically Signed   By: Corrie Mckusick D.O.   On: 09/22/2022 16:35   MR Lumbar Spine Wo Contrast  Result Date: 09/21/2022 CLINICAL DATA:  Low back pain. Multiple myeloma. Increased fracture risk. Pain extends into the left lower extremity. EXAM: MRI LUMBAR SPINE WITHOUT CONTRAST TECHNIQUE: Multiplanar, multisequence MR imaging of the lumbar spine was performed. No intravenous contrast was administered. COMPARISON:  CT of the abdomen and pelvis 02/07/2019. Whole-body bone scan 02/07/2019. FINDINGS: Segmentation: 5 non rib-bearing lumbar type vertebral bodies are present. The lowest fully formed vertebral body is L5. Alignment: No significant listhesis is present. Lumbar lordosis is preserved. Vertebrae: A T2 hyperintense lesion along the inferior aspect of the S1 vertebral body measures up to 12 mm. No other discrete marrow lesions are present. Vertebral body heights are normal. Conus medullaris and cauda equina: Conus extends to the T12-L1 level. Conus is within normal limits. A 6 mm  nodule is present in the cauda equina at the L4-5 level. Nerve roots are otherwise within normal limits. Paraspinal and other soft tissues: Benign cysts of the liver are again noted. A simple cyst in the posterior aspect of the left kidney is stable measuring 27 mm. No other discrete solid organ lesions are present. No significant adenopathy is present. Disc levels: T12-L1: Insert normal disc level L1-2: Insert normal disc level L2-3: Mild disc desiccation is present. No significant herniation or stenosis is present. L3-4: A mild broad-based disc bulge is present. Disc extends into the foramina bilaterally. Mild bilateral foraminal stenosis is present. Central canal is patent. L4-5: Mild lateral disc bulging is present. Facet hypertrophy and short pedicles contribute to mild bilateral foraminal stenosis. L5-S1: A broad-based disc protrusion is present. Left  paramedian annular tear is present near the left S1 nerve roots. Disc material extends into the foramina bilaterally without significant stenosis. IMPRESSION: 1. 6 mm nodule in the cauda equina at the L4-5 level. 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 Recommend MRI of the lumbar spine with contrast for further evaluation. 2. 12 mm T2 hyperintense lesion along the inferior aspect of the S1 vertebral body. This may represent focal involvement of multiple myeloma. No other discrete lesions are present. No pathologic fracture is present. 3. Mild bilateral foraminal stenosis at L3-4 and L4-5. 4. Left paramedian annular tear at L5-S1 near the left S1 nerve roots. Electronically Signed   By: San Morelle M.D.   On: 09/21/2022 10:11

## 2022-09-26 NOTE — Assessment & Plan Note (Signed)
Possible drug reaction, garbapentin, or Velcade.  Recommend patient to continue Benadryl and tapering course of Steroid.

## 2022-09-26 NOTE — Assessment & Plan Note (Signed)
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 Continue with currently planned velcade treatments.  Hold Aspirin and Revlimid due to upcoming biopsy.  Consider Daratumumab maitenance when he starts chemotherapay for urothelial carcinoma.  Continue Acyclovir 277m BID  -Bone health, currently he does not have an active myeloma bone involvement.  Patient reports history of developing adverse reaction [vision loss] after Prolia treatments. -s/p  root canal.  Hold off bone strengthening agents.

## 2022-09-26 NOTE — Progress Notes (Signed)
Pt reports started taking gabapentin last Tuesday and by Saturday he had a lot of itching and hives all over back and down to belt line. Pt reports taking benadryl. Reports no improvement.

## 2022-09-26 NOTE — Telephone Encounter (Signed)
Obtain PET images from [facility] US guided liver biopsy once we have imaging.

## 2022-09-26 NOTE — Assessment & Plan Note (Signed)
Stable hemoglobin. monitor 

## 2022-09-26 NOTE — Assessment & Plan Note (Signed)
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  He will need distal ureterectomy.  Pending [facility] urology oncology evaluation.  +/- Chemotherapy/RT cystoscopy at [facility] showed ureter high-grade carcinoma as well as bladder high-grade papillary carcinoma. His case was discussed on [facility] Urology-oncology tumor board.  S/p palliative radiation. Recent PET scan at [facility] showed disease progression, liver metastatic disease.  I discussed with [facility] GU oncologist Dr.MIlowsky. we recommend to proceed with liver biopsy to confirm distant metastasis. Will obtain PET scan images.   Discussed about systemic options. He is not cisplatin eligible. Recommend Keytruda + Padcev.

## 2022-09-27 ENCOUNTER — Other Ambulatory Visit: Payer: Medicare Other

## 2022-09-27 ENCOUNTER — Encounter: Payer: Self-pay | Admitting: Oncology

## 2022-09-27 ENCOUNTER — Ambulatory Visit: Payer: Medicare Other

## 2022-09-27 NOTE — Telephone Encounter (Signed)
Pt scheduled for biopsy on Fri 11/3 at 9:30a and arrive at 8:30a. Pt informed of appt.

## 2022-09-28 ENCOUNTER — Encounter: Payer: Self-pay | Admitting: Radiation Oncology

## 2022-09-28 ENCOUNTER — Other Ambulatory Visit: Payer: Self-pay | Admitting: Oncology

## 2022-09-28 ENCOUNTER — Other Ambulatory Visit: Payer: Medicare Other

## 2022-09-28 ENCOUNTER — Inpatient Hospital Stay: Payer: Medicare Other

## 2022-09-28 ENCOUNTER — Inpatient Hospital Stay: Payer: Medicare Other | Admitting: Oncology

## 2022-09-28 ENCOUNTER — Ambulatory Visit: Payer: Medicare Other

## 2022-09-28 ENCOUNTER — Ambulatory Visit
Admission: RE | Admit: 2022-09-28 | Discharge: 2022-09-28 | Disposition: A | Discharge status: Home / Self Care | Payer: Medicare Other | Source: Ambulatory Visit | Attending: Radiation Oncology | Admitting: Radiation Oncology

## 2022-09-28 VITALS — BP 151/47 | HR 47 | Temp 96.3°F | Resp 16 | Ht 67.0 in | Wt 151.0 lb

## 2022-09-28 VITALS — BP 129/38 | HR 40 | Temp 96.1°F | Resp 16

## 2022-09-28 DIAGNOSIS — C61 Malignant neoplasm of prostate: Secondary | ICD-10-CM | POA: Insufficient documentation

## 2022-09-28 DIAGNOSIS — C674 Malignant neoplasm of posterior wall of bladder: Secondary | ICD-10-CM | POA: Insufficient documentation

## 2022-09-28 DIAGNOSIS — Z923 Personal history of irradiation: Secondary | ICD-10-CM | POA: Insufficient documentation

## 2022-09-28 DIAGNOSIS — C661 Malignant neoplasm of right ureter: Secondary | ICD-10-CM | POA: Insufficient documentation

## 2022-09-28 DIAGNOSIS — Z5112 Encounter for antineoplastic immunotherapy: Secondary | ICD-10-CM | POA: Diagnosis not present

## 2022-09-28 DIAGNOSIS — N1330 Unspecified hydronephrosis: Secondary | ICD-10-CM | POA: Insufficient documentation

## 2022-09-28 DIAGNOSIS — C689 Malignant neoplasm of urinary organ, unspecified: Secondary | ICD-10-CM

## 2022-09-28 DIAGNOSIS — C9000 Multiple myeloma not having achieved remission: Secondary | ICD-10-CM

## 2022-09-28 LAB — COMPREHENSIVE METABOLIC PANEL
ALT: 16 U/L (ref 0–44)
AST: 17 U/L (ref 15–41)
Albumin: 3.4 g/dL — ABNORMAL LOW (ref 3.5–5.0)
Alkaline Phosphatase: 67 U/L (ref 38–126)
Anion gap: 8 (ref 5–15)
BUN: 59 mg/dL — ABNORMAL HIGH (ref 8–23)
CO2: 24 mmol/L (ref 22–32)
Calcium: 9.1 mg/dL (ref 8.9–10.3)
Chloride: 107 mmol/L (ref 98–111)
Creatinine, Ser: 2.71 mg/dL — ABNORMAL HIGH (ref 0.61–1.24)
GFR, Estimated: 23 mL/min — ABNORMAL LOW (ref >60)
Glucose, Bld: 125 mg/dL — ABNORMAL HIGH (ref 70–99)
Potassium: 4.3 mmol/L (ref 3.5–5.1)
Sodium: 139 mmol/L (ref 135–145)
Total Bilirubin: 0.5 mg/dL (ref 0.3–1.2)
Total Protein: 5.7 g/dL — ABNORMAL LOW (ref 6.5–8.1)

## 2022-09-28 LAB — CBC WITH DIFFERENTIAL/PLATELET
Abs Immature Granulocytes: 0.05 10*3/uL (ref 0.00–0.07)
Basophils Absolute: 0 10*3/uL (ref 0.0–0.1)
Basophils Relative: 0 %
Eosinophils Absolute: 0.9 10*3/uL — ABNORMAL HIGH (ref 0.0–0.5)
Eosinophils Relative: 8 %
HCT: 32.6 % — ABNORMAL LOW (ref 39.0–52.0)
Hemoglobin: 10.5 g/dL — ABNORMAL LOW (ref 13.0–17.0)
Immature Granulocytes: 0 %
Lymphocytes Relative: 5 %
Lymphs Abs: 0.6 10*3/uL — ABNORMAL LOW (ref 0.7–4.0)
MCH: 30.9 pg (ref 26.0–34.0)
MCHC: 32.2 g/dL (ref 30.0–36.0)
MCV: 95.9 fL (ref 80.0–100.0)
Monocytes Absolute: 1 10*3/uL (ref 0.1–1.0)
Monocytes Relative: 9 %
Neutro Abs: 8.6 10*3/uL — ABNORMAL HIGH (ref 1.7–7.7)
Neutrophils Relative %: 78 %
Platelets: 175 10*3/uL (ref 150–400)
RBC: 3.4 MIL/uL — ABNORMAL LOW (ref 4.22–5.81)
RDW: 14.1 % (ref 11.5–15.5)
WBC: 11.3 10*3/uL — ABNORMAL HIGH (ref 4.0–10.5)
nRBC: 0 % (ref 0.0–0.2)

## 2022-09-28 MED ORDER — PROCHLORPERAZINE MALEATE 10 MG PO TABS
10.0000 mg | ORAL_TABLET | Freq: Four times a day (QID) | ORAL | Status: DC | PRN
  Administered 2022-09-28: 10 mg via ORAL
  Filled 2022-09-28: qty 1

## 2022-09-28 MED ORDER — DEXAMETHASONE 4 MG PO TABS
20.0000 mg | ORAL_TABLET | Freq: Once | ORAL | Status: AC
  Administered 2022-09-28: 20 mg via ORAL
  Filled 2022-09-28: qty 5

## 2022-09-28 MED ORDER — DIPHENHYDRAMINE HCL 25 MG PO CAPS
50.0000 mg | ORAL_CAPSULE | Freq: Once | ORAL | Status: AC
  Administered 2022-09-28: 50 mg via ORAL
  Filled 2022-09-28: qty 2

## 2022-09-28 MED ORDER — BORTEZOMIB CHEMO SQ INJECTION 3.5 MG (2.5MG/ML)
1.3000 mg/m2 | Freq: Once | INTRAMUSCULAR | Status: AC
  Administered 2022-09-28: 2.25 mg via SUBCUTANEOUS
  Filled 2022-09-28: qty 0.9

## 2022-09-28 NOTE — Progress Notes (Signed)
Radiation Oncology Follow up Note  Name: [name] [name]   Date:   09/28/2022 MRN:  [id number] DOB: 01-Jul-1941    This 81 y.o. 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  He is seen today in routine follow-up he has had a bout of sciatica although this may be related to disease involving his lumbar spine..  He had a recent MRI scan which I have reviewed showing a 6 mm nodule in the cauda equina which may represent a schwannoma.  He also had a 12 mm T2 hypertense lesion along aspect of the S1 vertebral body which may represent focal involvement of multiple myeloma.  Also of interest he had an annular tear at L5-S1 near the left S1 nerve root.  Also recently had a PET CT scan showing evidence of disease compression with multiple focal new hypermetabolic lesions around the periphery of the liver.  Biopsy of those lesions are in the process of being performed.  The right nephroureteral stent appears to be well-positioned although the right kidney demonstrates slightly increased hydronephrosis.  In the pelvic region where we radiated there seems to be a significant response to therapy with pelvic mass much smaller and less avid on recent study.  COMPLICATIONS OF TREATMENT: none  FOLLOW UP COMPLIANCE: keeps appointments   PHYSICAL EXAM:  BP (!) 151/47   Pulse (!) 47   Temp (!) 96.3 F (35.7 C)   Resp 16   Ht 5' 7" (1.702 m)   Wt 151 lb (68.5 kg)   BMI 23.65 kg/m  Thin male in NAD.  Well-developed well-nourished patient in NAD.  HEENT reveals PERLA, EOMI, discs not visualized.  Oral cavity is clear. No oral mucosal lesions are identified. Neck is clear without evidence of cervical or supraclavicular adenopathy. Lungs are clear to A&P. Cardiac examination is essentially unremarkable with regular rate and rhythm without murmur rub or thrill. Abdomen is benign with no organomegaly or masses noted. Motor sensory and DTR levels are equal and symmetric in the upper and lower extremities. Cranial nerves II through XII are grossly intact. Proprioception is intact. No peripheral adenopathy or edema is identified. No motor or sensory levels are noted. Crude visual fields are within normal range.  RADIOLOGY RESULTS: MRI results reviewed scans have been requested for my review  PLAN: Present time patient appears to have had a excellent result to therapy in his pelvic region.  Certainly concerning the areas of involvement by PET/CT around the periphery of his liver.  We will review those findings when they become available.  I have asked to see the patient back in 4 months for follow-up.  I requested all scans and biopsy results as they become available.  Patient knows to call with any concerns at any time.  I would like to take this opportunity to thank you for allowing me to participate in the care of your patient.Noreene Filbert, MD

## 2022-09-28 NOTE — Patient Instructions (Signed)
[facility] [place]  Discharge Instructions: Thank you for choosing [facility] to provide your oncology and hematology care.  If you have a lab appointment with the Cancer Center, please go directly to the Cancer Center and check in at the registration area.  Wear comfortable clothing and clothing appropriate for easy access to any Portacath or PICC line.   We strive to give you quality time with your provider. You may need to reschedule your appointment if you arrive late (15 or more minutes).  Arriving late affects you and other patients whose appointments are after yours.  Also, if you miss three or more appointments without notifying the office, you may be dismissed from the clinic at the provider's discretion.      For prescription refill requests, have your pharmacy contact our office and allow 72 hours for refills to be completed.    Today you received the following chemotherapy and/or immunotherapy agents Velcade      To help prevent nausea and vomiting after your treatment, we encourage you to take your nausea medication as directed.  BELOW ARE SYMPTOMS THAT SHOULD BE REPORTED IMMEDIATELY: *FEVER GREATER THAN 100.4 F (38 C) OR HIGHER *CHILLS OR SWEATING *NAUSEA AND VOMITING THAT IS NOT CONTROLLED WITH YOUR NAUSEA MEDICATION *UNUSUAL SHORTNESS OF BREATH *UNUSUAL BRUISING OR BLEEDING *URINARY PROBLEMS (pain or burning when urinating, or frequent urination) *BOWEL PROBLEMS (unusual diarrhea, constipation, pain near the anus) TENDERNESS IN MOUTH AND THROAT WITH OR WITHOUT PRESENCE OF ULCERS (sore throat, sores in mouth, or a toothache) UNUSUAL RASH, SWELLING OR PAIN  UNUSUAL VAGINAL DISCHARGE OR ITCHING   Items with * indicate a potential emergency and should be followed up as soon as possible or go to the Emergency Department if any problems should occur.  Please show the CHEMOTHERAPY ALERT CARD or IMMUNOTHERAPY ALERT CARD at check-in to the  Emergency Department and triage nurse.  Should you have questions after your visit or need to cancel or reschedule your appointment, please contact [facility] [place]  [phone] and follow the prompts.  Office hours are 8:00 a.m. to 4:30 p.m. Monday - Friday. Please note that voicemails left after 4:00 p.m. may not be returned until the following business day.  We are closed weekends and major holidays. You have access to a nurse at all times for urgent questions. Please call the main number to the clinic [phone] and follow the prompts.  For any non-urgent questions, you may also contact your provider using MyChart. We now offer e-Visits for anyone 18 and older to request care online for non-urgent symptoms. For details visit [email or web address].   Also download the MyChart app! Go to the app store, search "MyChart", open the app, select [place], and log in with your MyChart username and password.  Masks are optional in the cancer centers. If you would like for your care team to wear a mask while they are taking care of you, please let them know. For doctor visits, patients may have with them one support person who is at least 83 years old. At this time, visitors are not allowed in the infusion area.   

## 2022-09-29 ENCOUNTER — Other Ambulatory Visit: Payer: Self-pay

## 2022-09-29 NOTE — Progress Notes (Signed)
Patient for US guided Liver Mass Biopsy on Fri 10/06/22, I called and spoke with the patient on the phone and gave pre-procedure instructions. Pt was made aware to be here at 8:30a, ASA last dose is Sat 09/30/22, NPO after MN prior to procedure as well as driver post procedure/recovery/discharge. Pt stated understanding.  Called 09/29/22

## 2022-10-01 ENCOUNTER — Encounter: Payer: Self-pay | Admitting: Oncology

## 2022-10-04 ENCOUNTER — Other Ambulatory Visit: Payer: Medicare Other

## 2022-10-04 ENCOUNTER — Ambulatory Visit: Payer: Medicare Other

## 2022-10-05 ENCOUNTER — Inpatient Hospital Stay: Payer: Medicare Other

## 2022-10-05 ENCOUNTER — Inpatient Hospital Stay: Payer: Medicare Other | Attending: Oncology

## 2022-10-05 ENCOUNTER — Other Ambulatory Visit: Payer: Self-pay | Admitting: Radiology

## 2022-10-05 VITALS — BP 138/40 | HR 41 | Temp 98.0°F | Resp 16

## 2022-10-05 DIAGNOSIS — M51A3 Intervertebral annulus fibrosus defect, lumbosacral region, unspecified size: Secondary | ICD-10-CM | POA: Diagnosis not present

## 2022-10-05 DIAGNOSIS — C679 Malignant neoplasm of bladder, unspecified: Secondary | ICD-10-CM | POA: Diagnosis not present

## 2022-10-05 DIAGNOSIS — M48061 Spinal stenosis, lumbar region without neurogenic claudication: Secondary | ICD-10-CM | POA: Insufficient documentation

## 2022-10-05 DIAGNOSIS — M5137 Other intervertebral disc degeneration, lumbosacral region: Secondary | ICD-10-CM | POA: Diagnosis not present

## 2022-10-05 DIAGNOSIS — C61 Malignant neoplasm of prostate: Secondary | ICD-10-CM | POA: Diagnosis not present

## 2022-10-05 DIAGNOSIS — Z5112 Encounter for antineoplastic immunotherapy: Secondary | ICD-10-CM | POA: Diagnosis not present

## 2022-10-05 DIAGNOSIS — N1330 Unspecified hydronephrosis: Secondary | ICD-10-CM | POA: Diagnosis not present

## 2022-10-05 DIAGNOSIS — R21 Rash and other nonspecific skin eruption: Secondary | ICD-10-CM | POA: Diagnosis not present

## 2022-10-05 DIAGNOSIS — N184 Chronic kidney disease, stage 4 (severe): Secondary | ICD-10-CM | POA: Diagnosis not present

## 2022-10-05 DIAGNOSIS — Z7961 Long term (current) use of immunomodulator: Secondary | ICD-10-CM | POA: Diagnosis not present

## 2022-10-05 DIAGNOSIS — Z7982 Long term (current) use of aspirin: Secondary | ICD-10-CM | POA: Insufficient documentation

## 2022-10-05 DIAGNOSIS — C9000 Multiple myeloma not having achieved remission: Secondary | ICD-10-CM | POA: Insufficient documentation

## 2022-10-05 DIAGNOSIS — Z01818 Encounter for other preprocedural examination: Secondary | ICD-10-CM

## 2022-10-05 DIAGNOSIS — Z79899 Other long term (current) drug therapy: Secondary | ICD-10-CM | POA: Insufficient documentation

## 2022-10-05 DIAGNOSIS — Z9221 Personal history of antineoplastic chemotherapy: Secondary | ICD-10-CM | POA: Insufficient documentation

## 2022-10-05 DIAGNOSIS — C787 Secondary malignant neoplasm of liver and intrahepatic bile duct: Secondary | ICD-10-CM | POA: Diagnosis not present

## 2022-10-05 DIAGNOSIS — M549 Dorsalgia, unspecified: Secondary | ICD-10-CM | POA: Diagnosis not present

## 2022-10-05 DIAGNOSIS — I700 Atherosclerosis of aorta: Secondary | ICD-10-CM | POA: Diagnosis not present

## 2022-10-05 DIAGNOSIS — Z79624 Long term (current) use of inhibitors of nucleotide synthesis: Secondary | ICD-10-CM | POA: Diagnosis not present

## 2022-10-05 DIAGNOSIS — J439 Emphysema, unspecified: Secondary | ICD-10-CM | POA: Diagnosis not present

## 2022-10-05 LAB — CBC WITH DIFFERENTIAL/PLATELET
Abs Immature Granulocytes: 0.01 10*3/uL (ref 0.00–0.07)
Basophils Absolute: 0.1 10*3/uL (ref 0.0–0.1)
Basophils Relative: 1 %
Eosinophils Absolute: 1.1 10*3/uL — ABNORMAL HIGH (ref 0.0–0.5)
Eosinophils Relative: 22 %
HCT: 34.1 % — ABNORMAL LOW (ref 39.0–52.0)
Hemoglobin: 10.9 g/dL — ABNORMAL LOW (ref 13.0–17.0)
Immature Granulocytes: 0 %
Lymphocytes Relative: 13 %
Lymphs Abs: 0.6 10*3/uL — ABNORMAL LOW (ref 0.7–4.0)
MCH: 31.1 pg (ref 26.0–34.0)
MCHC: 32 g/dL (ref 30.0–36.0)
MCV: 97.2 fL (ref 80.0–100.0)
Monocytes Absolute: 0.8 10*3/uL (ref 0.1–1.0)
Monocytes Relative: 17 %
Neutro Abs: 2.2 10*3/uL (ref 1.7–7.7)
Neutrophils Relative %: 47 %
Platelets: 163 10*3/uL (ref 150–400)
RBC: 3.51 MIL/uL — ABNORMAL LOW (ref 4.22–5.81)
RDW: 14.2 % (ref 11.5–15.5)
WBC: 4.9 10*3/uL (ref 4.0–10.5)
nRBC: 0 % (ref 0.0–0.2)

## 2022-10-05 LAB — URINALYSIS, COMPLETE (UACMP) WITH MICROSCOPIC
Bacteria, UA: NONE SEEN
Bilirubin Urine: NEGATIVE
Glucose, UA: NEGATIVE mg/dL
Hgb urine dipstick: NEGATIVE
Ketones, ur: NEGATIVE mg/dL
Leukocytes,Ua: NEGATIVE
Nitrite: NEGATIVE
Protein, ur: 30 mg/dL — AB
Specific Gravity, Urine: 1.021 (ref 1.005–1.030)
pH: 5 (ref 5.0–8.0)

## 2022-10-05 LAB — COMPREHENSIVE METABOLIC PANEL
ALT: 26 U/L (ref 0–44)
AST: 20 U/L (ref 15–41)
Albumin: 3.4 g/dL — ABNORMAL LOW (ref 3.5–5.0)
Alkaline Phosphatase: 66 U/L (ref 38–126)
Anion gap: 7 (ref 5–15)
BUN: 40 mg/dL — ABNORMAL HIGH (ref 8–23)
CO2: 25 mmol/L (ref 22–32)
Calcium: 8.2 mg/dL — ABNORMAL LOW (ref 8.9–10.3)
Chloride: 105 mmol/L (ref 98–111)
Creatinine, Ser: 2.51 mg/dL — ABNORMAL HIGH (ref 0.61–1.24)
GFR, Estimated: 25 mL/min — ABNORMAL LOW (ref >60)
Glucose, Bld: 108 mg/dL — ABNORMAL HIGH (ref 70–99)
Potassium: 4.3 mmol/L (ref 3.5–5.1)
Sodium: 137 mmol/L (ref 135–145)
Total Bilirubin: 0.5 mg/dL (ref 0.3–1.2)
Total Protein: 5.9 g/dL — ABNORMAL LOW (ref 6.5–8.1)

## 2022-10-05 LAB — PSA: Prostatic Specific Antigen: 7.22 ng/mL — ABNORMAL HIGH (ref 0.00–4.00)

## 2022-10-05 MED ORDER — BORTEZOMIB CHEMO SQ INJECTION 3.5 MG (2.5MG/ML)
1.3000 mg/m2 | Freq: Once | INTRAMUSCULAR | Status: AC
  Administered 2022-10-05: 2.25 mg via SUBCUTANEOUS
  Filled 2022-10-05: qty 0.9

## 2022-10-05 MED ORDER — PROCHLORPERAZINE MALEATE 10 MG PO TABS
10.0000 mg | ORAL_TABLET | Freq: Four times a day (QID) | ORAL | Status: DC | PRN
  Administered 2022-10-05: 10 mg via ORAL
  Filled 2022-10-05: qty 1

## 2022-10-05 MED ORDER — DEXAMETHASONE 4 MG PO TABS
20.0000 mg | ORAL_TABLET | Freq: Once | ORAL | Status: AC
  Administered 2022-10-05: 20 mg via ORAL
  Filled 2022-10-05: qty 5

## 2022-10-05 MED ORDER — DIPHENHYDRAMINE HCL 25 MG PO CAPS
50.0000 mg | ORAL_CAPSULE | Freq: Once | ORAL | Status: AC
  Administered 2022-10-05: 50 mg via ORAL
  Filled 2022-10-05: qty 2

## 2022-10-05 NOTE — Progress Notes (Signed)
MD wants compazine, dex and benadryl with velcade tx - patient developed rash with velcade.

## 2022-10-05 NOTE — Patient Instructions (Signed)
[facility] [place]  Discharge Instructions: Thank you for choosing [facility] to provide your oncology and hematology care.  If you have a lab appointment with the Cancer Center, please go directly to the Cancer Center and check in at the registration area.  Wear comfortable clothing and clothing appropriate for easy access to any Portacath or PICC line.   We strive to give you quality time with your provider. You may need to reschedule your appointment if you arrive late (15 or more minutes).  Arriving late affects you and other patients whose appointments are after yours.  Also, if you miss three or more appointments without notifying the office, you may be dismissed from the clinic at the provider's discretion.      For prescription refill requests, have your pharmacy contact our office and allow 72 hours for refills to be completed.    Today you received the following chemotherapy and/or immunotherapy agents Velcade       To help prevent nausea and vomiting after your treatment, we encourage you to take your nausea medication as directed.  BELOW ARE SYMPTOMS THAT SHOULD BE REPORTED IMMEDIATELY: *FEVER GREATER THAN 100.4 F (38 C) OR HIGHER *CHILLS OR SWEATING *NAUSEA AND VOMITING THAT IS NOT CONTROLLED WITH YOUR NAUSEA MEDICATION *UNUSUAL SHORTNESS OF BREATH *UNUSUAL BRUISING OR BLEEDING *URINARY PROBLEMS (pain or burning when urinating, or frequent urination) *BOWEL PROBLEMS (unusual diarrhea, constipation, pain near the anus) TENDERNESS IN MOUTH AND THROAT WITH OR WITHOUT PRESENCE OF ULCERS (sore throat, sores in mouth, or a toothache) UNUSUAL RASH, SWELLING OR PAIN  UNUSUAL VAGINAL DISCHARGE OR ITCHING   Items with * indicate a potential emergency and should be followed up as soon as possible or go to the Emergency Department if any problems should occur.  Please show the CHEMOTHERAPY ALERT CARD or IMMUNOTHERAPY ALERT CARD at check-in to  the Emergency Department and triage nurse.  Should you have questions after your visit or need to cancel or reschedule your appointment, please contact [facility] [place]  [phone] and follow the prompts.  Office hours are 8:00 a.m. to 4:30 p.m. Monday - Friday. Please note that voicemails left after 4:00 p.m. may not be returned until the following business day.  We are closed weekends and major holidays. You have access to a nurse at all times for urgent questions. Please call the main number to the clinic [phone] and follow the prompts.  For any non-urgent questions, you may also contact your provider using MyChart. We now offer e-Visits for anyone 18 and older to request care online for non-urgent symptoms. For details visit [email or web address].   Also download the MyChart app! Go to the app store, search "MyChart", open the app, select [place], and log in with your MyChart username and password.  Masks are optional in the cancer centers. If you would like for your care team to wear a mask while they are taking care of you, please let them know. For doctor visits, patients may have with them one support person who is at least 83 years old. At this time, visitors are not allowed in the infusion area.   Bortezomib Injection What is this medication? BORTEZOMIB (bor TEZ oh mib) treats lymphoma. It may also be used to treat multiple myeloma, a type of bone marrow cancer. It works by blocking a protein that causes cancer cells to grow and multiply. This helps to slow or stop the spread of cancer cells. This   be used for other purposes; ask your health care provider or pharmacist if you have questions. COMMON BRAND NAME(S): Velcade What should I tell my care team before I take this medication? They need to know if you have any of these conditions: Dehydration Diabetes Heart disease Liver disease Tingling of the fingers or toes or other nerve  disorder An unusual or allergic reaction to bortezomib, other medications, foods, dyes, or preservatives If you or your partner are pregnant or trying to get pregnant Breastfeeding How should I use this medication? This medication is injected into a vein or under the skin. It is given by your care team in a hospital or clinic setting. Talk to your care team about the use of this medication in children. Special care may be needed. Overdosage: If you think you have taken too much of this medicine contact a poison control center or emergency room at once. NOTE: This medicine is only for you. Do not share this medicine with others. What if I miss a dose? Keep appointments for follow-up doses. It is important not to miss your dose. Call your care team if you are unable to keep an appointment. What may interact with this medication? Ketoconazole Rifampin This list may not describe all possible interactions. Give your health care provider a list of all the medicines, herbs, non-prescription drugs, or dietary supplements you use. Also tell them if you smoke, drink alcohol, or use illegal drugs. Some items may interact with your medicine. What should I watch for while using this medication? Your condition will be monitored carefully while you are receiving this medication. You may need blood work while taking this medication. This medication may affect your coordination, reaction time, or judgment. Do not drive or operate machinery until you know how this medication affects you. Sit up or stand slowly to reduce the risk of dizzy or fainting spells. Drinking alcohol with this medication can increase the risk of these side effects. This medication may increase your risk of getting an infection. Call your care team for advice if you get a fever, chills, sore throat, or other symptoms of a cold or flu. Do not treat yourself. Try to avoid being around people who are sick. Check with your care team if you have  severe diarrhea, nausea, and vomiting, or if you sweat a lot. The loss of too much body fluid may make it dangerous for you to take this medication. Talk to your care team if you may be pregnant. Serious birth defects can occur if you take this medication during pregnancy and for 7 months after the last dose. You will need a negative pregnancy test before starting this medication. Contraception is recommended while taking this medication and for 7 months after the last dose. Your care team can help you find the option that works for you. If your partner can get pregnant, use a condom during sex while taking this medication and for 4 months after the last dose. Do not breastfeed while taking this medication and for 2 months after the last dose. This medication may cause infertility. Talk to your care team if you are concerned about your fertility. What side effects may I notice from receiving this medication? Side effects that you should report to your care team as soon as possible: Allergic reactions--skin rash, itching, hives, swelling of the face, lips, tongue, or throat Bleeding--bloody or black, tar-like stools, vomiting blood or brown material that looks like coffee grounds, red or dark brown urine, small   red or purple spots on skin, unusual bruising or bleeding Bleeding in the brain--severe headache, stiff neck, confusion, dizziness, change in vision, numbness or weakness of the face, arm, or leg, trouble speaking, trouble walking, vomiting Bowel blockage--stomach cramping, unable to have a bowel movement or pass gas, loss of appetite, vomiting Heart failure--shortness of breath, swelling of the ankles, feet, or hands, sudden weight gain, unusual weakness or fatigue Infection--fever, chills, cough, sore throat, wounds that don't heal, pain or trouble when passing urine, general feeling of discomfort or being unwell Liver injury--right upper belly pain, loss of appetite, nausea, light-colored  stool, dark yellow or brown urine, yellowing skin or eyes, unusual weakness or fatigue Low blood pressure--dizziness, feeling faint or lightheaded, blurry vision Lung injury--shortness of breath or trouble breathing, cough, spitting up blood, chest pain, fever Pain, tingling, or numbness in the hands or feet Severe or prolonged diarrhea Stomach pain, bloody diarrhea, pale skin, unusual weakness or fatigue, decrease in the amount of urine, which may be signs of hemolytic uremic syndrome Sudden and severe headache, confusion, change in vision, seizures, which may be signs of posterior reversible encephalopathy syndrome (PRES) TTP--purple spots on the skin or inside the mouth, pale skin, yellowing skin or eyes, unusual weakness or fatigue, fever, fast or irregular heartbeat, confusion, change in vision, trouble speaking, trouble walking Tumor lysis syndrome (TLS)--nausea, vomiting, diarrhea, decrease in the amount of urine, dark urine, unusual weakness or fatigue, confusion, muscle pain or cramps, fast or irregular heartbeat, joint pain Side effects that usually do not require medical attention (report to your care team if they continue or are bothersome): Constipation Diarrhea Fatigue Loss of appetite Nausea This list may not describe all possible side effects. Call your doctor for medical advice about side effects. You may report side effects to FDA at 1-800-FDA-1088. Where should I keep my medication? This medication is given in a hospital or clinic. It will not be stored at home. NOTE: This sheet is a summary. It may not cover all possible information. If you have questions about this medicine, talk to your doctor, pharmacist, or health care provider.  2023 Elsevier/Gold Standard (2022-04-19 00:00:00)

## 2022-10-06 ENCOUNTER — Ambulatory Visit
Admission: RE | Admit: 2022-10-06 | Discharge: 2022-10-06 | Disposition: A | Discharge status: Home / Self Care | Payer: Medicare Other | Source: Ambulatory Visit | Attending: Oncology | Admitting: Oncology

## 2022-10-06 VITALS — BP 112/46 | HR 47 | Temp 98.4°F | Resp 14 | Ht 67.0 in | Wt 151.0 lb

## 2022-10-06 DIAGNOSIS — N183 Chronic kidney disease, stage 3 unspecified: Secondary | ICD-10-CM | POA: Insufficient documentation

## 2022-10-06 DIAGNOSIS — Z8579 Personal history of other malignant neoplasms of lymphoid, hematopoietic and related tissues: Secondary | ICD-10-CM | POA: Diagnosis not present

## 2022-10-06 DIAGNOSIS — C61 Malignant neoplasm of prostate: Secondary | ICD-10-CM | POA: Diagnosis not present

## 2022-10-06 DIAGNOSIS — Z8546 Personal history of malignant neoplasm of prostate: Secondary | ICD-10-CM | POA: Diagnosis present

## 2022-10-06 DIAGNOSIS — Z01818 Encounter for other preprocedural examination: Secondary | ICD-10-CM

## 2022-10-06 DIAGNOSIS — K769 Liver disease, unspecified: Secondary | ICD-10-CM

## 2022-10-06 DIAGNOSIS — C669 Malignant neoplasm of unspecified ureter: Secondary | ICD-10-CM | POA: Diagnosis not present

## 2022-10-06 DIAGNOSIS — C787 Secondary malignant neoplasm of liver and intrahepatic bile duct: Secondary | ICD-10-CM | POA: Diagnosis not present

## 2022-10-06 DIAGNOSIS — D631 Anemia in chronic kidney disease: Secondary | ICD-10-CM

## 2022-10-06 LAB — CBC WITH DIFFERENTIAL/PLATELET
Abs Immature Granulocytes: 0.02 10*3/uL (ref 0.00–0.07)
Basophils Absolute: 0 10*3/uL (ref 0.0–0.1)
Basophils Relative: 0 %
Eosinophils Absolute: 0 10*3/uL (ref 0.0–0.5)
Eosinophils Relative: 0 %
HCT: 33.1 % — ABNORMAL LOW (ref 39.0–52.0)
Hemoglobin: 11.1 g/dL — ABNORMAL LOW (ref 13.0–17.0)
Immature Granulocytes: 0 %
Lymphocytes Relative: 8 %
Lymphs Abs: 0.6 10*3/uL — ABNORMAL LOW (ref 0.7–4.0)
MCH: 30.9 pg (ref 26.0–34.0)
MCHC: 33.5 g/dL (ref 30.0–36.0)
MCV: 92.2 fL (ref 80.0–100.0)
Monocytes Absolute: 1.7 10*3/uL — ABNORMAL HIGH (ref 0.1–1.0)
Monocytes Relative: 22 %
Neutro Abs: 5.3 10*3/uL (ref 1.7–7.7)
Neutrophils Relative %: 70 %
Platelets: 183 10*3/uL (ref 150–400)
RBC: 3.59 MIL/uL — ABNORMAL LOW (ref 4.22–5.81)
RDW: 14.1 % (ref 11.5–15.5)
WBC: 7.7 10*3/uL (ref 4.0–10.5)
nRBC: 0 % (ref 0.0–0.2)

## 2022-10-06 LAB — KAPPA/LAMBDA LIGHT CHAINS
Kappa free light chain: 95.9 mg/L — ABNORMAL HIGH (ref 3.3–19.4)
Kappa, lambda light chain ratio: 8.49 — ABNORMAL HIGH (ref 0.26–1.65)
Lambda free light chains: 11.3 mg/L (ref 5.7–26.3)

## 2022-10-06 LAB — PROTIME-INR
INR: 1.1 (ref 0.8–1.2)
Prothrombin Time: 13.7 seconds (ref 11.4–15.2)

## 2022-10-06 MED ORDER — SODIUM CHLORIDE 0.9 % IV SOLN: INTRAVENOUS | Status: DC

## 2022-10-06 MED ORDER — MIDAZOLAM HCL 2 MG/2ML IJ SOLN
INTRAMUSCULAR | Status: AC
  Filled 2022-10-06: qty 2

## 2022-10-06 MED ORDER — FENTANYL CITRATE (PF) 100 MCG/2ML IJ SOLN
INTRAMUSCULAR | Status: AC
  Filled 2022-10-06: qty 2

## 2022-10-06 MED ORDER — FENTANYL CITRATE (PF) 100 MCG/2ML IJ SOLN
INTRAMUSCULAR | Status: AC | PRN
  Administered 2022-10-06 (×2): 25 ug via INTRAVENOUS

## 2022-10-06 MED ORDER — MIDAZOLAM HCL 2 MG/2ML IJ SOLN
INTRAMUSCULAR | Status: AC | PRN
  Administered 2022-10-06 (×2): .5 mg via INTRAVENOUS

## 2022-10-06 MED ORDER — LIDOCAINE HCL (PF) 1 % IJ SOLN
10.0000 mL | Freq: Once | INTRAMUSCULAR | Status: AC
  Administered 2022-10-06: 10 mL via INTRADERMAL

## 2022-10-06 NOTE — Progress Notes (Signed)
Patient clinically stable post Liver biopsy per Dr Annamaria Boots, tolerated well. Vitals stable pre and post procedure. Denies complaints at present time. Report given to [facility] post procedure at bedside. Received Versed 1 mg along with Fentanyl 50 mcg IV for procedure. Wife at bedside post procedure with update given.

## 2022-10-06 NOTE — H&P (Signed)
Chief Complaint: Patient was seen in consultation today for liver lesion  Referring Physician(s): Yu,Zhou  Supervising Physician: Daryll Brod  Patient Status: [facility] - Out-pt  History of Present Illness: [name] [name] is a 81 y.o. male with PMH significant for anemia, Barrett's esophagus, CKD Stage III, urothelial carcinoma of distal ureter, and multiple myeloma. Patient is known to IR from prior bone marrow biopsy in April 2023. Recent PET scan at [facility] revealed concern for new liver lesions. Patient was referred to IR for image-guided biopsy of new liver lesion.  Past Medical History:  Diagnosis Date   Age related osteoporosis    Anemia    Barrett's esophagus    Bradycardia    Cancer (HCC)    PROSTATE   Cataract    CKD (chronic kidney disease) stage 3, GFR 30-59 ml/min (HCC)    Colon polyps    DDD (degenerative disc disease), cervical    DDD (degenerative disc disease), lumbar    Femur fracture (HCC)    Right   Fundic gland polyposis of stomach    Fundic gland polyps of stomach, benign    Gastritis    GERD (gastroesophageal reflux disease)    Hematuria    Hemorrhage of rectum and anus    History of colon polyps    Insomnia    Lumbago    Osteopenia of the elderly    Skin cancer    Sleep apnea     Past Surgical History:  Procedure Laterality Date   BAND HEMORRHOIDECTOMY     COLONOSCOPY     COLONOSCOPY WITH PROPOFOL N/A 05/06/2018   Procedure: COLONOSCOPY WITH PROPOFOL;  Surgeon: Lollie Sails, MD;  Location: [facility] ENDOSCOPY;  Service: Endoscopy;  Laterality: N/A;   COLONOSCOPY WITH PROPOFOL N/A 09/23/2018   Procedure: COLONOSCOPY WITH PROPOFOL;  Surgeon: Lollie Sails, MD;  Location: [facility] ENDOSCOPY;  Service: Endoscopy;  Laterality: N/A;   COLONOSCOPY WITH PROPOFOL N/A 03/30/2021   Procedure: COLONOSCOPY WITH PROPOFOL;  Surgeon: Toledo, Benay Pike, MD;  Location: [facility] ENDOSCOPY;  Service: Gastroenterology;  Laterality: N/A;   CYSTOSCOPY W/ URETERAL  STENT PLACEMENT Right 03/14/2022   Procedure: CYSTOSCOPY WITH RETROGRADE PYELOGRAM/URETERAL STENT PLACEMENT;  Surgeon: Abbie Sons, MD;  Location: [facility] ORS;  Service: Urology;  Laterality: Right;   ESOPHAGOGASTRODUODENOSCOPY (EGD) WITH PROPOFOL N/A 10/03/2016   Procedure: ESOPHAGOGASTRODUODENOSCOPY (EGD) WITH PROPOFOL;  Surgeon: Lollie Sails, MD;  Location: [facility] ENDOSCOPY;  Service: Endoscopy;  Laterality: N/A;   EYE SURGERY     CATARACTS   EYELID SURGERY     FLEXIBLE SIGMOIDOSCOPY     FRACTURE SURGERY Right 2016   femur   FRACTURE SURGERY     ORIF DISTAL FEMUR FRACTURE     TONSILLECTOMY     TRANSURETHRAL RESECTION OF BLADDER TUMOR N/A 03/14/2022   Procedure: TRANSURETHRAL RESECTION OF BLADDER TUMOR (TURBT);  Surgeon: Abbie Sons, MD;  Location: [facility] ORS;  Service: Urology;  Laterality: N/A;    Allergies: Demerol [meperidine hcl], Gabapentin, Prolia [denosumab], Demerol [meperidine], and Nsaids  Medications: Prior to Admission medications   Medication Sig Start Date End Date Taking? Authorizing Provider  acyclovir (ZOVIRAX) 200 MG capsule Take 1 capsule (200 mg total) by mouth 2 (two) times daily. 07/19/22  Yes [name], MD  aspirin EC 81 MG tablet Take 1 tablet (81 mg total) by mouth daily. Swallow whole. 04/10/22  Yes [name], MD  Calcium Carbonate (CALCIUM 500 PO) Take 1 tablet by mouth daily.   Yes [provider]  Cholecalciferol (VITAMIN D3) 125 MCG (5000 UT) CAPS Take 5,000 Units by mouth daily.   Yes [provider]  ferrous sulfate 325 (65 FE) MG tablet Take 325 mg by mouth daily.   Yes [provider]  ipratropium (ATROVENT) 0.03 % nasal spray Place 1 spray into both nostrils in the morning. 02/13/18  Yes [provider]  lenalidomide (REVLIMID) 5 MG capsule Take 1 capsule (5 mg total) by mouth daily. Take for 21 days, then hold for 7 days. Repeat every 28 days. 09/13/22  Yes [name], MD  losartan (COZAAR) 25 MG tablet Take 25 mg  by mouth daily.   Yes [provider]  melatonin 5 MG TABS Take 5 mg by mouth at bedtime.   Yes [provider]  Multiple Vitamins-Minerals (MENS 50+ MULTIVITAMIN) TABS Take 1 tablet by mouth daily.   Yes [provider]  predniSONE (STERAPRED UNI-PAK 21 TAB) 10 MG (21) TBPK tablet Day 1 take 6 tablets, Day 2 take 5 tablets, Day 3 take 4 tablets, Day 4 take 3 tablets, Day 5 take 2 tablets D6 take 1 tablet 09/26/22  Yes [name], MD  senna (SENOKOT) 8.6 MG TABS tablet Take 2 tablets (17.2 mg total) by mouth daily. 06/21/22  Yes [name], MD  traZODone (DESYREL) 50 MG tablet Take 25 mg by mouth at bedtime.   Yes [provider]  gabapentin (NEURONTIN) 100 MG capsule Take 1 capsule (100 mg total) by mouth 2 (two) times daily. Start once daily at bedtime, after 3 days, you may increase to twice daily. Avoid driving after taking medication. Patient not taking: Reported on 09/26/2022 09/19/22   [name], MD  montelukast (SINGULAIR) 10 MG tablet Take 1 tablet (10 mg total) by mouth See admin instructions. Take 1 tablet the day prior to Daratumumab treatments, and take 1 tablet daily for 2 days after treatments 03/20/22   [name], MD  trospium (SANCTURA) 20 MG tablet Take by mouth. Take 1 tablet (20 mg total) by mouth daily as needed (as needed for urinary urgency). Patient not taking: Reported on 09/26/2022 09/20/22   [provider]     Family History  Problem Relation Age of Onset   Breast cancer Mother     Social History   Socioeconomic History   Marital status: Married    Spouse name: Not on file   Number of children: Not on file   Years of education: Not on file   Highest education level: Not on file  Occupational History   Not on file  Tobacco Use   Smoking status: Former    Packs/day: 1.00    Years: 15.00    Total pack years: 15.00    Types: Cigarettes    Quit date: 10/03/1974    Years since quitting: 48.0   Smokeless tobacco: Never   Vaping Use   Vaping Use: Never used  Substance and Sexual Activity   Alcohol use: Not Currently    Alcohol/week: 1.0 standard drink of alcohol    Types: 1 Cans of beer per week    Comment: 1 beer every 2 weeks   Drug use: No   Sexual activity: Yes    Birth control/protection: None  Other Topics Concern   Not on file  Social History Narrative   ** Merged History Encounter **       Social Determinants of Health   Financial Resource Strain: Not on file  Food Insecurity: Not on file  Transportation Needs: Not  on file  Physical Activity: Not on file  Stress: Not on file  Social Connections: Not on file    Review of Systems: A 12 point ROS discussed and pertinent positives are indicated in the HPI above.  All other systems are negative.  Review of Systems  Constitutional:  Negative for chills and fatigue.  Respiratory:  Negative for chest tightness and shortness of breath.   Cardiovascular:  Negative for chest pain and leg swelling.  Gastrointestinal:  Negative for diarrhea, nausea and vomiting.  Neurological:  Negative for dizziness and headaches.  Psychiatric/Behavioral:  Negative for confusion.     Vital Signs: BP (!) 145/53   Pulse (!) 44   Temp 98.4 F (36.9 C) (Oral)   Resp 14   Ht _0  (1.702 m)   Wt 151 lb 0.2 oz (68.5 kg)   SpO2 100%   BMI 23.65 kg/m    Physical Exam Vitals reviewed.  Constitutional:      General: He is not in acute distress.    Appearance: Normal appearance. He is normal weight.  HENT:     Mouth/Throat:     Mouth: Mucous membranes are moist.  Cardiovascular:     Rate and Rhythm: Regular rhythm. Bradycardia present.     Pulses: Normal pulses.     Heart sounds: Normal heart sounds.  Abdominal:     General: Abdomen is flat. Bowel sounds are normal.     Palpations: Abdomen is soft.     Tenderness: There is no abdominal tenderness.  Musculoskeletal:     Right lower leg: No edema.     Left lower leg: No edema.  Skin:    General:  Skin is warm and dry.  Neurological:     Mental Status: He is alert and oriented to person, place, and time.  Psychiatric:        Mood and Affect: Mood normal.        Behavior: Behavior normal.        Thought Content: Thought content normal.        Judgment: Judgment normal.     Imaging: US Venous Img Upper Uni Right  Result Date: 09/22/2022 CLINICAL DATA:  81 year old male with a history of facial swelling EXAM: RIGHT UPPER EXTREMITY VENOUS DOPPLER ULTRASOUND TECHNIQUE: Gray-scale sonography with graded compression, as well as color Doppler and duplex ultrasound were performed to evaluate the upper extremity deep venous system from the level of the subclavian vein and including the jugular, axillary, basilic, radial, ulnar and upper cephalic vein. Spectral Doppler was utilized to evaluate flow at rest and with distal augmentation maneuvers. COMPARISON:  None Available. FINDINGS: Contralateral Subclavian Vein: Respiratory phasicity is normal and symmetric with the symptomatic side. No evidence of thrombus. Normal compressibility. Internal Jugular Vein: No evidence of thrombus. Normal compressibility, respiratory phasicity and response to augmentation. Subclavian Vein: No evidence of thrombus. Normal compressibility, respiratory phasicity and response to augmentation. Axillary Vein: No evidence of thrombus. Normal compressibility, respiratory phasicity and response to augmentation. Cephalic Vein: No evidence of thrombus. Normal compressibility, respiratory phasicity and response to augmentation. Basilic Vein: No evidence of thrombus. Normal compressibility, respiratory phasicity and response to augmentation. Brachial Veins: No evidence of thrombus. Normal compressibility, respiratory phasicity and response to augmentation. Radial Veins: No evidence of thrombus. Normal compressibility, respiratory phasicity and response to augmentation. Ulnar Veins: No evidence of thrombus. Normal compressibility,  respiratory phasicity and response to augmentation. Other Findings:  None visualized. IMPRESSION: Directed duplex of the right upper extremity negative for  DVT Signed, Dulcy Fanny. Nadene Rubins, RPVI [facility] Radiology Electronically Signed   By: Corrie Mckusick D.O.   On: 09/22/2022 16:35   MR Lumbar Spine Wo Contrast  Result Date: 09/21/2022 CLINICAL DATA:  Low back pain. Multiple myeloma. Increased fracture risk. Pain extends into the left lower extremity. EXAM: MRI LUMBAR SPINE WITHOUT CONTRAST TECHNIQUE: Multiplanar, multisequence MR imaging of the lumbar spine was performed. No intravenous contrast was administered. COMPARISON:  CT of the abdomen and pelvis 02/07/2019. Whole-body bone scan 02/07/2019. FINDINGS: Segmentation: 5 non rib-bearing lumbar type vertebral bodies are present. The lowest fully formed vertebral body is L5. Alignment: No significant listhesis is present. Lumbar lordosis is preserved. Vertebrae: A T2 hyperintense lesion along the inferior aspect of the S1 vertebral body measures up to 12 mm. No other discrete marrow lesions are present. Vertebral body heights are normal. Conus medullaris and cauda equina: Conus extends to the T12-L1 level. Conus is within normal limits. A 6 mm nodule is present in the cauda equina at the L4-5 level. Nerve roots are otherwise within normal limits. Paraspinal and other soft tissues: Benign cysts of the liver are again noted. A simple cyst in the posterior aspect of the left kidney is stable measuring 27 mm. No other discrete solid organ lesions are present. No significant adenopathy is present. Disc levels: T12-L1: Insert normal disc level L1-2: Insert normal disc level L2-3: Mild disc desiccation is present. No significant herniation or stenosis is present. L3-4: A mild broad-based disc bulge is present. Disc extends into the foramina bilaterally. Mild bilateral foraminal stenosis is present. Central  canal is patent. L4-5: Mild lateral disc bulging is present. Facet hypertrophy and short pedicles contribute to mild bilateral foraminal stenosis. L5-S1: A broad-based disc protrusion is present. Left paramedian annular tear is present near the left S1 nerve roots. Disc material extends into the foramina bilaterally without significant stenosis. IMPRESSION: 1. 6 mm nodule in the cauda equina at the L4-5 level. 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 Recommend MRI of the lumbar spine with contrast for further evaluation. 2. 12 mm T2 hyperintense lesion along the inferior aspect of the S1 vertebral body. This may represent focal involvement of multiple myeloma. No other discrete lesions are present. No pathologic fracture is present. 3. Mild bilateral foraminal stenosis at L3-4 and L4-5. 4. Left paramedian annular tear at L5-S1 near the left S1 nerve roots. Electronically Signed   By: San Morelle M.D.   On: 09/21/2022 10:11    Labs:  CBC: Recent Labs    09/21/22 1244 09/28/22 0931 10/05/22 1236 10/06/22 0853  WBC 6.7 11.3* 4.9 7.7  HGB 10.8* 10.5* 10.9* 11.1*  HCT 33.7* 32.6* 34.1* 33.1*  PLT 183 175 163 183    COAGS: Recent Labs    03/01/22 0805 10/06/22 0854  INR 1.1 1.1    BMP: Recent Labs    09/14/22 0849 09/21/22 1244 09/28/22 0931 10/05/22 1236  NA 141 142 139 137  K 4.4 4.9 4.3 4.3  CL 111 108 107 105  CO2 _0 GLUCOSE 78 118* 125* 108*  BUN 41* 34* 59* 40*  CALCIUM 8.6* 8.7* 9.1 8.2*  CREATININE 2.42* 2.33* 2.71* 2.51*  GFRNONAA 26* 27* 23* 25*    LIVER FUNCTION TESTS: Recent Labs    09/14/22 0849 09/21/22 1244 09/28/22 0931 10/05/22 1236  BILITOT 0.5 0.4 0.5 0.5  AST _1 ALT  _0 ALKPHOS 65 57 67 66  PROT 5.8* 5.7* 5.7* 5.9*  ALBUMIN 3.3* 3.2* 3.4* 3.4*    TUMOR MARKERS: No results for input(s): "AFPTM", "CEA", "CA199", "CHROMGRNA" in the last 8760 hours.  Assessment and  Plan:  [name] [name] is an 81 yo male with PMH significant for anemia, Barrett's esophagus, CKD Stage III, urothelial carcinoma of distal ureter, and multiple myeloma being seen today in relation to new liver lesions. The patient was referred to IR for image-guided liver biopsy. The case was reviewed and approved by Dr Maryelizabeth Kaufmann. Case is set to proceed on 10/06/22 with Dr Annamaria Boots.  Risks and benefits of image-guided liver biopsy was discussed with the patient including, but not limited to bleeding, infection, damage to adjacent structures or low yield requiring additional tests.  All of the questions were answered and there is agreement to proceed.  Consent signed and in chart.   Thank you for this interesting consult.  I greatly enjoyed meeting [name] [name] and look forward to participating in their care.  A copy of this report was sent to the requesting provider on this date.  Electronically Signed: Lura Em, PA-C 10/06/2022, 9:41 AM   I spent a total of    15 Minutes in face to face in clinical consultation, greater than 50% of which was counseling/coordinating care for liver lesions.

## 2022-10-06 NOTE — Procedures (Signed)
Interventional Radiology Procedure Note  Procedure: [place] RT LIVER LESION CORE BX    Complications: None  Estimated Blood Loss:  MIN  Findings: 75 G CORE X 2    M. Daryll Brod, MD

## 2022-10-09 LAB — MULTIPLE MYELOMA PANEL, SERUM
Albumin SerPl Elph-Mcnc: 3.3 g/dL (ref 2.9–4.4)
Albumin/Glob SerPl: 1.8 — ABNORMAL HIGH (ref 0.7–1.7)
Alpha 1: 0.2 g/dL (ref 0.0–0.4)
Alpha2 Glob SerPl Elph-Mcnc: 0.6 g/dL (ref 0.4–1.0)
B-Globulin SerPl Elph-Mcnc: 0.7 g/dL (ref 0.7–1.3)
Gamma Glob SerPl Elph-Mcnc: 0.4 g/dL (ref 0.4–1.8)
Globulin, Total: 1.9 g/dL — ABNORMAL LOW (ref 2.2–3.9)
IgA: 255 mg/dL (ref 61–437)
IgG (Immunoglobin G), Serum: 435 mg/dL — ABNORMAL LOW (ref 603–1613)
IgM (Immunoglobulin M), Srm: 41 mg/dL (ref 15–143)
M Protein SerPl Elph-Mcnc: 0.2 g/dL — ABNORMAL HIGH
Total Protein ELP: 5.2 g/dL — ABNORMAL LOW (ref 6.0–8.5)

## 2022-10-10 ENCOUNTER — Other Ambulatory Visit: Payer: Self-pay | Admitting: Pathology

## 2022-10-10 LAB — SURGICAL PATHOLOGY

## 2022-10-11 ENCOUNTER — Ambulatory Visit: Payer: Medicare Other | Admitting: Oncology

## 2022-10-11 ENCOUNTER — Ambulatory Visit: Payer: Medicare Other

## 2022-10-11 ENCOUNTER — Other Ambulatory Visit: Payer: Medicare Other

## 2022-10-12 ENCOUNTER — Inpatient Hospital Stay: Payer: Medicare Other

## 2022-10-12 ENCOUNTER — Telehealth: Payer: Self-pay | Admitting: Oncology

## 2022-10-12 ENCOUNTER — Encounter: Payer: Self-pay | Admitting: Oncology

## 2022-10-12 ENCOUNTER — Inpatient Hospital Stay (HOSPITAL_BASED_OUTPATIENT_CLINIC_OR_DEPARTMENT_OTHER): Payer: Medicare Other | Admitting: Oncology

## 2022-10-12 VITALS — BP 125/54 | HR 50 | Temp 97.4°F | Wt 145.1 lb

## 2022-10-12 DIAGNOSIS — C61 Malignant neoplasm of prostate: Secondary | ICD-10-CM

## 2022-10-12 DIAGNOSIS — C9000 Multiple myeloma not having achieved remission: Secondary | ICD-10-CM | POA: Diagnosis not present

## 2022-10-12 DIAGNOSIS — Z5112 Encounter for antineoplastic immunotherapy: Secondary | ICD-10-CM | POA: Diagnosis not present

## 2022-10-12 DIAGNOSIS — N184 Chronic kidney disease, stage 4 (severe): Secondary | ICD-10-CM

## 2022-10-12 DIAGNOSIS — M549 Dorsalgia, unspecified: Secondary | ICD-10-CM | POA: Insufficient documentation

## 2022-10-12 DIAGNOSIS — C669 Malignant neoplasm of unspecified ureter: Secondary | ICD-10-CM

## 2022-10-12 DIAGNOSIS — M5442 Lumbago with sciatica, left side: Secondary | ICD-10-CM

## 2022-10-12 DIAGNOSIS — R21 Rash and other nonspecific skin eruption: Secondary | ICD-10-CM

## 2022-10-12 DIAGNOSIS — C787 Secondary malignant neoplasm of liver and intrahepatic bile duct: Secondary | ICD-10-CM

## 2022-10-12 DIAGNOSIS — Z8546 Personal history of malignant neoplasm of prostate: Secondary | ICD-10-CM

## 2022-10-12 DIAGNOSIS — Z5111 Encounter for antineoplastic chemotherapy: Secondary | ICD-10-CM

## 2022-10-12 DIAGNOSIS — G8929 Other chronic pain: Secondary | ICD-10-CM

## 2022-10-12 LAB — CBC WITH DIFFERENTIAL/PLATELET
Abs Immature Granulocytes: 0.01 10*3/uL (ref 0.00–0.07)
Basophils Absolute: 0.1 10*3/uL (ref 0.0–0.1)
Basophils Relative: 1 %
Eosinophils Absolute: 1.7 10*3/uL — ABNORMAL HIGH (ref 0.0–0.5)
Eosinophils Relative: 29 %
HCT: 33.1 % — ABNORMAL LOW (ref 39.0–52.0)
Hemoglobin: 10.7 g/dL — ABNORMAL LOW (ref 13.0–17.0)
Immature Granulocytes: 0 %
Lymphocytes Relative: 8 %
Lymphs Abs: 0.5 10*3/uL — ABNORMAL LOW (ref 0.7–4.0)
MCH: 31.4 pg (ref 26.0–34.0)
MCHC: 32.3 g/dL (ref 30.0–36.0)
MCV: 97.1 fL (ref 80.0–100.0)
Monocytes Absolute: 0.6 10*3/uL (ref 0.1–1.0)
Monocytes Relative: 11 %
Neutro Abs: 3.2 10*3/uL (ref 1.7–7.7)
Neutrophils Relative %: 51 %
Platelets: 171 10*3/uL (ref 150–400)
RBC: 3.41 MIL/uL — ABNORMAL LOW (ref 4.22–5.81)
RDW: 14.4 % (ref 11.5–15.5)
WBC: 6 10*3/uL (ref 4.0–10.5)
nRBC: 0 % (ref 0.0–0.2)

## 2022-10-12 LAB — COMPREHENSIVE METABOLIC PANEL
ALT: 19 U/L (ref 0–44)
AST: 21 U/L (ref 15–41)
Albumin: 3.4 g/dL — ABNORMAL LOW (ref 3.5–5.0)
Alkaline Phosphatase: 61 U/L (ref 38–126)
Anion gap: 7 (ref 5–15)
BUN: 48 mg/dL — ABNORMAL HIGH (ref 8–23)
CO2: 24 mmol/L (ref 22–32)
Calcium: 8.7 mg/dL — ABNORMAL LOW (ref 8.9–10.3)
Chloride: 107 mmol/L (ref 98–111)
Creatinine, Ser: 2.96 mg/dL — ABNORMAL HIGH (ref 0.61–1.24)
GFR, Estimated: 20 mL/min — ABNORMAL LOW (ref >60)
Glucose, Bld: 71 mg/dL (ref 70–99)
Potassium: 4.5 mmol/L (ref 3.5–5.1)
Sodium: 138 mmol/L (ref 135–145)
Total Bilirubin: 0.6 mg/dL (ref 0.3–1.2)
Total Protein: 5.7 g/dL — ABNORMAL LOW (ref 6.5–8.1)

## 2022-10-12 MED ORDER — DEXAMETHASONE 4 MG PO TABS
20.0000 mg | ORAL_TABLET | Freq: Once | ORAL | Status: AC
  Administered 2022-10-12: 20 mg via ORAL
  Filled 2022-10-12: qty 5

## 2022-10-12 MED ORDER — HYDROXYZINE HCL 10 MG PO TABS: 10.0000 mg | ORAL_TABLET | Freq: Three times a day (TID) | ORAL | 1 refills | Status: DC | PRN

## 2022-10-12 MED ORDER — ACETAMINOPHEN 325 MG PO TABS
650.0000 mg | ORAL_TABLET | Freq: Once | ORAL | Status: AC
  Administered 2022-10-12: 650 mg via ORAL
  Filled 2022-10-12: qty 2

## 2022-10-12 MED ORDER — MONTELUKAST SODIUM 10 MG PO TABS
10.0000 mg | ORAL_TABLET | Freq: Once | ORAL | Status: AC
  Administered 2022-10-12: 10 mg via ORAL
  Filled 2022-10-12: qty 1

## 2022-10-12 MED ORDER — DARATUMUMAB-HYALURONIDASE-FIHJ 1800-30000 MG-UT/15ML ~~LOC~~ SOLN
1800.0000 mg | Freq: Once | SUBCUTANEOUS | Status: AC
  Administered 2022-10-12: 1800 mg via SUBCUTANEOUS
  Filled 2022-10-12: qty 15

## 2022-10-12 MED ORDER — DIPHENHYDRAMINE HCL 25 MG PO CAPS
50.0000 mg | ORAL_CAPSULE | Freq: Once | ORAL | Status: AC
  Administered 2022-10-12: 50 mg via ORAL
  Filled 2022-10-12: qty 2

## 2022-10-12 MED ORDER — BORTEZOMIB CHEMO SQ INJECTION 3.5 MG (2.5MG/ML)
1.3000 mg/m2 | Freq: Once | INTRAMUSCULAR | Status: AC
  Administered 2022-10-12: 2.25 mg via SUBCUTANEOUS
  Filled 2022-10-12: qty 0.9

## 2022-10-12 NOTE — Progress Notes (Signed)
Patient here today for follow up and treatment consideration regarding myeloma. Patient reports rash improved with prednisone taper but has returned to upper chest and neck after completion of prednisone.  Patient also reports large, reddened area to injection site from treatment last week.

## 2022-10-12 NOTE — Assessment & Plan Note (Signed)
MRI lumbar showed 73m nodule in the cauda equina , possible nerve sheath tumor.  12 mm T2 hyperintense lesion along the inferior aspect of the S1 vertebral body.- this lesion was not reported on recent PET done at [facility]  His back pain has improved. Close monitor symptoms.

## 2022-10-12 NOTE — Assessment & Plan Note (Addendum)
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  Cystoscopy at [facility] showed ureter high-grade carcinoma as well as bladder high-grade papillary carcinoma. S/p palliative radiation. PET scan at [facility] showed disease progression, significant response to therapy in previously noted pelvic mass, which is smaller and less FDG-avid than on prior study.  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

## 2022-10-12 NOTE — Assessment & Plan Note (Signed)
Chemotherapy plan as listed above 

## 2022-10-12 NOTE — Assessment & Plan Note (Signed)
Possible drug reaction. Improved with steroid.  Itching, recommend Atarax '10mg'$  Q8h PRN Rx sent.  Patient gets benadryl as premed for chemo. Added singulair prior to each Daratumumab treatment.

## 2022-10-12 NOTE — Progress Notes (Signed)
Hematology/Oncology Progress note [phone] [phone]      Patient Care Team: Baxter Hire, MD as PCP - General (Internal Medicine) Noreene Filbert, MD as Radiation Oncologist (Radiation Oncology) Baxter Hire, MD (Internal Medicine) Lavonia Dana, MD as Consulting Physician (Nephrology) [name], MD as Consulting Physician (Oncology)  ASSESSMENT & PLAN:   Cancer Staging  Multiple myeloma [facility]) Staging form: Plasma Cell Myeloma and Plasma Cell Disorders, AJCC 8th Edition - Clinical stage from 03/17/2022: RISS Stage III (Beta-2-microglobulin (mg/L): 6.9, Albumin (g/dL): 3.3, ISS: Stage III, High-risk cytogenetics: Present, LDH: Normal) - Signed by [name], MD on 03/17/2022  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 Proceed with cycle 8 Daratumumab and Velcade Hold Aspirin and Revlimid.  Continue Acyclovir 234m BID  -Bone health, currently he does not have an active myeloma bone involvement.  Patient reports history of developing adverse reaction [vision loss] after Prolia treatments. -s/p  root canal.  Hold off bone strengthening agents.   Urothelial carcinoma of distal ureter  (HCC) High-grade urothelial carcinoma, we will present case in the tumor board.  [facility] as well as bladder high-grade papillary carcinoma. S/p palliative radiation. [facility], significant response to therapy in previously noted pelvic mass, which is smaller and less FDG-avid than on prior study.  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 I discussed with pathology Dr.Rubinas and confirmed with her that the pathology is not urothelial origin.  PSA is elevated at 7 Recommend androgen deprivation therapy, proceed with Firmagon loading dose.  Castration sensitive Stage IV prostate cancer, recommend systemic therapy with either Docetaxel or Androgen receptor blockers. Will discuss with phamacist regarding potential drug reaction between his prostate cancer, myeloma and urothelial carcinoma treatment.   Encounter for antineoplastic chemotherapy Chemotherapy plan as listed above  Stage 4 chronic kidney disease (HCC) Avoid nephrotoxins.  Encourage oral hydration.   Skin rash Possible drug reaction. Improved with steroid.  Itching, recommend Atarax 13mQ8h PRN Rx sent.  Patient gets benadryl as premed for chemo. Added singulair prior to each Daratumumab treatment.    Back pain MRI lumbar showed 12m712module in the cauda equina , possible nerve sheath tumor.  12 mm T2 hyperintense lesion along the inferior aspect of the S1 vertebral body.- this lesion was [facility]. Close monitor symptoms.   Refer to genetic counselor  Orders Placed This Encounter  Procedures   Ambulatory referral to Genetics    Referral Priority:   Routine    Referral Type:   Consultation    Referral Reason:   Specialty Services  Required    Number of Visits  Requested:   1    Follow-up To be determined.   All questions were answered. The patient knows to call the clinic with any problems, questions or concerns.  [name], MD, PhD [facility] Hematology Oncology 10/12/2022    CHIEF COMPLAINTS/REASON FOR VISIT:  Multiple myeloma, high-grade urothelial carcinoma.metastatic prostate cancer.   HISTORY OF PRESENTING ILLNESS:  [name] [name] is a 81 y.o. male who was seen in consultation at the request of [name], MD presents for follow-up of multiple myeloma and high-grade urothelial carcinoma management.   Oncology History  Multiple myeloma ([place])  03/02/2022 Imaging   PET showed No definitive signs of multiple myeloma by FDG PET.   Obstruction of the RIGHT ureter with soft tissue mass in the RIGHT pelvis showing increased metabolic activity suspicious first and foremost for urothelial neoplasm given location adjacent to RIGHT UVJ and affect upon the RIGHT ureter.   Little or no excretion of FDG on the RIGHT but still with uptake of FDG by renal parenchyma. Degree of hydronephrosis is not substantially changed but lack of FDG excretion on the RIGHT is compatible with secondary sign of physiologic significance of ureteral obstruction.   RIGHT pelvic sidewall uptake without visible lesion, could represent tiny lymph node not visible on noncontrast imaging.   Aortic atherosclerosis.  Pulmonary emphysema. Aortic Atherosclerosis and Emphysema    03/17/2022 Initial Diagnosis   Multiple myeloma ([place]) -Patient has progressively worsening kidney function.  He is scheduled for kidney biopsy. 01/26/2022, free kappa light chain 1058, lambda 15.3, light chain ratio 69. Protein electrophoresis showed restricted band-M spike migrating in the beta-1 globulin region. ANA negative.  ANCA negative. Random urine protein electrophoresis showed abnormal protein band detected in the gamma globulin And a second possible abnormal protein band that may represent  monoclonal protein.   02/23/2022 multiple myeloma showed IgA 2883, M protein of 1.8, beta 2 microglobulin 6.9, kappa free light chain 1268.9, lambda 13.2, free light chain ratio 96.13.   CMP showed creatinine of 3.41, that is significantly worse than his baseline in June 2022. 02/28/2022, 24-hour urine protein electrophoresis showed M protein of 729 mg, IgA monoclonal kappa light chain. 03/01/2022, [facility] biopsy showed diffuse light chain tubulopathy, and light chain cast nephropathy, moderate interstitial fibrosis,4% global glomerular sclerosis and the marked atherosclerosis.  03/06/2022, patient underwent a bone marrow biopsy. Pathology showed hypercellular bone marrow with plasma cell neoplasm, representing 20% of all cells in the aspirate associated with prominent interstitial infiltrates and numerous predominantly small clusters in the clots in the biopsy sections.  The plasma cells display kappa light chain restriction consistent with plasma cell neoplasm. Normal cytogenetics, myeloma FISH panel positive for 13q deletion, gain of 1q, t (4;14)   03/17/2022 Cancer Staging   Staging form: Plasma Cell Myeloma and Plasma Cell Disorders, AJCC 8th Edition - Clinical stage from 03/17/2022: RISS Stage III (Beta-2-microglobulin (mg/L): 6.9, Albumin (g/dL): 3.3, ISS: Stage III, High-risk cytogenetics: Present, LDH: Normal) - Signed by [name], MD on 03/17/2022 Stage prefix: Initial diagnosis Beta 2 microglobulin range (mg/L): Greater than or equal to 5.5 Albumin range (g/dL): Less than 3.5 Cytogenetics: t(4;14) translocation, Other mutation, 1q addition   03/24/2022 - 07/19/2022 Chemotherapy   Patient is on Treatment Plan : MYELOMA NEWLY DIAGNOSED Daratumumab + Dexamethasone Weekly (DaraRd) q28d     09/21/2022 Imaging   MRI lumbar spine wo contrast 1. 6 mm nodule in the cauda equina at the L4-5 level. This likely  represents a nerve sheath tumor such as a schwannoma or neurofibroma. Recommend MRI of  the lumbar spine with contrast for further evaluation. 2. 12 mm T2 hyperintense lesion along the inferior aspect of the S1 vertebral body. This may represent focal involvement of multiple myeloma. No other discrete lesions are present. No pathologic fracture is present. 3. Mild bilateral foraminal stenosis at L3-4 and L4-5. 4. Left paramedian annular tear at L5-S1 near the left S1 nerve roots.   Urothelial carcinoma of distal ureter ([place])  03/02/2022 Imaging   PET showed No definitive signs of multiple myeloma by FDG PET.   Obstruction of the RIGHT ureter with soft tissue mass in the RIGHT pelvis showing increased metabolic activity suspicious first and foremost for urothelial neoplasm given location adjacent to RIGHT UVJ and affect upon the RIGHT ureter.   Little or no excretion of FDG on the RIGHT but still with uptake of FDG by renal parenchyma. Degree of hydronephrosis is not substantially changed but lack of FDG excretion on the RIGHT is compatible with secondary sign of physiologic significance of ureteral obstruction.   RIGHT pelvic sidewall uptake without visible lesion, could represent tiny lymph node not visible on noncontrast imaging.   Aortic atherosclerosis.  Pulmonary emphysema. Aortic Atherosclerosis and Emphysema    03/17/2022 Initial Diagnosis   Urothelial carcinoma of distal ureter ([place])  -03/14/2022, cystoscopy showed diffuse narrowing right distal ureter with hydroureter following up the distal ureter and extending proximally.  Right ureteroscopy showed nodular tumor distal ureter and a convincing area without tumor suspicious for extrinsic obstruction.s/p   right ureteral stent placement. Ureteral tumor showed a tiny fragments of fibrous tissue, interpretation limited by thermal and crush artifact.  Right distal ureter saline barbotage showed hight grade urothelial carcinoma.    03/17/2022 Cancer Staging   Staging form: Renal Pelvis and Ureter, AJCC 8th Edition -  Clinical: Stage Unknown (cTX, cN0, cM0) - Signed by [name], MD on 03/17/2022 Stage prefix: Initial diagnosis WHO/ISUP grade (low/high): High Grade Histologic grading system: 2 grade system   06/05/2022 Imaging   PET scan at [facility] showed 1.Interval placement of right-sided double-J ureteral stent with resolution of hydronephrosis and improved excretion of radiotracer.  2.There is persistent abnormal hypermetabolic tissue extending posterior lateral from the right aspect of the bladder encasing the distal ureter/stent which measures approximately 5.1 x 4.2 (CT image 243).  3.There are similar linear areas of abnormal hypermetabolic tissue seen to extend more superiorly along the right posterior pelvic wall/peritoneum, and towards the sciatic notch (fused image 238). Linear configuration suggests possible perineural spread. Note that this soft tissue abuts and may encase pelvic vasculature including the external and internal iliac arteries.  4.NEW single subcentimeter but abnormal focus of FDG avidity in the right presacral region (CT image 231) measuring 1.6 cm representing new site of neoplastic disease   06/19/2022 - 08/10/2022 Radiation Therapy   Pelvis RT 06/19/22-08/21/22 Bladder RT 07/27/22- 08/20/22    09/18/2022 Imaging   PET scan at [facility] showed 1. Evidence of disease progression with multifocal new hypermetabolic lesions around the periphery of the liver as described above. The evaluation of these lesions is limited by significant misregistration between the PET and CT portions of the examination. Contrast-enhanced CT or MRI could be used for confirmation and/or guidance of biopsy as necessary. There is also a lung parenchymal nodule with new associated FDG uptake, also suspicious for a site of metastasis.   2. Although the right nephroureteral stent appears to be well positioned, the  right kidney demonstrates slightly increased hydronephrosis with significant surrounding fat stranding and relatively  delayed clearance of FDG. These findings could suggest stent malfunction or be related to upper urinary tract infection. Suggest clinical correlation with any patient signs or symptoms such as right flank pain, fever, and/or urinalysis findings.     09/21/2022 Imaging   MRI lumbar spine wo contrast 1. 6 mm nodule in the cauda equina at the L4-5 level. 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 Recommend MRI of the lumbar spine with contrast for further evaluation. 2. 12 mm T2 hyperintense lesion along the inferior aspect of the S1 vertebral body. This may represent focal involvement of multiple myeloma. No other discrete lesions are present. No pathologic fracture is present. 3. Mild bilateral foraminal stenosis at L3-4 and L4-5. 4. Left paramedian annular tear at L5-S1 near the left S1 nerve roots.   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 male who has above history reviewed by me today presents for follow up visit for multiple myeloma and high-grade urothelial carcinoma, metastatic prostate cancer.   He developed skin rash after Velcade treatment without benadryl, rash improved with prednisone taper but has returned to upper chest and neck after completion of prednisone  Back pain is better, almost resolved.   S/p liver lesion biopsy.   Review of Systems  Constitutional:  Positive for fatigue. Negative for appetite change, chills, fever and unexpected weight change.  HENT:   Negative for hearing loss and voice change.   Eyes:  Negative for eye  problems and icterus.  Respiratory:  Negative for chest tightness, cough and shortness of breath.   Cardiovascular:  Negative for chest pain and leg swelling.  Gastrointestinal:  Negative for abdominal distention, abdominal pain and diarrhea.  Endocrine: Negative for hot flashes.  Genitourinary:  Positive for frequency. Negative for difficulty urinating and dysuria.   Musculoskeletal:  Negative for arthralgias.  Skin:  Positive for rash. Negative for itching.  Neurological:  Negative for light-headedness and numbness.  Hematological:  Negative for adenopathy. Does not bruise/bleed easily.  Psychiatric/Behavioral:  Negative for confusion.      MEDICAL HISTORY:  Past Medical History:  Diagnosis Date   Age related osteoporosis    Anemia    Barrett's esophagus    Bradycardia    Cancer (HCC)    PROSTATE   Cataract    CKD (chronic kidney disease) stage 3, GFR 30-59 ml/min (HCC)    Colon polyps    DDD (degenerative disc disease), cervical    DDD (degenerative disc disease), lumbar    Femur fracture (HCC)    Right   Fundic gland polyposis of stomach    Fundic gland polyps of stomach, benign    Gastritis    GERD (gastroesophageal reflux disease)    Hematuria    Hemorrhage of rectum and anus    History of colon polyps    Insomnia    Lumbago    Osteopenia of the elderly    Skin cancer    Sleep apnea  SURGICAL HISTORY: Past Surgical History:  Procedure Laterality Date   BAND HEMORRHOIDECTOMY     COLONOSCOPY     COLONOSCOPY WITH PROPOFOL N/A 05/06/2018   Procedure: COLONOSCOPY WITH PROPOFOL;  Surgeon: Lollie Sails, MD;  Location: [facility] ENDOSCOPY;  Service: Endoscopy;  Laterality: N/A;   COLONOSCOPY WITH PROPOFOL N/A 09/23/2018   Procedure: COLONOSCOPY WITH PROPOFOL;  Surgeon: Lollie Sails, MD;  Location: [facility] ENDOSCOPY;  Service: Endoscopy;  Laterality: N/A;   COLONOSCOPY WITH PROPOFOL N/A 03/30/2021   Procedure: COLONOSCOPY WITH PROPOFOL;  Surgeon: Tol[place],  Benay Pike, MD;  Location: [facility] ENDOSCOPY;  Service: Gastroenterology;  Laterality: N/A;   CYSTOSCOPY W/ URETERAL STENT PLACEMENT Right 03/14/2022   Procedure: CYSTOSCOPY WITH RETROGRADE PYELOGRAM/URETERAL STENT PLACEMENT;  Surgeon: Abbie Sons, MD;  Location: [facility] ORS;  Service: Urology;  Laterality: Right;   ESOPHAGOGASTRODUODENOSCOPY (EGD) WITH PROPOFOL N/A 10/03/2016   Procedure: ESOPHAGOGASTRODUODENOSCOPY (EGD) WITH PROPOFOL;  Surgeon: Lollie Sails, MD;  Location[facility] ENDOSCOPY;  Service: Endoscopy;  Laterality: N/A;   EYE SURGERY     CATARACTS   EYELID SURGERY     FLEXIBLE SIGMOIDOSCOPY     FRACTURE SURGERY Right 2016   f[place]   FRACTURE SURGERY     ORIF DISTAL FEMUR FRACTURE     TONSILLECTOMY     TRANSURETHRAL RESECTION OF BLADDER TUMOR N/A 03/14/2022   Procedure: TRANSURETHRAL RESECTION OF BLADDER TUMOR (TURBT);  Surgeon: Abbie Sons, MD;  Location: [facility] ORS;  Service: Urology;  Laterality: N/A;    SOCIAL HISTORY: Social History   Socioeconomic History   Marital status: Married    Spouse name: Not on file   Number of children: Not on file   Years of education: Not on file   Highest education level: Not on file  Occupational History   Not on file  Tobacco Use   Smoking status: Former    Packs/day: 1.00    Years: 15.00    Total pack years: 15.00    Types: Cigarettes    Quit date: 10/03/1974    Years since quitting: 48.0   Smokeless tobacco: Never  Vaping Use   Vaping Use: Never used  Substance and Sexual Activity   Alcohol use: Not Currently    Alcohol/week: 1.0 standard drink of alcohol    Types: 1 Cans of beer per week    Comment: 1 beer every 2 weeks   Drug use: No   Sexual activity: Yes    Birth control/protection: None  Other Topics Concern   Not on file  Social History Narrative   ** Merged History Encounter **       Social Determinants of Health   Financial Resource Strain: Not on file  Food Insecurity: Not on file  Transportation  Needs: Not on file  Physical Activity: Not on file  Stress: Not on file  Social Connections: Not on file  Intimate Partner Violence: Not on file    FAMILY HISTORY: Family History  Problem Relation Age of Onset   Breast cancer Mother     ALLERGIES:  is allergic to demerol [meperidine hcl], gabapentin, prolia [denosumab], demerol [meperidine], and nsaids.  MEDICATIONS:  Current Outpatient Medications  Medication Sig Dispense Refill   acyclovir (ZOVIRAX) 200 MG capsule Take 1 capsule (200 mg total) by mouth 2 (two) times daily. 60 capsule 3   Calcium Carbonate (CALCIUM 500 PO) Take 1 tablet by mouth daily.     Cholecalciferol (VITAMIN D3) 125 MCG (5000 UT) CAPS Take 5,000 Units by mouth daily.  ferrous sulfate 325 (65 FE) MG tablet Take 325 mg by mouth daily.     hydrOXYzine (ATARAX) 10 MG tablet Take 1 tablet (10 mg total) by mouth every 8 (eight) hours as needed for itching or anxiety. 30 tablet 1   ipratropium (ATROVENT) 0.03 % nasal spray Place 1 spray into both nostrils in the morning.     losartan (COZAAR) 25 MG tablet Take 25 mg by mouth daily.     melatonin 5 MG TABS Take 5 mg by mouth at bedtime.     Multiple Vitamins-Minerals (MENS 50+ MULTIVITAMIN) TABS Take 1 tablet by mouth daily.     senna (SENOKOT) 8.6 MG TABS tablet Take 2 tablets (17.2 mg total) by mouth daily. 120 tablet 0   traZODone (DESYREL) 50 MG tablet Take 25 mg by mouth at bedtime.     triamcinolone cream (KENALOG) 0.1 % Apply 1 Application topically 2 (two) times daily.     aspirin EC 81 MG tablet Take 1 tablet (81 mg total) by mouth daily. Swallow whole. (Patient not taking: Reported on 10/12/2022) 30 tablet 11   lenalidomide (REVLIMID) 5 MG capsule Take 1 capsule (5 mg total) by mouth daily. Take for 21 days, then hold for 7 days. Repeat every 28 days. (Patient not taking: Reported on 10/12/2022) 21 capsule 0   montelukast (SINGULAIR) 10 MG tablet Take 1 tablet (10 mg total) by mouth See admin instructions.  Take 1 tablet the day prior to Daratumumab treatments, and take 1 tablet daily for 2 days after treatments (Patient not taking: Reported on 10/12/2022) 30 tablet 1   trospium (SANCTURA) 20 MG tablet Take by mouth. Take 1 tablet (20 mg total) by mouth daily as needed (as needed for urinary urgency). (Patient not taking: Reported on 10/12/2022)     No current facility-administered medications for this visit.     PHYSICAL EXAMINATION: ECOG PERFORMANCE STATUS: 1 - Symptomatic but completely ambulatory Today's Vitals   10/12/22 0922  BP: (!) 125/54  Pulse: (!) 50  Temp: (!) 97.4 F (36.3 C)  TempSrc: Tympanic  SpO2: 100%  Weight: 145 lb 1.6 oz (65.8 kg)  PainSc: 0-No pain   Body mass index is 22.73 kg/m.   Physical Exam Constitutional:      General: He is not in acute distress. HENT:     Head: Normocephalic and atraumatic.  Eyes:     General: No scleral icterus. Cardiovascular:     Rate and Rhythm: Normal rate and regular rhythm.  Pulmonary:     Effort: Pulmonary effort is normal. No respiratory distress.     Breath sounds: No wheezing.  Abdominal:     General: There is no distension.     Palpations: Abdomen is soft.  Musculoskeletal:        General: No deformity. Normal range of motion.     Cervical back: Normal range of motion and neck supple.  Skin:    General: Skin is warm and dry.     Findings: Rash present. No erythema.  Neurological:     Mental Status: He is alert and oriented to person, place, and time. Mental status is at baseline.     Cranial Nerves: No cranial nerve deficit.     Coordination: Coordination normal.  Psychiatric:        Mood and Affect: Mood normal.      LABORATORY DATA:  I have reviewed the data as listed    Latest Ref Rng & Units 10/12/2022    8:54 AM  10/06/2022    8:53 AM 10/05/2022   12:36 PM  CBC  WBC 4.0 - 10.5 K/uL 6.0  7.7  4.9   Hemoglobin 13.0 - 17.0 g/dL 10.7  11.1  10.9   Hematocrit 39.0 - 52.0 % 33.1  33.1  34.1   Platelets  150 - 400 K/uL 171  183  163        Latest Ref Rng & Units 10/12/2022    8:54 AM 10/05/2022   12:36 PM 09/28/2022    9:31 AM  CMP  Glucose 70 - 99 mg/dL 71  108  125   BUN 8 - 23 mg/dL 48  40  59   Creatinine 0.61 - 1.24 mg/dL 2.96  2.51  2.71   Sodium 135 - 145 mmol/L 138  137  139   Potassium 3.5 - 5.1 mmol/L 4.5  4.3  4.3   Chloride 98 - 111 mmol/L 107  105  107   CO2 22 - 32 mmol/L _0 Calcium 8.9 - 10.3 mg/dL 8.7  8.2  9.1   Total Protein 6.5 - 8.1 g/dL 5.7  5.9  5.7   Total Bilirubin 0.3 - 1.2 mg/dL 0.6  0.5  0.5   Alkaline Phos 38 - 126 U/L 61  66  67   AST 15 - 41 U/L _1 ALT 0 - 44 U/L _2 Iron/TIBC/Ferritin/ %Sat    Component Value Date/Time   IRON 60 02/23/2022 1523   TIBC 238 (L) 02/23/2022 1523   FERRITIN 86 02/23/2022 1523   IRONPCTSAT 25 02/23/2022 1523       RADIOGRAPHIC STUDIES: I have personally reviewed the radiological images as listed and agreed with the findings in the report.  US BIOPSY (LIVER)  Result Date: 10/06/2022 INDICATION: History of multiple myeloma, new liver lesions by PET-CT EXAM: ULTRASOUND CORE BIOPSY RIGHT INFERIOR LIVER LESION MEDICATIONS: 1% LIDOCAINE LOCAL ANESTHESIA/SEDATION: Moderate (conscious) sedation was employed during this procedure. A total of Versed 1.0 mg and Fentanyl 50 mcg was administered intravenously by the radiology nurse. Total intra-service moderate Sedation Time: 10 minutes. The patient's level of consciousness and vital signs were monitored continuously by radiology nursing throughout the procedure under my direct supervision. COMPLICATIONS: None immediate. PROCEDURE: Informed written consent was obtained from the patient after a thorough discussion of the procedural risks, benefits and alternatives. All questions were addressed. Maximal Sterile Barrier Technique was utilized including caps, mask, sterile gowns, sterile gloves, sterile drape, hand hygiene and skin antiseptic. A timeout was  performed prior to the initiation of the procedure. previous imaging reviewed. preliminary ultrasound performed. a right inferior hepatic lesion was localized and marked for biopsy. under sterile conditions and local anesthesia, the 17 gauge 6.8 cm access was advanced from a lower intercostal approach to the lesion. needle position confirmed with ultrasound. images obtained for documentation. 2 18 gauge cores obtained. samples placed in formalin. needle tract occluded with gel-foam. postprocedure imaging demonstrates no hemorrhage or hematoma. patient tolerated biopsy well. IMPRESSION: Successful CT-guided core biopsy of a right inferior hepatic lesion. Electronically Signed   By: Jerilynn Mages.  Shick M.D.   On: 10/06/2022 10:42   US Venous Img Upper Uni Right  Result Date: 09/22/2022 CLINICAL DATA:  81 year old male with a history of facial swelling EXAM: RIGHT UPPER EXTREMITY VENOUS DOPPLER ULTRASOUND TECHNIQUE: Gray-scale sonography with graded compression, as well as color Doppler and duplex ultrasound were performed to  evaluate the upper extremity deep venous system from the level of the subclavian vein and including the jugular, axillary, basilic, radial, ulnar and upper cephalic vein. Spectral Doppler was utilized to evaluate flow at rest and with distal augmentation maneuvers. COMPARISON:  None Available. FINDINGS: Contralateral Subclavian Vein: Respiratory phasicity is normal and symmetric with the symptomatic side. No evidence of thrombus. Normal compressibility. Internal Jugular Vein: No evidence of thrombus. Normal compressibility, respiratory phasicity and response to augmentation. Subclavian Vein: No evidence of thrombus. Normal compressibility, respiratory phasicity and response to augmentation. Axillary Vein: No evidence of thrombus. Normal compressibility, respiratory phasicity and response to augmentation. Cephalic Vein: No evidence of thrombus. Normal compressibility, respiratory phasicity and response  to augmentation. Basilic Vein: No evidence of thrombus. Normal compressibility, respiratory phasicity and response to augmentation. Brachial Veins: No evidence of thrombus. Normal compressibility, respiratory phasicity and response to augmentation. Radial Veins: No evidence of thrombus. Normal compressibility, respiratory phasicity and response to augmentation. Ulnar Veins: No evidence of thrombus. Normal compressibility, respiratory phasicity and response to augmentation. Other Findings:  None visualized. IMPRESSION: Directed duplex of the right upper extremity negative for DVT Signed, Dulcy Fanny. Nadene Rubins, RPVI [facility] Rad[facility] Electronically Signed   By: Corrie Mckusick D.O.   On: 09/22/2022 16:35   MR Lumbar Spine Wo Contrast  Result Date: 09/21/2022 CLINICAL DATA:  Low back pain. Multiple myeloma. Increased fracture risk. Pain extends into the left lower extremity. EXAM: MRI LUMBAR SPINE WITHOUT CONTRAST TECHNIQUE: Multiplanar, multisequence MR imaging of the lumbar spine was performed. No intravenous contrast was administered. COMPARISON:  CT of the abdomen and pelvis 02/07/2019. Whole-body bone scan 02/07/2019. FINDINGS: Segmentation: 5 non rib-bearing lumbar type vertebral bodies are present. The lowest fully formed vertebral body is L5. Alignment: No significant listhesis is present. Lumbar lordosis is preserved. Vertebrae: A T2 hyperintense lesion along the inferior aspect of the S1 vertebral body measures up to 12 mm. No other discrete marrow lesions are present. Vertebral body heights are normal. Conus medullaris and cauda equina: Conus extends to the T12-L1 level. Conus is within normal limits. A 6 mm nodule is present in the cauda equina at the L4-5 level. Nerve roots are otherwise within normal limits. Paraspinal and other soft tissues: Benign cysts of the liver are again noted. A simple cyst in the posterior aspect of the left kidney is  stable measuring 27 mm. No other discrete solid organ lesions are present. No significant adenopathy is present. Disc levels: T12-L1: Insert normal disc level L1-2: Insert normal disc level L2-3: Mild disc desiccation is present. No significant herniation or stenosis is present. L3-4: A mild broad-based disc bulge is present. Disc extends into the foramina bilaterally. Mild bilateral foraminal stenosis is present. Central canal is patent. L4-5: Mild lateral disc bulging is present. Facet hypertrophy and short pedicles contribute to mild bilateral foraminal stenosis. L5-S1: A broad-based disc protrusion is present. Left paramedian annular tear is present near the left S1 nerve roots. Disc material extends into the foramina bilaterally without significant stenosis. IMPRESSION: 1. 6 mm nodule in the cauda equina at the L4-5 level. 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 Recommend MRI of the lumbar spine with contrast for further evaluation. 2. 12 mm T2 hyperintense lesion along the inferior aspect of the S1 vertebral body. This may represent focal involvement of multiple myeloma. No other discrete lesions are present. No pathologic fracture is present. 3. Mild bilateral foraminal stenosis at L3-4  and L4-5. 4. Left paramedian annular tear at L5-S1 near the left S1 nerve roots. Electronically Signed   By: San Morelle M.D.   On: 09/21/2022 10:11

## 2022-10-12 NOTE — Assessment & Plan Note (Addendum)
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 I discussed with pathology Dr.Rubinas and confirmed with her that the pathology is not urothelial origin.  PSA is elevated at 7 Recommend androgen deprivation therapy, proceed with Firmagon loading dose.  Castration sensitive Stage IV prostate cancer, recommend systemic therapy with either Docetaxel or Androgen receptor blockers. Will discuss with phamacist regarding potential drug reaction between his prostate cancer, myeloma and urothelial carcinoma treatment. I also discussed with [facility] oncology Dr. Vito Berger about this case.  Will send patient's slide to [facility] for expert review. I will hold off additional therapy until expert review is completed.

## 2022-10-12 NOTE — Telephone Encounter (Signed)
Patient wanted to reschedule his appointment to [facility] Dec. 5th. I rescheduled using the tolerance window. Made patient aware that his future appointments would have to be on Tuesday and he is ok with that.  Thank you

## 2022-10-12 NOTE — Assessment & Plan Note (Addendum)
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 Proceed with cycle 8 Daratumumab and Velcade Hold Aspirin and Revlimid.  Continue Acyclovir 280m BID  -Bone health, currently he does not have an active myeloma bone involvement.  Patient reports history of developing adverse reaction [vision loss] after Prolia treatments. -s/p  root canal.  Hold off bone strengthening agents.

## 2022-10-12 NOTE — Assessment & Plan Note (Signed)
Avoid nephrotoxins.  Encourage oral hydration  

## 2022-10-13 ENCOUNTER — Telehealth: Payer: Self-pay

## 2022-10-13 ENCOUNTER — Other Ambulatory Visit: Payer: Self-pay | Admitting: Oncology

## 2022-10-13 ENCOUNTER — Other Ambulatory Visit: Payer: Self-pay

## 2022-10-13 NOTE — Telephone Encounter (Signed)
Request for slide to be sent out for consult, has been faxed to [facility] path

## 2022-10-13 NOTE — Telephone Encounter (Signed)
We would like to get his slide [[id number]] send over to [facility] for a review.Please send to [facility] Dr. Windell Norfolk, also cc Dr. Lossie Faes.  Request sent to [facility] Pathology to have this slide sent.

## 2022-10-16 ENCOUNTER — Inpatient Hospital Stay: Payer: Medicare Other

## 2022-10-16 DIAGNOSIS — R768 Other specified abnormal immunological findings in serum: Secondary | ICD-10-CM

## 2022-10-16 DIAGNOSIS — N289 Disorder of kidney and ureter, unspecified: Secondary | ICD-10-CM

## 2022-10-16 DIAGNOSIS — Z5112 Encounter for antineoplastic immunotherapy: Secondary | ICD-10-CM | POA: Diagnosis not present

## 2022-10-16 DIAGNOSIS — R7689 Other specified abnormal immunological findings in serum: Secondary | ICD-10-CM

## 2022-10-16 MED ORDER — DEGARELIX ACETATE(240 MG DOSE) 120 MG/VIAL ~~LOC~~ SOLR
240.0000 mg | Freq: Once | SUBCUTANEOUS | Status: AC
  Administered 2022-10-16: 240 mg via SUBCUTANEOUS
  Filled 2022-10-16: qty 6

## 2022-10-16 NOTE — Patient Instructions (Signed)
Degarelix Injection What is this medication? DEGARELIX (deg a REL ix) treats prostate cancer. It works by decreasing levels of the hormone testosterone in the body. This prevents prostate cancer cells from spreading or growing. It belongs to a group of medications called GnRH blockers. This medicine may be used for other purposes; ask your health care provider or pharmacist if you have questions. COMMON BRAND NAME(S): Degarelix, Firmagon What should I tell my care team before I take this medication? They need to know if you have any of these conditions: Diabetes Heart disease Kidney disease Liver disease Low levels of potassium or magnesium in the blood Osteoporosis, weak bones An unusual or allergic reaction to degarelix, mannitol, other medications, foods, dyes, or preservatives If you or your partner are pregnant or trying to get pregnant How should I use this medication? This medication is injected under the skin. It is usually given by your care team in a hospital or clinic setting. It may also be given at home. If you get this medication at home, you will be taught how to prepare and give it. Use exactly as directed. Take it as directed on the prescription label at the same time every day. Keep taking it unless your care team tells you to stop. It is important that you put your used needles and syringes in a special sharps container. Do not put them in a trash can. If you do not have a sharps container, call your pharmacist or care team to get one. Talk to your care team about the use of this medication in children. Special care may be needed. Overdosage: If you think you have taken too much of this medicine contact a poison control center or emergency room at once. NOTE: This medicine is only for you. Do not share this medicine with others. What if I miss a dose? If you get this medication at the hospital or clinic: It is important not to miss your dose. Call your care team if you are  unable to keep an appointment. If you give yourself this medication at home: If you miss a dose, take it as soon as you can. If it is almost time for your next dose, take only that dose. Do not take double or extra doses. Call your care team with questions. What may interact with this medication? Do not take this medication with any of the following: Cisapride Dronedarone Pimozide Thioridazine This medication may also interact with the following: Other medications that cause heart rhythm changes This list may not describe all possible interactions. Give your health care provider a list of all the medicines, herbs, non-prescription drugs, or dietary supplements you use. Also tell them if you smoke, drink alcohol, or use illegal drugs. Some items may interact with your medicine. What should I watch for while using this medication? Your condition will be monitored carefully while you are receiving this medication. You may need blood work while taking this medication. Do not rub or scratch injection site. There may be a lump at the injection site, or it may be red or sore for a few days after your dose. This medication may cause infertility. Talk to your care team if you are concerned about your fertility. What side effects may I notice from receiving this medication? Side effects that you should report to your care team as soon as possible: Allergic reactions or angioedema--skin rash, itching or hives, swelling of the face, eyes, lips, tongue, arms, or legs, trouble swallowing or breathing Heart   rhythm changes--fast or irregular heartbeat, dizziness, feeling faint or lightheaded, chest pain, trouble breathing Side effects that usually do not require medical attention (report to your care team if they continue or are bothersome): Hot flashes Pain, redness, or irritation at injection site Weight gain This list may not describe all possible side effects. Call your doctor for medical advice about  side effects. You may report side effects to FDA at 1-800-FDA-1088. Where should I keep my medication? Keep out of the reach of children and pets. This medication is usually given in a hospital or clinic and will not be stored at home. In rare cases, this medication may be given at home. If you are using this medication at home, you will be instructed on how to store this medication. Get rid of any unused medication after the expiration date. To get rid of medications that are no longer needed or have expired: Take the medication to a medication take-back program. Check with your pharmacy or law enforcement to find a location. If you cannot return the medication, ask your pharmacist or care team how to get rid of this medication safely. NOTE: This sheet is a summary. It may not cover all possible information. If you have questions about this medicine, talk to your doctor, pharmacist, or health care provider.  2023 Elsevier/Gold Standard (2022-04-05 00:00:00)  

## 2022-10-18 ENCOUNTER — Ambulatory Visit: Payer: Medicare Other

## 2022-10-18 ENCOUNTER — Other Ambulatory Visit: Payer: Medicare Other

## 2022-10-19 ENCOUNTER — Inpatient Hospital Stay: Payer: Medicare Other

## 2022-10-19 VITALS — BP 121/52 | HR 58 | Temp 97.6°F | Resp 18 | Wt 147.6 lb

## 2022-10-19 DIAGNOSIS — C9000 Multiple myeloma not having achieved remission: Secondary | ICD-10-CM

## 2022-10-19 DIAGNOSIS — Z5112 Encounter for antineoplastic immunotherapy: Secondary | ICD-10-CM | POA: Diagnosis not present

## 2022-10-19 LAB — CBC WITH DIFFERENTIAL/PLATELET
Abs Immature Granulocytes: 0.02 10*3/uL (ref 0.00–0.07)
Basophils Absolute: 0 10*3/uL (ref 0.0–0.1)
Basophils Relative: 0 %
Eosinophils Absolute: 1.7 10*3/uL — ABNORMAL HIGH (ref 0.0–0.5)
Eosinophils Relative: 25 %
HCT: 32.3 % — ABNORMAL LOW (ref 39.0–52.0)
Hemoglobin: 10.5 g/dL — ABNORMAL LOW (ref 13.0–17.0)
Immature Granulocytes: 0 %
Lymphocytes Relative: 8 %
Lymphs Abs: 0.5 10*3/uL — ABNORMAL LOW (ref 0.7–4.0)
MCH: 31.2 pg (ref 26.0–34.0)
MCHC: 32.5 g/dL (ref 30.0–36.0)
MCV: 95.8 fL (ref 80.0–100.0)
Monocytes Absolute: 0.5 10*3/uL (ref 0.1–1.0)
Monocytes Relative: 7 %
Neutro Abs: 4 10*3/uL (ref 1.7–7.7)
Neutrophils Relative %: 60 %
Platelets: 188 10*3/uL (ref 150–400)
RBC: 3.37 MIL/uL — ABNORMAL LOW (ref 4.22–5.81)
RDW: 14.2 % (ref 11.5–15.5)
WBC: 6.7 10*3/uL (ref 4.0–10.5)
nRBC: 0 % (ref 0.0–0.2)

## 2022-10-19 LAB — COMPREHENSIVE METABOLIC PANEL
ALT: 15 U/L (ref 0–44)
AST: 19 U/L (ref 15–41)
Albumin: 3.3 g/dL — ABNORMAL LOW (ref 3.5–5.0)
Alkaline Phosphatase: 62 U/L (ref 38–126)
Anion gap: 9 (ref 5–15)
BUN: 43 mg/dL — ABNORMAL HIGH (ref 8–23)
CO2: 24 mmol/L (ref 22–32)
Calcium: 8.4 mg/dL — ABNORMAL LOW (ref 8.9–10.3)
Chloride: 104 mmol/L (ref 98–111)
Creatinine, Ser: 2.54 mg/dL — ABNORMAL HIGH (ref 0.61–1.24)
GFR, Estimated: 24 mL/min — ABNORMAL LOW (ref >60)
Glucose, Bld: 107 mg/dL — ABNORMAL HIGH (ref 70–99)
Potassium: 4.5 mmol/L (ref 3.5–5.1)
Sodium: 137 mmol/L (ref 135–145)
Total Bilirubin: 0.4 mg/dL (ref 0.3–1.2)
Total Protein: 5.8 g/dL — ABNORMAL LOW (ref 6.5–8.1)

## 2022-10-19 MED ORDER — DIPHENHYDRAMINE HCL 25 MG PO CAPS
50.0000 mg | ORAL_CAPSULE | Freq: Once | ORAL | Status: AC
  Administered 2022-10-19: 50 mg via ORAL
  Filled 2022-10-19: qty 2

## 2022-10-19 MED ORDER — DEXAMETHASONE 4 MG PO TABS
20.0000 mg | ORAL_TABLET | Freq: Once | ORAL | Status: AC
  Administered 2022-10-19: 20 mg via ORAL
  Filled 2022-10-19: qty 5

## 2022-10-19 MED ORDER — BORTEZOMIB CHEMO SQ INJECTION 3.5 MG (2.5MG/ML)
1.3000 mg/m2 | Freq: Once | INTRAMUSCULAR | Status: AC
  Administered 2022-10-19: 2.25 mg via SUBCUTANEOUS
  Filled 2022-10-19: qty 0.9

## 2022-10-19 MED ORDER — PROCHLORPERAZINE MALEATE 10 MG PO TABS
10.0000 mg | ORAL_TABLET | Freq: Four times a day (QID) | ORAL | Status: DC | PRN
  Administered 2022-10-19: 10 mg via ORAL
  Filled 2022-10-19: qty 1

## 2022-10-19 NOTE — Patient Instructions (Signed)
[facility] [place]  Discharge Instructions: Thank you for choosing [facility] to provide your oncology and hematology care.  If you have a lab appointment with the Cancer Center, please go directly to the Cancer Center and check in at the registration area.  Wear comfortable clothing and clothing appropriate for easy access to any Portacath or PICC line.   We strive to give you quality time with your provider. You may need to reschedule your appointment if you arrive late (15 or more minutes).  Arriving late affects you and other patients whose appointments are after yours.  Also, if you miss three or more appointments without notifying the office, you may be dismissed from the clinic at the provider's discretion.      For prescription refill requests, have your pharmacy contact our office and allow 72 hours for refills to be completed.       To help prevent nausea and vomiting after your treatment, we encourage you to take your nausea medication as directed.  BELOW ARE SYMPTOMS THAT SHOULD BE REPORTED IMMEDIATELY: *FEVER GREATER THAN 100.4 F (38 C) OR HIGHER *CHILLS OR SWEATING *NAUSEA AND VOMITING THAT IS NOT CONTROLLED WITH YOUR NAUSEA MEDICATION *UNUSUAL SHORTNESS OF BREATH *UNUSUAL BRUISING OR BLEEDING *URINARY PROBLEMS (pain or burning when urinating, or frequent urination) *BOWEL PROBLEMS (unusual diarrhea, constipation, pain near the anus) TENDERNESS IN MOUTH AND THROAT WITH OR WITHOUT PRESENCE OF ULCERS (sore throat, sores in mouth, or a toothache) UNUSUAL RASH, SWELLING OR PAIN  UNUSUAL VAGINAL DISCHARGE OR ITCHING   Items with * indicate a potential emergency and should be followed up as soon as possible or go to the Emergency Department if any problems should occur.  Please show the CHEMOTHERAPY ALERT CARD or IMMUNOTHERAPY ALERT CARD at check-in to the Emergency Department and triage nurse.  Should you have questions after your  visit or need to cancel or reschedule your appointment, please contact [facility] [place]  [phone] and follow the prompts.  Office hours are 8:00 a.m. to 4:30 p.m. Monday - Friday. Please note that voicemails left after 4:00 p.m. may not be returned until the following business day.  We are closed weekends and major holidays. You have access to a nurse at all times for urgent questions. Please call the main number to the clinic [phone] and follow the prompts.  For any non-urgent questions, you may also contact your provider using MyChart. We now offer e-Visits for anyone 18 and older to request care online for non-urgent symptoms. For details visit [email or web address].   Also download the MyChart app! Go to the app store, search "MyChart", open the app, select [place], and log in with your MyChart username and password.  Masks are optional in the cancer centers. If you would like for your care team to wear a mask while they are taking care of you, please let them know. For doctor visits, patients may have with them one support person who is at least 83 years old. At this time, visitors are not allowed in the infusion area.   

## 2022-10-21 IMAGING — CR DG CHEST 2V
1 series · 2 of 2 positions shown · non-contrast
Comparison: June 23, 2021

CLINICAL DATA: Cough and wheezing.

EXAM:
CHEST - 2 VIEW

[Series 1: dg chest 2 view · 0.14mm/px · 2 of 2 slices shown]
[im 1/2]
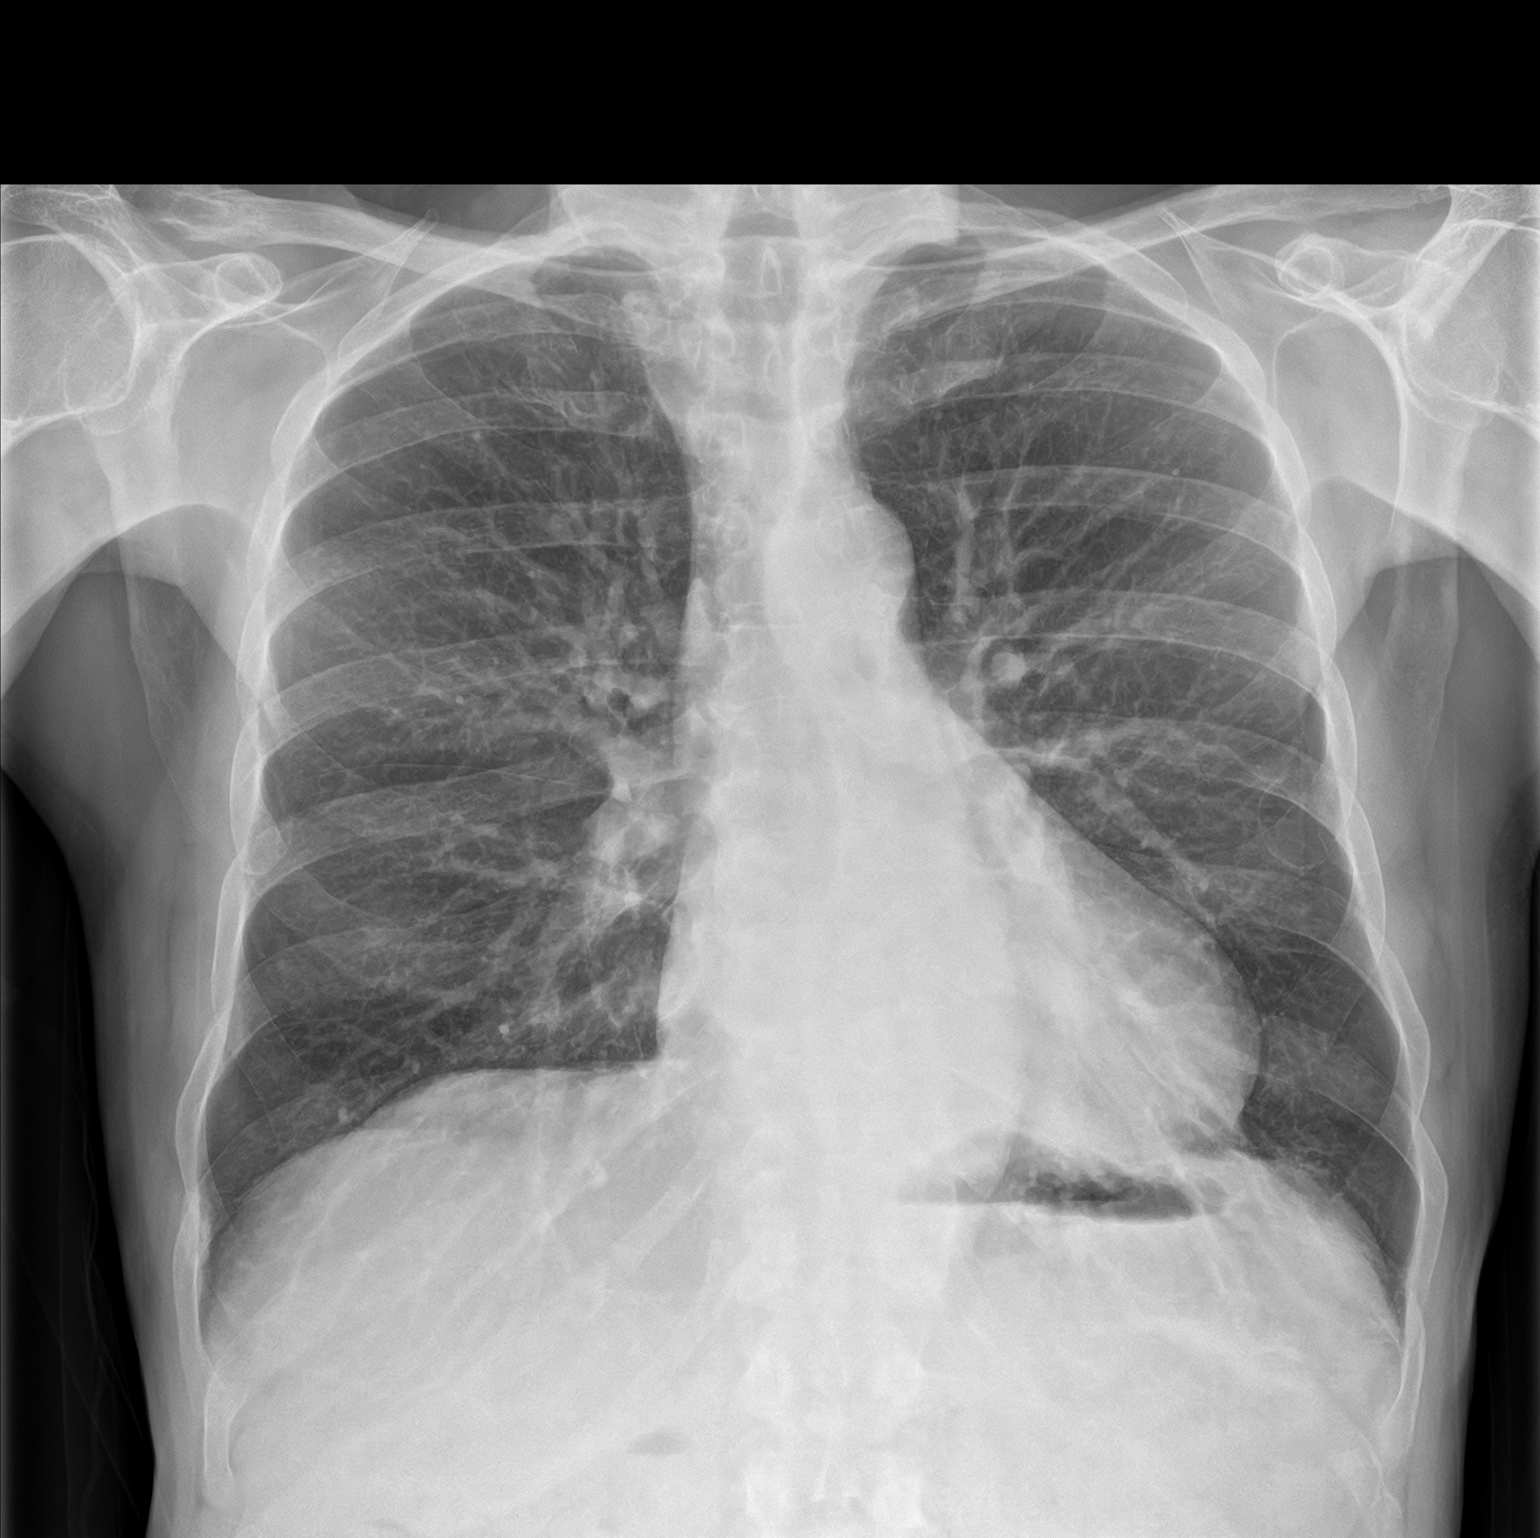
[im 2/2]
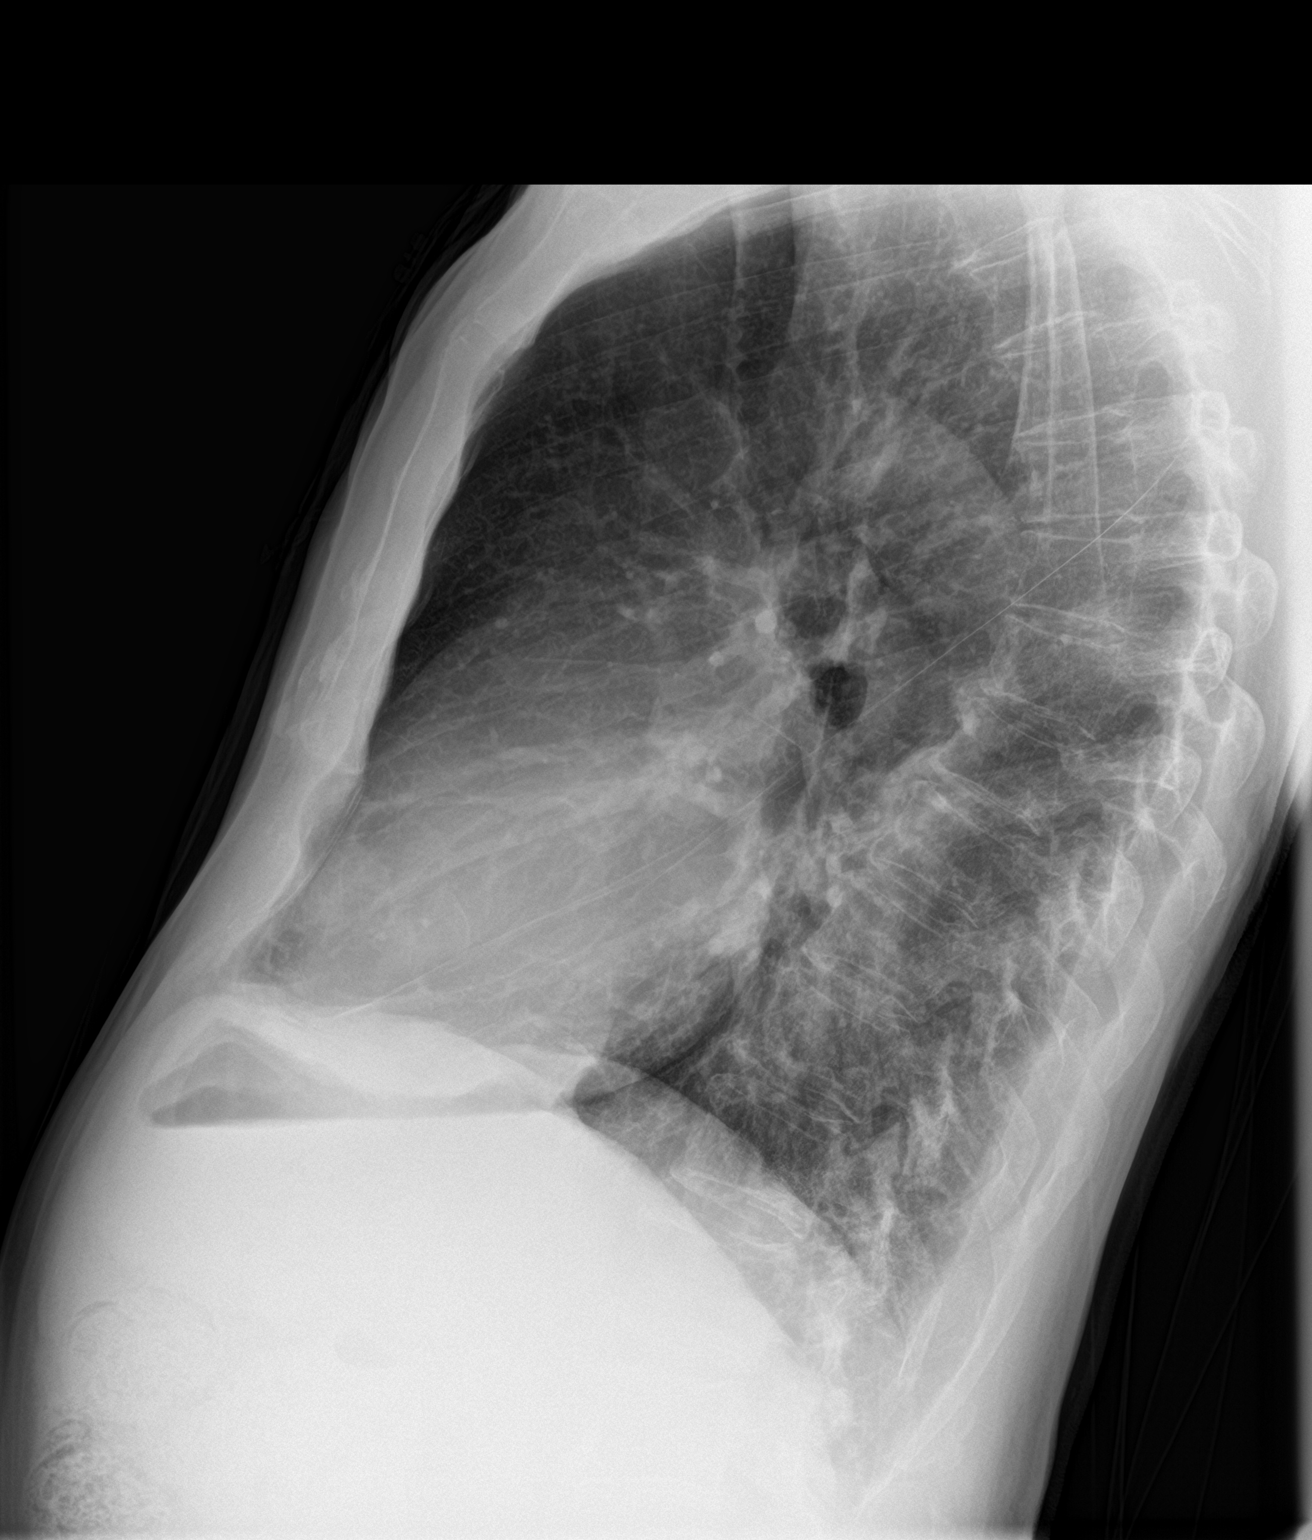

[2 of 2 positions shown; findings below may reference images not displayed]

FINDINGS: The heart size and mediastinal contours are within normal limits.
Mild patchy opacities identified in the left lung base. The right
lung is clear. There is no pleural effusion or pulmonary edema.
Chronic deformity of the right clavicle is noted.
IMPRESSION: Mild patchy opacities identified in the left lung base, developing
pneumonia is not excluded.

## 2022-10-24 ENCOUNTER — Encounter: Payer: Self-pay | Admitting: Oncology

## 2022-10-24 NOTE — Telephone Encounter (Signed)
[facility] path report scanned in media

## 2022-10-25 ENCOUNTER — Ambulatory Visit: Payer: Medicare Other

## 2022-10-25 ENCOUNTER — Other Ambulatory Visit: Payer: Medicare Other

## 2022-10-30 ENCOUNTER — Inpatient Hospital Stay: Payer: Medicare Other

## 2022-10-30 VITALS — BP 140/53 | HR 52 | Temp 98.2°F | Resp 20

## 2022-10-30 DIAGNOSIS — Z5112 Encounter for antineoplastic immunotherapy: Secondary | ICD-10-CM | POA: Diagnosis not present

## 2022-10-30 DIAGNOSIS — C9000 Multiple myeloma not having achieved remission: Secondary | ICD-10-CM

## 2022-10-30 LAB — CBC WITH DIFFERENTIAL/PLATELET
Abs Immature Granulocytes: 0.02 10*3/uL (ref 0.00–0.07)
Basophils Absolute: 0 10*3/uL (ref 0.0–0.1)
Basophils Relative: 1 %
Eosinophils Absolute: 0.6 10*3/uL — ABNORMAL HIGH (ref 0.0–0.5)
Eosinophils Relative: 14 %
HCT: 36.1 % — ABNORMAL LOW (ref 39.0–52.0)
Hemoglobin: 11.5 g/dL — ABNORMAL LOW (ref 13.0–17.0)
Immature Granulocytes: 1 %
Lymphocytes Relative: 12 %
Lymphs Abs: 0.5 10*3/uL — ABNORMAL LOW (ref 0.7–4.0)
MCH: 30.7 pg (ref 26.0–34.0)
MCHC: 31.9 g/dL (ref 30.0–36.0)
MCV: 96.3 fL (ref 80.0–100.0)
Monocytes Absolute: 0.6 10*3/uL (ref 0.1–1.0)
Monocytes Relative: 14 %
Neutro Abs: 2.6 10*3/uL (ref 1.7–7.7)
Neutrophils Relative %: 58 %
Platelets: 207 10*3/uL (ref 150–400)
RBC: 3.75 MIL/uL — ABNORMAL LOW (ref 4.22–5.81)
RDW: 14.2 % (ref 11.5–15.5)
WBC: 4.4 10*3/uL (ref 4.0–10.5)
nRBC: 0 % (ref 0.0–0.2)

## 2022-10-30 LAB — COMPREHENSIVE METABOLIC PANEL
ALT: 18 U/L (ref 0–44)
AST: 21 U/L (ref 15–41)
Albumin: 3.5 g/dL (ref 3.5–5.0)
Alkaline Phosphatase: 66 U/L (ref 38–126)
Anion gap: 4 — ABNORMAL LOW (ref 5–15)
BUN: 36 mg/dL — ABNORMAL HIGH (ref 8–23)
CO2: 26 mmol/L (ref 22–32)
Calcium: 8.9 mg/dL (ref 8.9–10.3)
Chloride: 107 mmol/L (ref 98–111)
Creatinine, Ser: 2.32 mg/dL — ABNORMAL HIGH (ref 0.61–1.24)
GFR, Estimated: 27 mL/min — ABNORMAL LOW (ref >60)
Glucose, Bld: 129 mg/dL — ABNORMAL HIGH (ref 70–99)
Potassium: 5.2 mmol/L — ABNORMAL HIGH (ref 3.5–5.1)
Sodium: 137 mmol/L (ref 135–145)
Total Bilirubin: 0.4 mg/dL (ref 0.3–1.2)
Total Protein: 6.2 g/dL — ABNORMAL LOW (ref 6.5–8.1)

## 2022-10-30 MED ORDER — DIPHENHYDRAMINE HCL 25 MG PO CAPS
50.0000 mg | ORAL_CAPSULE | Freq: Once | ORAL | Status: AC
  Administered 2022-10-30: 50 mg via ORAL
  Filled 2022-10-30: qty 2

## 2022-10-30 MED ORDER — PROCHLORPERAZINE MALEATE 10 MG PO TABS
10.0000 mg | ORAL_TABLET | Freq: Four times a day (QID) | ORAL | Status: DC | PRN
  Administered 2022-10-30: 10 mg via ORAL
  Filled 2022-10-30: qty 1

## 2022-10-30 MED ORDER — DEXAMETHASONE 4 MG PO TABS
20.0000 mg | ORAL_TABLET | Freq: Once | ORAL | Status: AC
  Administered 2022-10-30: 20 mg via ORAL
  Filled 2022-10-30: qty 5

## 2022-10-30 MED ORDER — BORTEZOMIB CHEMO SQ INJECTION 3.5 MG (2.5MG/ML)
1.3000 mg/m2 | Freq: Once | INTRAMUSCULAR | Status: AC
  Administered 2022-10-30: 2.25 mg via SUBCUTANEOUS
  Filled 2022-10-30: qty 0.9

## 2022-10-30 NOTE — Patient Instructions (Signed)
[facility] AT [place]  Discharge Instructions: Thank you for choosing [facility] to provide your oncology and hematology care.  If you have a lab appointment with the [facility], please go directly to the [facility] and check in at the registration area.  Wear comfortable clothing and clothing appropriate for easy access to any Portacath or PICC line.   We strive to give you quality time with your provider. You may need to reschedule your appointment if you arrive late (15 or more minutes).  Arriving late affects you and other patients whose appointments are after yours.  Also, if you miss three or more appointments without notifying the office, you may be dismissed from the clinic at the provider's discretion.      For prescription refill requests, have your pharmacy contact our office and allow 72 hours for refills to be completed.    Today you received the following chemotherapy and/or immunotherapy agents velcade      To help prevent nausea and vomiting after your treatment, we encourage you to take your nausea medication as directed.  BELOW ARE SYMPTOMS THAT SHOULD BE REPORTED IMMEDIATELY: *FEVER GREATER THAN 100.4 F (38 C) OR HIGHER *CHILLS OR SWEATING *NAUSEA AND VOMITING THAT IS NOT CONTROLLED WITH YOUR NAUSEA MEDICATION *UNUSUAL SHORTNESS OF BREATH *UNUSUAL BRUISING OR BLEEDING *URINARY PROBLEMS (pain or burning when urinating, or frequent urination) *BOWEL PROBLEMS (unusual diarrhea, constipation, pain near the anus) TENDERNESS IN MOUTH AND THROAT WITH OR WITHOUT PRESENCE OF ULCERS (sore throat, sores in mouth, or a toothache) UNUSUAL RASH, SWELLING OR PAIN  UNUSUAL VAGINAL DISCHARGE OR ITCHING   Items with * indicate a potential emergency and should be followed up as soon as possible or go to the Emergency Department if any problems should occur.  Please show the CHEMOTHERAPY ALERT CARD or IMMUNOTHERAPY ALERT CARD at check-in to the  Emergency Department and triage nurse.  Should you have questions after your visit or need to cancel or reschedule your appointment, please contact [facility] [place] AT [place]  [phone] and follow the prompts.  Office hours are 8:00 a.m. to 4:30 p.m. Monday - Friday. Please note that voicemails left after 4:00 p.m. may not be returned until the following business day.  We are closed weekends and major holidays. You have access to a nurse at all times for urgent questions. Please call the main number to the clinic [phone] and follow the prompts.  For any non-urgent questions, you may also contact your provider using MyChart. We now offer e-Visits for anyone 22 and older to request care online for non-urgent symptoms. For details visit [email or web address].   Also download the MyChart app! Go to the app store, search "MyChart", open the app, select [place], and log in with your MyChart username and password.  Masks are optional in the cancer centers. If you would like for your care team to wear a mask while they are taking care of you, please let them know. For doctor visits, patients may have with them one support person who is at least 83 years old. At this time, visitors are not allowed in the infusion area.

## 2022-10-31 ENCOUNTER — Other Ambulatory Visit: Payer: Self-pay | Admitting: Oncology

## 2022-10-31 ENCOUNTER — Encounter: Payer: Medicare Other | Admitting: Licensed Clinical Social Worker

## 2022-10-31 DIAGNOSIS — C9000 Multiple myeloma not having achieved remission: Secondary | ICD-10-CM

## 2022-11-01 ENCOUNTER — Encounter: Payer: Self-pay | Admitting: Licensed Clinical Social Worker

## 2022-11-01 ENCOUNTER — Other Ambulatory Visit: Payer: Self-pay | Admitting: Oncology

## 2022-11-01 ENCOUNTER — Telehealth: Payer: Self-pay

## 2022-11-01 ENCOUNTER — Ambulatory Visit: Payer: Medicare Other

## 2022-11-01 ENCOUNTER — Other Ambulatory Visit: Payer: Medicare Other

## 2022-11-01 NOTE — Telephone Encounter (Signed)
-----   Message from [name], MD sent at 10/31/2022 10:43 PM EST ----- Please advise him to avoid food rich in potassium.  discontinue Revlimid.  I cancelled his appt on 12/5 Velcade.  Updated his chemo IS to 12/7 lab MD [place]. Thanks.

## 2022-11-01 NOTE — Telephone Encounter (Signed)
called pt and informed him of MD recommendation. He confirmed that he had discontinued Revlimid a few months ago.   Informed him that appts will be updated and he verbalized understanding, but he does have and appt on 12/7 at 1 pm with his PCP. He is willing to come in the morning on 12/7 or the next day.

## 2022-11-01 NOTE — Telephone Encounter (Signed)
IS has been updated per MD.  Please cancel appts on 12/5 Lab/MD/ [place] (per IS) on 12/7.   Pt would like morning appt as he has another appt that day in the afternoon. If unable to accommodate on 12/7, ok to do 12/8, per IS tolerance. Please inform pt of appt update.

## 2022-11-01 NOTE — Telephone Encounter (Signed)
Error

## 2022-11-02 ENCOUNTER — Inpatient Hospital Stay: Payer: Medicare Other | Admitting: Licensed Clinical Social Worker

## 2022-11-02 ENCOUNTER — Inpatient Hospital Stay: Payer: Medicare Other

## 2022-11-06 ENCOUNTER — Other Ambulatory Visit: Payer: Medicare Other

## 2022-11-06 ENCOUNTER — Ambulatory Visit: Payer: Medicare Other

## 2022-11-07 ENCOUNTER — Ambulatory Visit: Payer: Medicare Other

## 2022-11-07 ENCOUNTER — Other Ambulatory Visit: Payer: Medicare Other

## 2022-11-08 ENCOUNTER — Inpatient Hospital Stay: Payer: Medicare Other

## 2022-11-08 ENCOUNTER — Encounter: Payer: Self-pay | Admitting: Licensed Clinical Social Worker

## 2022-11-08 ENCOUNTER — Inpatient Hospital Stay: Payer: Medicare Other | Attending: Oncology | Admitting: Licensed Clinical Social Worker

## 2022-11-08 ENCOUNTER — Other Ambulatory Visit: Payer: Self-pay

## 2022-11-08 DIAGNOSIS — K219 Gastro-esophageal reflux disease without esophagitis: Secondary | ICD-10-CM | POA: Diagnosis not present

## 2022-11-08 DIAGNOSIS — Z85828 Personal history of other malignant neoplasm of skin: Secondary | ICD-10-CM | POA: Diagnosis not present

## 2022-11-08 DIAGNOSIS — Z8719 Personal history of other diseases of the digestive system: Secondary | ICD-10-CM | POA: Insufficient documentation

## 2022-11-08 DIAGNOSIS — Z79624 Long term (current) use of inhibitors of nucleotide synthesis: Secondary | ICD-10-CM | POA: Insufficient documentation

## 2022-11-08 DIAGNOSIS — M47819 Spondylosis without myelopathy or radiculopathy, site unspecified: Secondary | ICD-10-CM | POA: Diagnosis not present

## 2022-11-08 DIAGNOSIS — M51A3 Intervertebral annulus fibrosus defect, lumbosacral region, unspecified size: Secondary | ICD-10-CM | POA: Insufficient documentation

## 2022-11-08 DIAGNOSIS — Z8042 Family history of malignant neoplasm of prostate: Secondary | ICD-10-CM

## 2022-11-08 DIAGNOSIS — C669 Malignant neoplasm of unspecified ureter: Secondary | ICD-10-CM | POA: Insufficient documentation

## 2022-11-08 DIAGNOSIS — Z79818 Long term (current) use of other agents affecting estrogen receptors and estrogen levels: Secondary | ICD-10-CM | POA: Diagnosis not present

## 2022-11-08 DIAGNOSIS — Z79899 Other long term (current) drug therapy: Secondary | ICD-10-CM | POA: Insufficient documentation

## 2022-11-08 DIAGNOSIS — I700 Atherosclerosis of aorta: Secondary | ICD-10-CM | POA: Diagnosis not present

## 2022-11-08 DIAGNOSIS — Z7982 Long term (current) use of aspirin: Secondary | ICD-10-CM | POA: Insufficient documentation

## 2022-11-08 DIAGNOSIS — G4700 Insomnia, unspecified: Secondary | ICD-10-CM | POA: Diagnosis not present

## 2022-11-08 DIAGNOSIS — C787 Secondary malignant neoplasm of liver and intrahepatic bile duct: Secondary | ICD-10-CM | POA: Insufficient documentation

## 2022-11-08 DIAGNOSIS — N184 Chronic kidney disease, stage 4 (severe): Secondary | ICD-10-CM | POA: Insufficient documentation

## 2022-11-08 DIAGNOSIS — M8580 Other specified disorders of bone density and structure, unspecified site: Secondary | ICD-10-CM | POA: Insufficient documentation

## 2022-11-08 DIAGNOSIS — M48061 Spinal stenosis, lumbar region without neurogenic claudication: Secondary | ICD-10-CM | POA: Diagnosis not present

## 2022-11-08 DIAGNOSIS — L299 Pruritus, unspecified: Secondary | ICD-10-CM | POA: Insufficient documentation

## 2022-11-08 DIAGNOSIS — J439 Emphysema, unspecified: Secondary | ICD-10-CM | POA: Insufficient documentation

## 2022-11-08 DIAGNOSIS — Z87891 Personal history of nicotine dependence: Secondary | ICD-10-CM | POA: Diagnosis not present

## 2022-11-08 DIAGNOSIS — Z803 Family history of malignant neoplasm of breast: Secondary | ICD-10-CM | POA: Diagnosis not present

## 2022-11-08 DIAGNOSIS — Z5112 Encounter for antineoplastic immunotherapy: Secondary | ICD-10-CM | POA: Diagnosis present

## 2022-11-08 DIAGNOSIS — C9000 Multiple myeloma not having achieved remission: Secondary | ICD-10-CM

## 2022-11-08 DIAGNOSIS — C61 Malignant neoplasm of prostate: Secondary | ICD-10-CM | POA: Diagnosis not present

## 2022-11-08 DIAGNOSIS — M5137 Other intervertebral disc degeneration, lumbosacral region: Secondary | ICD-10-CM | POA: Insufficient documentation

## 2022-11-08 DIAGNOSIS — N136 Pyonephrosis: Secondary | ICD-10-CM | POA: Insufficient documentation

## 2022-11-08 DIAGNOSIS — D631 Anemia in chronic kidney disease: Secondary | ICD-10-CM | POA: Insufficient documentation

## 2022-11-08 LAB — CBC WITH DIFFERENTIAL/PLATELET
Abs Immature Granulocytes: 0.01 10*3/uL (ref 0.00–0.07)
Basophils Absolute: 0 10*3/uL (ref 0.0–0.1)
Basophils Relative: 1 %
Eosinophils Absolute: 0.7 10*3/uL — ABNORMAL HIGH (ref 0.0–0.5)
Eosinophils Relative: 17 %
HCT: 35.6 % — ABNORMAL LOW (ref 39.0–52.0)
Hemoglobin: 11.4 g/dL — ABNORMAL LOW (ref 13.0–17.0)
Immature Granulocytes: 0 %
Lymphocytes Relative: 13 %
Lymphs Abs: 0.6 10*3/uL — ABNORMAL LOW (ref 0.7–4.0)
MCH: 30.9 pg (ref 26.0–34.0)
MCHC: 32 g/dL (ref 30.0–36.0)
MCV: 96.5 fL (ref 80.0–100.0)
Monocytes Absolute: 0.6 10*3/uL (ref 0.1–1.0)
Monocytes Relative: 13 %
Neutro Abs: 2.5 10*3/uL (ref 1.7–7.7)
Neutrophils Relative %: 56 %
Platelets: 182 10*3/uL (ref 150–400)
RBC: 3.69 MIL/uL — ABNORMAL LOW (ref 4.22–5.81)
RDW: 14.2 % (ref 11.5–15.5)
WBC: 4.4 10*3/uL (ref 4.0–10.5)
nRBC: 0 % (ref 0.0–0.2)

## 2022-11-08 LAB — COMPREHENSIVE METABOLIC PANEL
ALT: 18 U/L (ref 0–44)
AST: 23 U/L (ref 15–41)
Albumin: 3.6 g/dL (ref 3.5–5.0)
Alkaline Phosphatase: 76 U/L (ref 38–126)
Anion gap: 8 (ref 5–15)
BUN: 44 mg/dL — ABNORMAL HIGH (ref 8–23)
CO2: 26 mmol/L (ref 22–32)
Calcium: 8.6 mg/dL — ABNORMAL LOW (ref 8.9–10.3)
Chloride: 106 mmol/L (ref 98–111)
Creatinine, Ser: 2.29 mg/dL — ABNORMAL HIGH (ref 0.61–1.24)
GFR, Estimated: 28 mL/min — ABNORMAL LOW (ref >60)
Glucose, Bld: 105 mg/dL — ABNORMAL HIGH (ref 70–99)
Potassium: 4.2 mmol/L (ref 3.5–5.1)
Sodium: 140 mmol/L (ref 135–145)
Total Bilirubin: 0.7 mg/dL (ref 0.3–1.2)
Total Protein: 5.9 g/dL — ABNORMAL LOW (ref 6.5–8.1)

## 2022-11-08 NOTE — Progress Notes (Signed)
REFERRING PROVIDER: [name], MD [place],  [place] [address]  PRIMARY PROVIDER:  Baxter Hire, MD  PRIMARY REASON FOR VISIT:  1. Multiple myeloma not having achieved remission ([place])   2. 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 Family history of breast cancer   5. Family history of prostate cancer      HISTORY OF PRESENT ILLNESS:   [name] [name], a 81 y.o. male, was seen for a [place] cancer genetics consultation at the request of Dr. Tasia Catchings due to a personal and family history of cancer.  [name] [name] presents to clinic today to discuss the possibility of a hereditary predisposition to cancer, genetic testing, and to further clarify his future cancer risks, as well as potential cancer risks for family members.   CANCER HISTORY:  In 2019, [name] [name] was diagnosed with prostate cancer. This was treated with radiation.  In 2023, [name] [name] was diagnosed with multiple myeloma and urothelial carcinoma of distal ureter. In 2023, [name] [name]'s prostate cancer was found to be metastatic to the liver.   Oncology History  Multiple myeloma ([place])  03/02/2022 Imaging   PET showed No definitive signs of multiple myeloma by FDG PET.   Obstruction of the RIGHT ureter with soft tissue mass in the RIGHT pelvis showing increased metabolic activity suspicious first and foremost for urothelial neoplasm given location adjacent to RIGHT UVJ and affect upon the RIGHT ureter.   Little or no excretion of FDG on the RIGHT but still with uptake of FDG by renal parenchyma. Degree of hydronephrosis is not substantially changed but lack of FDG excretion on the RIGHT is compatible with secondary sign of physiologic significance of ureteral obstruction.   RIGHT pelvic sidewall uptake without visible lesion, could represent tiny lymph node not visible on noncontrast imaging.   Aortic atherosclerosis.  Pulmonary emphysema. Aortic Atherosclerosis and  Emphysema    03/17/2022 Initial Diagnosis   Multiple myeloma ([place]) -Patient has progressively worsening kidney function.  He is scheduled for kidney biopsy. 01/26/2022, free kappa light chain 1058, lambda 15.3, light chain ratio 69. Protein electrophoresis showed restricted band-M spike migrating in the beta-1 globulin region. ANA negative.  ANCA negative. Random urine protein electrophoresis showed abnormal protein band detected in the gamma globulin And a second possible abnormal protein band that may represent monoclonal protein.   02/23/2022 multiple myeloma showed IgA 2883, M protein of 1.8, beta 2 microglobulin 6.9, kappa free light chain 1268.9, lambda 13.2, free light chain ratio 96.13.   CMP showed creatinine of 3.41, that is significantly worse than his baseline in June 2022. 02/28/2022, 24-hour urine protein electrophoresis showed M protein of 729 mg, IgA monoclonal kappa light chain. 03/01/2022, [facility] kidney biopsy showed diffuse light chain tubulopathy, and light chain cast nephropathy, moderate interstitial fibrosis,4% global glomerular sclerosis and the marked atherosclerosis.  03/06/2022, patient underwent a bone marrow biopsy. Pathology showed hypercellular bone marrow with plasma cell neoplasm, representing 20% of all cells in the aspirate associated with prominent interstitial infiltrates and numerous predominantly small clusters in the clots in the biopsy sections.  The plasma cells display kappa light chain restriction consistent with plasma cell neoplasm. Normal cytogenetics, myeloma FISH panel positive for 13q deletion, gain of 1q, t (4;14)   03/17/2022 Cancer Staging   Staging form: Plasma Cell Myeloma and Plasma Cell Disorders, AJCC 8th Edition - Clinical stage from 03/17/2022: RISS Stage III (Beta-2-microglobulin (mg/L): 6.9, Albumin (g/dL): 3.3,  ISS: Stage III, High-risk cytogenetics: Present, LDH: Normal) - Signed by [name], MD on 03/17/2022 Stage prefix: Initial  diagnosis Beta 2 microglobulin range (mg/L): Greater than or equal to 5.5 Albumin range (g/dL): Less than 3.5 Cytogenetics: t(4;14) translocation, Other mutation, 1q addition   03/24/2022 - 07/19/2022 Chemotherapy   Patient is on Treatment Plan : MYELOMA NEWLY DIAGNOSED Daratumumab + Dexamethasone Weekly (DaraRd) q28d     09/21/2022 Imaging   MRI lumbar spine wo contrast 1. 6 mm nodule in the cauda equina at the L4-5 level. 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 Recommend MRI of the lumbar spine with contrast for further evaluation. 2. 12 mm T2 hyperintense lesion along the inferior aspect of the S1 vertebral body. This may represent focal involvement of multiple myeloma. No other discrete lesions are present. No pathologic fracture is present. 3. Mild bilateral foraminal stenosis at L3-4 and L4-5. 4. Left paramedian annular tear at L5-S1 near the left S1 nerve roots.   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   Obstruction of the RIGHT ureter with soft tissue mass in the RIGHT pelvis showing increased metabolic activity suspicious first and foremost for urothelial neoplasm given location adjacent to RIGHT UVJ and affect upon the RIGHT ureter.   Little or no excretion of FDG on the RIGHT but still with uptake of FDG by renal parenchyma. Degree of hydronephrosis is not substantially changed but lack of FDG excretion on the RIGHT is compatible with secondary sign of physiologic significance of ureteral obstruction.   RIGHT pelvic sidewall uptake without visible lesion, could represent tiny lymph node not visible on noncontrast imaging.   Aortic atherosclerosis.  Pulmonary emphysema. 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  Right ureteroscopy showed nodular tumor distal ureter and a convincing area without tumor suspicious for extrinsic obstruction.s/p   right ureteral stent placement. Ureteral tumor showed a tiny fragments of fibrous tissue, interpretation limited by thermal and crush artifact.  Right distal ureter saline barbotage showed hight grade urothelial carcinoma.    03/17/2022 Cancer Staging   Staging form: Renal Pelvis and Ureter, AJCC 8th Edition - Clinical: Stage Unknown (cTX, cN0, cM0) - Signed by [name], MD on 03/17/2022 Stage prefix: Initial diagnosis WHO/ISUP grade (low/high): High Grade Histologic grading system: 2 grade system   06/05/2022 Imaging   PET scan at [facility] showed 1.Interval placement of right-sided double-J ureteral stent with resolution of hydronephrosis and improved excretion of radiotracer.  2.There is persistent abnormal hypermetabolic tissue extending posterior lateral from the right aspect of the bladder encasing the distal ureter/stent which measures approximately 5.1 x 4.2 (CT image 243).  3.There are similar linear areas of abnormal hypermetabolic tissue seen to extend more superiorly along the right posterior pelvic wall/peritoneum, and towards the sciatic notch (fused image 238). Linear configuration suggests possible perineural spread. Note that this soft tissue abuts and may encase pelvic vasculature including the external and internal iliac arteries.  4.NEW single subcentimeter but abnormal focus of FDG avidity in the right presacral region (CT image 231) measuring 1.6 cm representing new site of neoplastic disease   06/19/2022 - 08/10/2022 Radiation Therapy   Pelvis RT 06/19/22-08/21/22 Bladder RT 07/27/22- 08/20/22    09/18/2022  Imaging   PET scan at [facility] showed 1. Evidence of disease progression with multifocal new hypermetabolic lesions around the periphery of the liver as described above. The  evaluation of these lesions is limited by significant misregistration between the PET and CT portions of the examination. Contrast-enhanced CT or MRI could be used for confirmation and/or guidance of biopsy as necessary. There is also a lung parenchymal nodule with new associated FDG uptake, also suspicious for a site of metastasis.   2. Although the right nephroureteral stent appears to be well positioned, the right kidney demonstrates slightly increased hydronephrosis with significant surrounding fat stranding and relatively delayed clearance of FDG. These findings could suggest stent malfunction or be related to upper urinary tract infection. Suggest clinical correlation with any patient signs or symptoms such as right flank pain, fever, and/or urinalysis findings.     09/21/2022 Imaging   MRI lumbar spine wo contrast 1. 6 mm nodule in the cauda equina at the L4-5 level. 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 Recommend MRI of the lumbar spine with contrast for further evaluation. 2. 12 mm T2 hyperintense lesion along the inferior aspect of the S1 vertebral body. This may represent focal involvement of multiple myeloma. No other discrete lesions are present. No pathologic fracture is present. 3. Mild bilateral foraminal stenosis at L3-4 and L4-5. 4. Left paramedian annular tear at L5-S1 near the left S1 nerve roots.   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[place])   10/12/2022 Cancer Staging   Staging form: Prostate, AJCC 8th Edition - Clinical: Stage IVB (cTX, cNX, pM1, PSA: 7, Grade Group: 3) - Signed by [name], MD on 10/12/2022 Prostate specific antigen (PSA) range: Less than 10 Gleason score: 7 Histologic grading system: 5 grade system    Past Medical History:  Diagnosis Date   Age related osteoporosis    Anemia    Barrett's esophagus    Bradycardia    Cancer (HCC)     PROSTATE   Cataract    CKD (chronic kidney disease) stage 3, GFR 30-59 ml/min (HCC)    Colon polyps    DDD (degenerative disc disease), cervical    DDD (degenerative disc disease), lumbar    Femur fracture (HCC)    Right   Fundic gland polyposis of stomach    Fundic gland polyps of stomach, benign    Gastritis    GERD (gastroesophageal reflux disease)    Hematuria    Hemorrhage of rectum and anus    History of colon polyps    Insomnia    Lumbago    Osteopenia of the elderly    Skin cancer    Sleep apnea     Past Surgical History:  Procedure Laterality Date   BAND HEMORRHOIDECTOMY     COLONOSCOPY     COLONOSCOPY WITH PROPOFOL N/A 05/06/2018   Procedure: COLONOSCOPY WITH PROPOFOL;  Surgeon: Lollie Sails, MD;  Location: [facility] ENDOSCOPY;  Service: Endoscopy;  Laterality: N/A;   COLONOSCOPY WITH PROPOFOL N/A 09/23/2018   Procedure: COLONOSCOPY WITH PROPOFOL;  Surgeon: Lollie Sails, MD;  Location: [facility] ENDOSCOPY;  Service: Endoscopy;  Laterality: N/A;   COLONOSCOPY WITH PROPOFOL N/A 03/30/2021   Procedure: COLONOSCOPY WITH PROPOFOL;  Surgeon: Toledo, Benay Pike, MD;  Location: [facility] ENDOSCOPY;  Service: Gastroenterology;  Laterality: N/A;   CYSTOSCOPY W/ URETERAL STENT PLACEMENT Right 03/14/2022   Procedure: CYSTOSCOPY WITH RETROGRADE PYELOGRAM/URETERAL STENT  PLACEMENT;  Surgeon: Abbie Sons, MD;  Location: [facility] ORS;  Service: Urology;  Laterality: Right;   ESOPHAGOGASTRODUODENOSCOPY (EGD) WITH PROPOFOL N/A 10/03/2016   Procedure: ESOPHAGOGASTRODUODENOSCOPY (EGD) WITH PROPOFOL;  Surgeon: Lollie Sails, MD;  Location: [facility] ENDOSCOPY;  Service: Endoscopy;  Laterality: N/A;   EYE SURGERY     CATARACTS   EYELID SURGERY     FLEXIBLE SIGMOIDOSCOPY     FRACTURE SURGERY Right 2016   [place]   FRACTURE SURGERY     ORIF DISTAL FEMUR FRACTURE     TONSILLECTOMY     TRANSURETHRAL RESECTION OF BLADDER TUMOR N/A 03/14/2022   Procedure: TRANSURETHRAL RESECTION OF BLADDER TUMOR  (TURBT);  Surgeon: Abbie Sons, MD;  Location: [facility] ORS;  Service: Urology;  Laterality: N/A;    FAMILY HISTORY:  We obtained a detailed, 4-generation family history.  Significant diagnoses are listed below: Family History  Problem Relation Age of Onset   Breast cancer Mother        dx 40s-50s   Prostate cancer Paternal Grandfather 11   Breast cancer Cousin    [name] [name] has 1 daughter, 55. He had 3 brothers, none have had cancer.  [name] [name] mother had breast cancer in her late 37s- early 41s but did not pass of it, she passed at 5. Patient's mother's first cousin had breast cancer and is living at 5, and another cousin had metastatic kidney cancer that he passed from.   [name] [name] father passed at 36. Patient's paternal grandfather had prostate cancer that he passed of at 63.   [name] [name] is unaware of previous family history of genetic testing for hereditary cancer risks. There is no reported Ashkenazi Jewish ancestry. There is no known consanguinity.    GENETIC COUNSELING ASSESSMENT: [name] [name] is a 81 y.o. male with a personal history of multiple cancers, including metastatic prostate cancer, and family history of cancer which is somewhat suggestive of a hereditary cancer syndrome and predisposition to cancer. We, therefore, discussed and recommended the following at today's visit.   DISCUSSION: We discussed that approximately 10% of prostate cancer is hereditary. Most cases of hereditary prostate cancer are associated with BRCA1/BRCA2 genes, although there are other genes associated with hereditary cancer as well. Cancers and risks are gene specific. We discussed that testing is beneficial for several reasons including knowing about cancer risks, identifying potential screening and risk-reduction options that may be appropriate, and to understand if other family members could be at risk for cancer and allow them to undergo genetic testing.   We reviewed the characteristics, features  and inheritance patterns of hereditary cancer syndromes. We also discussed genetic testing, including the appropriate family members to test, the process of testing, insurance coverage and turn-around-time for results. We discussed the implications of a negative, positive and/or variant of uncertain significant result. We recommended [name] [name] pursue genetic testing for the [facility] Multi-Cancer+RNA gene panel.   Based on [name] [name] personal and family history of cancer, he meets medical criteria for genetic testing. Despite that he meets criteria, he may still have an out of pocket cost. We discussed that if his out of pocket cost for testing is over $100, the laboratory will call and confirm whether he wants to proceed with testing.  If the out of pocket cost of testing is less than $100 he will be billed by the genetic testing laboratory.   PLAN: After considering the risks, benefits, and limitations, [name] [name] provided informed consent to pursue genetic testing and  the blood sample was sent to [facility] for analysis of the Multi-Cancer+RNA panel. Results should be available within approximately 2-3 weeks' time, at which point they will be disclosed by telephone to [name] [name], as will any additional recommendations warranted by these results. [name] [name] will receive a summary of his genetic counseling visit and a copy of his results once available. This information will also be available in Epic.   [name] [name]. [place] questions were answered to his satisfaction today. Our contact information was provided should additional questions or concerns arise. Thank you for the referral and allowing [place] to share in the care of your patient.   Faith Rogue, MS, [facility] Genetic Counselor [email or web address] Phone: [phone]  The patient was seen for a total of 25 minutes in face-to-face genetic counseling.  Dr. Grayland Ormond was available for discussion regarding this case.    _______________________________________________________________________ For Office Staff:  Number of people involved in session: 1 Was an Intern/ student involved with case: no

## 2022-11-09 ENCOUNTER — Inpatient Hospital Stay: Payer: Medicare Other

## 2022-11-09 ENCOUNTER — Encounter: Payer: Self-pay | Admitting: Oncology

## 2022-11-09 ENCOUNTER — Inpatient Hospital Stay (HOSPITAL_BASED_OUTPATIENT_CLINIC_OR_DEPARTMENT_OTHER): Payer: Medicare Other | Admitting: Oncology

## 2022-11-09 VITALS — BP 121/53 | HR 54 | Temp 96.0°F | Wt 145.6 lb

## 2022-11-09 DIAGNOSIS — Z5111 Encounter for antineoplastic chemotherapy: Secondary | ICD-10-CM

## 2022-11-09 DIAGNOSIS — C9000 Multiple myeloma not having achieved remission: Secondary | ICD-10-CM

## 2022-11-09 DIAGNOSIS — N184 Chronic kidney disease, stage 4 (severe): Secondary | ICD-10-CM | POA: Diagnosis not present

## 2022-11-09 DIAGNOSIS — L299 Pruritus, unspecified: Secondary | ICD-10-CM

## 2022-11-09 DIAGNOSIS — D631 Anemia in chronic kidney disease: Secondary | ICD-10-CM

## 2022-11-09 DIAGNOSIS — C787 Secondary malignant neoplasm of liver and intrahepatic bile duct: Secondary | ICD-10-CM

## 2022-11-09 DIAGNOSIS — C61 Malignant neoplasm of prostate: Secondary | ICD-10-CM

## 2022-11-09 DIAGNOSIS — C669 Malignant neoplasm of unspecified ureter: Secondary | ICD-10-CM | POA: Diagnosis not present

## 2022-11-09 DIAGNOSIS — Z5112 Encounter for antineoplastic immunotherapy: Secondary | ICD-10-CM | POA: Diagnosis not present

## 2022-11-09 LAB — CBC WITH DIFFERENTIAL/PLATELET
Abs Immature Granulocytes: 0.02 10*3/uL (ref 0.00–0.07)
Basophils Absolute: 0 10*3/uL (ref 0.0–0.1)
Basophils Relative: 0 %
Eosinophils Absolute: 0.6 10*3/uL — ABNORMAL HIGH (ref 0.0–0.5)
Eosinophils Relative: 12 %
HCT: 34.5 % — ABNORMAL LOW (ref 39.0–52.0)
Hemoglobin: 11.1 g/dL — ABNORMAL LOW (ref 13.0–17.0)
Immature Granulocytes: 0 %
Lymphocytes Relative: 10 %
Lymphs Abs: 0.5 10*3/uL — ABNORMAL LOW (ref 0.7–4.0)
MCH: 31.1 pg (ref 26.0–34.0)
MCHC: 32.2 g/dL (ref 30.0–36.0)
MCV: 96.6 fL (ref 80.0–100.0)
Monocytes Absolute: 0.6 10*3/uL (ref 0.1–1.0)
Monocytes Relative: 12 %
Neutro Abs: 3.2 10*3/uL (ref 1.7–7.7)
Neutrophils Relative %: 66 %
Platelets: 172 10*3/uL (ref 150–400)
RBC: 3.57 MIL/uL — ABNORMAL LOW (ref 4.22–5.81)
RDW: 14.2 % (ref 11.5–15.5)
WBC: 4.9 10*3/uL (ref 4.0–10.5)
nRBC: 0 % (ref 0.0–0.2)

## 2022-11-09 LAB — COMPREHENSIVE METABOLIC PANEL
ALT: 17 U/L (ref 0–44)
AST: 23 U/L (ref 15–41)
Albumin: 3.5 g/dL (ref 3.5–5.0)
Alkaline Phosphatase: 71 U/L (ref 38–126)
Anion gap: 8 (ref 5–15)
BUN: 43 mg/dL — ABNORMAL HIGH (ref 8–23)
CO2: 25 mmol/L (ref 22–32)
Calcium: 8.5 mg/dL — ABNORMAL LOW (ref 8.9–10.3)
Chloride: 106 mmol/L (ref 98–111)
Creatinine, Ser: 2.16 mg/dL — ABNORMAL HIGH (ref 0.61–1.24)
GFR, Estimated: 30 mL/min — ABNORMAL LOW (ref >60)
Glucose, Bld: 86 mg/dL (ref 70–99)
Potassium: 4.6 mmol/L (ref 3.5–5.1)
Sodium: 139 mmol/L (ref 135–145)
Total Bilirubin: 0.5 mg/dL (ref 0.3–1.2)
Total Protein: 5.9 g/dL — ABNORMAL LOW (ref 6.5–8.1)

## 2022-11-09 LAB — KAPPA/LAMBDA LIGHT CHAINS
Kappa free light chain: 103.9 mg/L — ABNORMAL HIGH (ref 3.3–19.4)
Kappa, lambda light chain ratio: 11.67 — ABNORMAL HIGH (ref 0.26–1.65)
Lambda free light chains: 8.9 mg/L (ref 5.7–26.3)

## 2022-11-09 LAB — PSA: Prostatic Specific Antigen: 9.94 ng/mL — ABNORMAL HIGH (ref 0.00–4.00)

## 2022-11-09 MED ORDER — ACETAMINOPHEN 325 MG PO TABS
650.0000 mg | ORAL_TABLET | Freq: Once | ORAL | Status: AC
  Administered 2022-11-09: 650 mg via ORAL
  Filled 2022-11-09: qty 2

## 2022-11-09 MED ORDER — DEXAMETHASONE 4 MG PO TABS
20.0000 mg | ORAL_TABLET | Freq: Once | ORAL | Status: AC
  Administered 2022-11-09: 20 mg via ORAL
  Filled 2022-11-09: qty 5

## 2022-11-09 MED ORDER — HYDROXYZINE HCL 10 MG PO TABS: 10.0000 mg | ORAL_TABLET | Freq: Three times a day (TID) | ORAL | 1 refills | Status: DC | PRN

## 2022-11-09 MED ORDER — MONTELUKAST SODIUM 10 MG PO TABS
10.0000 mg | ORAL_TABLET | Freq: Once | ORAL | Status: AC
  Administered 2022-11-09: 10 mg via ORAL
  Filled 2022-11-09: qty 1

## 2022-11-09 MED ORDER — DARATUMUMAB-HYALURONIDASE-FIHJ 1800-30000 MG-UT/15ML ~~LOC~~ SOLN
1800.0000 mg | Freq: Once | SUBCUTANEOUS | Status: AC
  Administered 2022-11-09: 1800 mg via SUBCUTANEOUS
  Filled 2022-11-09: qty 15

## 2022-11-09 MED ORDER — DIPHENHYDRAMINE HCL 25 MG PO CAPS
50.0000 mg | ORAL_CAPSULE | Freq: Once | ORAL | Status: AC
  Administered 2022-11-09: 50 mg via ORAL
  Filled 2022-11-09: qty 2

## 2022-11-09 NOTE — Progress Notes (Signed)
Hematology/Oncology Progress note [phone] [phone]      Patient Care Team: Baxter Hire, MD as PCP - General (Internal Medicine) Noreene Filbert, MD as Radiation Oncologist (Radiation Oncology) Baxter Hire, MD (Internal Medicine) Lavonia Dana, MD as Consulting Physician (Nephrology) [name] Server, MD as Consulting Physician (Oncology)  ASSESSMENT & PLAN:   Cancer Staging  Multiple myeloma [facility]) Staging form: Plasma Cell Myeloma and Plasma Cell Disorders, AJCC 8th Edition - Clinical stage from 03/17/2022: RISS Stage III (Beta-2-microglobulin (mg/L): 6.9, Albumin (g/dL): 3.3, ISS: Stage III, High-risk cytogenetics: Present, LDH: Normal) - Signed by [name] Server, MD on 03/17/2022  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) Staging form: Prostate, AJCC 8th Edition - Clinical: Stage IVB (cTX, cNX, pM1, PSA: 7, Grade Group: 3) - Signed by [name] Server, MD on 10/12/2022  Urothelial carcinoma of distal ureter [name]) Staging form: Renal Pelvis and Ureter, AJCC 8th Edition - Clinical: Stage Unknown (cTX, cN0, cM0) - Signed by [name] Server, MD on 03/17/2022   Multiple myeloma ([name]) #Stage III IgA kappa multiple myeloma, myeloma FISH panel positive for 13q deletion, gain of 1q, t (4;14), high risk, not candidate for autologous bone marrow transplant. 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   Continue Acyclovir 225m BID  -Bone health, currently he does not have an active myeloma bone involvement.  Patient reports history of developing adverse reaction [vision loss] after Prolia treatments. -s/p  root canal.   Suggest patient to see ophthalmologist to clearance of startling Xgeva. 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  [facility] well as bladder high-grade papillary carcinoma. S/p palliative radiation. He had repeat cystocscopy recently.  He has [facility].  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 I discussed with pathology Dr.Rubinas and confirmed with her that the pathology is not urothelial origin.  PSA is elevated at 7 10/16/22 Firmagon loading dose.  Castration sensitive Stage IV prostate cancer, continue ADT. Switch to Eligard 470mQ6 months.  Androgen receptor blockers may offer additional benefits. Priority is given to his urothelial carcinoma treatment due to the highest acuity.    Anemia in stage 4 chronic kidney disease (HCC) Stable hemoglobin. monitor   Stage 4 chronic kidney disease (HCC) Avoid nephrotoxins.  Encourage oral hydration.   Encounter for antineoplastic chemotherapy Chemotherapy plan as listed above  Pruritus Improved.  Continue Atarax 1083m8h PRN Rx sent.   Refer to genetic counselor  Orders Placed This Encounter  Procedures   PSA    Standing Status:   Future    Number of Occurrences:   1    Standing Expiration Date:   11/10/2023    Follow-up Per LOS  All questions were answered. The patient knows to call the clinic with any problems, questions or concerns.  [name], [name] 11/09/2022    CHIEF COMPLAINTS/REASON FOR VISIT:  Multiple myeloma, high-grade urothelial carcinoma.metastatic prostate cancer.   HISTORY OF PRESENTING ILLNESS:  [name] [name] is a 81 y.o. male who was seen in consultation at the  request of [name] Server, MD presents for follow-up of multiple myeloma and high-grade urothelial carcinoma, and metastatic castration sensitive prostate cancer.   Oncology History  Multiple myeloma ([place])  03/02/2022 Imaging   PET showed No definitive signs of multiple myeloma by FDG PET.   Obstruction of the RIGHT ureter with soft tissue mass in the RIGHT pelvis showing increased metabolic activity suspicious first and foremost for urothelial neoplasm given location adjacent to RIGHT UVJ and affect upon the RIGHT ureter.   Little or no excretion of FDG on the RIGHT but still with uptake of FDG by renal parenchyma. Degree of hydronephrosis is not substantially changed but lack of FDG excretion on the RIGHT is compatible with secondary sign of physiologic significance of ureteral obstruction.   RIGHT pelvic sidewall uptake without visible lesion, could represent tiny lymph node not visible on noncontrast imaging.   Aortic atherosclerosis.  Pulmonary emphysema. Aortic Atherosclerosis and Emphysema    03/17/2022 Initial Diagnosis   Multiple myeloma ([place]) -Patient has progressively worsening kidney function.  He is scheduled for kidney biopsy. 01/26/2022, free kappa light chain 1058, lambda 15.3, light chain ratio 69. Protein electrophoresis showed restricted band-M spike migrating in the beta-1 globulin region. ANA negative.  ANCA negative. Random urine protein electrophoresis showed abnormal protein band detected in the gamma globulin And a second possible abnormal protein band that may represent monoclonal protein.   02/23/2022 multiple myeloma showed IgA 2883, M protein of 1.8, beta 2 microglobulin 6.9, kappa free light chain 1268.9, lambda 13.2, free light chain ratio 96.13.   CMP showed creatinine of 3.41, that is significantly worse than his baseline in June 2022. 02/28/2022, 24-hour urine protein electrophoresis showed M protein of 729 mg, IgA monoclonal kappa light chain. 03/01/2022, [facility]  kidney biopsy showed diffuse light chain tubulopathy, and light chain cast nephropathy, moderate interstitial fibrosis,4% global glomerular sclerosis and the marked atherosclerosis.  03/06/2022, patient underwent a bone marrow biopsy. Pathology showed hypercellular bone marrow with plasma cell neoplasm, representing 20% of all cells in the aspirate associated with prominent interstitial infiltrates and numerous predominantly small clusters in the clots in the biopsy sections.  The plasma cells display kappa light chain restriction consistent with plasma cell neoplasm. Normal cytogenetics, myeloma FISH panel positive for 13q deletion, gain of 1q, t (4;14)   03/17/2022 Cancer Staging   Staging form: Plasma Cell Myeloma and Plasma Cell Disorders, AJCC 8th Edition - Clinical stage from 03/17/2022: RISS Stage III (Beta-2-microglobulin (mg/L): 6.9, Albumin (g/dL): 3.3, ISS: Stage III, High-risk cytogenetics: Present, LDH: Normal) - Signed by [name] Server, MD on 03/17/2022 Stage prefix: Initial diagnosis Beta 2 microglobulin range (mg/L): Greater than or equal to 5.5 Albumin range (g/dL): Less than 3.5 Cytogenetics: t(4;14) translocation, Other mutation, 1q addition   03/24/2022 - 07/19/2022 Chemotherapy   Patient is on Treatment Plan : MYELOMA NEWLY DIAGNOSED Daratumumab + Dexamethasone Weekly (DaraRd) q28d     09/21/2022 Imaging   MRI lumbar spine wo contrast 1. 6 mm nodule in the cauda equina at the L4-5 level. 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 Recommend MRI of the lumbar spine with contrast for further evaluation. 2. 12 mm T2 hyperintense lesion along the inferior aspect of the S1 vertebral body. This may represent focal involvement of multiple myeloma. No other discrete lesions are present. No pathologic fracture is present. 3. Mild bilateral foraminal stenosis at L3-4 and  L4-5. 4. Left paramedian annular tear at L5-S1 near the left S1 nerve roots.    Urothelial carcinoma of distal ureter ([place])  03/02/2022 Imaging   PET showed No definitive signs of multiple myeloma by FDG PET.   Obstruction of the RIGHT ureter with soft tissue mass in the RIGHT pelvis showing increased metabolic activity suspicious first and foremost for urothelial neoplasm given location adjacent to RIGHT UVJ and affect upon the RIGHT ureter.   Little or no excretion of FDG on the RIGHT but still with uptake of FDG by renal parenchyma. Degree of hydronephrosis is not substantially changed but lack of FDG excretion on the RIGHT is compatible with secondary sign of physiologic significance of ureteral obstruction.   RIGHT pelvic sidewall uptake without visible lesion, could represent tiny lymph node not visible on noncontrast imaging.   Aortic atherosclerosis.  Pulmonary emphysema. Aortic Atherosclerosis and Emphysema    03/17/2022 Initial Diagnosis   Urothelial carcinoma of distal ureter ([place])  -03/14/2022, cystoscopy showed diffuse narrowing right distal ureter with hydroureter following up the distal ureter and extending proximally.  Right ureteroscopy showed nodular tumor distal ureter and a convincing area without tumor suspicious for extrinsic obstruction.s/p   right ureteral stent placement. Ureteral tumor showed a tiny fragments of fibrous tissue, interpretation limited by thermal and crush artifact.  Right distal ureter saline barbotage showed hight grade urothelial carcinoma.    03/17/2022 Cancer Staging   Staging form: Renal Pelvis and Ureter, AJCC 8th Edition - Clinical: Stage Unknown (cTX, cN0, cM0) - Signed by [name] Server, MD on 03/17/2022 Stage prefix: Initial diagnosis WHO/ISUP grade (low/high): High Grade Histologic grading system: 2 grade system   06/05/2022 Imaging   PET scan at [facility] showed 1.Interval placement of right-sided double-J ureteral stent with resolution of hydronephrosis and improved excretion of radiotracer.  2.There is persistent abnormal  hypermetabolic tissue extending posterior lateral from the right aspect of the bladder encasing the distal ureter/stent which measures approximately 5.1 x 4.2 (CT image 243).  3.There are similar linear areas of abnormal hypermetabolic tissue seen to extend more superiorly along the right posterior pelvic wall/peritoneum, and towards the sciatic notch (fused image 238). Linear configuration suggests possible perineural spread. Note that this soft tissue abuts and may encase pelvic vasculature including the external and internal iliac arteries.  4.NEW single subcentimeter but abnormal focus of FDG avidity in the right presacral region (CT image 231) measuring 1.6 cm representing new site of neoplastic disease   06/19/2022 - 08/10/2022 Radiation Therapy   Pelvis RT 06/19/22-08/21/22 Bladder RT 07/27/22- 08/20/22    09/18/2022 Imaging   PET scan at [facility] showed 1. Evidence of disease progression with multifocal new hypermetabolic lesions around the periphery of the liver as described above. The evaluation of these lesions is limited by significant misregistration between the PET and CT portions of the examination. Contrast-enhanced CT or MRI could be used for confirmation and/or guidance of biopsy as necessary. There is also a lung parenchymal nodule with new associated FDG uptake, also suspicious for a site of metastasis.   2. Although the right nephroureteral stent appears to be well positioned, the right kidney demonstrates slightly increased hydronephrosis with significant surrounding fat stranding and relatively delayed clearance of FDG. These findings could suggest stent malfunction or be related to upper urinary tract infection. Suggest clinical correlation with any patient signs or symptoms such as right flank pain, fever, and/or urinalysis findings.     09/21/2022 Imaging   MRI lumbar spine wo contrast 1. 6  mm nodule in the cauda equina at the L4-5 level. This likely represents a nerve sheath tumor  such as a schwannoma or neurofibroma. Recommend MRI of the lumbar spine with contrast for further evaluation. 2. 12 mm T2 hyperintense lesion along the inferior aspect of the S1 vertebral body. This may represent focal involvement of multiple myeloma. No other discrete lesions are present. No pathologic fracture is present. 3. Mild bilateral foraminal stenosis at L3-4 and L4-5. 4. Left paramedian annular tear at L5-S1 near the left S1 nerve roots.   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 male who has above history reviewed by me today presents for follow up visit for multiple myeloma and high-grade urothelial carcinoma, metastatic prostate cancer.  S/p Firmagon loading dose, tolerates well.  Recent cystoscopy at [facility] Pruritus, take Atarax, he requests a refill. No new complaints.   Review of Systems  Constitutional:  Positive for fatigue. Negative for appetite change, chills, fever and unexpected weight change.  HENT:   Negative for hearing loss and voice change.   Eyes:  Negative for eye problems and icterus.  Respiratory:  Negative for chest tightness, cough and shortness of breath.   Cardiovascular:  Negative for chest pain and leg swelling.  Gastrointestinal:  Negative for abdominal distention, abdominal pain and diarrhea.  Endocrine: Negative for hot flashes.  Genitourinary:  Positive for frequency. Negative for difficulty urinating and dysuria.   Musculoskeletal:  Negative for arthralgias.  Skin:  Negative for itching.       itching  Neurological:  Negative for  light-headedness and numbness.  Hematological:  Negative for adenopathy. Does not bruise/bleed easily.  Psychiatric/Behavioral:  Negative for confusion.      MEDICAL HISTORY:  Past Medical History:  Diagnosis Date   Age related osteoporosis    Anemia    Barrett's esophagus    Bradycardia    Cancer (HCC)    PROSTATE   Cataract    CKD (chronic kidney disease) stage 3, GFR 30-59 ml/min (HCC)    Colon polyps    DDD (degenerative disc disease), cervical    DDD (degenerative disc disease), lumbar    Femur fracture (HCC)    Right   Fundic gland polyposis of stomach    Fundic gland polyps of stomach, benign    Gastritis    GERD (gastroesophageal reflux disease)    Hematuria    Hemorrhage of rectum and anus    History of colon polyps    Insomnia    Lumbago    Osteopenia of the elderly    Skin cancer    Sleep apnea     SURGICAL HISTORY: Past Surgical History:  Procedure Laterality Date   BAND HEMORRHOIDECTOMY     COLONOSCOPY     COLONOSCOPY WITH PROPOFOL N/A 05/06/2018   Procedure: COLONOSCOPY WITH PROPOFOL;  Surgeon: Lollie Sails, MD;  Location: [facility] ENDOSCOPY;  Service: Endoscopy;  Laterality: N/A;   COLONOSCOPY WITH PROPOFOL N/A 09/23/2018   Procedure: COLONOSCOPY WITH PROPOFOL;  Surgeon: Lollie Sails, MD;  Location: [facility] ENDOSCOPY;  Service: Endoscopy;  Laterality: N/A;  COLONOSCOPY WITH PROPOFOL N/A 03/30/2021   Procedure: COLONOSCOPY WITH PROPOFOL;  Surgeon: Toledo, Benay Pike, MD;  Location: [facility] ENDOSCOPY;  Service: Gastroenterology;  Laterality: N/A;   CYSTOSCOPY W/ URETERAL STENT PLACEMENT Right 03/14/2022   Procedure: CYSTOSCOPY WITH RETROGRADE PYELOGRAM/URETERAL STENT PLACEMENT;  Surgeon: Abbie Sons, MD;  Location: [facility] ORS;  Service: Urology;  Laterality: Right;   ESOPHAGOGASTRODUODENOSCOPY (EGD) WITH PROPOFOL N/A 10/03/2016   Procedure: ESOPHAGOGASTRODUODENOSCOPY (EGD) WITH PROPOFOL;  Surgeon: Lollie Sails, MD;  Location: [facility] ENDOSCOPY;   Service: Endoscopy;  Laterality: N/A;   EYE SURGERY     CATARACTS   EYELID SURGERY     FLEXIBLE SIGMOIDOSCOPY     FRACTURE SURGERY Right 2016   [place]   FRACTURE SURGERY     ORIF DISTAL FEMUR FRACTURE     TONSILLECTOMY     TRANSURETHRAL RESECTION OF BLADDER TUMOR N/A 03/14/2022   Procedure: TRANSURETHRAL RESECTION OF BLADDER TUMOR (TURBT);  Surgeon: Abbie Sons, MD;  Location: [facility] ORS;  Service: Urology;  Laterality: N/A;    SOCIAL HISTORY: Social History   Socioeconomic History   Marital status: Married    Spouse name: Not on file   Number of children: Not on file   Years of education: Not on file   Highest education level: Not on file  Occupational History   Not on file  Tobacco Use   Smoking status: Former    Packs/day: 1.00    Years: 15.00    Total pack years: 15.00    Types: Cigarettes    Quit date: 10/03/1974    Years since quitting: 48.1   Smokeless tobacco: Never  Vaping Use   Vaping Use: Never used  Substance and Sexual Activity   Alcohol use: Not Currently    Alcohol/week: 1.0 standard drink of alcohol    Types: 1 Cans of beer per week    Comment: 1 beer every 2 weeks   Drug use: No   Sexual activity: Yes    Birth control/protection: None  Other Topics Concern   Not on file  Social History Narrative   ** Merged History Encounter **       Social Determinants of Health   Financial Resource Strain: Not on file  Food Insecurity: Not on file  Transportation Needs: Not on file  Physical Activity: Not on file  Stress: Not on file  Social Connections: Not on file  Intimate Partner Violence: Not on file    FAMILY HISTORY: Family History  Problem Relation Age of Onset   Breast cancer Mother        dx 35s-50s   Prostate cancer Paternal Grandfather 56   Breast cancer Cousin     ALLERGIES:  is allergic to demerol [meperidine hcl], gabapentin, prolia [denosumab], demerol [meperidine], and nsaids.  MEDICATIONS:  Current Outpatient Medications   Medication Sig Dispense Refill   acyclovir (ZOVIRAX) 200 MG capsule Take 1 capsule (200 mg total) by mouth 2 (two) times daily. 60 capsule 3   Calcium Carbonate (CALCIUM 500 PO) Take 1 tablet by mouth daily.     Cholecalciferol (VITAMIN D3) 125 MCG (5000 UT) CAPS Take 5,000 Units by mouth daily.     ferrous sulfate 325 (65 FE) MG tablet Take 325 mg by mouth daily.     ipratropium (ATROVENT) 0.03 % nasal spray Place 1 spray into both nostrils in the morning.     losartan (COZAAR) 25 MG tablet Take 25 mg by mouth daily.     melatonin 5 MG TABS  Take 5 mg by mouth at bedtime.     montelukast (SINGULAIR) 10 MG tablet Take 1 tablet (10 mg total) by mouth See admin instructions. Take 1 tablet the day prior to Daratumumab treatments, and take 1 tablet daily for 2 days after treatments 30 tablet 1   Multiple Vitamins-Minerals (MENS 50+ MULTIVITAMIN) TABS Take 1 tablet by mouth daily.     senna (SENOKOT) 8.6 MG TABS tablet Take 2 tablets (17.2 mg total) by mouth daily. 120 tablet 0   traZODone (DESYREL) 50 MG tablet Take 25 mg by mouth at bedtime.     triamcinolone cream (KENALOG) 0.1 % Apply 1 Application topically 2 (two) times daily.     trospium (SANCTURA) 20 MG tablet Take by mouth. Take 1 tablet (20 mg total) by mouth daily as needed (as needed for urinary urgency).     aspirin EC 81 MG tablet Take 1 tablet (81 mg total) by mouth daily. Swallow whole. (Patient not taking: Reported on 10/12/2022) 30 tablet 11   hydrOXYzine (ATARAX) 10 MG tablet Take 1 tablet (10 mg total) by mouth every 8 (eight) hours as needed for itching or anxiety. 30 tablet 1   trospium (SANCTURA) 20 MG tablet Take by mouth. (Patient not taking: Reported on 11/09/2022)     No current facility-administered medications for this visit.     PHYSICAL EXAMINATION: ECOG PERFORMANCE STATUS: 1 - Symptomatic but completely ambulatory Today's Vitals   11/09/22 0920  BP: (!) 121/53  Pulse: (!) 54  Temp: (!) 96 F (35.6 C)  TempSrc:  Tympanic  SpO2: 100%  Weight: 145 lb 9.6 oz (66 kg)  PainSc: 0-No pain   Body mass index is 22.8 kg/m.   Physical Exam Constitutional:      General: He is not in acute distress. HENT:     Head: Normocephalic and atraumatic.  Eyes:     General: No scleral icterus. Cardiovascular:     Rate and Rhythm: Normal rate and regular rhythm.  Pulmonary:     Effort: Pulmonary effort is normal. No respiratory distress.     Breath sounds: No wheezing.  Abdominal:     General: There is no distension.     Palpations: Abdomen is soft.  Musculoskeletal:        General: No deformity. Normal range of motion.     Cervical back: Normal range of motion and neck supple.  Skin:    General: Skin is warm and dry.     Findings: Rash present. No erythema.  Neurological:     Mental Status: He is alert and oriented to person, place, and time. Mental status is at baseline.     Cranial Nerves: No cranial nerve deficit.     Coordination: Coordination normal.  Psychiatric:        Mood and Affect: Mood normal.      LABORATORY DATA:  I have reviewed the data as listed    Latest Ref Rng & Units 11/09/2022    9:05 AM 11/08/2022   12:12 PM 10/30/2022   10:41 AM  CBC  WBC 4.0 - 10.5 K/uL 4.9  4.4  4.4   Hemoglobin 13.0 - 17.0 g/dL 11.1  11.4  11.5   Hematocrit 39.0 - 52.0 % 34.5  35.6  36.1   Platelets 150 - 400 K/uL 172  182  207        Latest Ref Rng & Units 11/09/2022    9:05 AM 11/08/2022   12:12 PM 10/30/2022   10:41 AM  CMP  Glucose 70 - 99 mg/dL 86  105  129   BUN 8 - 23 mg/dL 43  44  36   Creatinine 0.61 - 1.24 mg/dL 2.16  2.29  2.32   Sodium 135 - 145 mmol/L 139  140  137   Potassium 3.5 - 5.1 mmol/L 4.6  4.2  5.2   Chloride 98 - 111 mmol/L 106  106  107   CO2 22 - 32 mmol/L _0 Calcium 8.9 - 10.3 mg/dL 8.5  8.6  8.9   Total Protein 6.5 - 8.1 g/dL 5.9  5.9  6.2   Total Bilirubin 0.3 - 1.2 mg/dL 0.5  0.7  0.4   Alkaline Phos 38 - 126 U/L 71  76  66   AST 15 - 41 U/L _1 ALT 0 - 44 U/L _2 Iron/TIBC/Ferritin/ %Sat    Component Value Date/Time   IRON 60 02/23/2022 1523   TIBC 238 (L) 02/23/2022 1523   FERRITIN 86 02/23/2022 1523   IRONPCTSAT 25 02/23/2022 1523       RADIOGRAPHIC STUDIES: I have personally reviewed the radiological images as listed and agreed with the findings in the report.  US BIOPSY (LIVER)  Result Date: 10/06/2022 INDICATION: History of multiple myeloma, new liver lesions by PET-CT EXAM: ULTRASOUND CORE BIOPSY RIGHT INFERIOR LIVER LESION MEDICATIONS: 1% LIDOCAINE LOCAL ANESTHESIA/SEDATION: Moderate (conscious) sedation was employed during this procedure. A total of Versed 1.0 mg and Fentanyl 50 mcg was administered intravenously by the radiology nurse. Total intra-service moderate Sedation Time: 10 minutes. The patient's level of consciousness and vital signs were monitored continuously by radiology nursing throughout the procedure under my direct supervision. COMPLICATIONS: None immediate. PROCEDURE: Informed written consent was obtained from the patient after a thorough discussion of the procedural risks, benefits and alternatives. All questions were addressed. Maximal Sterile Barrier Technique was utilized including caps, mask, sterile gowns, sterile gloves, sterile drape, hand hygiene and skin antiseptic. A timeout was performed prior to the initiation of the procedure. previous imaging reviewed. preliminary ultrasound performed. a right inferior hepatic lesion was localized and marked for biopsy. under sterile conditions and local anesthesia, the 17 gauge 6.8 cm access was advanced from a lower intercostal approach to the lesion. needle position confirmed with ultrasound. images obtained for documentation. 2 18 gauge cores obtained. samples placed in formalin. needle tract occluded with gel-foam. postprocedure imaging demonstrates no hemorrhage or hematoma. patient tolerated biopsy well. IMPRESSION: Successful CT-guided core  biopsy of a right inferior hepatic lesion. Electronically Signed   By: Jerilynn Mages.  Shick M.D.   On: 10/06/2022 10:42   US Venous Img Upper Uni Right  Result Date: 09/22/2022 CLINICAL DATA:  81 year old male with a history of facial swelling EXAM: RIGHT UPPER EXTREMITY VENOUS DOPPLER ULTRASOUND TECHNIQUE: Gray-scale sonography with graded compression, as well as color Doppler and duplex ultrasound were performed to evaluate the upper extremity deep venous system from the level of the subclavian vein and including the jugular, axillary, basilic, radial, ulnar and upper cephalic vein. Spectral Doppler was utilized to evaluate flow at rest and with distal augmentation maneuvers. COMPARISON:  None Available. FINDINGS: Contralateral Subclavian Vein: Respiratory phasicity is normal and symmetric with the symptomatic side. No evidence of thrombus. Normal compressibility. Internal Jugular Vein: No evidence of thrombus. Normal compressibility, respiratory phasicity and response to augmentation. Subclavian Vein: No evidence of thrombus. Normal compressibility, respiratory phasicity and  response to augmentation. Axillary Vein: No evidence of thrombus. Normal compressibility, respiratory phasicity and response to augmentation. Cephalic Vein: No evidence of thrombus. Normal compressibility, respiratory phasicity and response to augmentation. Basilic Vein: No evidence of thrombus. Normal compressibility, respiratory phasicity and response to augmentation. Brachial Veins: No evidence of thrombus. Normal compressibility, respiratory phasicity and response to augmentation. Radial Veins: No evidence of thrombus. Normal compressibility, respiratory phasicity and response to augmentation. Ulnar Veins: No evidence of thrombus. Normal compressibility, respiratory phasicity and response to augmentation. Other Findings:  None visualized. IMPRESSION: Directed duplex of the right upper extremity negative for DVT Signed, Dulcy Fanny. Nadene Rubins, RPVI [facility] Radiology Electronically Signed   By: Corrie Mckusick D.O.   On: 09/22/2022 16:35   MR Lumbar Spine Wo Contrast  Result Date: 09/21/2022 CLINICAL DATA:  Low back pain. Multiple myeloma. Increased fracture risk. Pain extends into the left lower extremity. EXAM: MRI LUMBAR SPINE WITHOUT CONTRAST TECHNIQUE: Multiplanar, multisequence MR imaging of the lumbar spine was performed. No intravenous contrast was administered. COMPARISON:  CT of the abdomen and pelvis 02/07/2019. Whole-body bone scan 02/07/2019. FINDINGS: Segmentation: 5 non rib-bearing lumbar type vertebral bodies are present. The lowest fully formed vertebral body is L5. Alignment: No significant listhesis is present. Lumbar lordosis is preserved. Vertebrae: A T2 hyperintense lesion along the inferior aspect of the S1 vertebral body measures up to 12 mm. No other discrete marrow lesions are present. Vertebral body heights are normal. Conus medullaris and cauda equina: Conus extends to the T12-L1 level. Conus is within normal limits. A 6 mm nodule is present in the cauda equina at the L4-5 level. Nerve roots are otherwise within normal limits. Paraspinal and other soft tissues: Benign cysts of the liver are again noted. A simple cyst in the posterior aspect of the left kidney is stable measuring 27 mm. No other discrete solid organ lesions are present. No significant adenopathy is present. Disc levels: T12-L1: Insert normal disc level L1-2: Insert normal disc level L2-3: Mild disc desiccation is present. No significant herniation or stenosis is present. L3-4: A mild broad-based disc bulge is present. Disc extends into the foramina bilaterally. Mild bilateral foraminal stenosis is present. Central canal is patent. L4-5: Mild lateral disc bulging is present. Facet hypertrophy and short pedicles contribute to mild bilateral foraminal stenosis. L5-S1: A broad-based disc protrusion is  present. Left paramedian annular tear is present near the left S1 nerve roots. Disc material extends into the foramina bilaterally without significant stenosis. IMPRESSION: 1. 6 mm nodule in the cauda equina at the L4-5 level. 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 Recommend MRI of the lumbar spine with contrast for further evaluation. 2. 12 mm T2 hyperintense lesion along the inferior aspect of the S1 vertebral body. This may represent focal involvement of multiple myeloma. No other discrete lesions are present. No pathologic fracture is present. 3. Mild bilateral foraminal stenosis at L3-4 and L4-5. 4. Left paramedian annular tear at L5-S1 near the left S1 nerve roots. Electronically Signed   By: San Morelle M.D.   On: 09/21/2022 10:11

## 2022-11-09 NOTE — Assessment & Plan Note (Addendum)
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  Cystoscopy at [facility] showed ureter high-grade carcinoma as well as bladder high-grade papillary carcinoma. S/p palliative radiation. He had repeat cystocscopy recently.  He has upcoming appt with [facility] next week.  Possible need of starting [place] and Padcev in the near future

## 2022-11-09 NOTE — Assessment & Plan Note (Signed)
Stable hemoglobin. monitor 

## 2022-11-09 NOTE — Assessment & Plan Note (Signed)
Improved.  Continue Atarax '10mg'$  Q8h PRN Rx sent.

## 2022-11-09 NOTE — Assessment & Plan Note (Signed)
Avoid nephrotoxins.  Encourage oral hydration  

## 2022-11-09 NOTE — Assessment & Plan Note (Signed)
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 I discussed with pathology Dr.Rubinas and confirmed with her that the pathology is not urothelial origin.  PSA is elevated at 7 10/16/22 Firmagon loading dose.  Castration sensitive Stage IV prostate cancer, continue ADT. Switch to Eligard '45mg'$  Q6 months.  Androgen receptor blockers may offer additional benefits. Priority is given to his urothelial carcinoma treatment due to the highest acuity.

## 2022-11-09 NOTE — Assessment & Plan Note (Addendum)
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   Continue Acyclovir 253m BID  -Bone health, currently he does not have an active myeloma bone involvement.  Patient reports history of developing adverse reaction [vision loss] after Prolia treatments. -s/p  root canal.  Suggest patient to see ophthalmologist to clearance of startling Xgeva. .Marland Kitchen

## 2022-11-09 NOTE — Assessment & Plan Note (Signed)
Chemotherapy plan as listed above 

## 2022-11-12 ENCOUNTER — Other Ambulatory Visit: Payer: Self-pay | Admitting: Oncology

## 2022-11-12 DIAGNOSIS — C9000 Multiple myeloma not having achieved remission: Secondary | ICD-10-CM

## 2022-11-13 ENCOUNTER — Inpatient Hospital Stay: Payer: Medicare Other

## 2022-11-13 DIAGNOSIS — R768 Other specified abnormal immunological findings in serum: Secondary | ICD-10-CM

## 2022-11-13 DIAGNOSIS — Z5112 Encounter for antineoplastic immunotherapy: Secondary | ICD-10-CM | POA: Diagnosis not present

## 2022-11-13 DIAGNOSIS — N289 Disorder of kidney and ureter, unspecified: Secondary | ICD-10-CM

## 2022-11-13 DIAGNOSIS — C61 Malignant neoplasm of prostate: Secondary | ICD-10-CM

## 2022-11-13 LAB — MULTIPLE MYELOMA PANEL, SERUM
Albumin SerPl Elph-Mcnc: 3.4 g/dL (ref 2.9–4.4)
Albumin/Glob SerPl: 1.7 (ref 0.7–1.7)
Alpha 1: 0.3 g/dL (ref 0.0–0.4)
Alpha2 Glob SerPl Elph-Mcnc: 0.7 g/dL (ref 0.4–1.0)
B-Globulin SerPl Elph-Mcnc: 0.8 g/dL (ref 0.7–1.3)
Gamma Glob SerPl Elph-Mcnc: 0.4 g/dL (ref 0.4–1.8)
Globulin, Total: 2.1 g/dL — ABNORMAL LOW (ref 2.2–3.9)
IgA: 261 mg/dL (ref 61–437)
IgG (Immunoglobin G), Serum: 417 mg/dL — ABNORMAL LOW (ref 603–1613)
IgM (Immunoglobulin M), Srm: 32 mg/dL (ref 15–143)
M Protein SerPl Elph-Mcnc: 0.3 g/dL — ABNORMAL HIGH
Total Protein ELP: 5.5 g/dL — ABNORMAL LOW (ref 6.0–8.5)

## 2022-11-13 LAB — PSA: Prostatic Specific Antigen: 13.31 ng/mL — ABNORMAL HIGH (ref 0.00–4.00)

## 2022-11-13 MED ORDER — LEUPROLIDE ACETATE (6 MONTH) 45 MG ~~LOC~~ KIT
45.0000 mg | PACK | Freq: Once | SUBCUTANEOUS | Status: AC
  Administered 2022-11-13: 45 mg via SUBCUTANEOUS
  Filled 2022-11-13: qty 45

## 2022-11-20 ENCOUNTER — Other Ambulatory Visit: Payer: Self-pay

## 2022-11-20 ENCOUNTER — Ambulatory Visit: Payer: Self-pay | Admitting: Licensed Clinical Social Worker

## 2022-11-20 ENCOUNTER — Telehealth: Payer: Self-pay

## 2022-11-20 ENCOUNTER — Inpatient Hospital Stay: Payer: Medicare Other

## 2022-11-20 ENCOUNTER — Telehealth: Payer: Self-pay | Admitting: Licensed Clinical Social Worker

## 2022-11-20 ENCOUNTER — Encounter: Payer: Self-pay | Admitting: Licensed Clinical Social Worker

## 2022-11-20 ENCOUNTER — Other Ambulatory Visit: Payer: Self-pay | Admitting: *Deleted

## 2022-11-20 DIAGNOSIS — R3915 Urgency of urination: Secondary | ICD-10-CM

## 2022-11-20 DIAGNOSIS — C61 Malignant neoplasm of prostate: Secondary | ICD-10-CM

## 2022-11-20 DIAGNOSIS — Z1379 Encounter for other screening for genetic and chromosomal anomalies: Secondary | ICD-10-CM | POA: Insufficient documentation

## 2022-11-20 DIAGNOSIS — Z5112 Encounter for antineoplastic immunotherapy: Secondary | ICD-10-CM | POA: Diagnosis not present

## 2022-11-20 LAB — URINALYSIS, COMPLETE (UACMP) WITH MICROSCOPIC
Bilirubin Urine: NEGATIVE
Glucose, UA: NEGATIVE mg/dL
Ketones, ur: NEGATIVE mg/dL
Nitrite: NEGATIVE
Protein, ur: 100 mg/dL — AB
Specific Gravity, Urine: 1.015 (ref 1.005–1.030)
Squamous Epithelial / HPF: NONE SEEN (ref 0–5)
pH: 5 (ref 5.0–8.0)

## 2022-11-20 MED ORDER — ACYCLOVIR 200 MG PO CAPS: 200.0000 mg | ORAL_CAPSULE | Freq: Two times a day (BID) | ORAL | 3 refills | Status: DC

## 2022-11-20 NOTE — Telephone Encounter (Signed)
Pt called On call provider this past weekend with symptoms of urinary urgency and burning. Dr. Janese Banks sent rx for cipro to pharmacy.   Contacted pt today to follow up on symptoms and he states symptoms are better today and that he just started Cipro yesterday. Informed him that Dr Tasia Catchings wants him to provide a urine sample, so he will come this afternoon to give one.   Also infored him that Dr. Tasia Catchings has talked to Dr. Vito Berger and they agree that he should get a PSMA PET scan. He verbalized understanding.

## 2022-11-20 NOTE — Progress Notes (Signed)
HPI:   [name] [name] was previously seen in the [place] [facility] due to a personal and family history of cancer and concerns regarding a hereditary predisposition to cancer. Please refer to our prior cancer genetics clinic note for more information regarding our discussion, assessment and recommendations, at the time. [name] [name] recent genetic test results were disclosed to him, as were recommendations warranted by these results. These results and recommendations are discussed in more detail below.  CANCER HISTORY:  Oncology History  Multiple myeloma ([place])  03/02/2022 Imaging   PET showed No definitive signs of multiple myeloma by FDG PET.   Obstruction of the RIGHT ureter with soft tissue mass in the RIGHT pelvis showing increased metabolic activity suspicious first and foremost for urothelial neoplasm given location adjacent to RIGHT UVJ and affect upon the RIGHT ureter.   Little or no excretion of FDG on the RIGHT but still with uptake of FDG by renal parenchyma. Degree of hydronephrosis is not substantially changed but lack of FDG excretion on the RIGHT is compatible with secondary sign of physiologic significance of ureteral obstruction.   RIGHT pelvic sidewall uptake without visible lesion, could represent tiny lymph node not visible on noncontrast imaging.   Aortic atherosclerosis.  Pulmonary emphysema. Aortic Atherosclerosis and Emphysema    03/17/2022 Initial Diagnosis   Multiple myeloma ([place]) -Patient has progressively worsening kidney function.  He is scheduled for kidney biopsy. 01/26/2022, free kappa light chain 1058, lambda 15.3, light chain ratio 69. Protein electrophoresis showed restricted band-M spike migrating in the beta-1 globulin region. ANA negative.  ANCA negative. Random urine protein electrophoresis showed abnormal protein band detected in the gamma globulin And a second possible abnormal protein band that may represent monoclonal protein.   02/23/2022  multiple myeloma showed IgA 2883, M protein of 1.8, beta 2 microglobulin 6.9, kappa free light chain 1268.9, lambda 13.2, free light chain ratio 96.13.   CMP showed creatinine of 3.41, that is significantly worse than his baseline in June 2022. 02/28/2022, 24-hour urine protein electrophoresis showed M protein of 729 mg, IgA monoclonal kappa light chain. 03/01/2022, [facility] kidney biopsy showed diffuse light chain tubulopathy, and light chain cast nephropathy, moderate interstitial fibrosis,4% global glomerular sclerosis and the marked atherosclerosis.  03/06/2022, patient underwent a bone marrow biopsy. Pathology showed hypercellular bone marrow with plasma cell neoplasm, representing 20% of all cells in the aspirate associated with prominent interstitial infiltrates and numerous predominantly small clusters in the clots in the biopsy sections.  The plasma cells display kappa light chain restriction consistent with plasma cell neoplasm. Normal cytogenetics, myeloma FISH panel positive for 13q deletion, gain of 1q, t (4;14)   03/17/2022 Cancer Staging   Staging form: Plasma Cell Myeloma and Plasma Cell Disorders, AJCC 8th Edition - Clinical stage from 03/17/2022: RISS Stage III (Beta-2-microglobulin (mg/L): 6.9, Albumin (g/dL): 3.3, ISS: Stage III, High-risk cytogenetics: Present, LDH: Normal) - Signed by [name], MD on 03/17/2022 Stage prefix: Initial diagnosis Beta 2 microglobulin range (mg/L): Greater than or equal to 5.5 Albumin range (g/dL): Less than 3.5 Cytogenetics: t(4;14) translocation, Other mutation, 1q addition   03/24/2022 - 07/19/2022 Chemotherapy   Patient is on Treatment Plan : MYELOMA NEWLY DIAGNOSED Daratumumab + Dexamethasone Weekly (DaraRd) q28d     09/21/2022 Imaging   MRI lumbar spine wo contrast 1. 6 mm nodule in the cauda equina at the L4-5 level. 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 Recommend MRI of the lumbar spine with contrast  for further evaluation. 2. 12 mm T2 hyperintense lesion along the inferior aspect of the S1 vertebral body. This may represent focal involvement of multiple myeloma. No other discrete lesions are present. No pathologic fracture is present. 3. Mild bilateral foraminal stenosis at L3-4 and L4-5. 4. Left paramedian annular tear at L5-S1 near the left S1 nerve roots.   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   Obstruction of the RIGHT ureter with soft tissue mass in the RIGHT pelvis showing increased metabolic activity suspicious first and foremost for urothelial neoplasm given location adjacent to RIGHT UVJ and affect upon the RIGHT ureter.   Little or no excretion of FDG on the RIGHT but still with uptake of FDG by renal parenchyma. Degree of hydronephrosis is not substantially changed but lack of FDG excretion on the RIGHT is compatible with secondary sign of physiologic significance of ureteral obstruction.   RIGHT pelvic sidewall uptake without visible lesion, could represent tiny lymph node not visible on noncontrast imaging.   Aortic atherosclerosis.  Pulmonary emphysema. 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  Right ureteroscopy showed nodular tumor distal ureter and a convincing area without tumor suspicious for extrinsic obstruction.s/p   right ureteral stent placement. Ureteral tumor showed a tiny fragments of fibrous tissue, interpretation limited by thermal and crush artifact.  Right distal ureter saline barbotage showed hight grade urothelial carcinoma.    03/17/2022 Cancer Staging   Staging form: Renal Pelvis and Ureter, AJCC 8th Edition - Clinical: Stage Unknown (cTX, cN0, cM0)  - Signed by [name], MD on 03/17/2022 Stage prefix: Initial diagnosis WHO/ISUP grade (low/high): High Grade Histologic grading system: 2 grade system   06/05/2022 Imaging   PET scan at [facility] showed 1.Interval placement of right-sided double-J ureteral stent with resolution of hydronephrosis and improved excretion of radiotracer.  2.There is persistent abnormal hypermetabolic tissue extending posterior lateral from the right aspect of the bladder encasing the distal ureter/stent which measures approximately 5.1 x 4.2 (CT image 243).  3.There are similar linear areas of abnormal hypermetabolic tissue seen to extend more superiorly along the right posterior pelvic wall/peritoneum, and towards the sciatic notch (fused image 238). Linear configuration suggests possible perineural spread. Note that this soft tissue abuts and may encase pelvic vasculature including the external and internal iliac arteries.  4.NEW single subcentimeter but abnormal focus of FDG avidity in the right presacral region (CT image 231) measuring 1.6 cm representing new site of neoplastic disease   06/19/2022 - 08/10/2022 Radiation Therapy   Pelvis RT 06/19/22-08/21/22 Bladder RT 07/27/22- 08/20/22    09/18/2022 Imaging   PET scan at [facility] showed 1. Evidence of disease progression with multifocal new hypermetabolic lesions around the periphery of the liver as described above. The evaluation of these lesions is limited by significant misregistration between the PET and CT portions of the examination. Contrast-enhanced CT or MRI could be used for confirmation and/or guidance of biopsy as necessary. There is also a lung parenchymal nodule with new associated FDG uptake, also suspicious for a site of metastasis.   2. Although the right nephroureteral stent appears to be well positioned, the right kidney demonstrates slightly increased hydronephrosis with significant surrounding fat stranding and relatively delayed clearance of FDG. These  findings  could suggest stent malfunction or be related to upper urinary tract infection. Suggest clinical correlation with any patient signs or symptoms such as right flank pain, fever, and/or urinalysis findings.     09/21/2022 Imaging   MRI lumbar spine wo contrast 1. 6 mm nodule in the cauda equina at the L4-5 level. 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 Recommend MRI of the lumbar spine with contrast for further evaluation. 2. 12 mm T2 hyperintense lesion along the inferior aspect of the S1 vertebral body. This may represent focal involvement of multiple myeloma. No other discrete lesions are present. No pathologic fracture is present. 3. Mild bilateral foraminal stenosis at L3-4 and L4-5. 4. Left paramedian annular tear at L5-S1 near the left S1 nerve roots.    Genetic Testing   Negative genetic testing. No pathogenic variants identified on the [facility] Multi-Cancer+RNA panel. The report date is 11/19/2022.  The Multi-Cancer + RNA Panel offered by [facility] includes sequencing and/or deletion/duplication analysis of the following 70 genes:  AIP*, ALK, APC*, ATM*, AXIN2*, BAP1*, BARD1*, BLM*, BMPR1A*, BRCA1*, BRCA2*, BRIP1*, CDC73*, CDH1*, CDK4, CDKN1B*, CDKN2A, CHEK2*, CTNNA1*, DICER1*, EPCAM, EGFR, FH*, FLCN*, GREM1, HOXB13, KIT, LZTR1, MAX*, MBD4, MEN1*, MET, MITF, MLH1*, MSH2*, MSH3*, MSH6*, MUTYH*, NF1*, NF2*, NTHL1*, PALB2*, PDGFRA, PMS2*, POLD1*, POLE*, POT1*, PRKAR1A*, PTCH1*, PTEN*, RAD51C*, RAD51D*, RB1*, RET, SDHA*, SDHAF2*, SDHB*, SDHC*, SDHD*, SMAD4*, SMARCA4*, SMARCB1*, SMARCE1*, STK11*, SUFU*, TMEM127*, TP53*, TSC1*, TSC2*, VHL*. RNA analysis is performed for * genes.   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 No pathogenic variants identified on the [facility] Multi-Cancer+RNA panel. The report date is 11/19/2022.  The Multi-Cancer + RNA Panel offered by [facility] includes sequencing and/or deletion/duplication analysis of the following 70 genes:  AIP*, ALK, APC*, ATM*, AXIN2*, BAP1*, BARD1*, BLM*, BMPR1A*, BRCA1*, BRCA2*, BRIP1*, CDC73*, CDH1*, CDK4, CDKN1B*, CDKN2A, CHEK2*, CTNNA1*, DICER1*, EPCAM, EGFR, FH*, FLCN*, GREM1, HOXB13, KIT, LZTR1, MAX*, MBD4, MEN1*, MET, MITF, MLH1*, MSH2*, MSH3*, MSH6*, MUTYH*, NF1*, NF2*, NTHL1*, PALB2*, PDGFRA, PMS2*, POLD1*, POLE*, POT1*, PRKAR1A*, PTCH1*, PTEN*, RAD51C*, RAD51D*, RB1*, RET, SDHA*, SDHAF2*, SDHB*, SDHC*, SDHD*, SMAD4*, SMARCA4*, SMARCB1*, SMARCE1*, STK11*, SUFU*, TMEM127*, TP53*, TSC1*, TSC2*, VHL*. RNA analysis is performed for * genes.     FAMILY HISTORY:  We obtained a detailed, 4-generation family history.  Significant diagnoses are listed below: Family History  Problem Relation Age of Onset   Breast cancer Mother        dx 57s-50s   Prostate cancer Paternal Grandfather 67   Breast cancer Cousin    [name] [name] has 1 daughter, 42. He had 3 brothers, none have had cancer.   [name] [name]'s mother had breast cancer in her late 37s- early 48s but did not pass of it, she passed at 67. Patient's mother's first cousin had breast cancer and is living at 30, and another cousin had metastatic kidney cancer that he passed from.    [name] [name]'s father passed at 60. Patient's paternal grandfather had prostate cancer that he passed of at 49.    [name] [name] is unaware of previous family history of genetic  testing for hereditary cancer risks. There is no reported Ashkenazi Jewish ancestry. There is no known consanguinity.      GENETIC TEST RESULTS:  The [facility] Multi-Cancer+RNA Panel found no  pathogenic mutations.   The Multi-Cancer + RNA Panel offered by [facility] includes sequencing and/or deletion/duplication analysis of the following 70 genes:  AIP*, ALK, APC*, ATM*, AXIN2*, BAP1*, BARD1*, BLM*, BMPR1A*, BRCA1*, BRCA2*, BRIP1*, CDC73*, CDH1*, CDK4, CDKN1B*, CDKN2A, CHEK2*, CTNNA1*, DICER1*, EPCAM, EGFR, FH*, FLCN*, GREM1, HOXB13, KIT, LZTR1, MAX*, MBD4, MEN1*, MET, MITF, MLH1*, MSH2*, MSH3*, MSH6*, MUTYH*, NF1*, NF2*, NTHL1*, PALB2*, PDGFRA, PMS2*, POLD1*, POLE*, POT1*, PRKAR1A*, PTCH1*, PTEN*, RAD51C*, RAD51D*, RB1*, RET, SDHA*, SDHAF2*, SDHB*, SDHC*, SDHD*, SMAD4*, SMARCA4*, SMARCB1*, SMARCE1*, STK11*, SUFU*, TMEM127*, TP53*, TSC1*, TSC2*, VHL*. RNA analysis is performed for * genes.   The test report has been scanned into EPIC and is located under the Molecular Pathology section of the Results Review tab.  A portion of the result report is included below for reference. Genetic testing reported out on 11/19/2022.     Even though a pathogenic variant was not identified, possible explanations for the cancer in the family may include: There may be no hereditary risk for cancer in the family. The cancers in [name] [name] and/or his family may be sporadic/familial or due to other genetic and environmental factors. There may be a gene mutation in one of these genes that current testing methods cannot detect but that chance is small. There could be another gene that has not yet been discovered, or that we have not yet tested, that is responsible for the cancer diagnoses in the family.  It is also possible there is a hereditary cause for the cancer in the family that [name] [name] did not inherit.  Therefore, it is important to remain in touch with cancer genetics in the future so that we can continue to offer [name] [name] the most up to date genetic testing.   ADDITIONAL GENETIC TESTING:  We discussed with [name] [name] that his genetic testing was fairly extensive.  If there are additional relevant genes  identified to increase cancer risk that can be analyzed in the future, we would be happy to discuss and coordinate this testing at that time.    CANCER SCREENING RECOMMENDATIONS:  [name] [name] test result is considered negative (normal).  This means that we have not identified a hereditary cause for his personal and family history of cancer at this time.   An individual's cancer risk and medical management are not determined by genetic test results alone. Overall cancer risk assessment incorporates additional factors, including personal medical history, family history, and any available genetic information that may result in a personalized plan for cancer prevention and surveillance. Therefore, it is recommended he continue to follow the cancer management and screening guidelines provided by his oncology and primary healthcare provider.  RECOMMENDATIONS FOR FAMILY MEMBERS:   Since he did not inherit a identifiable mutation in a cancer predisposition gene included on this panel, his children could not have inherited a known mutation from him in one of these genes. Individuals in this family might be at some increased risk of developing cancer, over the general population risk, due to the family history of cancer.  Individuals in the family should notify their providers of the family history of cancer. We recommend women in this family have a yearly mammogram beginning at age 78, or 39 years younger than the earliest onset of cancer, an annual clinical breast exam, and perform monthly breast self-exams.  Family members should have colonoscopies by at age 56, or earlier, as recommended by their providers. Other members of the family may still carry a pathogenic variant in one of these genes that [name] [name] did not inherit. Based on the family history, we recommend his maternal relatives have genetic testing. He will let us know if we can be of any assistance in coordinating genetic counseling and/or testing for  this family member.    FOLLOW-UP:  Lastly, we discussed with [name] [name] that cancer genetics is a rapidly advancing field and it is possible that new genetic tests will be appropriate for him and/or his family members in the future. We encouraged him to remain in contact with cancer genetics on an annual basis so we can update his personal and family histories and let him know of advances in cancer genetics that may benefit this family.   Our contact number was provided. [name] [name] questions were answered to his satisfaction, and he knows he is welcome to call us at anytime with additional questions or concerns.    Faith Rogue, MS, [facility] Genetic Counselor [email or web address] Phone: [phone]

## 2022-11-20 NOTE — Telephone Encounter (Signed)
I contacted Mr. Santilli to discuss his genetic testing results. No pathogenic variants were identified in the 70 genes analyzed. Detailed clinic note to follow.   The test report has been scanned into EPIC and is located under the Molecular Pathology section of the Results Review tab.  A portion of the result report is included below for reference.      Faith Rogue, MS, [facility] Genetic Counselor [email or web address] Phone: [phone]

## 2022-11-23 ENCOUNTER — Telehealth: Payer: Self-pay | Admitting: *Deleted

## 2022-11-23 NOTE — Telephone Encounter (Signed)
Patient called reportng that he has been on Cipro since 12/17 and now he has a slight sore  throat, "URI symptoms" and he is treating himself with over the counter medications Guiafenisen DM and a nasal decongestant He wanted Dr Tasia Catchings to know what is going on woth him

## 2022-11-24 ENCOUNTER — Encounter: Payer: Self-pay | Admitting: Oncology

## 2022-11-24 NOTE — Telephone Encounter (Signed)
Call returned to patient and advised that he be tested for Flu and COVID as per Dr Tasia Catchings recommendation He agreed and said that he will go to a walk in clinic to be tested, I asked that he let us know results and he agreed

## 2022-11-28 ENCOUNTER — Encounter: Payer: Self-pay | Admitting: *Deleted

## 2022-11-28 ENCOUNTER — Inpatient Hospital Stay: Payer: Medicare Other

## 2022-11-28 ENCOUNTER — Ambulatory Visit
Admission: RE | Admit: 2022-11-28 | Discharge: 2022-11-28 | Disposition: A | Discharge status: Home / Self Care | Payer: Medicare Other | Attending: Medical Oncology | Admitting: Medical Oncology

## 2022-11-28 ENCOUNTER — Other Ambulatory Visit: Payer: Self-pay

## 2022-11-28 ENCOUNTER — Ambulatory Visit
Admission: RE | Admit: 2022-11-28 | Discharge: 2022-11-28 | Disposition: A | Discharge status: Home / Self Care | Payer: Medicare Other | Source: Ambulatory Visit | Attending: Medical Oncology | Admitting: Medical Oncology

## 2022-11-28 ENCOUNTER — Telehealth: Payer: Self-pay | Admitting: *Deleted

## 2022-11-28 ENCOUNTER — Encounter: Payer: Self-pay | Admitting: Medical Oncology

## 2022-11-28 ENCOUNTER — Other Ambulatory Visit: Payer: Self-pay | Admitting: *Deleted

## 2022-11-28 ENCOUNTER — Inpatient Hospital Stay (HOSPITAL_BASED_OUTPATIENT_CLINIC_OR_DEPARTMENT_OTHER): Payer: Medicare Other | Admitting: Medical Oncology

## 2022-11-28 VITALS — BP 126/45 | HR 56 | Temp 96.8°F | Resp 18

## 2022-11-28 VITALS — BP 130/51 | HR 58

## 2022-11-28 DIAGNOSIS — C9000 Multiple myeloma not having achieved remission: Secondary | ICD-10-CM | POA: Diagnosis not present

## 2022-11-28 DIAGNOSIS — N184 Chronic kidney disease, stage 4 (severe): Secondary | ICD-10-CM

## 2022-11-28 DIAGNOSIS — N390 Urinary tract infection, site not specified: Secondary | ICD-10-CM

## 2022-11-28 DIAGNOSIS — C787 Secondary malignant neoplasm of liver and intrahepatic bile duct: Secondary | ICD-10-CM | POA: Insufficient documentation

## 2022-11-28 DIAGNOSIS — R300 Dysuria: Secondary | ICD-10-CM

## 2022-11-28 DIAGNOSIS — C61 Malignant neoplasm of prostate: Secondary | ICD-10-CM | POA: Insufficient documentation

## 2022-11-28 DIAGNOSIS — R051 Acute cough: Secondary | ICD-10-CM

## 2022-11-28 DIAGNOSIS — C669 Malignant neoplasm of unspecified ureter: Secondary | ICD-10-CM | POA: Diagnosis not present

## 2022-11-28 DIAGNOSIS — R319 Hematuria, unspecified: Secondary | ICD-10-CM

## 2022-11-28 DIAGNOSIS — R310 Gross hematuria: Secondary | ICD-10-CM

## 2022-11-28 DIAGNOSIS — E860 Dehydration: Secondary | ICD-10-CM

## 2022-11-28 DIAGNOSIS — Z5112 Encounter for antineoplastic immunotherapy: Secondary | ICD-10-CM | POA: Diagnosis not present

## 2022-11-28 LAB — URINALYSIS, COMPLETE (UACMP) WITH MICROSCOPIC
Bacteria, UA: NONE SEEN
Bilirubin Urine: NEGATIVE
Glucose, UA: NEGATIVE mg/dL
Ketones, ur: NEGATIVE mg/dL
Nitrite: NEGATIVE
Protein, ur: 100 mg/dL — AB
RBC / HPF: >50 RBC/hpf — ABNORMAL HIGH (ref 0–5)
Specific Gravity, Urine: 1.024 (ref 1.005–1.030)
Squamous Epithelial / HPF: NONE SEEN (ref 0–5)
pH: 5 (ref 5.0–8.0)

## 2022-11-28 LAB — COMPREHENSIVE METABOLIC PANEL
ALT: 21 U/L (ref 0–44)
AST: 24 U/L (ref 15–41)
Albumin: 3.5 g/dL (ref 3.5–5.0)
Alkaline Phosphatase: 88 U/L (ref 38–126)
Anion gap: 6 (ref 5–15)
BUN: 40 mg/dL — ABNORMAL HIGH (ref 8–23)
CO2: 26 mmol/L (ref 22–32)
Calcium: 8.6 mg/dL — ABNORMAL LOW (ref 8.9–10.3)
Chloride: 105 mmol/L (ref 98–111)
Creatinine, Ser: 2.41 mg/dL — ABNORMAL HIGH (ref 0.61–1.24)
GFR, Estimated: 26 mL/min — ABNORMAL LOW (ref >60)
Glucose, Bld: 124 mg/dL — ABNORMAL HIGH (ref 70–99)
Potassium: 4.2 mmol/L (ref 3.5–5.1)
Sodium: 137 mmol/L (ref 135–145)
Total Bilirubin: 0.5 mg/dL (ref 0.3–1.2)
Total Protein: 6.1 g/dL — ABNORMAL LOW (ref 6.5–8.1)

## 2022-11-28 LAB — CBC WITH DIFFERENTIAL/PLATELET
Abs Immature Granulocytes: 0.02 10*3/uL (ref 0.00–0.07)
Basophils Absolute: 0 10*3/uL (ref 0.0–0.1)
Basophils Relative: 0 %
Eosinophils Absolute: 0.3 10*3/uL (ref 0.0–0.5)
Eosinophils Relative: 6 %
HCT: 34.6 % — ABNORMAL LOW (ref 39.0–52.0)
Hemoglobin: 11.5 g/dL — ABNORMAL LOW (ref 13.0–17.0)
Immature Granulocytes: 0 %
Lymphocytes Relative: 11 %
Lymphs Abs: 0.6 10*3/uL — ABNORMAL LOW (ref 0.7–4.0)
MCH: 31.2 pg (ref 26.0–34.0)
MCHC: 33.2 g/dL (ref 30.0–36.0)
MCV: 93.8 fL (ref 80.0–100.0)
Monocytes Absolute: 0.5 10*3/uL (ref 0.1–1.0)
Monocytes Relative: 10 %
Neutro Abs: 3.9 10*3/uL (ref 1.7–7.7)
Neutrophils Relative %: 73 %
Platelets: 198 10*3/uL (ref 150–400)
RBC: 3.69 MIL/uL — ABNORMAL LOW (ref 4.22–5.81)
RDW: 13.9 % (ref 11.5–15.5)
WBC: 5.4 10*3/uL (ref 4.0–10.5)
nRBC: 0 % (ref 0.0–0.2)

## 2022-11-28 MED ORDER — CEPHALEXIN 500 MG PO CAPS: 500.0000 mg | ORAL_CAPSULE | Freq: Two times a day (BID) | ORAL | 0 refills | Status: AC

## 2022-11-28 MED ORDER — DEXAMETHASONE SODIUM PHOSPHATE 10 MG/ML IJ SOLN
4.0000 mg | Freq: Once | INTRAMUSCULAR | Status: AC
  Administered 2022-11-28: 4 mg via INTRAVENOUS
  Filled 2022-11-28: qty 1

## 2022-11-28 MED ORDER — PREDNISONE 10 MG (21) PO TBPK: ORAL_TABLET | ORAL | 0 refills | Status: DC

## 2022-11-28 MED ORDER — SODIUM CHLORIDE 0.9 % IV SOLN
INTRAVENOUS | Status: DC
  Filled 2022-11-28 (×2): qty 250

## 2022-11-28 NOTE — Telephone Encounter (Signed)
Patient called reporting that he still has a UTI after 7 days of antibiotics and that he needs something done about his URI because his cough is worse. Please advise

## 2022-11-28 NOTE — Progress Notes (Signed)
Symptom Management [place] at [facility] Telephone:([phone]) [phone] Fax:(336) [phone]  Patient Care Team: Baxter Hire, MD as PCP - General (Internal Medicine) Noreene Filbert, MD as Radiation Oncologist (Radiation Oncology) Baxter Hire, MD (Internal Medicine) Lavonia Dana, MD as Consulting Physician (Nephrology) [name], MD as Consulting Physician (Oncology)   Name of the patient: [name] [name]  [id number]  02/26/1941   Date of visit: 11/28/22  Reason for Consult: [name] [name] is a 81 y.o. 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 Has history of multiple replaced stents of right ureter which is managed by [facility]. Last stent placed at the end of November 2023. Very recently finished a course of Cipro for his symptoms of dysuria that remain today. He continues to have dysuria, hematuria, frequency and urgency of urination. He denies abdominal pain other than discomfort in the area of his stent of his ureter. No fevers, vomiting. He has tried withholding water intake for symptoms with little relief. Of note he also has a cough which is described as "deep" and mostly non-productive which he has had for the past few days. Negative COVID-19 testing. He has not taken anything for symptoms.    PAST MEDICAL HISTORY: Past Medical History:  Diagnosis Date   Age related osteoporosis    Anemia    Barrett's esophagus    Bradycardia    Cancer (HCC)    PROSTATE   Cataract    CKD (chronic kidney disease) stage 3, GFR 30-59 ml/min (HCC)    Colon polyps    DDD (degenerative disc disease), cervical    DDD (degenerative disc disease), lumbar    Femur fracture (HCC)    Right   Fundic gland polyposis of stomach    Fundic gland  polyps of stomach, benign    Gastritis    GERD (gastroesophageal reflux disease)    Hematuria    Hemorrhage of rectum and anus    History of colon polyps    Insomnia    Lumbago    Osteopenia of the elderly    Skin cancer    Sleep apnea     PAST SURGICAL HISTORY:  Past Surgical History:  Procedure Laterality Date   BAND HEMORRHOIDECTOMY     COLONOSCOPY     COLONOSCOPY WITH PROPOFOL N/A 05/06/2018   Procedure: COLONOSCOPY WITH PROPOFOL;  Surgeon: Lollie Sails, MD;  Location: [facility] ENDOSCOPY;  Service: Endoscopy;  Laterality: N/A;   COLONOSCOPY WITH PROPOFOL N/A 09/23/2018   Procedure: COLONOSCOPY WITH PROPOFOL;  Surgeon: Lollie Sails, MD;  Location: [facility] ENDOSCOPY;  Service: Endoscopy;  Laterality: N/A;   COLONOSCOPY WITH PROPOFOL N/A 03/30/2021   Procedure: COLONOSCOPY WITH PROPOFOL;  Surgeon: Toledo, Benay Pike, MD;  Location: [facility] ENDOSCOPY;  Service: Gastroenterology;  Laterality: N/A;   CYSTOSCOPY W/ URETERAL STENT PLACEMENT Right 03/14/2022   Procedure: CYSTOSCOPY WITH RETROGRADE PYELOGRAM/URETERAL STENT PLACEMENT;  Surgeon: Abbie Sons, MD;  Location: [facility] ORS;  Service: Urology;  Laterality: Right;   ESOPHAGOGASTRODUODENOSCOPY (EGD) WITH PROPOFOL N/A 10/03/2016   Procedure: ESOPHAGOGASTRODUODENOSCOPY (EGD) WITH PROPOFOL;  Surgeon: Lollie Sails, MD;  Location: [facility] ENDOSCOPY;  Service: Endoscopy;  Laterality: N/A;   EYE SURGERY     CATARACTS   EYELID SURGERY     FLEXIBLE SIGMOIDOSCOPY  FRACTURE SURGERY Right 2016   femur   FRACTURE SURGERY     ORIF DISTAL FEMUR FRACTURE     TONSILLECTOMY     TRANSURETHRAL RESECTION OF BLADDER TUMOR N/A 03/14/2022   Procedure: TRANSURETHRAL RESECTION OF BLADDER TUMOR (TURBT);  Surgeon: Abbie Sons, MD;  Location: [facility] ORS;  Service: Urology;  Laterality: N/A;    HEMATOLOGY/ONCOLOGY HISTORY:  Oncology History  Multiple myeloma ([place])  03/02/2022 Imaging   PET showed No definitive signs of multiple myeloma by  FDG PET.   Obstruction of the RIGHT ureter with soft tissue mass in the RIGHT pelvis showing increased metabolic activity suspicious first and foremost for urothelial neoplasm given location adjacent to RIGHT UVJ and affect upon the RIGHT ureter.   Little or no excretion of FDG on the RIGHT but still with uptake of FDG by renal parenchyma. Degree of hydronephrosis is not substantially changed but lack of FDG excretion on the RIGHT is compatible with secondary sign of physiologic significance of ureteral obstruction.   RIGHT pelvic sidewall uptake without visible lesion, could represent tiny lymph node not visible on noncontrast imaging.   Aortic atherosclerosis.  Pulmonary emphysema. Aortic Atherosclerosis and Emphysema    03/17/2022 Initial Diagnosis   Multiple myeloma ([place]) -Patient has progressively worsening kidney function.  He is scheduled for kidney biopsy. 01/26/2022, free kappa light chain 1058, lambda 15.3, light chain ratio 69. Protein electrophoresis showed restricted band-M spike migrating in the beta-1 globulin region. ANA negative.  ANCA negative. Random urine protein electrophoresis showed abnormal protein band detected in the gamma globulin And a second possible abnormal protein band that may represent monoclonal protein.   02/23/2022 multiple myeloma showed IgA 2883, M protein of 1.8, beta 2 microglobulin 6.9, kappa free light chain 1268.9, lambda 13.2, free light chain ratio 96.13.   CMP showed creatinine of 3.41, that is significantly worse than his baseline in June 2022. 02/28/2022, 24-hour urine protein electrophoresis showed M protein of 729 mg, IgA monoclonal kappa light chain. 03/01/2022, [facility] kidney biopsy showed diffuse light chain tubulopathy, and light chain cast nephropathy, moderate interstitial fibrosis,4% global glomerular sclerosis and the marked atherosclerosis.  03/06/2022, patient underwent a bone marrow biopsy. Pathology showed hypercellular bone marrow with  plasma cell neoplasm, representing 20% of all cells in the aspirate associated with prominent interstitial infiltrates and numerous predominantly small clusters in the clots in the biopsy sections.  The plasma cells display kappa light chain restriction consistent with plasma cell neoplasm. Normal cytogenetics, myeloma FISH panel positive for 13q deletion, gain of 1q, t (4;14)   03/17/2022 Cancer Staging   Staging form: Plasma Cell Myeloma and Plasma Cell Disorders, AJCC 8th Edition - Clinical stage from 03/17/2022: RISS Stage III (Beta-2-microglobulin (mg/L): 6.9, Albumin (g/dL): 3.3, ISS: Stage III, High-risk cytogenetics: Present, LDH: Normal) - Signed by [name], MD on 03/17/2022 Stage prefix: Initial diagnosis Beta 2 microglobulin range (mg/L): Greater than or equal to 5.5 Albumin range (g/dL): Less than 3.5 Cytogenetics: t(4;14) translocation, Other mutation, 1q addition   03/24/2022 - 07/19/2022 Chemotherapy   Patient is on Treatment Plan : MYELOMA NEWLY DIAGNOSED Daratumumab + Dexamethasone Weekly (DaraRd) q28d     09/21/2022 Imaging   MRI lumbar spine wo contrast 1. 6 mm nodule in the cauda equina at the L4-5 level. 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 Recommend MRI of the lumbar spine with contrast for further evaluation. 2. 12 mm T2 hyperintense lesion along the inferior aspect of the S1 vertebral  body. This may represent focal involvement of multiple myeloma. No other discrete lesions are present. No pathologic fracture is present. 3. Mild bilateral foraminal stenosis at L3-4 and L4-5. 4. Left paramedian annular tear at L5-S1 near the left S1 nerve roots.   Urothelial carcinoma of distal ureter ([place])  03/02/2022 Imaging   PET showed No definitive signs of multiple myeloma by FDG PET.   Obstruction of the RIGHT ureter with soft tissue mass in the RIGHT pelvis showing increased metabolic activity suspicious first and foremost for urothelial  neoplasm given location adjacent to RIGHT UVJ and affect upon the RIGHT ureter.   Little or no excretion of FDG on the RIGHT but still with uptake of FDG by renal parenchyma. Degree of hydronephrosis is not substantially changed but lack of FDG excretion on the RIGHT is compatible with secondary sign of physiologic significance of ureteral obstruction.   RIGHT pelvic sidewall uptake without visible lesion, could represent tiny lymph node not visible on noncontrast imaging.   Aortic atherosclerosis.  Pulmonary emphysema. Aortic Atherosclerosis and Emphysema    03/17/2022 Initial Diagnosis   Urothelial carcinoma of distal ureter ([place])  -03/14/2022, cystoscopy showed diffuse narrowing right distal ureter with hydroureter following up the distal ureter and extending proximally.  Right ureteroscopy showed nodular tumor distal ureter and a convincing area without tumor suspicious for extrinsic obstruction.s/p   right ureteral stent placement. Ureteral tumor showed a tiny fragments of fibrous tissue, interpretation limited by thermal and crush artifact.  Right distal ureter saline barbotage showed hight grade urothelial carcinoma.    03/17/2022 Cancer Staging   Staging form: Renal Pelvis and Ureter, AJCC 8th Edition - Clinical: Stage Unknown (cTX, cN0, cM0) - Signed by [name], MD on 03/17/2022 Stage prefix: Initial diagnosis WHO/ISUP grade (low/high): High Grade Histologic grading system: 2 grade system   06/05/2022 Imaging   PET scan at [facility] showed 1.Interval placement of right-sided double-J ureteral stent with resolution of hydronephrosis and improved excretion of radiotracer.  2.There is persistent abnormal hypermetabolic tissue extending posterior lateral from the right aspect of the bladder encasing the distal ureter/stent which measures approximately 5.1 x 4.2 (CT image 243).  3.There are similar linear areas of abnormal hypermetabolic tissue seen to extend more superiorly along the right  posterior pelvic wall/peritoneum, and towards the sciatic notch (fused image 238). Linear configuration suggests possible perineural spread. Note that this soft tissue abuts and may encase pelvic vasculature including the external and internal iliac arteries.  4.NEW single subcentimeter but abnormal focus of FDG avidity in the right presacral region (CT image 231) measuring 1.6 cm representing new site of neoplastic disease   06/19/2022 - 08/10/2022 Radiation Therapy   Pelvis RT 06/19/22-08/21/22 Bladder RT 07/27/22- 08/20/22    09/18/2022 Imaging   PET scan at [facility] showed 1. Evidence of disease progression with multifocal new hypermetabolic lesions around the periphery of the liver as described above. The evaluation of these lesions is limited by significant misregistration between the PET and CT portions of the examination. Contrast-enhanced CT or MRI could be used for confirmation and/or guidance of biopsy as necessary. There is also a lung parenchymal nodule with new associated FDG uptake, also suspicious for a site of metastasis.   2. Although the right nephroureteral stent appears to be well positioned, the right kidney demonstrates slightly increased hydronephrosis with significant surrounding fat stranding and relatively delayed clearance of FDG. These findings could suggest stent malfunction or be related to upper urinary tract infection. Suggest clinical correlation with any  patient signs or symptoms such as right flank pain, fever, and/or urinalysis findings.     09/21/2022 Imaging   MRI lumbar spine wo contrast 1. 6 mm nodule in the cauda equina at the L4-5 level. 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 Recommend MRI of the lumbar spine with contrast for further evaluation. 2. 12 mm T2 hyperintense lesion along the inferior aspect of the S1 vertebral body. This may represent focal involvement of multiple myeloma. No other discrete lesions are present.  No pathologic fracture is present. 3. Mild bilateral foraminal stenosis at L3-4 and L4-5. 4. Left paramedian annular tear at L5-S1 near the left S1 nerve roots.    Genetic Testing   Negative genetic testing. No pathogenic variants identified on the [facility] Multi-Cancer+RNA panel. The report date is 11/19/2022.  The Multi-Cancer + RNA Panel offered by [facility] includes sequencing and/or deletion/duplication analysis of the following 70 genes:  AIP*, ALK, APC*, ATM*, AXIN2*, BAP1*, BARD1*, BLM*, BMPR1A*, BRCA1*, BRCA2*, BRIP1*, CDC73*, CDH1*, CDK4, CDKN1B*, CDKN2A, CHEK2*, CTNNA1*, DICER1*, EPCAM, EGFR, FH*, FLCN*, GREM1, HOXB13, KIT, LZTR1, MAX*, MBD4, MEN1*, MET, MITF, MLH1*, MSH2*, MSH3*, MSH6*, MUTYH*, NF1*, NF2*, NTHL1*, PALB2*, PDGFRA, PMS2*, POLD1*, POLE*, POT1*, PRKAR1A*, PTCH1*, PTEN*, RAD51C*, RAD51D*, RB1*, RET, SDHA*, SDHAF2*, SDHB*, SDHC*, SDHD*, SMAD4*, SMARCA4*, SMARCB1*, SMARCE1*, STK11*, SUFU*, TMEM127*, TP53*, TSC1*, TSC2*, VHL*. RNA analysis is performed for * genes.   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 No pathogenic variants identified on the [facility] Multi-Cancer+RNA panel. The report date is 11/19/2022.  The Multi-Cancer + RNA Panel offered by [facility] includes sequencing and/or deletion/duplication analysis of the following 70 genes:  AIP*, ALK, APC*, ATM*, AXIN2*, BAP1*, BARD1*, BLM*, BMPR1A*, BRCA1*, BRCA2*, BRIP1*, CDC73*, CDH1*, CDK4, CDKN1B*, CDKN2A, CHEK2*, CTNNA1*, DICER1*, EPCAM, EGFR, FH*, FLCN*, GREM1, HOXB13, KIT, LZTR1, MAX*, MBD4, MEN1*, MET, MITF, MLH1*, MSH2*,  MSH3*, MSH6*, MUTYH*, NF1*, NF2*, NTHL1*, PALB2*, PDGFRA, PMS2*, POLD1*, POLE*, POT1*, PRKAR1A*, PTCH1*, PTEN*, RAD51C*, RAD51D*, RB1*, RET, SDHA*, SDHAF2*, SDHB*, SDHC*, SDHD*, SMAD4*, SMARCA4*, SMARCB1*, SMARCE1*, STK11*, SUFU*, TMEM127*, TP53*, TSC1*, TSC2*, VHL*. RNA analysis is performed for * genes.     ALLERGIES:  is allergic to demerol [meperidine hcl], gabapentin, prolia [denosumab], demerol [meperidine], and nsaids.  MEDICATIONS:  Current Outpatient Medications  Medication Sig Dispense Refill   acyclovir (ZOVIRAX) 200 MG capsule Take 1 capsule (200 mg total) by mouth 2 (two) times daily. 60 capsule 3   Calcium Carbonate (CALCIUM 500 PO) Take 1 tablet by mouth daily.     cephALEXin (KEFLEX) 500 MG capsule Take 1 capsule (500 mg total) by mouth 2 (two) times daily for 7 days. 14 capsule 0   Cholecalciferol (VITAMIN D3) 125 MCG (5000 UT) CAPS Take 5,000 Units by mouth daily.     ferrous sulfate 325 (65 FE) MG tablet Take 325 mg by mouth daily.     ipratropium (ATROVENT) 0.03 % nasal spray Place 1 spray into both nostrils in the morning.     losartan (COZAAR) 25 MG tablet Take 25 mg by mouth daily.     melatonin 5 MG TABS Take  5 mg by mouth at bedtime.     montelukast (SINGULAIR) 10 MG tablet Take 1 tablet (10 mg total) by mouth See admin instructions. Take 1 tablet the day prior to Daratumumab treatments, and take 1 tablet daily for 2 days after treatments 30 tablet 1   Multiple Vitamins-Minerals (MENS 50+ MULTIVITAMIN) TABS Take 1 tablet by mouth daily.     predniSONE (STERAPRED UNI-PAK 21 TAB) 10 MG (21) TBPK tablet Use as directed. 1 each 0   senna (SENOKOT) 8.6 MG TABS tablet Take 2 tablets (17.2 mg total) by mouth daily. 120 tablet 0   traZODone (DESYREL) 50 MG tablet Take 25 mg by mouth at bedtime.     triamcinolone cream (KENALOG) 0.1 % Apply 1 Application topically 2 (two) times daily.     trospium (SANCTURA) 20 MG tablet Take by mouth. Take 1 tablet (20 mg total) by mouth daily  as needed (as needed for urinary urgency).     aspirin EC 81 MG tablet Take 1 tablet (81 mg total) by mouth daily. Swallow whole. (Patient not taking: Reported on 10/12/2022) 30 tablet 11   hydrOXYzine (ATARAX) 10 MG tablet Take 1 tablet (10 mg total) by mouth every 8 (eight) hours as needed for itching or anxiety. (Patient not taking: Reported on 11/28/2022) 30 tablet 1   No current facility-administered medications for this visit.   Facility-Administered Medications Ordered in Other Visits  Medication Dose Route Frequency Provider Last Rate Last Admin   0.9 %  sodium chloride infusion   Intravenous Continuous Hughie Closs, [place] 999 mL/hr at 11/28/22 1358 New Bag at 11/28/22 1358   dexamethasone (DECADRON) injection 4 mg  4 mg Intravenous Once Samira Acero M, PA-C        VITAL SIGNS: BP (!) 126/45   Pulse (!) 56   Temp (!) 96.8 F (36 C) (Tympanic)   Resp 18   SpO2 98%  There were no vitals filed for this visit.  Estimated body mass index is 22.8 kg/m as calculated from the following:   Height as of 10/06/22: _0  (1.702 m).   Weight as of 11/09/22: 145 lb 9.6 oz (66 kg).  LABS: CBC:    Component Value Date/Time   WBC 5.4 11/28/2022 1222   HGB 11.5 (L) 11/28/2022 1222   HGB 14.4 03/25/2015 0531   HCT 34.6 (L) 11/28/2022 1222   HCT 44.3 03/25/2015 0531   PLT 198 11/28/2022 1222   PLT 142 (L) 03/25/2015 0531   MCV 93.8 11/28/2022 1222   MCV 88 03/25/2015 0531   NEUTROABS 3.9 11/28/2022 1222   NEUTROABS 6.3 03/25/2015 0531   LYMPHSABS 0.6 (L) 11/28/2022 1222   LYMPHSABS 0.8 (L) 03/25/2015 0531   MONOABS 0.5 11/28/2022 1222   MONOABS 0.8 03/25/2015 0531   EOSABS 0.3 11/28/2022 1222   EOSABS 0.4 03/25/2015 0531   BASOSABS 0.0 11/28/2022 1222   BASOSABS 0.0 03/25/2015 0531   Comprehensive Metabolic Panel:    Component Value Date/Time   NA 137 11/28/2022 1222   NA 139 03/24/2015 0645   K 4.2 11/28/2022 1222   K 4.4 03/24/2015 0645   CL 105 11/28/2022 1222    CL 108 03/24/2015 0645   CO2 26 11/28/2022 1222   CO2 24 03/24/2015 0645   BUN 40 (H) 11/28/2022 1222   BUN 20 03/24/2015 0645   CREATININE 2.41 (H) 11/28/2022 1222   CREATININE 1.24 03/24/2015 0645   GLUCOSE 124 (H) 11/28/2022 1222   GLUCOSE 113 (H) 03/24/2015 0645  CALCIUM 8.6 (L) 11/28/2022 1222   CALCIUM 8.2 (L) 03/24/2015 0645   AST 24 11/28/2022 1222   AST 26 03/22/2015 1108   ALT 21 11/28/2022 1222   ALT 21 03/22/2015 1108   ALKPHOS 88 11/28/2022 1222   ALKPHOS 70 03/22/2015 1108   BILITOT 0.5 11/28/2022 1222   BILITOT 0.8 03/22/2015 1108   PROT 6.1 (L) 11/28/2022 1222   PROT 6.9 03/22/2015 1108   ALBUMIN 3.5 11/28/2022 1222   ALBUMIN 3.9 03/22/2015 1108    RADIOGRAPHIC STUDIES: DG Chest 2 View  Result Date: 11/28/2022 CLINICAL DATA:  Cough.  Metastatic prostate cancer. EXAM: CHEST - 2 VIEW COMPARISON:  06/21/2022 FINDINGS: Heart size is normal. Mild aortic tortuosity. Bronchial thickening suggesting bronchitis. No consolidation, collapse or effusion. No acute bone finding. IMPRESSION: Bronchitis pattern. No consolidation or collapse. Electronically Signed   By: Nelson Chimes M.D.   On: 11/28/2022 12:19    PERFORMANCE STATUS (ECOG) : 1 - Symptomatic but completely ambulatory  Review of Systems Unless otherwise noted, a complete review of systems is negative.  Physical Exam General: NAD Cardiovascular: regular rate and rhythm Pulmonary: clear ant fields though lungs sound irritated throughout.  Abdomen: soft, nontender, no CVA tenderness and no palpable hernias, + bowel sounds GU: no suprapubic tenderness Extremities: no edema, no joint deformities Skin: no rashes Neurological: Weakness but otherwise nonfocal  Assessment and Plan- Patient is a 81 y.o. male  Encounter Diagnoses  Name Primary?   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 Cipro did not provide relief. No culture to examine. Recollection today shows significant leukocytes and blood though not many bacteria. Given his history and status I feel that it would be best to cover him with another antibiotic while we await urine culture results from today while also starting a steroid in case he is having renal colic. Given CKD NSAIDS not ideal. Does not request pain medication. Will see if steroids for his cough improve his symptoms of dysuria. KUB and renal study in the meantime to rule out hydronephrosis or nephrolithiasis. Discussed red flags.   Creatinine Clearance is 22- dose adjusted keflex taken into account.   Cough: New. Chest x ray shows bronchitis. Treating with decadcron in office and prednisone. If this does not improve symptoms he may benefit from an inhaler. Reviewed red flags. Follow up PRN.   Disposition:  Decadron 48m here today along with 500 ML IVF Medrol dose pack to start tomorrow Keflex 500 mg BID  KUB/Renal UKoreato be completed within the next few days [facility] follow up given symptoms RTC as needed before his next visit with Dr. YTasia Catchingson 12/04/2022  Patient expressed understanding and was in agreement with this plan. He also understands that He can call clinic at any time with any questions, concerns, or complaints.   Thank you for allowing me to participate in the care of this very pleasant patient.   Time Total: 15  Visit consisted of counseling and education dealing with the complex and emotionally intense issues of symptom management in the setting of serious illness.Greater than 50%  of this time was spent  counseling and coordinating care related to the above assessment and plan.  Signed by: Nelwyn [place]is[place], PA-C

## 2022-11-28 NOTE — Telephone Encounter (Signed)
Pt contacted to discuss his concerns. He is reporting a 'bad cough' started on 12/18. He is using mucous relief (Guaifenesin), sinus otc decongestants and delsym with no relief of sinus congestion/cough.  No fever; no covid exposures. He went last Thursday to [place] clinic. He tested negative for  covid, negative for rsv and the flu- negative.   Pt given apt for labs and smc. He will obtain a cxr prior to coming to clinic.

## 2022-11-29 ENCOUNTER — Ambulatory Visit
Admission: RE | Admit: 2022-11-29 | Discharge: 2022-11-29 | Disposition: A | Discharge status: Home / Self Care | Payer: Medicare Other | Source: Ambulatory Visit | Attending: Medical Oncology | Admitting: Medical Oncology

## 2022-11-29 DIAGNOSIS — C669 Malignant neoplasm of unspecified ureter: Secondary | ICD-10-CM

## 2022-11-29 DIAGNOSIS — R300 Dysuria: Secondary | ICD-10-CM | POA: Insufficient documentation

## 2022-11-29 DIAGNOSIS — R310 Gross hematuria: Secondary | ICD-10-CM | POA: Insufficient documentation

## 2022-11-29 LAB — URINE CULTURE: Culture: NO GROWTH

## 2022-12-01 ENCOUNTER — Telehealth: Payer: Self-pay | Admitting: *Deleted

## 2022-12-01 ENCOUNTER — Inpatient Hospital Stay: Payer: Medicare Other | Admitting: Medical Oncology

## 2022-12-01 NOTE — Telephone Encounter (Signed)
Pt called in and states he saw Nelwyn Salisbury [facility] earlier this week on 11/28/22 for UTI.  Pt reports he is taking cephalexin and prednisone without much improvement.  Reports continues to have strong burning sensation and difficulty when urinating. Pt requests a call from [facility].

## 2022-12-01 NOTE — Telephone Encounter (Signed)
Nurse was returning pt phone call when I was notified that patient had come into clinic asking for assistance.  RN spoke in person with pt and instructed him that per  Nelwyn Salisbury PA-C if he was continuing to have difficulty urinating and discomfort that he should go to the ED at [facility] either [facility] or [facility] location.  Pt verbalized that he did not want to do go to the ED.  Nurse explained that he could have a blockage in his stent that needed to be evaluated that could lead to further complications.   nurse  re instructed that if he continued to have pain or decreased or no urine output that it was very important that he go to the ED.   Written Directions to [facility] emergency department given to pt since [facility] is where his urologist is at.  Pt stated he had appt with them on December 11, 2022.

## 2022-12-05 ENCOUNTER — Ambulatory Visit
Admission: RE | Admit: 2022-12-05 | Discharge: 2022-12-05 | Disposition: A | Discharge status: Home / Self Care | Payer: Medicare Other | Source: Ambulatory Visit | Attending: Oncology | Admitting: Oncology

## 2022-12-05 DIAGNOSIS — C61 Malignant neoplasm of prostate: Secondary | ICD-10-CM | POA: Diagnosis not present

## 2022-12-05 DIAGNOSIS — Z8551 Personal history of malignant neoplasm of bladder: Secondary | ICD-10-CM | POA: Insufficient documentation

## 2022-12-05 DIAGNOSIS — C787 Secondary malignant neoplasm of liver and intrahepatic bile duct: Secondary | ICD-10-CM | POA: Insufficient documentation

## 2022-12-05 DIAGNOSIS — C782 Secondary malignant neoplasm of pleura: Secondary | ICD-10-CM | POA: Insufficient documentation

## 2022-12-05 MED ORDER — PIFLIFOLASTAT F 18 (PYLARIFY) INJECTION
9.0000 | Freq: Once | INTRAVENOUS | Status: AC
  Administered 2022-12-05: 9.69 via INTRAVENOUS

## 2022-12-06 ENCOUNTER — Other Ambulatory Visit: Payer: Self-pay

## 2022-12-07 ENCOUNTER — Telehealth: Payer: Self-pay

## 2022-12-07 ENCOUNTER — Encounter: Payer: Self-pay | Admitting: Oncology

## 2022-12-07 ENCOUNTER — Inpatient Hospital Stay: Payer: Medicare Other

## 2022-12-07 ENCOUNTER — Inpatient Hospital Stay: Payer: Medicare Other | Attending: Oncology

## 2022-12-07 ENCOUNTER — Inpatient Hospital Stay (HOSPITAL_BASED_OUTPATIENT_CLINIC_OR_DEPARTMENT_OTHER): Payer: Medicare Other | Admitting: Oncology

## 2022-12-07 VITALS — BP 124/60 | HR 51 | Temp 95.9°F | Wt 142.1 lb

## 2022-12-07 DIAGNOSIS — Z8719 Personal history of other diseases of the digestive system: Secondary | ICD-10-CM | POA: Diagnosis not present

## 2022-12-07 DIAGNOSIS — I129 Hypertensive chronic kidney disease with stage 1 through stage 4 chronic kidney disease, or unspecified chronic kidney disease: Secondary | ICD-10-CM | POA: Insufficient documentation

## 2022-12-07 DIAGNOSIS — C61 Malignant neoplasm of prostate: Secondary | ICD-10-CM

## 2022-12-07 DIAGNOSIS — J439 Emphysema, unspecified: Secondary | ICD-10-CM | POA: Insufficient documentation

## 2022-12-07 DIAGNOSIS — K5900 Constipation, unspecified: Secondary | ICD-10-CM | POA: Diagnosis not present

## 2022-12-07 DIAGNOSIS — Z7189 Other specified counseling: Secondary | ICD-10-CM

## 2022-12-07 DIAGNOSIS — C787 Secondary malignant neoplasm of liver and intrahepatic bile duct: Secondary | ICD-10-CM

## 2022-12-07 DIAGNOSIS — Z5111 Encounter for antineoplastic chemotherapy: Secondary | ICD-10-CM

## 2022-12-07 DIAGNOSIS — M5137 Other intervertebral disc degeneration, lumbosacral region: Secondary | ICD-10-CM | POA: Diagnosis not present

## 2022-12-07 DIAGNOSIS — Z7982 Long term (current) use of aspirin: Secondary | ICD-10-CM | POA: Insufficient documentation

## 2022-12-07 DIAGNOSIS — D631 Anemia in chronic kidney disease: Secondary | ICD-10-CM

## 2022-12-07 DIAGNOSIS — N184 Chronic kidney disease, stage 4 (severe): Secondary | ICD-10-CM | POA: Insufficient documentation

## 2022-12-07 DIAGNOSIS — K219 Gastro-esophageal reflux disease without esophagitis: Secondary | ICD-10-CM | POA: Diagnosis not present

## 2022-12-07 DIAGNOSIS — G4700 Insomnia, unspecified: Secondary | ICD-10-CM | POA: Insufficient documentation

## 2022-12-07 DIAGNOSIS — Z5112 Encounter for antineoplastic immunotherapy: Secondary | ICD-10-CM | POA: Insufficient documentation

## 2022-12-07 DIAGNOSIS — Z803 Family history of malignant neoplasm of breast: Secondary | ICD-10-CM | POA: Insufficient documentation

## 2022-12-07 DIAGNOSIS — N476 Balanoposthitis: Secondary | ICD-10-CM | POA: Insufficient documentation

## 2022-12-07 DIAGNOSIS — I700 Atherosclerosis of aorta: Secondary | ICD-10-CM | POA: Diagnosis not present

## 2022-12-07 DIAGNOSIS — G893 Neoplasm related pain (acute) (chronic): Secondary | ICD-10-CM | POA: Insufficient documentation

## 2022-12-07 DIAGNOSIS — Z7952 Long term (current) use of systemic steroids: Secondary | ICD-10-CM | POA: Insufficient documentation

## 2022-12-07 DIAGNOSIS — Z85828 Personal history of other malignant neoplasm of skin: Secondary | ICD-10-CM | POA: Insufficient documentation

## 2022-12-07 DIAGNOSIS — C9000 Multiple myeloma not having achieved remission: Secondary | ICD-10-CM

## 2022-12-07 DIAGNOSIS — G4730 Sleep apnea, unspecified: Secondary | ICD-10-CM | POA: Insufficient documentation

## 2022-12-07 DIAGNOSIS — M48061 Spinal stenosis, lumbar region without neurogenic claudication: Secondary | ICD-10-CM | POA: Insufficient documentation

## 2022-12-07 DIAGNOSIS — M8580 Other specified disorders of bone density and structure, unspecified site: Secondary | ICD-10-CM | POA: Diagnosis not present

## 2022-12-07 DIAGNOSIS — Z87891 Personal history of nicotine dependence: Secondary | ICD-10-CM | POA: Diagnosis not present

## 2022-12-07 DIAGNOSIS — N4889 Other specified disorders of penis: Secondary | ICD-10-CM | POA: Diagnosis not present

## 2022-12-07 DIAGNOSIS — C679 Malignant neoplasm of bladder, unspecified: Secondary | ICD-10-CM | POA: Diagnosis not present

## 2022-12-07 DIAGNOSIS — C669 Malignant neoplasm of unspecified ureter: Secondary | ICD-10-CM | POA: Diagnosis not present

## 2022-12-07 DIAGNOSIS — M51A3 Intervertebral annulus fibrosus defect, lumbosacral region, unspecified size: Secondary | ICD-10-CM | POA: Diagnosis not present

## 2022-12-07 DIAGNOSIS — C782 Secondary malignant neoplasm of pleura: Secondary | ICD-10-CM | POA: Insufficient documentation

## 2022-12-07 DIAGNOSIS — Z923 Personal history of irradiation: Secondary | ICD-10-CM | POA: Insufficient documentation

## 2022-12-07 DIAGNOSIS — Z79899 Other long term (current) drug therapy: Secondary | ICD-10-CM | POA: Insufficient documentation

## 2022-12-07 DIAGNOSIS — Z79624 Long term (current) use of inhibitors of nucleotide synthesis: Secondary | ICD-10-CM | POA: Insufficient documentation

## 2022-12-07 LAB — CBC WITH DIFFERENTIAL/PLATELET
Abs Immature Granulocytes: 0.02 10*3/uL (ref 0.00–0.07)
Basophils Absolute: 0 10*3/uL (ref 0.0–0.1)
Basophils Relative: 0 %
Eosinophils Absolute: 0.3 10*3/uL (ref 0.0–0.5)
Eosinophils Relative: 5 %
HCT: 35.5 % — ABNORMAL LOW (ref 39.0–52.0)
Hemoglobin: 11.9 g/dL — ABNORMAL LOW (ref 13.0–17.0)
Immature Granulocytes: 0 %
Lymphocytes Relative: 9 %
Lymphs Abs: 0.6 10*3/uL — ABNORMAL LOW (ref 0.7–4.0)
MCH: 31.4 pg (ref 26.0–34.0)
MCHC: 33.5 g/dL (ref 30.0–36.0)
MCV: 93.7 fL (ref 80.0–100.0)
Monocytes Absolute: 0.7 10*3/uL (ref 0.1–1.0)
Monocytes Relative: 10 %
Neutro Abs: 5.4 10*3/uL (ref 1.7–7.7)
Neutrophils Relative %: 76 %
Platelets: 241 10*3/uL (ref 150–400)
RBC: 3.79 MIL/uL — ABNORMAL LOW (ref 4.22–5.81)
RDW: 14 % (ref 11.5–15.5)
WBC: 7.1 10*3/uL (ref 4.0–10.5)
nRBC: 0 % (ref 0.0–0.2)

## 2022-12-07 LAB — COMPREHENSIVE METABOLIC PANEL
ALT: 23 U/L (ref 0–44)
AST: 22 U/L (ref 15–41)
Albumin: 3.5 g/dL (ref 3.5–5.0)
Alkaline Phosphatase: 79 U/L (ref 38–126)
Anion gap: 8 (ref 5–15)
BUN: 42 mg/dL — ABNORMAL HIGH (ref 8–23)
CO2: 27 mmol/L (ref 22–32)
Calcium: 8.6 mg/dL — ABNORMAL LOW (ref 8.9–10.3)
Chloride: 104 mmol/L (ref 98–111)
Creatinine, Ser: 2.11 mg/dL — ABNORMAL HIGH (ref 0.61–1.24)
GFR, Estimated: 30 mL/min — ABNORMAL LOW (ref >60)
Glucose, Bld: 80 mg/dL (ref 70–99)
Potassium: 4.3 mmol/L (ref 3.5–5.1)
Sodium: 139 mmol/L (ref 135–145)
Total Bilirubin: 0.6 mg/dL (ref 0.3–1.2)
Total Protein: 6 g/dL — ABNORMAL LOW (ref 6.5–8.1)

## 2022-12-07 MED ORDER — DEXAMETHASONE 4 MG PO TABS: ORAL_TABLET | ORAL | 1 refills | Status: DC

## 2022-12-07 MED ORDER — ONDANSETRON HCL 8 MG PO TABS: 8.0000 mg | ORAL_TABLET | Freq: Three times a day (TID) | ORAL | 1 refills | Status: DC | PRN

## 2022-12-07 MED ORDER — PROCHLORPERAZINE MALEATE 10 MG PO TABS: 10.0000 mg | ORAL_TABLET | Freq: Four times a day (QID) | ORAL | 1 refills | Status: DC | PRN

## 2022-12-07 NOTE — Telephone Encounter (Signed)
Called and informed pt to pick up Dexamethasone and antiemetics from [facility]. Pt verbzlied understanding. Administration directions reviewed with pt. Appt details also reviewed with pt.

## 2022-12-07 NOTE — Progress Notes (Signed)
START ON PATHWAY REGIMEN - Prostate     A cycle is every 21 days:     Prednisone      Docetaxel   **Always confirm dose/schedule in your pharmacy ordering system**  Patient Characteristics: Adenocarcinoma, Recurrent/New Systemic Disease (Including Biochemical Recurrence), Castration Resistant, M1, No Prior Novel Hormonal Agent, BRCA Absent/Unknown Histology: Adenocarcinoma Therapeutic Status: Recurrent/New Systemic Disease (Including Biochemical Recurrence) BRCA Mutation Status: Awaiting Test Results Intent of Therapy: Non-Curative / Palliative Intent, Discussed with Patient

## 2022-12-07 NOTE — Assessment & Plan Note (Addendum)
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  I will hold off Daratumumab today due to his recent URI.  Since the discontinuation of Revlimid and velcade, light chain ratio has increased. Expect that his myeloma will progress in the future. Currently, will prioritize his treatment for prostate cancer  Continue Acyclovir 223m BID  -Bone health, currently he does not have an active myeloma bone involvement.  Patient reports history of developing adverse reaction [vision loss] after Prolia treatments. -s/p  root canal.  Awaiting  ophthalmologist to clearance of startling Xgeva. .Marland Kitchen

## 2022-12-07 NOTE — Progress Notes (Signed)
Hematology/Oncology Progress note [phone] [phone]      Patient Care Team: Baxter Hire, MD as PCP - General (Internal Medicine) Noreene Filbert, MD as Radiation Oncologist (Radiation Oncology) Baxter Hire, MD (Internal Medicine) Lavonia Dana, MD as Consulting Physician (Nephrology) [name], MD as Consulting Physician (Oncology)  ASSESSMENT & PLAN:   Cancer Staging  Multiple myeloma 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  Cystoscopy at [facility] showed ureter high-grade carcinoma as well as bladder high-grade papillary carcinoma. S/p palliative radiation. Repeat cystoscopy did not reveal any recurrence. - He has ureter stent, Recommend patient to see [facility]  Possible need of starting [place] and Padcev in the near future if recurrence develops.   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 [facility]  Rational and side effects were reviewed with patient and his wife. He agrees with the plan.  Recommend claritin 1067maily x 4 days after GCSF Plan to start next week.     Multiple myeloma (HCC) #Stage III IgA kappa multiple myeloma, myeloma FISH panel positive for 13q deletion, gain of 1q, t (4;14), high risk, not candidate for autologous bone marrow transplant. 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  I will hold off Daratumumab today due to his recent URI.  Since the discontinuation of Revlimid and velcade, light chain ratio has increased. Expect that his myeloma will progress in the future. Currently, will prioritize his treatment for prostate cancer  Continue Acyclovir 200m55mD  -Bone health, currently he does not have an active myeloma bone involvement.  Patient reports history of developing adverse reaction [vision loss] after Prolia treatments. -s/p  root canal.  Awaiting  ophthalmologist to clearance of startling Xgeva. .   Stage 4 chronic kidney disease (HCC) Avoid nephrotoxins.  Encourage oral hydration.   Encounter for antineoplastic chemotherapy Chemotherapy plan as listed above  Anemia in stage 4 chronic kidney disease (HCC) Stable hemoglobin. monitor   Goals of care, counseling/discussion Overall his diagnosis is poor, due to three aggressive malignant conditions with competing need for different cancer treatments.  I recommend patient to  follow up with  palliative care service    Orders Placed This Encounter  Procedures   Consent Attestation for Oncology Treatment    Order Specific Question:   The patient is informed of risks, benefits, side-effects of the prescribed oncology treatment. Potential short term and long term side effects and response rates discussed. After a long discussion, the patient made informed decision to proceed.    Answer:   Yes   CBC with Differential    Standing Status:   Future    Standing Expiration Date:   12/14/2023   Comprehensive metabolic panel    Standing Status:   Future    Standing Expiration Date:   12/14/2023   CBC with Differential    Standing Status:   Future    Standing Expiration Date:   01/04/2024   Comprehensive metabolic panel    Standing Status:   Future    Standing Expiration Date:   01/04/2024   PSA    Standing Status:   Future    Standing Expiration Date:   12/14/2023    Follow-up per LOS  All questions were answered. The patient knows to call the clinic with any problems, questions or concerns.  [name], MD, PhD [facility] Hematology Oncology 12/07/2022    CHIEF COMPLAINTS/REASON FOR VISIT:  Multiple myeloma, high-grade urothelial carcinoma.metastatic prostate cancer.   HISTORY OF PRESENTING ILLNESS:  [name] [name] is a 82 y.o. male who was seen in consultation at the request of [name], MD presents for follow-up of multiple myeloma and high-grade urothelial carcinoma, and metastatic castration resistant prostate cancer.   Oncology History  Multiple myeloma ([place])  03/02/2022 Imaging   PET showed No definitive signs of multiple myeloma by FDG PET.   Obstruction of the RIGHT ureter with soft tissue mass in the RIGHT pelvis showing increased metabolic activity suspicious first and foremost for urothelial neoplasm given location adjacent to RIGHT UVJ and affect upon the RIGHT ureter.   Little or no excretion of FDG on the RIGHT but still with uptake of FDG by renal  parenchyma. Degree of hydronephrosis is not substantially changed but lack of FDG excretion on the RIGHT is compatible with secondary sign of physiologic significance of ureteral obstruction.   RIGHT pelvic sidewall uptake without visible lesion, could represent tiny lymph node not visible on noncontrast imaging.   Aortic atherosclerosis.  Pulmonary emphysema. Aortic Atherosclerosis and Emphysema    03/17/2022 Initial Diagnosis   Multiple myeloma ([place]) -Patient has progressively worsening kidney function.  He is scheduled for kidney biopsy. 01/26/2022, free kappa light chain 1058, lambda 15.3, light chain ratio 69. Protein electrophoresis showed restricted band-M spike migrating in the beta-1 globulin region. ANA negative.  ANCA negative. Random urine protein electrophoresis showed abnormal protein band detected in the gamma globulin And a second possible abnormal protein band that may represent monoclonal protein.   02/23/2022 multiple myeloma showed IgA 2883, M protein of 1.8, beta 2 microglobulin 6.9, kappa free light chain 1268.9, lambda 13.2, free light chain ratio 96.13.   CMP showed creatinine of 3.41, that is significantly worse than his baseline in June 2022. 02/28/2022, 24-hour urine protein electrophoresis showed M protein of 729 mg, IgA monoclonal kappa light chain. 03/01/2022, [facility] kidney biopsy showed diffuse light chain tubulopathy, and light chain cast nephropathy, moderate interstitial fibrosis,4% global glomerular sclerosis and the marked atherosclerosis.  03/06/2022, patient underwent a bone marrow biopsy. Pathology showed hypercellular bone marrow with plasma cell neoplasm, representing 20% of all cells in the aspirate associated with prominent  interstitial infiltrates and numerous predominantly small clusters in the clots in the biopsy sections.  The plasma cells display kappa light chain restriction consistent with plasma cell neoplasm. Normal cytogenetics, myeloma FISH panel  positive for 13q deletion, gain of 1q, t (4;14)   03/17/2022 Cancer Staging   Staging form: Plasma Cell Myeloma and Plasma Cell Disorders, AJCC 8th Edition - Clinical stage from 03/17/2022: RISS Stage III (Beta-2-microglobulin (mg/L): 6.9, Albumin (g/dL): 3.3, ISS: Stage III, High-risk cytogenetics: Present, LDH: Normal) - Signed by [name], MD on 03/17/2022 Stage prefix: Initial diagnosis Beta 2 microglobulin range (mg/L): Greater than or equal to 5.5 Albumin range (g/dL): Less than 3.5 Cytogenetics: t(4;14) translocation, Other mutation, 1q addition   03/24/2022 - 07/19/2022 Chemotherapy   Patient is on Treatment Plan : MYELOMA NEWLY DIAGNOSED Daratumumab + Dexamethasone Weekly (DaraRd) q28d     09/21/2022 Imaging   MRI lumbar spine wo contrast 1. 6 mm nodule in the cauda equina at the L4-5 level. 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 Recommend MRI of the lumbar spine with contrast for further evaluation. 2. 12 mm T2 hyperintense lesion along the inferior aspect of the S1 vertebral body. This may represent focal involvement of multiple myeloma. No other discrete lesions are present. No pathologic fracture is present. 3. Mild bilateral foraminal stenosis at L3-4 and L4-5. 4. Left paramedian annular tear at L5-S1 near the left S1 nerve roots.   12/13/2022 -  Chemotherapy   Patient is on Treatment Plan : PROSTATE Docetaxel (75) q21d     01/10/2023 - 01/10/2023 Chemotherapy   Patient is on Treatment Plan : PROSTATE Docetaxel (75) + Prednisone q21d     Urothelial carcinoma of distal ureter ([place])  03/02/2022 Imaging   PET showed No definitive signs of multiple myeloma by FDG PET.   Obstruction of the RIGHT ureter with soft tissue mass in the RIGHT pelvis showing increased metabolic activity suspicious first and foremost for urothelial neoplasm given location adjacent to RIGHT UVJ and affect upon the RIGHT ureter.   Little or no excretion of FDG on the  RIGHT but still with uptake of FDG by renal parenchyma. Degree of hydronephrosis is not substantially changed but lack of FDG excretion on the RIGHT is compatible with secondary sign of physiologic significance of ureteral obstruction.   RIGHT pelvic sidewall uptake without visible lesion, could represent tiny lymph node not visible on noncontrast imaging.   Aortic atherosclerosis.  Pulmonary emphysema. Aortic Atherosclerosis and Emphysema    03/17/2022 Initial Diagnosis   Urothelial carcinoma of distal ureter ([place])  -03/14/2022, cystoscopy showed diffuse narrowing right distal ureter with hydroureter following up the distal ureter and extending proximally.  Right ureteroscopy showed nodular tumor distal ureter and a convincing area without tumor suspicious for extrinsic obstruction.s/p   right ureteral stent placement. Ureteral tumor showed a tiny fragments of fibrous tissue, interpretation limited by thermal and crush artifact.  Right distal ureter saline barbotage showed hight grade urothelial carcinoma.    03/17/2022 Cancer Staging   Staging form: Renal Pelvis and Ureter, AJCC 8th Edition - Clinical: Stage Unknown (cTX, cN0, cM0) - Signed by [name], MD on 03/17/2022 Stage prefix: Initial diagnosis WHO/ISUP grade (low/high): High Grade Histologic grading system: 2 grade system   06/05/2022 Imaging   PET scan at [facility] showed 1.Interval placement of right-sided double-J ureteral stent with resolution of hydronephrosis and improved excretion of radiotracer.  2.There is persistent abnormal hypermetabolic tissue extending posterior lateral from the right  aspect of the bladder encasing the distal ureter/stent which measures approximately 5.1 x 4.2 (CT image 243).  3.There are similar linear areas of abnormal hypermetabolic tissue seen to extend more superiorly along the right posterior pelvic wall/peritoneum, and towards the sciatic notch (fused image 238). Linear configuration suggests possible  perineural spread. Note that this soft tissue abuts and may encase pelvic vasculature including the external and internal iliac arteries.  4.NEW single subcentimeter but abnormal focus of FDG avidity in the right presacral region (CT image 231) measuring 1.6 cm representing new site of neoplastic disease   06/19/2022 - 08/10/2022 Radiation Therapy   Pelvis RT 06/19/22-08/21/22 Bladder RT 07/27/22- 08/20/22    09/18/2022 Imaging   PET scan at [facility] showed 1. Evidence of disease progression with multifocal new hypermetabolic lesions around the periphery of the liver as described above. The evaluation of these lesions is limited by significant misregistration between the PET and CT portions of the examination. Contrast-enhanced CT or MRI could be used for confirmation and/or guidance of biopsy as necessary. There is also a lung parenchymal nodule with new associated FDG uptake, also suspicious for a site of metastasis.   2. Although the right nephroureteral stent appears to be well positioned, the right kidney demonstrates slightly increased hydronephrosis with significant surrounding fat stranding and relatively delayed clearance of FDG. These findings could suggest stent malfunction or be related to upper urinary tract infection. Suggest clinical correlation with any patient signs or symptoms such as right flank pain, fever, and/or urinalysis findings.     09/21/2022 Imaging   MRI lumbar spine wo contrast 1. 6 mm nodule in the cauda equina at the L4-5 level. 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 Recommend MRI of the lumbar spine with contrast for further evaluation. 2. 12 mm T2 hyperintense lesion along the inferior aspect of the S1 vertebral body. This may represent focal involvement of multiple myeloma. No other discrete lesions are present. No pathologic fracture is present. 3. Mild bilateral foraminal stenosis at L3-4 and L4-5. 4. Left paramedian annular tear  at L5-S1 near the left S1 nerve roots.    Genetic Testing   Negative genetic testing. No pathogenic variants identified on the [facility] Multi-Cancer+RNA panel. The report date is 11/19/2022.  The Multi-Cancer + RNA Panel offered by [facility] includes sequencing and/or deletion/duplication analysis of the following 70 genes:  AIP*, ALK, APC*, ATM*, AXIN2*, BAP1*, BARD1*, BLM*, BMPR1A*, BRCA1*, BRCA2*, BRIP1*, CDC73*, CDH1*, CDK4, CDKN1B*, CDKN2A, CHEK2*, CTNNA1*, DICER1*, EPCAM, EGFR, FH*, FLCN*, GREM1, HOXB13, KIT, LZTR1, MAX*, MBD4, MEN1*, MET, MITF, MLH1*, MSH2*, MSH3*, MSH6*, MUTYH*, NF1*, NF2*, NTHL1*, PALB2*, PDGFRA, PMS2*, POLD1*, POLE*, POT1*, PRKAR1A*, PTCH1*, PTEN*, RAD51C*, RAD51D*, RB1*, RET, SDHA*, SDHAF2*, SDHB*, SDHC*, SDHD*, SMAD4*, SMARCA4*, SMARCB1*, SMARCE1*, STK11*, SUFU*, TMEM127*, TP53*, TSC1*, TSC2*, VHL*. RNA analysis is performed for * genes.   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 Evidence of local prostate carcinoma within the pelvis with peritoneal implants along the bowel serosal surface as well as a metastatic LEFT iliac lymph node. 2. Multifocal intense radiotracer avid large hepatic  prostate carcinoma metastasis. 3. Multifocal pleural metastasis in the RIGHT hemithorax. 4. No clear evidence of skeletal metastasis      Genetic Testing   Negative genetic testing. No pathogenic variants identified on the [facility] Multi-Cancer+RNA panel. The report date is 11/19/2022.  The Multi-Cancer + RNA Panel offered by [facility] includes sequencing and/or deletion/duplication analysis  of the following 70 genes:  AIP*, ALK, APC*, ATM*, AXIN2*, BAP1*, BARD1*, BLM*, BMPR1A*, BRCA1*, BRCA2*, BRIP1*, CDC73*, CDH1*, CDK4, CDKN1B*, CDKN2A, CHEK2*, CTNNA1*, DICER1*, EPCAM, EGFR, FH*, FLCN*, GREM1, HOXB13, KIT, LZTR1, MAX*, MBD4, MEN1*, MET, MITF, MLH1*, MSH2*, MSH3*, MSH6*, MUTYH*, NF1*, NF2*, NTHL1*, PALB2*, PDGFRA, PMS2*, POLD1*, POLE*, POT1*, PRKAR1A*, PTCH1*, PTEN*, RAD51C*, RAD51D*, RB1*, RET, SDHA*, SDHAF2*, SDHB*, SDHC*, SDHD*, SMAD4*, SMARCA4*, SMARCB1*, SMARCE1*, STK11*, SUFU*, TMEM127*, TP53*, TSC1*, TSC2*, VHL*. RNA analysis is performed for * genes.     INTERVAL HISTORY [name] [name] is a 82 y.o. male who has above history reviewed by me today presents for follow up visit for multiple myeloma and high-grade urothelial carcinoma, metastatic prostate cancer.  S/p PMSA PET He has developed URI and urinary urgency symptoms,  11/27/22 UA showed RBC >50, moderate leukocytes, Urine culture negative.  Cipro did not provide him any relieve. Was seen by symptomatic management clinic and was prescribed a course steroid and Keflex.  11/28/22 CXR showed bronchitis 11/29/22 US renal Mild right hydronephrosis and proximal hydroureter. Low level echoes within the visualized proximal right ureter and renal pelvis,which may reflect blood products or debris.2. Stent is partially visualized within the urinary bladder. Right ureteral jet was not definitively seen. He will see [facility] urology.  URI has improved a lot, not completely resolved yet. He denies SOB, cough, nausea vomiting, fever or chills.    Review of Systems  Constitutional:  Positive for fatigue. Negative for appetite change, chills, fever and unexpected weight change.  HENT:   Negative for hearing loss and voice change.   Eyes:  Negative for eye problems and icterus.  Respiratory:  Negative for chest tightness, cough and shortness of breath.   Cardiovascular:  Negative for chest pain and leg swelling.  Gastrointestinal:  Negative  for abdominal distention, abdominal pain and diarrhea.  Endocrine: Negative for hot flashes.  Genitourinary:  Positive for frequency. Negative for difficulty urinating and dysuria.   Musculoskeletal:  Negative for arthralgias.  Skin:  Negative for itching.       itching  Neurological:  Negative for light-headedness and numbness.  Hematological:  Negative for adenopathy. Does not bruise/bleed easily.  Psychiatric/Behavioral:  Negative for confusion.      MEDICAL HISTORY:  Past Medical History:  Diagnosis Date   Age related osteoporosis    Anemia    Barrett's esophagus    Bradycardia    Cancer (HCC)    PROSTATE   Cataract    CKD (chronic kidney disease) stage 3, GFR 30-59 ml/min (HCC)    Colon polyps    DDD (degenerative disc disease), cervical    DDD (degenerative disc disease), lumbar    Femur fracture (HCC)    Right   Fundic gland polyposis of stomach    Fundic gland polyps of stomach, benign    Gastritis    GERD (gastroesophageal reflux disease)    Hematuria    Hemorrhage of rectum and anus    History of colon polyps    Insomnia    Lumbago    Osteopenia of the elderly    Skin cancer    Sleep apnea     SURGICAL HISTORY:  Past Surgical History:  Procedure Laterality Date   BAND HEMORRHOIDECTOMY     COLONOSCOPY     COLONOSCOPY WITH PROPOFOL N/A 05/06/2018   Procedure: COLONOSCOPY WITH PROPOFOL;  Surgeon: Lollie Sails, MD;  Location: [facility] ENDOSCOPY;  Service: Endoscopy;  Laterality: N/A;   COLONOSCOPY WITH PROPOFOL N/A 09/23/2018   Procedure: COLONOSCOPY WITH PROPOFOL;  Surgeon: Lollie Sails, MD;  Location: [facility] ENDOSCOPY;  Service: Endoscopy;  Laterality: N/A;   COLONOSCOPY WITH PROPOFOL N/A 03/30/2021   Procedure: COLONOSCOPY WITH PROPOFOL;  Surgeon: Toledo, Benay Pike, MD;  Location: [facility] ENDOSCOPY;  Service: Gastroenterology;  Laterality: N/A;   CYSTOSCOPY W/ URETERAL STENT PLACEMENT Right 03/14/2022   Procedure: CYSTOSCOPY WITH RETROGRADE  PYELOGRAM/URETERAL STENT PLACEMENT;  Surgeon: Abbie Sons, MD;  Location: [facility] ORS;  Service: Urology;  Laterality: Right;   ESOPHAGOGASTRODUODENOSCOPY (EGD) WITH PROPOFOL N/A 10/03/2016   Procedure: ESOPHAGOGASTRODUODENOSCOPY (EGD) WITH PROPOFOL;  Surgeon: Lollie Sails, MD;  Location: [facility] ENDOSCOPY;  Service: Endoscopy;  Laterality: N/A;   EYE SURGERY     CATARACTS   EYELID SURGERY     FLEXIBLE SIGMOIDOSCOPY     FRACTURE SURGERY Right 2016   [place]   FRACTURE SURGERY     ORIF DISTAL FEMUR FRACTURE     TONSILLECTOMY     TRANSURETHRAL RESECTION OF BLADDER TUMOR N/A 03/14/2022   Procedure: TRANSURETHRAL RESECTION OF BLADDER TUMOR (TURBT);  Surgeon: Abbie Sons, MD;  Location: [facility] ORS;  Service: Urology;  Laterality: N/A;    SOCIAL HISTORY: Social History   Socioeconomic History   Marital status: Married    Spouse name: Not on file   Number of children: Not on file   Years of education: Not on file   Highest education level: Not on file  Occupational History   Not on file  Tobacco Use   Smoking status: Former    Packs/day: 1.00    Years: 15.00    Total pack years: 15.00    Types: Cigarettes    Quit date: 10/03/1974    Years since quitting: 48.2   Smokeless tobacco: Never  Vaping Use   Vaping Use: Never used  Substance and Sexual Activity   Alcohol use: Not Currently    Alcohol/week: 1.0 standard drink of alcohol    Types: 1 Cans of beer per week    Comment: 1 beer every 2 weeks   Drug use: No   Sexual activity: Yes    Birth control/protection: None  Other Topics Concern   Not on file  Social History Narrative   ** Merged History Encounter **       Social Determinants of Health   Financial Resource Strain: Not on file  Food Insecurity: Not on file  Transportation Needs: Not on file  Physical Activity: Not on file  Stress: Not on file  Social Connections: Not on file  Intimate Partner Violence: Not on file    FAMILY HISTORY: Family History   Problem Relation Age of Onset   Breast cancer Mother        dx 18s-50s   Prostate cancer Paternal Grandfather 24   Breast cancer Cousin     ALLERGIES:  is allergic to demerol [meperidine hcl], gabapentin, prolia [denosumab], demerol [meperidine], and nsaids.  MEDICATIONS:  Current Outpatient Medications  Medication Sig Dispense Refill   acyclovir (ZOVIRAX) 200 MG capsule Take 1 capsule (200 mg total) by mouth 2 (two) times daily. 60 capsule 3   Calcium Carbonate (CALCIUM 500 PO) Take 1 tablet by mouth  daily.     Cholecalciferol (VITAMIN D3) 125 MCG (5000 UT) CAPS Take 5,000 Units by mouth daily.     ferrous sulfate 325 (65 FE) MG tablet Take 325 mg by mouth daily.     ipratropium (ATROVENT) 0.03 % nasal spray Place 1 spray into both nostrils in the morning.     losartan (COZAAR) 25 MG tablet Take 25 mg by mouth daily.     melatonin 5 MG TABS Take 5 mg by mouth at bedtime.     montelukast (SINGULAIR) 10 MG tablet Take 1 tablet (10 mg total) by mouth See admin instructions. Take 1 tablet the day prior to Daratumumab treatments, and take 1 tablet daily for 2 days after treatments 30 tablet 1   Multiple Vitamins-Minerals (MENS 50+ MULTIVITAMIN) TABS Take 1 tablet by mouth daily.     senna (SENOKOT) 8.6 MG TABS tablet Take 2 tablets (17.2 mg total) by mouth daily. 120 tablet 0   traZODone (DESYREL) 50 MG tablet Take 25 mg by mouth at bedtime.     triamcinolone cream (KENALOG) 0.1 % Apply 1 Application topically 2 (two) times daily.     trospium (SANCTURA) 20 MG tablet Take by mouth. Take 1 tablet (20 mg total) by mouth daily as needed (as needed for urinary urgency).     aspirin EC 81 MG tablet Take 1 tablet (81 mg total) by mouth daily. Swallow whole. (Patient not taking: Reported on 12/07/2022) 30 tablet 11   dexamethasone (DECADRON) 4 MG tablet Take 2 tabs by mouth 2 times daily starting day before chemo. Then take 2 tabs daily for 2 days starting day after chemo. Take with food. 30 tablet 1    hydrOXYzine (ATARAX) 10 MG tablet Take 1 tablet (10 mg total) by mouth every 8 (eight) hours as needed for itching or anxiety. (Patient not taking: Reported on 11/28/2022) 30 tablet 1   ondansetron (ZOFRAN) 8 MG tablet Take 1 tablet (8 mg total) by mouth every 8 (eight) hours as needed for nausea or vomiting. 30 tablet 1   predniSONE (STERAPRED UNI-PAK 21 TAB) 10 MG (21) TBPK tablet Use as directed. (Patient not taking: Reported on 12/07/2022) 1 each 0   prochlorperazine (COMPAZINE) 10 MG tablet Take 1 tablet (10 mg total) by mouth every 6 (six) hours as needed for nausea or vomiting. 30 tablet 1   No current facility-administered medications for this visit.     PHYSICAL EXAMINATION: ECOG PERFORMANCE STATUS: 1 - Symptomatic but completely ambulatory Today's Vitals   12/07/22 0913  BP: 124/60  Pulse: (!) 51  Temp: (!) 95.9 F (35.5 C)  TempSrc: Tympanic  SpO2: 99%  Weight: 142 lb 1.6 oz (64.5 kg)  PainSc: 6    Body mass index is 22.26 kg/m.   Physical Exam Constitutional:      General: He is not in acute distress. HENT:     Head: Normocephalic and atraumatic.  Eyes:     General: No scleral icterus. Cardiovascular:     Rate and Rhythm: Normal rate and regular rhythm.  Pulmonary:     Effort: Pulmonary effort is normal. No respiratory distress.     Breath sounds: No wheezing.  Abdominal:     General: There is no distension.     Palpations: Abdomen is soft.  Musculoskeletal:        General: No deformity. Normal range of motion.     Cervical back: Normal range of motion and neck supple.  Skin:    General: Skin is  warm and dry.     Findings: Rash present. No erythema.  Neurological:     Mental Status: He is alert and oriented to person, place, and time. Mental status is at baseline.     Cranial Nerves: No cranial nerve deficit.     Coordination: Coordination normal.  Psychiatric:        Mood and Affect: Mood normal.      LABORATORY DATA:  I have reviewed the data  as listed    Latest Ref Rng & Units 12/07/2022    8:51 AM 11/28/2022   12:22 PM 11/09/2022    9:05 AM  CBC  WBC 4.0 - 10.5 K/uL 7.1  5.4  4.9   Hemoglobin 13.0 - 17.0 g/dL 11.9  11.5  11.1   Hematocrit 39.0 - 52.0 % 35.5  34.6  34.5   Platelets 150 - 400 K/uL 241  198  172        Latest Ref Rng & Units 12/07/2022    8:51 AM 11/28/2022   12:22 PM 11/09/2022    9:05 AM  CMP  Glucose 70 - 99 mg/dL 80  124  86   BUN 8 - 23 mg/dL 42  40  43   Creatinine 0.61 - 1.24 mg/dL 2.11  2.41  2.16   Sodium 135 - 145 mmol/L 139  137  139   Potassium 3.5 - 5.1 mmol/L 4.3  4.2  4.6   Chloride 98 - 111 mmol/L 104  105  106   CO2 22 - 32 mmol/L _0 Calcium 8.9 - 10.3 mg/dL 8.6  8.6  8.5   Total Protein 6.5 - 8.1 g/dL 6.0  6.1  5.9   Total Bilirubin 0.3 - 1.2 mg/dL 0.6  0.5  0.5   Alkaline Phos 38 - 126 U/L 79  88  71   AST 15 - 41 U/L _1 ALT 0 - 44 U/L _2 Iron/TIBC/Ferritin/ %Sat    Component Value Date/Time   IRON 60 02/23/2022 1523   TIBC 238 (L) 02/23/2022 1523   FERRITIN 86 02/23/2022 1523   IRONPCTSAT 25 02/23/2022 1523       RADIOGRAPHIC STUDIES: I have personally reviewed the radiological images as listed and agreed with the findings in the report.  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 Additional history of bladder cancer. EXAM: NUCLEAR MEDICINE PET SKULL BASE TO THIGH TECHNIQUE: 9.7 mCi F18 Piflufolastat (Pylarify) was injected intravenously. Full-ring PET imaging was performed from the skull base to thigh after the radiotracer. CT data was obtained and used for attenuation correction and anatomic localization. COMPARISON:  FDG PET scan 05/02/2022 FINDINGS: NECK No radiotracer activity in neck lymph nodes. Incidental CT finding: None. CHEST Intense radiotracer activity at the junction of the first rib in the manubrium along the pleural  surface with SUV max equal 15.1 (image 81). Intense activity within the RIGHT lower lobe along the pleural surface with SUV max equal 27.8 (image 132). There is mild pleural thickening at this level. Additional lesion along the pleural surface of the RIGHT lower lobe on image 110 with SUV max equal 27. Small lymph node position between the esophagus and descending thoracic aorta adjacent to the spine with SUV max equal 11.5 on image 116. Potential small lymph node evident on the  CT portion exam measuring 2-3 mm. Incidental CT finding: No discrete pulmonary nodules. ABDOMEN/PELVIS Prostate: No focal activity in prostatectomy bed. Intense metabolic activity within the deep pelvis adjacent to the rectum with SUV max equal 30.4 (image 223). No clear CT correlation on noncontrast exam. Potential serosal metastatic implant. Additional potential small bowel serosal implant on image 220 in the deep LEFT pelvis. Lymph nodes: Radiotracer avid LEFT external iliac lymph node measuring 12 mm image 211 SUV max equal 25.8. Liver: Multifocal round lesion bilobed hepatic lesions with intense radiotracer activity. Example lesion in the superior LEFT hepatic lobe measuring 35 mm with SUV max equal 29.6 (image 130).\ Example lesion in the RIGHT hepatic lobe measuring 38 mm SUV max equal 36.4. Approximately 15 lesions in the liver. Incidental CT finding: Double-J ureteral stent on the RIGHT without obstruction. SKELETON No clear evidence skeletal metastasis. Lesions in the RIGHT pleural space adjacent ribs are favored pleural lesions rather than skeletal lesions. IMPRESSION: 1. Evidence of local prostate carcinoma within the pelvis with peritoneal implants along the bowel serosal surface as well as a metastatic LEFT iliac lymph node. 2. 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 3. Multifocal pleural metastasis in the RIGHT hemithorax. 4. No clear evidence of skeletal metastasis Electronically  Signed   By: Suzy Bouchard M.D.   On: 12/05/2022 16:39   US RENAL  Result Date: 11/29/2022 CLINICAL DATA:  Dysuria, hematuria, urothelial carcinoma EXAM: RENAL / URINARY TRACT ULTRASOUND COMPLETE COMPARISON:  Ultrasound 02/02/2022, PET-CT 03/02/2022 FINDINGS: Right Kidney: Renal measurements: 9.5 x 5.0 x 6.9 cm = volume: 173 mL. Echogenicity within normal limits. No mass or shadowing stone visualized. Mild hydronephrosis. The proximal right ureter is dilated. Low level echoes within the visualized proximal ureter and right renal pelvis. Left Kidney: Renal measurements: 10.2 x 7.1 x 5.5 cm = volume: 204 mL. Echogenicity within normal limits. Benign cysts within the lower pole of the left kidney which do not require follow-up imaging. No shadowing stone or hydronephrosis visualized. Bladder: Appears normal for degree of bladder distention. Stent is partially visualized within the urinary bladder. Right ureteral jet was not definitively seen. Other: None. IMPRESSION: 1. Mild right hydronephrosis and proximal hydroureter. Low level echoes within the visualized proximal right ureter and renal pelvis, which may reflect blood products or debris. 2. Stent is partially visualized within the urinary bladder. Right ureteral jet was not definitively seen. Electronically Signed   By: Davina Poke D.O.   On: 11/29/2022 14:54   DG Abd 1 View  Result Date: 11/29/2022 CLINICAL DATA:  Flank pain, dysuria EXAM: ABDOMEN - 1 VIEW COMPARISON:  PET-CT done on 03/02/2022 FINDINGS: Bowel gas pattern is nonspecific. Right ureteral stent is seen in place. Phleboliths are seen in pelvis. There is previous internal fixation in the right femur. IMPRESSION: Right ureteral stent is noted in place. Nonspecific bowel gas pattern. Electronically Signed   By: Elmer Picker M.D.   On: 11/29/2022 14:49   DG Chest 2 View  Result Date: 11/28/2022 CLINICAL DATA:  Cough.  Metastatic prostate cancer. EXAM: CHEST - 2 VIEW  COMPARISON:  06/21/2022 FINDINGS: Heart size is normal. Mild aortic tortuosity. Bronchial thickening suggesting bronchitis. No consolidation, collapse or effusion. No acute bone finding. IMPRESSION: Bronchitis pattern. No consolidation or collapse. Electronically Signed   By: Nelson Chimes M.D.   On: 11/28/2022 12:19   US BIOPSY (LIVER)  Result Date: 10/06/2022 INDICATION: History of multiple myeloma, new liver lesions by PET-CT EXAM: ULTRASOUND CORE BIOPSY RIGHT INFERIOR  LIVER LESION MEDICATIONS: 1% LIDOCAINE LOCAL ANESTHESIA/SEDATION: Moderate (conscious) sedation was employed during this procedure. A total of Versed 1.0 mg and Fentanyl 50 mcg was administered intravenously by the radiology nurse. Total intra-service moderate Sedation Time: 10 minutes. The patient's level of consciousness and vital signs were monitored continuously by radiology nursing throughout the procedure under my direct supervision. COMPLICATIONS: None immediate. PROCEDURE: Informed written consent was obtained from the patient after a thorough discussion of the procedural risks, benefits and alternatives. All questions were addressed. Maximal Sterile Barrier Technique was utilized including caps, mask, sterile gowns, sterile gloves, sterile drape, hand hygiene and skin antiseptic. A timeout was performed prior to the initiation of the procedure. previous imaging reviewed. preliminary ultrasound performed. a right inferior hepatic lesion was localized and marked for biopsy. under sterile conditions and local anesthesia, the 17 gauge 6.8 cm access was advanced from a lower intercostal approach to the lesion. needle position confirmed with ultrasound. images obtained for documentation. 2 18 gauge cores obtained. samples placed in formalin. needle tract occluded with gel-foam. postprocedure imaging demonstrates no hemorrhage or hematoma. patient tolerated biopsy well. IMPRESSION: Successful CT-guided core biopsy of a right inferior hepatic  lesion. Electronically Signed   By: Jerilynn Mages.  Shick M.D.   On: 10/06/2022 10:42   US Venous Img Upper Uni Right  Result Date: 09/22/2022 CLINICAL DATA:  82 year old male with a history of facial swelling EXAM: RIGHT UPPER EXTREMITY VENOUS DOPPLER ULTRASOUND TECHNIQUE: Gray-scale sonography with graded compression, as well as color Doppler and duplex ultrasound were performed to evaluate the upper extremity deep venous system from the level of the subclavian vein and including the jugular, axillary, basilic, radial, ulnar and upper cephalic vein. Spectral Doppler was utilized to evaluate flow at rest and with distal augmentation maneuvers. COMPARISON:  None Available. FINDINGS: Contralateral Subclavian Vein: Respiratory phasicity is normal and symmetric with the symptomatic side. No evidence of thrombus. Normal compressibility. Internal Jugular Vein: No evidence of thrombus. Normal compressibility, respiratory phasicity and response to augmentation. Subclavian Vein: No evidence of thrombus. Normal compressibility, respiratory phasicity and response to augmentation. Axillary Vein: No evidence of thrombus. Normal compressibility, respiratory phasicity and response to augmentation. Cephalic Vein: No evidence of thrombus. Normal compressibility, respiratory phasicity and response to augmentation. Basilic Vein: No evidence of thrombus. Normal compressibility, respiratory phasicity and response to augmentation. Brachial Veins: No evidence of thrombus. Normal compressibility, respiratory phasicity and response to augmentation. Radial Veins: No evidence of thrombus. Normal compressibility, respiratory phasicity and response to augmentation. Ulnar Veins: No evidence of thrombus. Normal compressibility, respiratory phasicity and response to augmentation. Other Findings:  None visualized. IMPRESSION: Directed duplex of the right upper extremity negative for DVT Signed, Dulcy Fanny. Nadene Rubins, RPVI [facility] Electronically Signed   By: Corrie Mckusick D.O.   On: 09/22/2022 16:35   MR Lumbar Spine Wo Contrast  Result Date: 09/21/2022 CLINICAL DATA:  Low back pain. Multiple myeloma. Increased fracture risk. Pain extends into the left lower extremity. EXAM: MRI LUMBAR SPINE WITHOUT CONTRAST TECHNIQUE: Multiplanar, multisequence MR imaging of the lumbar spine was performed. No intravenous contrast was administered. COMPARISON:  CT of the abdomen and pelvis 02/07/2019. Whole-body bone scan 02/07/2019. FINDINGS: Segmentation: 5 non rib-bearing lumbar type vertebral bodies are present. The lowest fully formed vertebral body is L5. Alignment: No significant listhesis is present. Lumbar lordosis is preserved. Vertebrae: A T2 hyperintense lesion along the inferior aspect of the S1 vertebral body measures up to 12 mm. No other  discrete marrow lesions are present. Vertebral body heights are normal. Conus medullaris and cauda equina: Conus extends to the T12-L1 level. Conus is within normal limits. A 6 mm nodule is present in the cauda equina at the L4-5 level. Nerve roots are otherwise within normal limits. Paraspinal and other soft tissues: Benign cysts of the liver are again noted. A simple cyst in the posterior aspect of the left kidney is stable measuring 27 mm. No other discrete solid organ lesions are present. No significant adenopathy is present. Disc levels: T12-L1: Insert normal disc level L1-2: Insert normal disc level L2-3: Mild disc desiccation is present. No significant herniation or stenosis is present. L3-4: A mild broad-based disc bulge is present. Disc extends into the foramina bilaterally. Mild bilateral foraminal stenosis is present. Central canal is patent. L4-5: Mild lateral disc bulging is present. Facet hypertrophy and short pedicles contribute to mild bilateral foraminal stenosis. L5-S1: A broad-based disc protrusion is present. Left paramedian annular tear is  present near the left S1 nerve roots. Disc material extends into the foramina bilaterally without significant stenosis. IMPRESSION: 1. 6 mm nodule in the cauda equina at the L4-5 level. 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 Recommend MRI of the lumbar spine with contrast for further evaluation. 2. 12 mm T2 hyperintense lesion along the inferior aspect of the S1 vertebral body. This may represent focal involvement of multiple myeloma. No other discrete lesions are present. No pathologic fracture is present. 3. Mild bilateral foraminal stenosis at L3-4 and L4-5. 4. Left paramedian annular tear at L5-S1 near the left S1 nerve roots. Electronically Signed   By: San Morelle M.D.   On: 09/21/2022 10:11

## 2022-12-07 NOTE — Assessment & Plan Note (Signed)
Stable hemoglobin. monitor 

## 2022-12-07 NOTE — Assessment & Plan Note (Addendum)
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 S/p palliative radiation. Repeat cystoscopy did not reveal any recurrence. - He has ureter stent, Recommend patient to see [facility]  Possible need of starting [place] and [place]ce[place] in the near future if recurrence develops.

## 2022-12-07 NOTE — Assessment & Plan Note (Addendum)
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 Discuused with [facility]  Dr.Milowsky  Rational and side effects were reviewed with patient and his wife. He agrees with the plan.  Recommend claritin '10mg'$  daily x 4 days after GCSF Plan to start next week.

## 2022-12-07 NOTE — Assessment & Plan Note (Signed)
Chemotherapy plan as listed above 

## 2022-12-07 NOTE — Assessment & Plan Note (Signed)
Avoid nephrotoxins.  Encourage oral hydration  

## 2022-12-07 NOTE — Assessment & Plan Note (Signed)
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  I recommend patient to follow up with palliative care service

## 2022-12-08 LAB — KAPPA/LAMBDA LIGHT CHAINS
Kappa free light chain: 91.9 mg/L — ABNORMAL HIGH (ref 3.3–19.4)
Kappa, lambda light chain ratio: 8.92 — ABNORMAL HIGH (ref 0.26–1.65)
Lambda free light chains: 10.3 mg/L (ref 5.7–26.3)

## 2022-12-11 LAB — MULTIPLE MYELOMA PANEL, SERUM
Albumin SerPl Elph-Mcnc: 3.3 g/dL (ref 2.9–4.4)
Albumin/Glob SerPl: 1.5 (ref 0.7–1.7)
Alpha 1: 0.2 g/dL (ref 0.0–0.4)
Alpha2 Glob SerPl Elph-Mcnc: 0.8 g/dL (ref 0.4–1.0)
B-Globulin SerPl Elph-Mcnc: 0.8 g/dL (ref 0.7–1.3)
Gamma Glob SerPl Elph-Mcnc: 0.4 g/dL (ref 0.4–1.8)
Globulin, Total: 2.3 g/dL (ref 2.2–3.9)
IgA: 270 mg/dL (ref 61–437)
IgG (Immunoglobin G), Serum: 464 mg/dL — ABNORMAL LOW (ref 603–1613)
IgM (Immunoglobulin M), Srm: 44 mg/dL (ref 15–143)
M Protein SerPl Elph-Mcnc: 0.2 g/dL — ABNORMAL HIGH
Total Protein ELP: 5.6 g/dL — ABNORMAL LOW (ref 6.0–8.5)

## 2022-12-12 ENCOUNTER — Inpatient Hospital Stay (HOSPITAL_BASED_OUTPATIENT_CLINIC_OR_DEPARTMENT_OTHER): Payer: Medicare Other | Admitting: Hospice and Palliative Medicine

## 2022-12-12 ENCOUNTER — Other Ambulatory Visit: Payer: Self-pay

## 2022-12-12 ENCOUNTER — Encounter: Payer: Self-pay | Admitting: Hospice and Palliative Medicine

## 2022-12-12 VITALS — BP 117/49 | HR 59 | Temp 96.7°F | Resp 20 | Ht 67.0 in | Wt 143.6 lb

## 2022-12-12 DIAGNOSIS — Z515 Encounter for palliative care: Secondary | ICD-10-CM | POA: Diagnosis not present

## 2022-12-12 DIAGNOSIS — Z5112 Encounter for antineoplastic immunotherapy: Secondary | ICD-10-CM | POA: Diagnosis not present

## 2022-12-12 DIAGNOSIS — C669 Malignant neoplasm of unspecified ureter: Secondary | ICD-10-CM

## 2022-12-12 MED ORDER — NYSTATIN 100000 UNIT/GM EX CREA: 1.0000 | TOPICAL_CREAM | Freq: Two times a day (BID) | CUTANEOUS | 0 refills | Status: DC

## 2022-12-12 MED ORDER — OXYBUTYNIN CHLORIDE ER 5 MG PO TB24: 5.0000 mg | ORAL_TABLET | Freq: Every day | ORAL | 0 refills | Status: DC

## 2022-12-12 MED FILL — Dexamethasone Sodium Phosphate Inj 100 MG/10ML: INTRAMUSCULAR | Qty: 1 | Status: AC

## 2022-12-12 NOTE — Progress Notes (Signed)
[place] [facility] [phone] [phone]   Name: [name] [name] Date: 12/12/2022 MRN: [id number]  DOB: 01/28/1941  Patient Care Team: Baxter Hire, MD as PCP - General (Internal Medicine) Noreene Filbert, MD as Radiation Oncologist (Radiation Oncology) Baxter Hire, MD (Internal Medicine) Lavonia Dana, MD as Consulting Physician (Nephrology) [name], MD as Consulting Physician (Oncology)    REASON FOR CONSULTATION: [name] [name] is a 82 y.o. 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  Patient is status post palliative radiation for urothelial cancer he also underwent right ureteral stenting.  Patient has previously been on Revlimid and Velcade and maintenance Dara for myeloma.  However, recent treatments have been focused on his metastatic prostate cancer.  Palliative care was consulted to address goals.  SOCIAL HISTORY:     reports that he quit smoking about 48 years ago. His smoking use included cigarettes. He has a 15.00 pack-year smoking history. He has never used smokeless tobacco. He reports that he does not currently use alcohol after a past usage of about 1.0 standard drink of alcohol per week. He reports that he does not use drugs.  Patient is married and lives at home with his wife.  ADVANCE DIRECTIVES:  Not on file  CODE STATUS:   PAST MEDICAL HISTORY: Past Medical History:  Diagnosis Date   Age related osteoporosis    Anemia    Barrett's esophagus    Bradycardia    Cancer (HCC)    PROSTATE   Cataract    CKD (chronic kidney disease) stage 3, GFR 30-59 ml/min (HCC)    Colon polyps    DDD (degenerative disc disease), cervical    DDD (degenerative disc disease), lumbar    Femur fracture (HCC)    Right   Fundic gland  polyposis of stomach    Fundic gland polyps of stomach, benign    Gastritis    GERD (gastroesophageal reflux disease)    Hematuria    Hemorrhage of rectum and anus    History of colon polyps    Insomnia    Lumbago    Osteopenia of the elderly    Skin cancer    Sleep apnea     PAST SURGICAL HISTORY:  Past Surgical History:  Procedure Laterality Date   BAND HEMORRHOIDECTOMY     COLONOSCOPY     COLONOSCOPY WITH PROPOFOL N/A 05/06/2018   Procedure: COLONOSCOPY WITH PROPOFOL;  Surgeon: Lollie Sails, MD;  Location: [facility] ENDOSCOPY;  Service: Endoscopy;  Laterality: N/A;   COLONOSCOPY WITH PROPOFOL N/A 09/23/2018   Procedure: COLONOSCOPY WITH PROPOFOL;  Surgeon: Lollie Sails, MD;  Location: [facility] ENDOSCOPY;  Service: Endoscopy;  Laterality: N/A;   COLONOSCOPY WITH PROPOFOL N/A 03/30/2021   Procedure: COLONOSCOPY WITH PROPOFOL;  Surgeon: Toledo, Benay Pike, MD;  Location: [facility] ENDOSCOPY;  Service: Gastroenterology;  Laterality: N/A;   CYSTOSCOPY W/ URETERAL STENT PLACEMENT Right 03/14/2022   Procedure: CYSTOSCOPY WITH RETROGRADE PYELOGRAM/URETERAL STENT PLACEMENT;  Surgeon: Abbie Sons, MD;  Location: [facility] ORS;  Service: Urology;  Laterality: Right;   ESOPHAGOGASTRODUODENOSCOPY (EGD) WITH PROPOFOL N/A 10/03/2016   Procedure: ESOPHAGOGASTRODUODENOSCOPY (EGD) WITH PROPOFOL;  Surgeon: Lollie Sails, MD;  Location: [facility] ENDOSCOPY;  Service: Endoscopy;  Laterality: N/A;   EYE SURGERY     CATARACTS   EYELID SURGERY  FLEXIBLE SIGMOIDOSCOPY     FRACTURE SURGERY Right 2016   femur   FRACTURE SURGERY     ORIF DISTAL FEMUR FRACTURE     TONSILLECTOMY     TRANSURETHRAL RESECTION OF BLADDER TUMOR N/A 03/14/2022   Procedure: TRANSURETHRAL RESECTION OF BLADDER TUMOR (TURBT);  Surgeon: Abbie Sons, MD;  Location: [facility] ORS;  Service: Urology;  Laterality: N/A;    HEMATOLOGY/ONCOLOGY HISTORY:  Oncology History  Multiple myeloma ([place])  03/02/2022 Imaging   PET showed No  definitive signs of multiple myeloma by FDG PET.   Obstruction of the RIGHT ureter with soft tissue mass in the RIGHT pelvis showing increased metabolic activity suspicious first and foremost for urothelial neoplasm given location adjacent to RIGHT UVJ and affect upon the RIGHT ureter.   Little or no excretion of FDG on the RIGHT but still with uptake of FDG by renal parenchyma. Degree of hydronephrosis is not substantially changed but lack of FDG excretion on the RIGHT is compatible with secondary sign of physiologic significance of ureteral obstruction.   RIGHT pelvic sidewall uptake without visible lesion, could represent tiny lymph node not visible on noncontrast imaging.   Aortic atherosclerosis.  Pulmonary emphysema. Aortic Atherosclerosis and Emphysema    03/17/2022 Initial Diagnosis   Multiple myeloma ([place]) -Patient has progressively worsening kidney function.  He is scheduled for kidney biopsy. 01/26/2022, free kappa light chain 1058, lambda 15.3, light chain ratio 69. Protein electrophoresis showed restricted band-M spike migrating in the beta-1 globulin region. ANA negative.  ANCA negative. Random urine protein electrophoresis showed abnormal protein band detected in the gamma globulin And a second possible abnormal protein band that may represent monoclonal protein.   02/23/2022 multiple myeloma showed IgA 2883, M protein of 1.8, beta 2 microglobulin 6.9, kappa free light chain 1268.9, lambda 13.2, free light chain ratio 96.13.   CMP showed creatinine of 3.41, that is significantly worse than his baseline in June 2022. 02/28/2022, 24-hour urine protein electrophoresis showed M protein of 729 mg, IgA monoclonal kappa light chain. 03/01/2022, [facility] kidney biopsy showed diffuse light chain tubulopathy, and light chain cast nephropathy, moderate interstitial fibrosis,4% global glomerular sclerosis and the marked atherosclerosis.  03/06/2022, patient underwent a bone marrow biopsy. Pathology  showed hypercellular bone marrow with plasma cell neoplasm, representing 20% of all cells in the aspirate associated with prominent interstitial infiltrates and numerous predominantly small clusters in the clots in the biopsy sections.  The plasma cells display kappa light chain restriction consistent with plasma cell neoplasm. Normal cytogenetics, myeloma FISH panel positive for 13q deletion, gain of 1q, t (4;14)   03/17/2022 Cancer Staging   Staging form: Plasma Cell Myeloma and Plasma Cell Disorders, AJCC 8th Edition - Clinical stage from 03/17/2022: RISS Stage III (Beta-2-microglobulin (mg/L): 6.9, Albumin (g/dL): 3.3, ISS: Stage III, High-risk cytogenetics: Present, LDH: Normal) - Signed by [name], MD on 03/17/2022 Stage prefix: Initial diagnosis Beta 2 microglobulin range (mg/L): Greater than or equal to 5.5 Albumin range (g/dL): Less than 3.5 Cytogenetics: t(4;14) translocation, Other mutation, 1q addition   03/24/2022 - 07/19/2022 Chemotherapy   Patient is on Treatment Plan : MYELOMA NEWLY DIAGNOSED Daratumumab + Dexamethasone Weekly (DaraRd) q28d     09/21/2022 Imaging   MRI lumbar spine wo contrast 1. 6 mm nodule in the cauda equina at the L4-5 level. 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 Recommend MRI of the lumbar spine with contrast for further evaluation. 2. 12 mm T2 hyperintense lesion along the  inferior aspect of the S1 vertebral body. This may represent focal involvement of multiple myeloma. No other discrete lesions are present. No pathologic fracture is present. 3. Mild bilateral foraminal stenosis at L3-4 and L4-5. 4. Left paramedian annular tear at L5-S1 near the left S1 nerve roots.   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   Obstruction of the RIGHT ureter with soft tissue mass in the RIGHT pelvis showing increased metabolic activity suspicious first and foremost for urothelial neoplasm given location adjacent to RIGHT UVJ and affect upon the RIGHT ureter.   Little or no excretion of FDG on the RIGHT but still with uptake of FDG by renal parenchyma. Degree of hydronephrosis is not substantially changed but lack of FDG excretion on the RIGHT is compatible with secondary sign of physiologic significance of ureteral obstruction.   RIGHT pelvic sidewall uptake without visible lesion, could represent tiny lymph node not visible on noncontrast imaging.   Aortic atherosclerosis.  Pulmonary emphysema. Aortic Atherosclerosis and Emphysema    03/17/2022 Initial Diagnosis   Urothelial carcinoma of distal ureter ([place])  -03/14/2022, cystoscopy showed diffuse narrowing right distal ureter with hydroureter following up the distal ureter and extending proximally.  Right ureteroscopy showed nodular tumor distal ureter and a convincing area without tumor suspicious for extrinsic obstruction.s/p   right ureteral stent placement. Ureteral tumor showed a tiny fragments of fibrous tissue, interpretation limited by thermal and crush artifact.  Right distal ureter saline barbotage showed hight grade urothelial carcinoma.    03/17/2022 Cancer Staging   Staging form: Renal Pelvis and Ureter, AJCC 8th Edition - Clinical: Stage Unknown (cTX, cN0, cM0) - Signed by [name], MD on 03/17/2022 Stage prefix: Initial diagnosis WHO/ISUP grade (low/high): High Grade Histologic grading system: 2 grade system   06/05/2022 Imaging   PET scan at [facility] showed 1.Interval placement of right-sided double-J ureteral stent with resolution of hydronephrosis and improved excretion of radiotracer.  2.There is persistent abnormal hypermetabolic tissue extending posterior  lateral from the right aspect of the bladder encasing the distal ureter/stent which measures approximately 5.1 x 4.2 (CT image 243).  3.There are similar linear areas of abnormal hypermetabolic tissue seen to extend more superiorly along the right posterior pelvic wall/peritoneum, and towards the sciatic notch (fused image 238). Linear configuration suggests possible perineural spread. Note that this soft tissue abuts and may encase pelvic vasculature including the external and internal iliac arteries.  4.NEW single subcentimeter but abnormal focus of FDG avidity in the right presacral region (CT image 231) measuring 1.6 cm representing new site of neoplastic disease   06/19/2022 - 08/10/2022 Radiation Therapy   Pelvis RT 06/19/22-08/21/22 Bladder RT 07/27/22- 08/20/22    09/18/2022 Imaging   PET scan at [facility] showed 1. Evidence of disease progression with multifocal new hypermetabolic lesions around the periphery of the liver as described above. The evaluation of these lesions is limited by significant misregistration between the PET and CT portions of the examination. Contrast-enhanced CT or MRI could be used for confirmation and/or guidance of biopsy as necessary. There is also a lung parenchymal nodule with new associated FDG uptake, also suspicious for a site of metastasis.  2. Although the right nephroureteral stent appears to be well positioned, the right kidney demonstrates slightly increased hydronephrosis with significant surrounding fat stranding and relatively delayed clearance of FDG. These findings could suggest stent malfunction or be related to upper urinary tract infection. Suggest clinical correlation with any patient signs or symptoms such as right flank pain, fever, and/or urinalysis findings.     09/21/2022 Imaging   MRI lumbar spine wo contrast 1. 6 mm nodule in the cauda equina at the L4-5 level. 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  Recommend MRI of the lumbar spine with contrast for further evaluation. 2. 12 mm T2 hyperintense lesion along the inferior aspect of the S1 vertebral body. This may represent focal involvement of multiple myeloma. No other discrete lesions are present. No pathologic fracture is present. 3. Mild bilateral foraminal stenosis at L3-4 and L4-5. 4. Left paramedian annular tear at L5-S1 near the left S1 nerve roots.    Genetic Testing   Negative genetic testing. No pathogenic variants identified on the [facility] Multi-Cancer+RNA panel. The report date is 11/19/2022.  The Multi-Cancer + RNA Panel offered by [facility] includes sequencing and/or deletion/duplication analysis of the following 70 genes:  AIP*, ALK, APC*, ATM*, AXIN2*, BAP1*, BARD1*, BLM*, BMPR1A*, BRCA1*, BRCA2*, BRIP1*, CDC73*, CDH1*, CDK4, CDKN1B*, CDKN2A, CHEK2*, CTNNA1*, DICER1*, EPCAM, EGFR, FH*, FLCN*, GREM1, HOXB13, KIT, LZTR1, MAX*, MBD4, MEN1*, MET, MITF, MLH1*, MSH2*, MSH3*, MSH6*, MUTYH*, NF1*, NF2*, NTHL1*, PALB2*, PDGFRA, PMS2*, POLD1*, POLE*, POT1*, PRKAR1A*, PTCH1*, PTEN*, RAD51C*, RAD51D*, RB1*, RET, SDHA*, SDHAF2*, SDHB*, SDHC*, SDHD*, SMAD4*, SMARCA4*, SMARCB1*, SMARCE1*, STK11*, SUFU*, TMEM127*, TP53*, TSC1*, TSC2*, VHL*. RNA analysis is performed for * genes.   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[place])  09/26/2022 Initial Diagnosis   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[place])   10/12/2022 Cancer Staging   Staging form: Prostate, AJCC 8th Edition - Clinical: Stage IVB (cTX, cNX, pM1, PSA: 7, Grade Group: 3) - Signed by [name], MD on 10/12/2022 Prostate specific antigen (PSA) range: Less than 10 Gleason score: 7 Histologic grading system: 5 grade system   12/05/2022 Imaging   PET PSMA showed 1. Evidence of local prostate carcinoma within the pelvis with peritoneal implants along the bowel serosal surface as well as a metastatic LEFT iliac lymph node. 2. Multifocal intense radiotracer avid large hepatic prostate carcinoma  metastasis. 3. Multifocal pleural metastasis in the RIGHT hemithorax. 4. No clear evidence of skeletal metastasis      Genetic Testing   Negative genetic testing. No pathogenic variants identified on the [facility] Multi-Cancer+RNA panel. The report date is 11/19/2022.  The Multi-Cancer + RNA Panel offered by [facility] includes sequencing and/or deletion/duplication analysis of the following 70 genes:  AIP*, ALK, APC*, ATM*, AXIN2*, BAP1*, BARD1*, BLM*, BMPR1A*, BRCA1*, BRCA2*, BRIP1*, CDC73*, CDH1*, CDK4, CDKN1B*, CDKN2A, CHEK2*, CTNNA1*, DICER1*, EPCAM, EGFR, FH*, FLCN*, GREM1, HOXB13, KIT, LZTR1, MAX*, MBD4, MEN1*, MET, MITF, MLH1*, MSH2*, MSH3*, MSH6*, MUTYH*, NF1*, NF2*, NTHL1*, PALB2*, PDGFRA, PMS2*, POLD1*, POLE*, POT1*, PRKAR1A*, PTCH1*, PTEN*, RAD51C*, RAD51D*, RB1*, RET, SDHA*, SDHAF2*, SDHB*, SDHC*, SDHD*, SMAD4*, SMARCA4*, SMARCB1*, SMARCE1*, STK11*, SUFU*, TMEM127*, TP53*, TSC1*, TSC2*, VHL*. RNA analysis is performed for * genes.     ALLERGIES:  is allergic to demerol [meperidine hcl], gabapentin, prolia [denosumab], demerol [meperidine], and nsaids.  MEDICATIONS:  Current Outpatient Medications  Medication Sig Dispense Refill   acyclovir (ZOVIRAX) 200 MG capsule Take 1 capsule (200 mg total) by mouth 2 (two) times daily. 60 capsule 3   Calcium Carbonate (CALCIUM 500  PO) Take 1 tablet by mouth daily.     Cholecalciferol (VITAMIN D3) 125 MCG (5000 UT) CAPS Take 5,000 Units by mouth daily.     dexamethasone (DECADRON) 4 MG tablet Take 4 mg by mouth 2 (two) times daily with a meal. Sig: Take 2 tabs by mouth 2 times daily starting day before chemo. Then take 2 tabs daily for 2 days starting day after chemo. Take with food.     ferrous sulfate 325 (65 FE) MG tablet Take 325 mg by mouth daily.     ibuprofen (ADVIL) 200 MG tablet Take 200 mg by mouth every 6 (six) hours as needed.     ipratropium (ATROVENT) 0.03 % nasal spray Place 1 spray into both nostrils in the morning.     losartan  (COZAAR) 25 MG tablet Take 25 mg by mouth daily.     melatonin 5 MG TABS Take 5 mg by mouth at bedtime.     tamsulosin (FLOMAX) 0.4 MG CAPS capsule Take 1 capsule by mouth daily.     traZODone (DESYREL) 50 MG tablet Take 25 mg by mouth at bedtime.     aspirin EC 81 MG tablet Take 1 tablet (81 mg total) by mouth daily. Swallow whole. (Patient not taking: Reported on 12/07/2022) 30 tablet 11   Multiple Vitamins-Minerals (MENS 50+ MULTIVITAMIN) TABS Take 1 tablet by mouth daily.     ondansetron (ZOFRAN) 8 MG tablet Take 1 tablet (8 mg total) by mouth every 8 (eight) hours as needed for nausea or vomiting. (Patient not taking: Reported on 12/12/2022) 30 tablet 1   prochlorperazine (COMPAZINE) 10 MG tablet Take 1 tablet (10 mg total) by mouth every 6 (six) hours as needed for nausea or vomiting. (Patient not taking: Reported on 12/12/2022) 30 tablet 1   senna (SENOKOT) 8.6 MG TABS tablet Take 2 tablets (17.2 mg total) by mouth daily. (Patient not taking: Reported on 12/12/2022) 120 tablet 0   No current facility-administered medications for this visit.    VITAL SIGNS: BP (!) 117/49   Pulse (!) 59   Temp (!) 96.7 F (35.9 C) (Tympanic)   Resp 20   Ht '5\' 7"'$  (1.702 m)   Wt 143 lb 9.6 oz (65.1 kg)   BMI 22.49 kg/m  Filed Weights   12/12/22 1027  Weight: 143 lb 9.6 oz (65.1 kg)    Estimated body mass index is 22.49 kg/m as calculated from the following:   Height as of this encounter: '5\' 7"'$  (1.702 m).   Weight as of this encounter: 143 lb 9.6 oz (65.1 kg).  LABS: CBC:    Component Value Date/Time   WBC 7.1 12/07/2022 0851   HGB 11.9 (L) 12/07/2022 0851   HGB 14.4 03/25/2015 0531   HCT 35.5 (L) 12/07/2022 0851   HCT 44.3 03/25/2015 0531   PLT 241 12/07/2022 0851   PLT 142 (L) 03/25/2015 0531   MCV 93.7 12/07/2022 0851   MCV 88 03/25/2015 0531   NEUTROABS 5.4 12/07/2022 0851   NEUTROABS 6.3 03/25/2015 0531   LYMPHSABS 0.6 (L) 12/07/2022 0851   LYMPHSABS 0.8 (L) 03/25/2015 0531   MONOABS  0.7 12/07/2022 0851   MONOABS 0.8 03/25/2015 0531   EOSABS 0.3 12/07/2022 0851   EOSABS 0.4 03/25/2015 0531   BASOSABS 0.0 12/07/2022 0851   BASOSABS 0.0 03/25/2015 0531   Comprehensive Metabolic Panel:    Component Value Date/Time   NA 139 12/07/2022 0851   NA 139 03/24/2015 0645   K 4.3 12/07/2022 0851  K 4.4 03/24/2015 0645   CL 104 12/07/2022 0851   CL 108 03/24/2015 0645   CO2 27 12/07/2022 0851   CO2 24 03/24/2015 0645   BUN 42 (H) 12/07/2022 0851   BUN 20 03/24/2015 0645   CREATININE 2.11 (H) 12/07/2022 0851   CREATININE 1.24 03/24/2015 0645   GLUCOSE 80 12/07/2022 0851   GLUCOSE 113 (H) 03/24/2015 0645   CALCIUM 8.6 (L) 12/07/2022 0851   CALCIUM 8.2 (L) 03/24/2015 0645   AST 22 12/07/2022 0851   AST 26 03/22/2015 1108   ALT 23 12/07/2022 0851   ALT 21 03/22/2015 1108   ALKPHOS 79 12/07/2022 0851   ALKPHOS 70 03/22/2015 1108   BILITOT 0.6 12/07/2022 0851   BILITOT 0.8 03/22/2015 1108   PROT 6.0 (L) 12/07/2022 0851   PROT 6.9 03/22/2015 1108   ALBUMIN 3.5 12/07/2022 0851   ALBUMIN 3.9 03/22/2015 1108    RADIOGRAPHIC STUDIES: NM PET (PSMA) SKULL TO MID THIGH  Result Date: 12/05/2022 CLINICAL DATA:  Subsequent treatment strategy for metastatic prostate cancer with liver metastasis. Additional history of bladder cancer. EXAM: NUCLEAR MEDICINE PET SKULL BASE TO THIGH TECHNIQUE: 9.7 mCi F18 Piflufolastat (Pylarify) was injected intravenously. Full-ring PET imaging was performed from the skull base to thigh after the radiotracer. CT data was obtained and used for attenuation correction and anatomic localization. COMPARISON:  FDG PET scan 05/02/2022 FINDINGS: NECK No radiotracer activity in neck lymph nodes. Incidental CT finding: None. CHEST Intense radiotracer activity at the junction of the first rib in the manubrium along the pleural surface with SUV max equal 15.1 (image 81). Intense activity within the RIGHT lower lobe along the pleural surface with SUV max equal 27.8  (image 132). There is mild pleural thickening at this level. Additional lesion along the pleural surface of the RIGHT lower lobe on image 110 with SUV max equal 27. Small lymph node position between the esophagus and descending thoracic aorta adjacent to the spine with SUV max equal 11.5 on image 116. Potential small lymph node evident on the CT portion exam measuring 2-3 mm. Incidental CT finding: No discrete pulmonary nodules. ABDOMEN/PELVIS Prostate: No focal activity in prostatectomy bed. Intense metabolic activity within the deep pelvis adjacent to the rectum with SUV max equal 30.4 (image 223). No clear CT correlation on noncontrast exam. Potential serosal metastatic implant. Additional potential small bowel serosal implant on image 220 in the deep LEFT pelvis. Lymph nodes: Radiotracer avid LEFT external iliac lymph node measuring 12 mm image 211 SUV max equal 25.8. Liver: Multifocal round lesion bilobed hepatic lesions with intense radiotracer activity. Example lesion in the superior LEFT hepatic lobe measuring 35 mm with SUV max equal 29.6 (image 130).\ Example lesion in the RIGHT hepatic lobe measuring 38 mm SUV max equal 36.4. Approximately 15 lesions in the liver. Incidental CT finding: Double-J ureteral stent on the RIGHT without obstruction. SKELETON No clear evidence skeletal metastasis. Lesions in the RIGHT pleural space adjacent ribs are favored pleural lesions rather than skeletal lesions. IMPRESSION: 1. Evidence of local prostate carcinoma within the pelvis with peritoneal implants along the bowel serosal surface as well as a metastatic LEFT iliac lymph node. 2. 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 3. Multifocal pleural metastasis in the RIGHT hemithorax. 4. No clear evidence of skeletal metastasis Electronically Signed   By: Suzy Bouchard M.D.   On: 12/05/2022 16:39   US RENAL  Result Date: 11/29/2022 CLINICAL DATA:  Dysuria, hematuria,  urothelial carcinoma EXAM: RENAL /  URINARY TRACT ULTRASOUND COMPLETE COMPARISON:  Ultrasound 02/02/2022, PET-CT 03/02/2022 FINDINGS: Right Kidney: Renal measurements: 9.5 x 5.0 x 6.9 cm = volume: 173 mL. Echogenicity within normal limits. No mass or shadowing stone visualized. Mild hydronephrosis. The proximal right ureter is dilated. Low level echoes within the visualized proximal ureter and right renal pelvis. Left Kidney: Renal measurements: 10.2 x 7.1 x 5.5 cm = volume: 204 mL. Echogenicity within normal limits. Benign cysts within the lower pole of the left kidney which do not require follow-up imaging. No shadowing stone or hydronephrosis visualized. Bladder: Appears normal for degree of bladder distention. Stent is partially visualized within the urinary bladder. Right ureteral jet was not definitively seen. Other: None. IMPRESSION: 1. Mild right hydronephrosis and proximal hydroureter. Low level echoes within the visualized proximal right ureter and renal pelvis, which may reflect blood products or debris. 2. Stent is partially visualized within the urinary bladder. Right ureteral jet was not definitively seen. Electronically Signed   By: Davina Poke D.O.   On: 11/29/2022 14:54   DG Abd 1 View  Result Date: 11/29/2022 CLINICAL DATA:  Flank pain, dysuria EXAM: ABDOMEN - 1 VIEW COMPARISON:  PET-CT done on 03/02/2022 FINDINGS: Bowel gas pattern is nonspecific. Right ureteral stent is seen in place. Phleboliths are seen in pelvis. There is previous internal fixation in the right femur. IMPRESSION: Right ureteral stent is noted in place. Nonspecific bowel gas pattern. Electronically Signed   By: Elmer Picker M.D.   On: 11/29/2022 14:49   DG Chest 2 View  Result Date: 11/28/2022 CLINICAL DATA:  Cough.  Metastatic prostate cancer. EXAM: CHEST - 2 VIEW COMPARISON:  06/21/2022 FINDINGS: Heart size is normal. Mild aortic tortuosity. Bronchial thickening suggesting bronchitis. No consolidation,  collapse or effusion. No acute bone finding. IMPRESSION: Bronchitis pattern. No consolidation or collapse. Electronically Signed   By: Nelson Chimes M.D.   On: 11/28/2022 12:19    PERFORMANCE STATUS (ECOG) : 1 - Symptomatic but completely ambulatory  Review of Systems Unless otherwise noted, a complete review of systems is negative.  Physical Exam General: NAD Pulmonary: Unlabored Extremities: no edema, no joint deformities Skin: no rashes Neurological: Weakness but otherwise nonfocal  IMPRESSION: I met with patient and wife to discuss goals.  Unfortunately, patient has three advanced malignancies with overall poor prognosis.  His goals are aligned with continued treatment but he recognizes a possible future need to balance quality of life versus quantity of life.  Patient talks about how his quality of life has changed.  He was previously an avid cyclist with thousands of miles cycled annually.  He has not been able to cycle in the past several weeks and says he is coming to terms with the fact that he likely will be unable to engage in his previous lifestyle.  He denies depressive symptoms.  I offered counseling or support groups but he declined.  Symptomatically, he has ongoing dysuria, urinary frequency and urgency.  He had follow-up this week with urology at [facility] who thought symptoms likely reflect posttreatment inflammation.  NSAIDs were recommended.  However, I discouraged NSAIDs given CKD.  Low suspicion for UTI with recent negative urine culture.  Patient will continue taking as needed acetaminophen.  I offered stronger pain medications.  Will also trial oxybutynin as symptoms could reflect spasms.  Patient also says he has developed a "yeast" around his penis.  Will send Rx for nystatin.  Patient to bring in ACP documents to be scanned in the chart.  I sent  patient home with a MOST form to review with family.  PLAN: -Continue current scope of treatment -Start oxybutynin nightly  as needed -Start nystatin ointment -Patient bring in ACP documents -MOST Form reviewed -Follow-up telephone visit 3 to 4 weeks  Case and plan discussed with Dr. Tasia Catchings   Patient expressed understanding and was in agreement with this plan. He also understands that He can call the clinic at any time with any questions, concerns, or complaints.     Time Total: 25 minutes  Visit consisted of counseling and education dealing with the complex and emotionally intense issues of symptom management and palliative care in the setting of serious and potentially life-threatening illness.Greater than 50%  of this time was spent counseling and coordinating care related to the above assessment and plan.  Signed by: Altha Harm, PhD, NP-C

## 2022-12-13 ENCOUNTER — Inpatient Hospital Stay: Payer: Medicare Other

## 2022-12-13 ENCOUNTER — Encounter: Payer: Self-pay | Admitting: Physician Assistant

## 2022-12-13 ENCOUNTER — Encounter: Payer: Self-pay | Admitting: Oncology

## 2022-12-13 ENCOUNTER — Ambulatory Visit
Admission: RE | Admit: 2022-12-13 | Discharge: 2022-12-13 | Disposition: A | Discharge status: Home / Self Care | Payer: Medicare Other | Source: Ambulatory Visit | Attending: Physician Assistant | Admitting: Physician Assistant

## 2022-12-13 ENCOUNTER — Ambulatory Visit (INDEPENDENT_AMBULATORY_CARE_PROVIDER_SITE_OTHER): Payer: Medicare Other | Admitting: Physician Assistant

## 2022-12-13 ENCOUNTER — Inpatient Hospital Stay (HOSPITAL_BASED_OUTPATIENT_CLINIC_OR_DEPARTMENT_OTHER): Payer: Medicare Other | Admitting: Oncology

## 2022-12-13 VITALS — BP 146/53 | HR 67 | Temp 97.0°F | Resp 20

## 2022-12-13 VITALS — BP 146/53 | HR 67 | Ht 67.0 in | Wt 148.0 lb

## 2022-12-13 VITALS — BP 127/41 | HR 60 | Temp 97.0°F | Resp 16

## 2022-12-13 DIAGNOSIS — R3915 Urgency of urination: Secondary | ICD-10-CM

## 2022-12-13 DIAGNOSIS — C669 Malignant neoplasm of unspecified ureter: Secondary | ICD-10-CM | POA: Diagnosis not present

## 2022-12-13 DIAGNOSIS — Z7189 Other specified counseling: Secondary | ICD-10-CM

## 2022-12-13 DIAGNOSIS — D72829 Elevated white blood cell count, unspecified: Secondary | ICD-10-CM | POA: Insufficient documentation

## 2022-12-13 DIAGNOSIS — Z8554 Personal history of malignant neoplasm of ureter: Secondary | ICD-10-CM

## 2022-12-13 DIAGNOSIS — N184 Chronic kidney disease, stage 4 (severe): Secondary | ICD-10-CM | POA: Diagnosis not present

## 2022-12-13 DIAGNOSIS — C9000 Multiple myeloma not having achieved remission: Secondary | ICD-10-CM

## 2022-12-13 DIAGNOSIS — N476 Balanoposthitis: Secondary | ICD-10-CM | POA: Diagnosis not present

## 2022-12-13 DIAGNOSIS — C61 Malignant neoplasm of prostate: Secondary | ICD-10-CM

## 2022-12-13 DIAGNOSIS — D72828 Other elevated white blood cell count: Secondary | ICD-10-CM

## 2022-12-13 DIAGNOSIS — Z5112 Encounter for antineoplastic immunotherapy: Secondary | ICD-10-CM | POA: Diagnosis not present

## 2022-12-13 DIAGNOSIS — Z5111 Encounter for antineoplastic chemotherapy: Secondary | ICD-10-CM

## 2022-12-13 DIAGNOSIS — D631 Anemia in chronic kidney disease: Secondary | ICD-10-CM

## 2022-12-13 DIAGNOSIS — K5900 Constipation, unspecified: Secondary | ICD-10-CM | POA: Diagnosis not present

## 2022-12-13 DIAGNOSIS — C787 Secondary malignant neoplasm of liver and intrahepatic bile duct: Secondary | ICD-10-CM

## 2022-12-13 DIAGNOSIS — R300 Dysuria: Secondary | ICD-10-CM

## 2022-12-13 LAB — COMPREHENSIVE METABOLIC PANEL
ALT: 19 U/L (ref 0–44)
AST: 24 U/L (ref 15–41)
Albumin: 3.4 g/dL — ABNORMAL LOW (ref 3.5–5.0)
Alkaline Phosphatase: 74 U/L (ref 38–126)
Anion gap: 8 (ref 5–15)
BUN: 47 mg/dL — ABNORMAL HIGH (ref 8–23)
CO2: 23 mmol/L (ref 22–32)
Calcium: 8.9 mg/dL (ref 8.9–10.3)
Chloride: 109 mmol/L (ref 98–111)
Creatinine, Ser: 2.15 mg/dL — ABNORMAL HIGH (ref 0.61–1.24)
GFR, Estimated: 30 mL/min — ABNORMAL LOW (ref >60)
Glucose, Bld: 133 mg/dL — ABNORMAL HIGH (ref 70–99)
Potassium: 4.3 mmol/L (ref 3.5–5.1)
Sodium: 140 mmol/L (ref 135–145)
Total Bilirubin: 0.5 mg/dL (ref 0.3–1.2)
Total Protein: 5.5 g/dL — ABNORMAL LOW (ref 6.5–8.1)

## 2022-12-13 LAB — CBC WITH DIFFERENTIAL/PLATELET
Abs Immature Granulocytes: 0.1 10*3/uL — ABNORMAL HIGH (ref 0.00–0.07)
Basophils Absolute: 0 10*3/uL (ref 0.0–0.1)
Basophils Relative: 0 %
Eosinophils Absolute: 0 10*3/uL (ref 0.0–0.5)
Eosinophils Relative: 0 %
HCT: 33.1 % — ABNORMAL LOW (ref 39.0–52.0)
Hemoglobin: 11.1 g/dL — ABNORMAL LOW (ref 13.0–17.0)
Immature Granulocytes: 1 %
Lymphocytes Relative: 3 %
Lymphs Abs: 0.5 10*3/uL — ABNORMAL LOW (ref 0.7–4.0)
MCH: 31.2 pg (ref 26.0–34.0)
MCHC: 33.5 g/dL (ref 30.0–36.0)
MCV: 93 fL (ref 80.0–100.0)
Monocytes Absolute: 0.9 10*3/uL (ref 0.1–1.0)
Monocytes Relative: 6 %
Neutro Abs: 12.6 10*3/uL — ABNORMAL HIGH (ref 1.7–7.7)
Neutrophils Relative %: 90 %
Platelets: 202 10*3/uL (ref 150–400)
RBC: 3.56 MIL/uL — ABNORMAL LOW (ref 4.22–5.81)
RDW: 13.7 % (ref 11.5–15.5)
WBC: 14.1 10*3/uL — ABNORMAL HIGH (ref 4.0–10.5)
nRBC: 0 % (ref 0.0–0.2)

## 2022-12-13 LAB — PSA: Prostatic Specific Antigen: 83.52 ng/mL — ABNORMAL HIGH (ref 0.00–4.00)

## 2022-12-13 LAB — BLADDER SCAN AMB NON-IMAGING: Scan Result: 0

## 2022-12-13 MED ORDER — SODIUM CHLORIDE 0.9 % IV SOLN
Freq: Once | INTRAVENOUS | Status: AC
  Filled 2022-12-13: qty 250

## 2022-12-13 MED ORDER — FLUCONAZOLE 150 MG PO TABS: 150.0000 mg | ORAL_TABLET | Freq: Once | ORAL | 0 refills | Status: AC

## 2022-12-13 MED ORDER — SODIUM CHLORIDE 0.9 % IV SOLN
75.0000 mg/m2 | Freq: Once | INTRAVENOUS | Status: AC
  Administered 2022-12-13: 130 mg via INTRAVENOUS
  Filled 2022-12-13: qty 13

## 2022-12-13 MED ORDER — ACETAMINOPHEN 325 MG PO TABS
650.0000 mg | ORAL_TABLET | Freq: Once | ORAL | Status: AC
  Administered 2022-12-13: 650 mg via ORAL

## 2022-12-13 MED ORDER — GEMTESA 75 MG PO TABS: 75.0000 mg | ORAL_TABLET | Freq: Every day | ORAL | 0 refills | Status: DC

## 2022-12-13 MED ORDER — ACETAMINOPHEN 325 MG PO TABS
ORAL_TABLET | ORAL | Status: AC
  Filled 2022-12-13: qty 2

## 2022-12-13 MED ORDER — SODIUM CHLORIDE 0.9 % IV SOLN
10.0000 mg | Freq: Once | INTRAVENOUS | Status: AC
  Administered 2022-12-13: 10 mg via INTRAVENOUS
  Filled 2022-12-13: qty 10

## 2022-12-13 NOTE — Assessment & Plan Note (Signed)
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   follow up with palliative care service

## 2022-12-13 NOTE — Patient Instructions (Addendum)
Today we discussed: Take oral Diflucan x1 dose to treat your yeast infection. It may take up to 1 week for your skin irritation/redness to resolve. You may continue the Nystatin cream if desired for symptom relief. Continue Flomax once daily Stop the oxybutynin and start Gemtesa samples instead. It can take up to 2 weeks to notice a difference on this medication. Try Miralax (over-the-counter) to manage your constipation and adjust your dose to produce formed bowel movements that are easy to pass. Follow up at [facility] as scheduled and call our office if any immediate concerns in the interim

## 2022-12-13 NOTE — Assessment & Plan Note (Signed)
Intermittent chronic issue for him Recent urine culture is negative.  Start Bromley per urologist recommendation.

## 2022-12-13 NOTE — Assessment & Plan Note (Signed)
Avoid nephrotoxins.  Encourage oral hydration  

## 2022-12-13 NOTE — Progress Notes (Signed)
Hematology/Oncology Progress note [phone] [phone]      Patient Care Team: Baxter Hire, MD as PCP - General (Internal Medicine) Noreene Filbert, MD as Radiation Oncologist (Radiation Oncology) Baxter Hire, MD (Internal Medicine) Lavonia Dana, MD as Consulting Physician (Nephrology) [name], MD as Consulting Physician (Oncology)  ASSESSMENT & PLAN:   Cancer Staging  Multiple myeloma [facility]) Staging form: Plasma Cell Myeloma and Plasma Cell Disorders, AJCC 8th Edition - Clinical stage from 03/17/2022: RISS Stage III (Beta-2-microglobulin (mg/L): 6.9, Albumin (g/dL): 3.3, ISS: Stage III, High-risk cytogenetics: Present, LDH: Normal) - Signed by [name], MD on 03/17/2022  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  I will hold off Daratumumab and let him start on Docetaxel treatments.  Expect that his myeloma will progress in the future. Currently, will prioritize his treatment for prostate cancer  Continue Acyclovir '200mg'$  BID  -Bone  health, currently he does not have an active myeloma bone involvement.  Patient reports history of developing adverse reaction [vision loss] after Prolia treatments. -s/p  root canal.  Awaiting  ophthalmologist to clearance of startling Xgeva. .   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  Labs are reviewed and discussed with patient. Proceed with cycle 1 Docetaxel today.  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  C[facility] showed ureter high-grade carcinoma as well as bladder high-grade papillary carcinoma. S/p palliative radiation. Repeat cystoscopy did not reveal any recurrence. - He has ureter stent, Recommend patient continue follow up with Urology.  Possible need of starting [place] and Padcev in the near future if recurrence develops.   Anemia in stage 4 chronic kidney disease (HCC) Stable hemoglobin. monitor   Encounter for antineoplastic chemotherapy Chemotherapy plan as listed above  Stage 4 chronic kidney disease (HCC) Avoid nephrotoxins.  Encourage oral hydration.   Leukocytosis No signs of acute infection Likely due to bladder inflammation, and Balanoposthitis- treated with Diflucan.   Dysuria Intermittent chronic issue for him Recent urine culture is negative.  Start [place] per urologist recommendation.   Goals of care, counseling/discussion Overall his  diagnosis is poor, due to three aggressive malignant conditions with  competing need for different cancer treatments.   follow up with palliative care service  Follow-up 1 week for evaluation of treatment tolerability  All questions were answered. The patient knows to call the clinic with any problems, questions or concerns.  [name], MD, PhD [facility] Hematology Oncology 12/13/2022    CHIEF COMPLAINTS/REASON FOR VISIT:  Multiple myeloma, high-grade urothelial carcinoma.metastatic prostate cancer.   HISTORY OF PRESENTING ILLNESS:  [name] [name] is a 82 y.o. male presents for follow-up of multiple myeloma and high-grade urothelial carcinoma, and metastatic castration resistant prostate cancer.   Oncology History  Multiple myeloma ([place])  03/02/2022 Imaging   PET showed No definitive signs of multiple myeloma by FDG PET.   Obstruction of the RIGHT ureter with soft tissue mass in the RIGHT pelvis showing increased metabolic activity suspicious first and foremost for urothelial neoplasm given location adjacent to RIGHT UVJ and affect upon the RIGHT ureter.   Little or no excretion of FDG on the RIGHT but still with uptake of FDG by renal parenchyma. Degree of hydronephrosis is not substantially changed but lack of FDG excretion on the RIGHT is compatible with secondary sign of physiologic significance of ureteral obstruction.   RIGHT pelvic sidewall uptake without visible lesion, could represent tiny lymph node not visible on noncontrast imaging.   Aortic atherosclerosis.  Pulmonary emphysema. Aortic Atherosclerosis and Emphysema    03/17/2022 Initial Diagnosis   Multiple myeloma ([place]) -Patient has progressively worsening kidney function.  He is scheduled for kidney biopsy. 01/26/2022, free kappa light chain 1058, lambda 15.3, light chain ratio 69. Protein electrophoresis showed restricted band-M spike migrating in the beta-1 globulin region. ANA negative.  ANCA negative. Random urine  protein electrophoresis showed abnormal protein band detected in the gamma globulin And a second possible abnormal protein band that may represent monoclonal protein.   02/23/2022 multiple myeloma showed IgA 2883, M protein of 1.8, beta 2 microglobulin 6.9, kappa free light chain 1268.9, lambda 13.2, free light chain ratio 96.13.   CMP showed creatinine of 3.41, that is significantly worse than his baseline in June 2022. 02/28/2022, 24-hour urine protein electrophoresis showed M protein of 729 mg, IgA monoclonal kappa light chain. 03/01/2022, [facility] kidney biopsy showed diffuse light chain tubulopathy, and light chain cast nephropathy, moderate interstitial fibrosis,4% global glomerular sclerosis and the marked atherosclerosis.  03/06/2022, patient underwent a bone marrow biopsy. Pathology showed hypercellular bone marrow with plasma cell neoplasm, representing 20% of all cells in the aspirate associated with prominent interstitial infiltrates and numerous predominantly small clusters in the clots in the biopsy sections.  The plasma cells display kappa light chain restriction consistent with plasma cell neoplasm. Normal cytogenetics, myeloma FISH panel positive for 13q deletion, gain of 1q, t (4;14)   03/17/2022 Cancer Staging   Staging form: Plasma Cell Myeloma and Plasma Cell Disorders, AJCC 8th Edition - Clinical stage from 03/17/2022: RISS Stage III (Beta-2-microglobulin (mg/L): 6.9, Albumin (g/dL): 3.3, ISS: Stage III, High-risk cytogenetics: Present, LDH: Normal) - Signed by [name], MD on 03/17/2022 Stage prefix: Initial diagnosis Beta 2 microglobulin range (mg/L): Greater than or equal to 5.5 Albumin range (g/dL): Less than 3.5 Cytogenetics: t(4;14) translocation, Other mutation, 1q addition   03/24/2022 - 07/19/2022 Chemotherapy   Patient is on Treatment Plan : MYELOMA NEWLY DIAGNOSED Daratumumab + Dexamethasone Weekly (DaraRd) q28d     09/21/2022 Imaging   MRI lumbar spine wo contrast 1. 6 mm  nodule in the cauda equina at the L4-5 level. This likely represents a nerve  sheath tumor such as a schwannoma or neurofibroma. Recommend MRI of the lumbar spine with contrast for further evaluation. 2. 12 mm T2 hyperintense lesion along the inferior aspect of the S1 vertebral body. This may represent focal involvement of multiple myeloma. No other discrete lesions are present. No pathologic fracture is present. 3. Mild bilateral foraminal stenosis at L3-4 and L4-5. 4. Left paramedian annular tear at L5-S1 near the left S1 nerve roots.   12/13/2022 -  Chemotherapy   Patient is on Treatment Plan : PROSTATE Docetaxel (75) q21d     01/10/2023 - 01/10/2023 Chemotherapy   Patient is on Treatment Plan : PROSTATE Docetaxel (75) + Prednisone q21d     Urothelial carcinoma of distal ureter ([place])  03/02/2022 Imaging   PET showed No definitive signs of multiple myeloma by FDG PET.   Obstruction of the RIGHT ureter with soft tissue mass in the RIGHT pelvis showing increased metabolic activity suspicious first and foremost for urothelial neoplasm given location adjacent to RIGHT UVJ and affect upon the RIGHT ureter.   Little or no excretion of FDG on the RIGHT but still with uptake of FDG by renal parenchyma. Degree of hydronephrosis is not substantially changed but lack of FDG excretion on the RIGHT is compatible with secondary sign of physiologic significance of ureteral obstruction.   RIGHT pelvic sidewall uptake without visible lesion, could represent tiny lymph node not visible on noncontrast imaging.   Aortic atherosclerosis.  Pulmonary emphysema. Aortic Atherosclerosis and Emphysema    03/17/2022 Initial Diagnosis   Urothelial carcinoma of distal ureter ([place])  -03/14/2022, cystoscopy showed diffuse narrowing right distal ureter with hydroureter following up the distal ureter and extending proximally.  Right ureteroscopy showed nodular tumor distal ureter and a convincing area without tumor suspicious  for extrinsic obstruction.s/p   right ureteral stent placement. Ureteral tumor showed a tiny fragments of fibrous tissue, interpretation limited by thermal and crush artifact.  Right distal ureter saline barbotage showed hight grade urothelial carcinoma.    03/17/2022 Cancer Staging   Staging form: Renal Pelvis and Ureter, AJCC 8th Edition - Clinical: Stage Unknown (cTX, cN0, cM0) - Signed by [name], MD on 03/17/2022 Stage prefix: Initial diagnosis WHO/ISUP grade (low/high): High Grade Histologic grading system: 2 grade system   06/05/2022 Imaging   PET scan at [facility] showed 1.Interval placement of right-sided double-J ureteral stent with resolution of hydronephrosis and improved excretion of radiotracer.  2.There is persistent abnormal hypermetabolic tissue extending posterior lateral from the right aspect of the bladder encasing the distal ureter/stent which measures approximately 5.1 x 4.2 (CT image 243).  3.There are similar linear areas of abnormal hypermetabolic tissue seen to extend more superiorly along the right posterior pelvic wall/peritoneum, and towards the sciatic notch (fused image 238). Linear configuration suggests possible perineural spread. Note that this soft tissue abuts and may encase pelvic vasculature including the external and internal iliac arteries.  4.NEW single subcentimeter but abnormal focus of FDG avidity in the right presacral region (CT image 231) measuring 1.6 cm representing new site of neoplastic disease   06/19/2022 - 08/10/2022 Radiation Therapy   Pelvis RT 06/19/22-08/21/22 Bladder RT 07/27/22- 08/20/22    09/18/2022 Imaging   PET scan at [place] showed 1. Evidence of disease progression with multifocal new hypermetabolic lesions around the periphery of the liver as described above. The evaluation of these lesions is limited by significant misregistration between the PET and CT portions of the examination. Contrast-enhanced CT or MRI could be used for confirmation and/or  guidance of biopsy as necessary. There is also a lung parenchymal nodule with new associated FDG uptake, also suspicious for a site of metastasis.   2. Although the right nephroureteral stent appears to be well positioned, the right kidney demonstrates slightly increased hydronephrosis with significant surrounding fat stranding and relatively delayed clearance of FDG. These findings could suggest stent malfunction or be related to upper urinary tract infection. Suggest clinical correlation with any patient signs or symptoms such as right flank pain, fever, and/or urinalysis findings.     09/21/2022 Imaging   MRI lumbar spine wo contrast 1. 6 mm nodule in the cauda equina at the L4-5 level. 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 Recommend MRI of the lumbar spine with contrast for further evaluation. 2. 12 mm T2 hyperintense lesion along the inferior aspect of the S1 vertebral body. This may represent focal involvement of multiple myeloma. No other discrete lesions are present. No pathologic fracture is present. 3. Mild bilateral foraminal stenosis at L3-4 and L4-5. 4. Left paramedian annular tear at L5-S1 near the left S1 nerve roots.    Genetic Testing   Negative genetic testing. No pathogenic variants identified on the [facility] Multi-Cancer+RNA panel. The report date is 11/19/2022.  The Multi-Cancer + RNA Panel offered by [facility] includes sequencing and/or deletion/duplication analysis of the following 70 genes:  AIP*, ALK, APC*, ATM*, AXIN2*, BAP1*, BARD1*, BLM*, BMPR1A*, BRCA1*, BRCA2*, BRIP1*, CDC73*, CDH1*, CDK4, CDKN1B*, CDKN2A, CHEK2*, CTNNA1*, DICER1*, EPCAM, EGFR, FH*, FLCN*, GREM1, HOXB13, KIT, LZTR1, MAX*, MBD4, MEN1*, MET, MITF, MLH1*, MSH2*, MSH3*, MSH6*, MUTYH*, NF1*, NF2*, NTHL1*, PALB2*, PDGFRA, PMS2*, POLD1*, POLE*, POT1*, PRKAR1A*, PTCH1*, PTEN*, RAD51C*, RAD51D*, RB1*, RET, SDHA*, SDHAF2*, SDHB*, SDHC*, SDHD*, SMAD4*, SMARCA4*, SMARCB1*,  SMARCE1*, STK11*, SUFU*, TMEM127*, TP53*, TSC1*, TSC2*, VHL*. RNA analysis is performed for * genes.   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 Evidence of local prostate carcinoma within the pelvis with peritoneal implants along the bowel serosal surface as well as a metastatic LEFT iliac lymph node. 2. 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 3. Multifocal pleural metastasis in the RIGHT hemithorax. 4. No clear evidence of skeletal metastasis      Genetic Testing   Negative genetic testing. No pathogenic variants identified on the [facility] Multi-Cancer+RNA panel. The report date is 11/19/2022.  The Multi-Cancer + RNA Panel offered by [facility] includes sequencing and/or deletion/duplication analysis of the following 70 genes:  AIP*, ALK, APC*, ATM*, AXIN2*, BAP1*, BARD1*, BLM*, BMPR1A*, BRCA1*, BRCA2*, BRIP1*, CDC73*, CDH1*, CDK4, CDKN1B*, CDKN2A, CHEK2*, CTNNA1*, DICER1*, EPCAM, EGFR, FH*, FLCN*, GREM1, HOXB13, KIT, LZTR1, MAX*, MBD4, MEN1*, MET, MITF, MLH1*, MSH2*, MSH3*, MSH6*, MUTYH*, NF1*, NF2*, NTHL1*, PALB2*, PDGFRA, PMS2*, POLD1*, POLE*, POT1*, PRKAR1A*, PTCH1*, PTEN*, RAD51C*, RAD51D*, RB1*, RET, SDHA*, SDHAF2*, SDHB*, SDHC*, SDHD*, SMAD4*, SMARCA4*, SMARCB1*, SMARCE1*, STK11*, SUFU*, TMEM127*, TP53*, TSC1*, TSC2*, VHL*. RNA analysis is performed for * genes.     INTERVAL HISTORY [name] [name] is a 82 y.o. male who has above history reviewed by me today presents for follow up visit for multiple myeloma and high-grade urothelial  carcinoma, metastatic  prostate cancer.  He has developed URI and urinary urgency symptoms,  11/27/22 UA showed RBC >50, moderate leukocytes, Urine culture negative.  Cipro did not provide him any relieve. Was seen by symptomatic management clinic and was prescribed a course steroid and Keflex.  11/28/22 CXR showed bronchitis - URI symptom has improved.  11/29/22 US renal Mild right hydronephrosis and proximal hydroureter. Low level echoes within the visualized proximal right ureter and renal pelvis,which may reflect blood products or debris.2. Stent is partially visualized within the urinary bladder. Right ureteral jet was not definitively seen.  He was seen by [facility] urology and his symptoms were felt to be due to post treatment inflammation. He was recommended to take Flomax and NSAIDS.  Due to his CKD, we recommend him not to take NSAIDS.  Today he is here for chemotherapy treatments.  He continues to have increased urine frequently, burning with urination, and doesn't feel like he is emptying well. Also swelling of penis.  We arranged him to be seen by [facility] urology team today and his symptoms was felt to be due to stent discomfort versus underlying malignancy and constipation may also contribute to his symtoms.  Marland Kitchen He was recommended to start [place]. Miralax for constipation.  Balanoposthitis, he will take one dose of diflucan.  He returned from urologist's office and is here for evaluation prior to his Docetaxel treatment.  Denies fever, chills, flank pain.   Review of Systems  Constitutional:  Positive for fatigue. Negative for appetite change, chills, fever and unexpected weight change.  HENT:   Negative for hearing loss and voice change.   Eyes:  Negative for eye problems and icterus.  Respiratory:  Negative for chest tightness, cough and shortness of breath.   Cardiovascular:  Negative for chest pain and leg swelling.  Gastrointestinal:  Positive for constipation. Negative for abdominal distention, abdominal  pain and diarrhea.  Endocrine: Negative for hot flashes.  Genitourinary:  Positive for dysuria and frequency. Negative for difficulty urinating.        Swelling of penis  Musculoskeletal:  Negative for arthralgias.  Skin:  Negative for itching.       itching  Neurological:  Negative for light-headedness and numbness.  Hematological:  Negative for adenopathy. Does not bruise/bleed easily.  Psychiatric/Behavioral:  Negative for confusion.      MEDICAL HISTORY:  Past Medical History:  Diagnosis Date   Age related osteoporosis    Anemia    Barrett's esophagus    Bradycardia    Cancer (HCC)    PROSTATE   Cataract    CKD (chronic kidney disease) stage 3, GFR 30-59 ml/min (HCC)    Colon polyps    DDD (degenerative disc disease), cervical    DDD (degenerative disc disease), lumbar    Femur fracture (HCC)    Right   Fundic gland polyposis of stomach    Fundic gland polyps of stomach, benign    Gastritis    GERD (gastroesophageal reflux disease)    Hematuria    Hemorrhage of rectum and anus    History of colon polyps    Insomnia    Lumbago    Osteopenia of the elderly    Skin cancer    Sleep apnea     SURGICAL HISTORY: Past Surgical History:  Procedure Laterality Date   BAND HEMORRHOIDECTOMY     COLONOSCOPY     COLONOSCOPY WITH PROPOFOL N/A 05/06/2018   Procedure: COLONOSCOPY WITH PROPOFOL;  Surgeon: Lollie Sails, MD;  Location: [facility] ENDOSCOPY;  Service: Endoscopy;  Laterality: N/A;   COLONOSCOPY WITH PROPOFOL N/A 09/23/2018   Procedure: COLONOSCOPY WITH PROPOFOL;  Surgeon: Lollie Sails, MD;  Location: [facility] ENDOSCOPY;  Service: Endoscopy;  Laterality: N/A;   COLONOSCOPY WITH PROPOFOL N/A 03/30/2021   Procedure: COLONOSCOPY WITH PROPOFOL;  Surgeon: Toledo, Benay Pike, MD;  Location: [facility] ENDOSCOPY;  Service: Gastroenterology;  Laterality: N/A;   CYSTOSCOPY W/ URETERAL STENT PLACEMENT Right 03/14/2022   Procedure: CYSTOSCOPY WITH RETROGRADE PYELOGRAM/URETERAL  STENT PLACEMENT;  Surgeon: Abbie Sons, MD;  Location: [facility] ORS;  Service: Urology;  Laterality: Right;   ESOPHAGOGASTRODUODENOSCOPY (EGD) WITH PROPOFOL N/A 10/03/2016   Procedure: ESOPHAGOGASTRODUODENOSCOPY (EGD) WITH PROPOFOL;  Surgeon: Lollie Sails, MD;  Location[facility] ENDOSCOPY;  Service: Endoscopy;  Laterality: N/A;   EYE SURGERY     CATARACTS   EYELID SURGERY     FLEXIBLE SIGMOIDOSCOPY     FRACTURE SURGERY Right 2016   f[place]ur   FRACTURE SURGERY     ORIF DISTAL FEMUR FRACTURE     TONSILLECTOMY     TRANSURETHRAL RESECTION OF BLADDER TUMOR N/A 03/14/2022   Procedure: TRANSURETHRAL RESECTION OF BLADDER TUMOR (TURBT);  Surgeon: Abbie Sons, MD;  Location: [facility] ORS;  Service: Urology;  Laterality: N/A;    SOCIAL HISTORY: Social History   Socioeconomic History   Marital status: Married    Spouse name: Not on file   Number of children: Not on file   Years of education: Not on file   Highest education level: Not on file  Occupational History   Not on file  Tobacco Use   Smoking status: Former    Packs/day: 1.00    Years: 15.00    Total pack years: 15.00    Types: Cigarettes    Quit date: 10/03/1974    Years since quitting: 48.2   Smokeless tobacco: Never  Vaping Use   Vaping Use: Never used  Substance and Sexual Activity   Alcohol use: Not Currently    Alcohol/week: 1.0 standard drink of alcohol    Types: 1 Cans of beer per week    Comment: 1 beer every 2 weeks   Drug use: No   Sexual activity: Yes    Birth control/protection: None  Other Topics Concern   Not on file  Social History Narrative   ** Merged History Encounter **       Social Determinants of Health   Financial Resource Strain: Not on file  Food Insecurity: Not on file  Transportation Needs: Not on file  Physical Activity: Not on file  Stress: Not on file  Social Connections: Not on file  Intimate Partner Violence: Not on file    FAMILY HISTORY: Family History  Problem Relation Age  of Onset   Breast cancer Mother        dx 12s-50s   Prostate cancer Paternal Grandfather 48   Breast cancer Cousin     ALLERGIES:  is allergic to ciprofloxacin, demerol [meperidine hcl], gabapentin, prolia [denosumab], demerol [meperidine], and nsaids.  MEDICATIONS:  Current Outpatient Medications  Medication Sig Dispense Refill   acyclovir (ZOVIRAX) 200 MG capsule Take 1 capsule (200 mg total) by mouth 2 (two) times daily. 60 capsule 3   aspirin EC 81 MG tablet Take 1 tablet (81 mg total) by mouth daily. Swallow whole. (Patient not taking: Reported on 12/07/2022) 30 tablet 11   Calcium Carbonate (CALCIUM 500 PO) Take 1 tablet by mouth daily.     Cholecalciferol (VITAMIN D3) 125 MCG (  5000 UT) CAPS Take 5,000 Units by mouth daily.     dexamethasone (DECADRON) 4 MG tablet Take 4 mg by mouth 2 (two) times daily with a meal. Sig: Take 2 tabs by mouth 2 times daily starting day before chemo. Then take 2 tabs daily for 2 days starting day after chemo. Take with food.     ferrous sulfate 325 (65 FE) MG tablet Take 325 mg by mouth daily.     fluconazole (DIFLUCAN) 150 MG tablet Take 1 tablet (150 mg total) by mouth once for 1 dose. 1 tablet 0   ibuprofen (ADVIL) 200 MG tablet Take 200 mg by mouth every 6 (six) hours as needed. (Patient not taking: Reported on 12/13/2022)     ipratropium (ATROVENT) 0.03 % nasal spray Place 1 spray into both nostrils in the morning.     losartan (COZAAR) 25 MG tablet Take 25 mg by mouth daily.     melatonin 5 MG TABS Take 5 mg by mouth at bedtime.     Multiple Vitamins-Minerals (MENS 50+ MULTIVITAMIN) TABS Take 1 tablet by mouth daily.     nystatin cream (MYCOSTATIN) Apply 1 Application topically 2 (two) times daily. 30 g 0   ondansetron (ZOFRAN) 8 MG tablet Take 1 tablet (8 mg total) by mouth every 8 (eight) hours as needed for nausea or vomiting. (Patient not taking: Reported on 12/12/2022) 30 tablet 1   prochlorperazine (COMPAZINE) 10 MG tablet Take 1 tablet (10 mg  total) by mouth every 6 (six) hours as needed for nausea or vomiting. 30 tablet 1   senna (SENOKOT) 8.6 MG TABS tablet Take 2 tablets (17.2 mg total) by mouth daily. 120 tablet 0   tamsulosin (FLOMAX) 0.4 MG CAPS capsule Take 1 capsule by mouth daily.     traZODone (DESYREL) 50 MG tablet Take 25 mg by mouth at bedtime.     Vibegron (GEMTESA) 75 MG TABS Take 75 mg by mouth daily. 28 tablet 0   No current facility-administered medications for this visit.     PHYSICAL EXAMINATION: ECOG PERFORMANCE STATUS: 1 - Symptomatic but completely ambulatory Today's Vitals   12/13/22 1100  BP: (!) 146/53  Pulse: 67  Resp: 20  Temp: (!) 97 F (36.1 C)   There is no height or weight on file to calculate BMI.   Physical Exam Constitutional:      General: He is not in acute distress. HENT:     Head: Normocephalic and atraumatic.  Eyes:     General: No scleral icterus. Cardiovascular:     Rate and Rhythm: Normal rate.  Pulmonary:     Effort: Pulmonary effort is normal. No respiratory distress.     Breath sounds: No wheezing.  Abdominal:     General: There is no distension.     Palpations: Abdomen is soft.  Musculoskeletal:        General: No deformity. Normal range of motion.     Cervical back: Normal range of motion and neck supple.  Skin:    General: Skin is warm and dry.     Findings: No erythema.  Neurological:     Mental Status: He is alert and oriented to person, place, and time. Mental status is at baseline.     Cranial Nerves: No cranial nerve deficit.  Psychiatric:        Mood and Affect: Mood normal.      LABORATORY DATA:  I have reviewed the data as listed    Latest Ref Rng &  Units 12/13/2022    8:34 AM 12/07/2022    8:51 AM 11/28/2022   12:22 PM  CBC  WBC 4.0 - 10.5 K/uL 14.1  7.1  5.4   Hemoglobin 13.0 - 17.0 g/dL 11.1  11.9  11.5   Hematocrit 39.0 - 52.0 % 33.1  35.5  34.6   Platelets 150 - 400 K/uL 202  241  198        Latest Ref Rng & Units 12/13/2022     8:34 AM 12/07/2022    8:51 AM 11/28/2022   12:22 PM  CMP  Glucose 70 - 99 mg/dL 133  80  124   BUN 8 - 23 mg/dL 47  42  40   Creatinine 0.61 - 1.24 mg/dL 2.15  2.11  2.41   Sodium 135 - 145 mmol/L 140  139  137   Potassium 3.5 - 5.1 mmol/L 4.3  4.3  4.2   Chloride 98 - 111 mmol/L 109  104  105   CO2 22 - 32 mmol/L '23  27  26   '$ Calcium 8.9 - 10.3 mg/dL 8.9  8.6  8.6   Total Protein 6.5 - 8.1 g/dL 5.5  6.0  6.1   Total Bilirubin 0.3 - 1.2 mg/dL 0.5  0.6  0.5   Alkaline Phos 38 - 126 U/L 74  79  88   AST 15 - 41 U/L '24  22  24   '$ ALT 0 - 44 U/L '19  23  21     '$ Iron/TIBC/Ferritin/ %Sat    Component Value Date/Time   IRON 60 02/23/2022 1523   TIBC 238 (L) 02/23/2022 1523   FERRITIN 86 02/23/2022 1523   IRONPCTSAT 25 02/23/2022 1523       RADIOGRAPHIC STUDIES: I have personally reviewed the radiological images as listed and agreed with the findings in the report.  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 Additional history of bladder cancer. EXAM: NUCLEAR MEDICINE PET SKULL BASE TO THIGH TECHNIQUE: 9.7 mCi F18 Piflufolastat (Pylarify) was injected intravenously. Full-ring PET imaging was performed from the skull base to thigh after the radiotracer. CT data was obtained and used for attenuation correction and anatomic localization. COMPARISON:  FDG PET scan 05/02/2022 FINDINGS: NECK No radiotracer activity in neck lymph nodes. Incidental CT finding: None. CHEST Intense radiotracer activity at the junction of the first rib in the manubrium along the pleural surface with SUV max equal 15.1 (image 81). Intense activity within the RIGHT lower lobe along the pleural surface with SUV max equal 27.8 (image 132). There is mild pleural thickening at this level. Additional lesion along the pleural surface of the RIGHT lower lobe on image 110 with SUV max equal 27. Small lymph node position  between the esophagus and descending thoracic aorta adjacent to the spine with SUV max equal 11.5 on image 116. Potential small lymph node evident on the CT portion exam measuring 2-3 mm. Incidental CT finding: No discrete pulmonary nodules. ABDOMEN/PELVIS Prostate: No focal activity in prostatectomy bed. Intense metabolic activity within the deep pelvis adjacent to the rectum with SUV max equal 30.4 (image 223). No clear CT correlation on noncontrast exam. Potential serosal metastatic implant. Additional potential small bowel serosal implant on image 220 in the deep LEFT pelvis. Lymph nodes: Radiotracer avid LEFT external iliac lymph node measuring 12 mm image 211 SUV max equal 25.8. Liver: Multifocal round lesion bilobed hepatic  lesions with intense radiotracer activity. Example lesion in the superior LEFT hepatic lobe measuring 35 mm with SUV max equal 29.6 (image 130).\ Example lesion in the RIGHT hepatic lobe measuring 38 mm SUV max equal 36.4. Approximately 15 lesions in the liver. Incidental CT finding: Double-J ureteral stent on the RIGHT without obstruction. SKELETON No clear evidence skeletal metastasis. Lesions in the RIGHT pleural space adjacent ribs are favored pleural lesions rather than skeletal lesions. IMPRESSION: 1. Evidence of local prostate carcinoma within the pelvis with peritoneal implants along the bowel serosal surface as well as a metastatic LEFT iliac lymph node. 2. 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 3. Multifocal pleural metastasis in the RIGHT hemithorax. 4. No clear evidence of skeletal metastasis Electronically Signed   By: Suzy Bouchard M.D.   On: 12/05/2022 16:39   US RENAL  Result Date: 11/29/2022 CLINICAL DATA:  Dysuria, hematuria, urothelial carcinoma EXAM: RENAL / URINARY TRACT ULTRASOUND COMPLETE COMPARISON:  Ultrasound 02/02/2022, PET-CT 03/02/2022 FINDINGS: Right Kidney: Renal measurements: 9.5 x 5.0 x 6.9 cm = volume: 173  mL. Echogenicity within normal limits. No mass or shadowing stone visualized. Mild hydronephrosis. The proximal right ureter is dilated. Low level echoes within the visualized proximal ureter and right renal pelvis. Left Kidney: Renal measurements: 10.2 x 7.1 x 5.5 cm = volume: 204 mL. Echogenicity within normal limits. Benign cysts within the lower pole of the left kidney which do not require follow-up imaging. No shadowing stone or hydronephrosis visualized. Bladder: Appears normal for degree of bladder distention. Stent is partially visualized within the urinary bladder. Right ureteral jet was not definitively seen. Other: None. IMPRESSION: 1. Mild right hydronephrosis and proximal hydroureter. Low level echoes within the visualized proximal right ureter and renal pelvis, which may reflect blood products or debris. 2. Stent is partially visualized within the urinary bladder. Right ureteral jet was not definitively seen. Electronically Signed   By: Davina Poke D.O.   On: 11/29/2022 14:54   DG Abd 1 View  Result Date: 11/29/2022 CLINICAL DATA:  Flank pain, dysuria EXAM: ABDOMEN - 1 VIEW COMPARISON:  PET-CT done on 03/02/2022 FINDINGS: Bowel gas pattern is nonspecific. Right ureteral stent is seen in place. Phleboliths are seen in pelvis. There is previous internal fixation in the right femur. IMPRESSION: Right ureteral stent is noted in place. Nonspecific bowel gas pattern. Electronically Signed   By: Elmer Picker M.D.   On: 11/29/2022 14:49   DG Chest 2 View  Result Date: 11/28/2022 CLINICAL DATA:  Cough.  Metastatic prostate cancer. EXAM: CHEST - 2 VIEW COMPARISON:  06/21/2022 FINDINGS: Heart size is normal. Mild aortic tortuosity. Bronchial thickening suggesting bronchitis. No consolidation, collapse or effusion. No acute bone finding. IMPRESSION: Bronchitis pattern. No consolidation or collapse. Electronically Signed   By: Nelson Chimes M.D.   On: 11/28/2022 12:19   US BIOPSY  (LIVER)  Result Date: 10/06/2022 INDICATION: History of multiple myeloma, new liver lesions by PET-CT EXAM: ULTRASOUND CORE BIOPSY RIGHT INFERIOR LIVER LESION MEDICATIONS: 1% LIDOCAINE LOCAL ANESTHESIA/SEDATION: Moderate (conscious) sedation was employed during this procedure. A total of Versed 1.0 mg and Fentanyl 50 mcg was administered intravenously by the radiology nurse. Total intra-service moderate Sedation Time: 10 minutes. The patient's level of consciousness and vital signs were monitored continuously by radiology nursing throughout the procedure under my direct supervision. COMPLICATIONS: None immediate. PROCEDURE: Informed written consent was obtained from the patient after a thorough discussion of the procedural risks, benefits and alternatives. All questions were addressed. Maximal  Sterile Barrier Technique was utilized including caps, mask, sterile gowns, sterile gloves, sterile drape, hand hygiene and skin antiseptic. A timeout was performed prior to the initiation of the procedure. previous imaging reviewed. preliminary ultrasound performed. a right inferior hepatic lesion was localized and marked for biopsy. under sterile conditions and local anesthesia, the 17 gauge 6.8 cm access was advanced from a lower intercostal approach to the lesion. needle position confirmed with ultrasound. images obtained for documentation. 2 18 gauge cores obtained. samples placed in formalin. needle tract occluded with gel-foam. postprocedure imaging demonstrates no hemorrhage or hematoma. patient tolerated biopsy well. IMPRESSION: Successful CT-guided core biopsy of a right inferior hepatic lesion. Electronically Signed   By: Jerilynn Mages.  Shick M.D.   On: 10/06/2022 10:42   US Venous Img Upper Uni Right  Result Date: 09/22/2022 CLINICAL DATA:  82 year old male with a history of facial swelling EXAM: RIGHT UPPER EXTREMITY VENOUS DOPPLER ULTRASOUND TECHNIQUE: Gray-scale sonography with graded compression, as well as color  Doppler and duplex ultrasound were performed to evaluate the upper extremity deep venous system from the level of the subclavian vein and including the jugular, axillary, basilic, radial, ulnar and upper cephalic vein. Spectral Doppler was utilized to evaluate flow at rest and with distal augmentation maneuvers. COMPARISON:  None Available. FINDINGS: Contralateral Subclavian Vein: Respiratory phasicity is normal and symmetric with the symptomatic side. No evidence of thrombus. Normal compressibility. Internal Jugular Vein: No evidence of thrombus. Normal compressibility, respiratory phasicity and response to augmentation. Subclavian Vein: No evidence of thrombus. Normal compressibility, respiratory phasicity and response to augmentation. Axillary Vein: No evidence of thrombus. Normal compressibility, respiratory phasicity and response to augmentation. Cephalic Vein: No evidence of thrombus. Normal compressibility, respiratory phasicity and response to augmentation. Basilic Vein: No evidence of thrombus. Normal compressibility, respiratory phasicity and response to augmentation. Brachial Veins: No evidence of thrombus. Normal compressibility, respiratory phasicity and response to augmentation. Radial Veins: No evidence of thrombus. Normal compressibility, respiratory phasicity and response to augmentation. Ulnar Veins: No evidence of thrombus. Normal compressibility, respiratory phasicity and response to augmentation. Other Findings:  None visualized. IMPRESSION: Directed duplex of the right upper extremity negative for DVT Signed, Dulcy Fanny. Nadene Rubins, RPVI [facility] Electronically Signed   By: Corrie Mckusick D.O.   On: 09/22/2022 16:35   MR Lumbar Spine Wo Contrast  Result Date: 09/21/2022 CLINICAL DATA:  Low back pain. Multiple myeloma. Increased fracture risk. Pain extends into the left lower extremity. EXAM: MRI LUMBAR SPINE WITHOUT CONTRAST  TECHNIQUE: Multiplanar, multisequence MR imaging of the lumbar spine was performed. No intravenous contrast was administered. COMPARISON:  CT of the abdomen and pelvis 02/07/2019. Whole-body bone scan 02/07/2019. FINDINGS: Segmentation: 5 non rib-bearing lumbar type vertebral bodies are present. The lowest fully formed vertebral body is L5. Alignment: No significant listhesis is present. Lumbar lordosis is preserved. Vertebrae: A T2 hyperintense lesion along the inferior aspect of the S1 vertebral body measures up to 12 mm. No other discrete marrow lesions are present. Vertebral body heights are normal. Conus medullaris and cauda equina: Conus extends to the T12-L1 level. Conus is within normal limits. A 6 mm nodule is present in the cauda equina at the L4-5 level. Nerve roots are otherwise within normal limits. Paraspinal and other soft tissues: Benign cysts of the liver are again noted. A simple cyst in the posterior aspect of the left kidney is stable measuring 27 mm. No other discrete solid organ lesions are present. No significant adenopathy is  present. Disc levels: T12-L1: Insert normal disc level L1-2: Insert normal disc level L2-3: Mild disc desiccation is present. No significant herniation or stenosis is present. L3-4: A mild broad-based disc bulge is present. Disc extends into the foramina bilaterally. Mild bilateral foraminal stenosis is present. Central canal is patent. L4-5: Mild lateral disc bulging is present. Facet hypertrophy and short pedicles contribute to mild bilateral foraminal stenosis. L5-S1: A broad-based disc protrusion is present. Left paramedian annular tear is present near the left S1 nerve roots. Disc material extends into the foramina bilaterally without significant stenosis. IMPRESSION: 1. 6 mm nodule in the cauda equina at the L4-5 level. 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 Recommend MRI of the lumbar spine with contrast for further  evaluation. 2. 12 mm T2 hyperintense lesion along the inferior aspect of the S1 vertebral body. This may represent focal involvement of multiple myeloma. No other discrete lesions are present. No pathologic fracture is present. 3. Mild bilateral foraminal stenosis at L3-4 and L4-5. 4. Left paramedian annular tear at L5-S1 near the left S1 nerve roots. Electronically Signed   By: San Morelle M.D.   On: 09/21/2022 10:11

## 2022-12-13 NOTE — Assessment & Plan Note (Signed)
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  I will hold off Daratumumab and let him start on Docetaxel treatments.  Expect that his myeloma will progress in the future. Currently, will prioritize his treatment for prostate cancer  Continue Acyclovir '200mg'$  BID  -Bone health, currently he does not have an active myeloma bone involvement.  Patient reports history of developing adverse reaction [vision loss] after Prolia treatments. -s/p  root canal.  Awaiting  ophthalmologist to clearance of startling Xgeva. Marland Kitchen

## 2022-12-13 NOTE — Assessment & Plan Note (Signed)
No signs of acute infection Likely due to bladder inflammation, and Balanoposthitis- treated with Diflucan.  

## 2022-12-13 NOTE — Assessment & Plan Note (Signed)
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 S/p palliative radiation. Repeat cystoscopy did not reveal any recurrence. - He has ureter stent, Recommend patient continue follow up with Urology.  Possible need of starting [place] and Padcev in the near future if recurrence develops.

## 2022-12-13 NOTE — Assessment & Plan Note (Signed)
Stable hemoglobin. monitor 

## 2022-12-13 NOTE — Patient Instructions (Signed)
[facility] CANCER CTR AT [place]  Discharge Instructions: Thank you for choosing [facility] to provide your oncology and hematology care.  If you have a lab appointment with the [facility], please go directly to the [facility] and check in at the registration area.  Wear comfortable clothing and clothing appropriate for easy access to any Portacath or PICC line.   We strive to give you quality time with your provider. You may need to reschedule your appointment if you arrive late (15 or more minutes).  Arriving late affects you and other patients whose appointments are after yours.  Also, if you miss three or more appointments without notifying the office, you may be dismissed from the clinic at the provider's discretion.      For prescription refill requests, have your pharmacy contact our office and allow 72 hours for refills to be completed.    Today you received the following chemotherapy and/or immunotherapy agents docetaxel    To help prevent nausea and vomiting after your treatment, we encourage you to take your nausea medication as directed.  BELOW ARE SYMPTOMS THAT SHOULD BE REPORTED IMMEDIATELY: *FEVER GREATER THAN 100.4 F (38 C) OR HIGHER *CHILLS OR SWEATING *NAUSEA AND VOMITING THAT IS NOT CONTROLLED WITH YOUR NAUSEA MEDICATION *UNUSUAL SHORTNESS OF BREATH *UNUSUAL BRUISING OR BLEEDING *URINARY PROBLEMS (pain or burning when urinating, or frequent urination) *BOWEL PROBLEMS (unusual diarrhea, constipation, pain near the anus) TENDERNESS IN MOUTH AND THROAT WITH OR WITHOUT PRESENCE OF ULCERS (sore throat, sores in mouth, or a toothache) UNUSUAL RASH, SWELLING OR PAIN  UNUSUAL VAGINAL DISCHARGE OR ITCHING   Items with * indicate a potential emergency and should be followed up as soon as possible or go to the Emergency Department if any problems should occur.  Please show the CHEMOTHERAPY ALERT CARD or IMMUNOTHERAPY ALERT CARD at check-in to the  Emergency Department and triage nurse.  Should you have questions after your visit or need to cancel or reschedule your appointment, please contact [facility] CANCER [place] AT [place]  [phone] and follow the prompts.  Office hours are 8:00 a.m. to 4:30 p.m. Monday - Friday. Please note that voicemails left after 4:00 p.m. may not be returned until the following business day.  We are closed weekends and major holidays. You have access to a nurse at all times for urgent questions. Please call the main number to the clinic [phone] and follow the prompts.  For any non-urgent questions, you may also contact your provider using MyChart. We now offer e-Visits for anyone 57 and older to request care online for non-urgent symptoms. For details visit [email or web address].   Also download the MyChart app! Go to the app store, search "MyChart", open the app, select [place], and log in with your MyChart username and password.  Masks are optional in the cancer centers. If you would like for your care team to wear a mask while they are taking care of you, please let them know. For doctor visits, patients may have with them one support person who is at least 84 years old. At this time, visitors are not allowed in the infusion area.  Docetaxel Injection What is this medication? DOCETAXEL (doe se TAX el) treats some types of cancer. It works by slowing down the growth of cancer cells. This medicine may be used for other purposes; ask your health care provider or pharmacist if you have questions. COMMON BRAND NAME(S): Docefrez, Taxotere What should I tell my care team before  I take this medication? They need to know if you have any of these conditions: Kidney disease Liver disease Low white blood cell levels Tingling of the fingers or toes or other nerve disorder An unusual or allergic reaction to docetaxel, polysorbate 80, other medications, foods, dyes, or preservatives Pregnant or  trying to get pregnant Breast-feeding How should I use this medication? This medication is injected into a vein. It is given by your care team in a hospital or clinic setting. Talk to your care team about the use of this medication in children. Special care may be needed. Overdosage: If you think you have taken too much of this medicine contact a poison control center or emergency room at once. NOTE: This medicine is only for you. Do not share this medicine with others. What if I miss a dose? Keep appointments for follow-up doses. It is important not to miss your dose. Call your care team if you are unable to keep an appointment. What may interact with this medication? Do not take this medication with any of the following: Live virus vaccines This medication may also interact with the following: Certain antibiotics, such as clarithromycin, telithromycin Certain antivirals for HIV or hepatitis Certain medications for fungal infections, such as itraconazole, ketoconazole, voriconazole Grapefruit juice Nefazodone Supplements, such as St. John's wort This list may not describe all possible interactions. Give your health care provider a list of all the medicines, herbs, non-prescription drugs, or dietary supplements you use. Also tell them if you smoke, drink alcohol, or use illegal drugs. Some items may interact with your medicine. What should I watch for while using this medication? This medication may make you feel generally unwell. This is not uncommon as chemotherapy can affect healthy cells as well as cancer cells. Report any side effects. Continue your course of treatment even though you feel ill unless your care team tells you to stop. You may need blood work done while you are taking this medication. This medication can cause serious side effects and infusion reactions. To reduce the risk, your care team may give you other medications to take before receiving this one. Be sure to follow  the directions from your care team. This medication may increase your risk of getting an infection. Call your care team for advice if you get a fever, chills, sore throat, or other symptoms of a cold or flu. Do not treat yourself. Try to avoid being around people who are sick. Avoid taking medications that contain aspirin, acetaminophen, ibuprofen, naproxen, or ketoprofen unless instructed by your care team. These medications may hide a fever. Be careful brushing or flossing your teeth or using a toothpick because you may get an infection or bleed more easily. If you have any dental work done, tell your dentist you are receiving this medication. Some products may contain alcohol. Ask your care team if this medication contains alcohol. Be sure to tell all care teams you are taking this medicine. Certain medications, like metronidazole and disulfiram, can cause an unpleasant reaction when taken with alcohol. The reaction includes flushing, headache, nausea, vomiting, sweating, and increased thirst. The reaction can last from 30 minutes to several hours. This medication may affect your coordination, reaction time, or judgement. Do not drive or operate machinery until you know how this medication affects you. Sit up or stand slowly to reduce the risk of dizzy or fainting spells. Drinking alcohol with this medication can increase the risk of these side effects. Talk to your care team about  your risk of cancer. You may be more at risk for certain types of cancer if you take this medication. Talk to your care team if you wish to become pregnant or think you might be pregnant. This medication can cause serious birth defects if taken during pregnancy or if you get pregnant within 2 months after stopping therapy. A negative pregnancy test is required before starting this medication. A reliable form of contraception is recommended while taking this medication and for 2 months after stopping it. Talk to your care team  about reliable forms of contraception. Do not breast-feed while taking this medication and for 1 week after stopping therapy. Use a condom during sex and for 4 months after stopping therapy. Tell your care team right away if you think your partner might be pregnant. This medication can cause serious birth defects. This medication may cause infertility. Talk to your care team if you are concerned about your fertility. What side effects may I notice from receiving this medication? Side effects that you should report to your care team as soon as possible: Allergic reactions--skin rash, itching, hives, swelling of the face, lips, tongue, or throat Change in vision such as blurry vision, seeing halos around lights, vision loss Infection--fever, chills, cough, or sore throat Infusion reactions--chest pain, shortness of breath or trouble breathing, feeling faint or lightheaded Low red blood cell level--unusual weakness or fatigue, dizziness, headache, trouble breathing Pain, tingling, or numbness in the hands or feet Painful swelling, warmth, or redness of the skin, blisters or sores at the infusion site Redness, blistering, peeling, or loosening of the skin, including inside the mouth Sudden or severe stomach pain, bloody diarrhea, fever, nausea, vomiting Swelling of the ankles, hands, or feet Tumor lysis syndrome (TLS)--nausea, vomiting, diarrhea, decrease in the amount of urine, dark urine, unusual weakness or fatigue, confusion, muscle pain or cramps, fast or irregular heartbeat, joint pain Unusual bruising or bleeding Side effects that usually do not require medical attention (report to your care team if they continue or are bothersome): Change in nail shape, thickness, or color Change in taste Hair loss Increased tears This list may not describe all possible side effects. Call your doctor for medical advice about side effects. You may report side effects to FDA at 1-800-FDA-1088. Where should  I keep my medication? This medication is given in a hospital or clinic. It will not be stored at home. NOTE: This sheet is a summary. It may not cover all possible information. If you have questions about this medicine, talk to your doctor, pharmacist, or health care provider.  2023 Elsevier/Gold Standard (2008-01-11 00:00:00)

## 2022-12-13 NOTE — Progress Notes (Signed)
12/13/2022 4:44 PM   [name] [name] [name] [name], [name] [name] [id number]  CC: Chief Complaint  Patient presents with   Urinary Frequency    HPI: [name] [name] is a 82 y.o. 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   Today he reports an approximate 1 month history of dysuria, urgency, and frequency.  More recently, he has developed penile redness and swelling of the glans penis that he believes represents a yeast infection.  He was treated for possible UTI 1 month ago with empiric Keflex, however his urine culture finalized with no growth.  His right ureteral stent was most recently exchanged at [facility] on 10/31/2022.  He reports his symptoms started around 11/20/2022.  He was started on Flomax 0.4 mg by [facility] earlier this week and is tolerating it without orthostasis.  He saw [facility] yesterday, who started him on oxybutynin XL 10 mg daily and nystatin cream for balanoposthitis.  Today he reports that his penile erythema has improved since starting nystatin cream.  He is worried that his ureteral stent may be too long and causing his new urinary symptoms.  He also reports some intermittent constipation, which he has been managing with stool softeners and stimulant laxatives.  PVR 0 mL.  PMH: Past Medical History:  Diagnosis Date   Age related osteoporosis    Anemia    Barrett's esophagus    Bradycardia    Cancer (HCC)    PROSTATE   Cataract    CKD (chronic kidney disease) stage 3, GFR 30-59 ml/min (HCC)    Colon polyps    DDD (degenerative disc disease), cervical    DDD (degenerative disc disease), lumbar    Femur fracture (HCC)    Right   Fundic gland polyposis of stomach    Fundic gland polyps of stomach, benign    Gastritis    GERD (gastroesophageal reflux  disease)    Hematuria    Hemorrhage of rectum and anus    History of colon polyps    Insomnia    Lumbago    Osteopenia of the elderly    Skin cancer    Sleep apnea     Surgical History: Past Surgical History:  Procedure Laterality Date   BAND HEMORRHOIDECTOMY     COLONOSCOPY     COLONOSCOPY WITH PROPOFOL N/A [name]/02/2018   Procedure: COLONOSCOPY WITH PROPOFOL;  Surgeon: Lollie Sails, MD;  Location: [facility] ENDOSCOPY;  Service: Endoscopy;  Laterality: N/A;   COLONOSCOPY WITH PROPOFOL N/A 09/23/2018   Procedure: COLONOSCOPY WITH PROPOFOL;  Surgeon: Lollie Sails, MD;  Location: [facility] ENDOSCOPY;  Service: Endoscopy;  Laterality: N/A;   COLONOSCOPY WITH PROPOFOL N/A 03/30/2021   Procedure: COLONOSCOPY WITH PROPOFOL;  Surgeon: Toledo, Benay Pike, MD;  Location: [facility] ENDOSCOPY;  Service: Gastroenterology;  Laterality: N/A;   CYSTOSCOPY W/ URETERAL STENT PLACEMENT Right 03/14/2022   Procedure: CYSTOSCOPY WITH RETROGRADE PYELOGRAM/URETERAL STENT PLACEMENT;  Surgeon: Abbie Sons, MD;  Location: [facility] ORS;  Service: Urology;  Laterality: Right;   ESOPHAGOGASTRODUODENOSCOPY (EGD) WITH PROPOFOL N/A 10/03/2016   Procedure: ESOPHAGOGASTRODUODENOSCOPY (EGD) WITH PROPOFOL;  Surgeon: Lollie Sails, MD;  Location[facility] ENDOSCOPY;  Service: Endoscopy;  Laterality: N/A;   EYE SURGERY     CATARACTS   EYELID SURGERY     FLEXIBLE SIGMOIDOSCOPY     FRACTURE SURGERY  Right 2016   femur   FRACTURE SURGERY     ORIF DISTAL FEMUR FRACTURE     TONSILLECTOMY     TRANSURETHRAL RESECTION OF BLADDER TUMOR N/A 03/14/2022   Procedure: TRANSURETHRAL RESECTION OF BLADDER TUMOR (TURBT);  Surgeon: Abbie Sons, MD;  Location: [facility] ORS;  Service: Urology;  Laterality: N/A;    Home Medications:  Allergies as of 12/13/2022       Reactions   Ciprofloxacin Other (See Comments)   Yeast infection   Demerol [meperidine Hcl] Other (See Comments)   "I pass out"   Gabapentin Hives   Prolia [denosumab] Other  (See Comments)   Pain and vision loss. Pt requesting to add as an allergy.    Demerol [meperidine] Nausea And Vomiting, Other (See Comments)   Patient passed out.   Nsaids Other (See Comments)   Not supposed to take NSAIDS per Dr Gustavo Lah due to [facility] Esophagus history Per Nephrologist, not to take NSAIDS        Medication List        Accurate as of December 13, 2022  4:44 PM. If you have any questions, ask your nurse or doctor.          STOP taking these medications    oxybutynin 5 MG 24 hr tablet Commonly known as: Ditropan XL Stopped by: Debroah Loop, PA-C       TAKE these medications    acyclovir 200 MG capsule Commonly known as: ZOVIRAX Take 1 capsule (200 mg total) by mouth 2 (two) times daily.   aspirin EC 81 MG tablet Take 1 tablet (81 mg total) by mouth daily. Swallow whole.   CALCIUM 500 PO Take 1 tablet by mouth daily.   dexamethasone 4 MG tablet Commonly known as: DECADRON Take 4 mg by mouth 2 (two) times daily with a meal. Sig: Take 2 tabs by mouth 2 times daily starting day before chemo. Then take 2 tabs daily for 2 days starting day after chemo. Take with food.   ferrous sulfate 325 (65 FE) MG tablet Take 325 mg by mouth daily.   fluconazole 150 MG tablet Commonly known as: DIFLUCAN Take 1 tablet (150 mg total) by mouth once for 1 dose. Started by: Debroah Loop, PA-C   Gemtesa 75 MG Tabs Generic drug: Vibegron Take 75 mg by mouth daily. Started by: Debroah Loop, PA-C   ibuprofen 200 MG tablet Commonly known as: ADVIL Take 200 mg by mouth every 6 (six) hours as needed.   ipratropium 0.03 % nasal spray Commonly known as: ATROVENT Place 1 spray into both nostrils in the morning.   losartan 25 MG tablet Commonly known as: COZAAR Take 25 mg by mouth daily.   melatonin 5 MG Tabs Take 5 mg by mouth at bedtime.   Mens 50+ Multivitamin Tabs Take 1 tablet by mouth daily.   nystatin cream Commonly known  as: MYCOSTATIN Apply 1 Application topically 2 (two) times daily.   ondansetron 8 MG tablet Commonly known as: Zofran Take 1 tablet (8 mg total) by mouth every 8 (eight) hours as needed for nausea or vomiting.   prochlorperazine 10 MG tablet Commonly known as: COMPAZINE Take 1 tablet (10 mg total) by mouth every 6 (six) hours as needed for nausea or vomiting.   senna 8.6 MG Tabs tablet Commonly known as: SENOKOT Take 2 tablets (17.2 mg total) by mouth daily.   tamsulosin 0.4 MG Caps capsule Commonly known as: FLOMAX Take 1 capsule by mouth daily.  traZODone 50 MG tablet Commonly known as: DESYREL Take 25 mg by mouth at bedtime.   Vitamin D3 125 MCG (5000 UT) Caps Take 5,000 Units by mouth daily.        Allergies:  Allergies  Allergen Reactions   Ciprofloxacin Other (See Comments)    Yeast infection   Demerol [Meperidine Hcl] Other (See Comments)    "I pass out"   Gabapentin Hives   Prolia [Denosumab] Other (See Comments)    Pain and vision loss. Pt requesting to add as an allergy.    Demerol [Meperidine] Nausea And Vomiting and Other (See Comments)    Patient passed out.   Nsaids Other (See Comments)    Not supposed to take NSAIDS per Dr Gustavo Lah due to Barrett's Esophagus history Per Nephrologist, not to take NSAIDS    Family History: Family History  Problem Relation Age of Onset   Breast cancer Mother        dx 3s-50s   Prostate cancer Paternal Grandfather 36   Breast cancer Cousin     Social History:   reports that he quit smoking about 48 years ago. His smoking use included cigarettes. He has a 15.00 pack-year smoking history. He has never used smokeless tobacco. He reports that he does not currently use alcohol after a past usage of about 1.0 standard drink of alcohol per week. He reports that he does not use drugs.  Physical Exam: BP (!) 146/53   Pulse 67   Ht '5\' 7"'$  (1.702 m)   Wt 148 lb (67.1 kg)   BMI 23.18 kg/m   Constitutional:  Alert and  oriented, no acute distress, nontoxic appearing HEENT: [place], AT Cardiovascular: No clubbing, cyanosis, or edema Respiratory: Normal respiratory effort, no increased work of breathing GU: Uncircumcised penis.  Retractile foreskin.  Diffuse erythema of the glans penis extending to the penile mid shaft without induration, fluctuance, or edema. Skin: No rashes, bruises or suspicious lesions Neurologic: Grossly intact, no focal deficits, moving all 4 extremities Psychiatric: Normal mood and affect  Laboratory Data: Results for orders placed or performed in visit on 12/13/22  Bladder Scan (Post Void Residual) in office  Result Value Ref Range   Scan Result 0    Assessment & Plan:   1. Urinary urgency He is emptying fully today, cannot rule out urinary retention.  Symptoms likely secondary to stent discomfort versus underlying malignancy.  With intermittent constipation, I will have him stop oxybutynin and gave him 6 weeks of Gemtesa samples to take instead.  Will have him follow-up with [facility] as scheduled and they may prescribe this for him if they feel it necessary.  I have low suspicion today for misplaced ureteral stent, however given patient concerns we will obtain a KUB today to evaluate stent positioning and contact him with results when available. - Bladder Scan (Post Void Residual) in office - Vibegron (GEMTESA) 75 MG TABS; Take 75 mg by mouth daily.  Dispense: 28 tablet; Refill: 0 - DG Abd 1 View; Future  2. Balanoposthitis On physical exam, his erythema extends to the mid shaft of the penis, however there is no tissue swelling, purulence, crepitus, or induration that would be concerning for penile metastasis or cellulitis.  Will treat with 1 dose of p.o. 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  He is in agreement with this plan. - fluconazole (DIFLUCAN) 150 MG tablet; Take 1 tablet (150 mg total) by mouth once for 1 dose.  Dispense: 1 tablet; Refill: 0  3.  Constipation, unspecified constipation type Intermittent.  We discussed switching to MiraLAX as a nonstimulant laxative alternative to manage this.  Additionally, we will switch from oxybutynin to [facility] as above to reduce anticholinergic contributor.  Return if symptoms worsen or fail to improve, for Will call with results.  Debroah Loop, PA-C  [facility] [address] [place], [place] [address] [phone]

## 2022-12-13 NOTE — Assessment & Plan Note (Signed)
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  Labs are reviewed and discussed with patient. Proceed with cycle 1 Docetaxel today.  Recommend claritin '10mg'$  daily x 4 days after GCSF

## 2022-12-13 NOTE — Assessment & Plan Note (Signed)
Chemotherapy plan as listed above 

## 2022-12-14 ENCOUNTER — Other Ambulatory Visit: Payer: Self-pay | Admitting: Oncology

## 2022-12-14 ENCOUNTER — Telehealth: Payer: Self-pay | Admitting: *Deleted

## 2022-12-14 MED ORDER — HYDROCODONE-ACETAMINOPHEN 5-325 MG PO TABS: 1.0000 | ORAL_TABLET | Freq: Four times a day (QID) | ORAL | 0 refills | Status: DC | PRN

## 2022-12-14 NOTE — Telephone Encounter (Signed)
Patient called requesting that Dr Tasia Catchings order a stronger pain medicine as was offered yesterday at his appointment. "The sooner, the better"

## 2022-12-15 ENCOUNTER — Inpatient Hospital Stay: Payer: Medicare Other

## 2022-12-15 ENCOUNTER — Other Ambulatory Visit: Payer: Self-pay

## 2022-12-15 DIAGNOSIS — C9000 Multiple myeloma not having achieved remission: Secondary | ICD-10-CM

## 2022-12-15 DIAGNOSIS — Z5112 Encounter for antineoplastic immunotherapy: Secondary | ICD-10-CM | POA: Diagnosis not present

## 2022-12-15 MED ORDER — PEGFILGRASTIM-CBQV 6 MG/0.6ML ~~LOC~~ SOSY
6.0000 mg | PREFILLED_SYRINGE | Freq: Once | SUBCUTANEOUS | Status: AC
  Administered 2022-12-15: 6 mg via SUBCUTANEOUS
  Filled 2022-12-15: qty 0.6

## 2022-12-15 NOTE — Telephone Encounter (Signed)
Pt reached out to on call provider last night and a script for pain medication was sent to pharmacy. Dr. Tasia Catchings aware.

## 2022-12-16 ENCOUNTER — Other Ambulatory Visit: Payer: Self-pay

## 2022-12-18 ENCOUNTER — Telehealth: Payer: Self-pay | Admitting: *Deleted

## 2022-12-18 ENCOUNTER — Telehealth: Payer: Self-pay | Admitting: Urology

## 2022-12-18 NOTE — Telephone Encounter (Signed)
Call returned to patient and left voice mail for patient to please contact Urology concerning this problem

## 2022-12-18 NOTE — Telephone Encounter (Signed)
Patient called asking if something can be done for his pain, it is in his penis. He is rating it at 9/10 since last evening. The pain has gotten worse and he has seen Urology about it and is taking the [facility] as ordered. He is taking the Norco every 6 hours and is still having pain with no relief. He is drinking plenty of water to stay hydrated. Of note he did stop his Norco yesterday to see how it would do, but restarted it last night . Please advise

## 2022-12-18 NOTE — Telephone Encounter (Signed)
Pt saw Sam on 1/10.  He states he has different types of cancer and has urgency which leads to pain.  He said he stays in pain and wants appt.  He asked for appt w/[facility], but can't get him in until 2/7.  He is also seeing [facility] urologists and [place].  I know Sam saw him last.

## 2022-12-18 NOTE — Telephone Encounter (Signed)
Spoke with patient and suggested he reach out to [facility] for an acute appt just to have continuity of care. Pt states [facility] can't help him, pt states he needs something done soon. Pt seen Sam on 12/13/22 and she also suggested he return to [facility]. Pt states he stay in pain constantly. Please advise

## 2022-12-19 NOTE — Telephone Encounter (Signed)
Sam's and [facility] notes were reviewed. KUB reviewed and stent in good position. There is nothing else I can offer pt in pain management

## 2022-12-20 ENCOUNTER — Other Ambulatory Visit: Payer: Self-pay

## 2022-12-20 ENCOUNTER — Encounter: Payer: Self-pay | Admitting: Oncology

## 2022-12-20 ENCOUNTER — Inpatient Hospital Stay: Payer: Medicare Other

## 2022-12-20 ENCOUNTER — Inpatient Hospital Stay (HOSPITAL_BASED_OUTPATIENT_CLINIC_OR_DEPARTMENT_OTHER): Payer: Medicare Other | Admitting: Oncology

## 2022-12-20 VITALS — BP 118/47 | HR 64 | Temp 96.7°F | Resp 18 | Wt 142.2 lb

## 2022-12-20 DIAGNOSIS — G893 Neoplasm related pain (acute) (chronic): Secondary | ICD-10-CM

## 2022-12-20 DIAGNOSIS — C669 Malignant neoplasm of unspecified ureter: Secondary | ICD-10-CM

## 2022-12-20 DIAGNOSIS — C9000 Multiple myeloma not having achieved remission: Secondary | ICD-10-CM

## 2022-12-20 DIAGNOSIS — N184 Chronic kidney disease, stage 4 (severe): Secondary | ICD-10-CM

## 2022-12-20 DIAGNOSIS — D631 Anemia in chronic kidney disease: Secondary | ICD-10-CM

## 2022-12-20 DIAGNOSIS — Z5112 Encounter for antineoplastic immunotherapy: Secondary | ICD-10-CM | POA: Diagnosis not present

## 2022-12-20 DIAGNOSIS — C61 Malignant neoplasm of prostate: Secondary | ICD-10-CM | POA: Diagnosis not present

## 2022-12-20 DIAGNOSIS — R300 Dysuria: Secondary | ICD-10-CM

## 2022-12-20 DIAGNOSIS — C787 Secondary malignant neoplasm of liver and intrahepatic bile duct: Secondary | ICD-10-CM

## 2022-12-20 LAB — CBC WITH DIFFERENTIAL/PLATELET
Abs Immature Granulocytes: 0.94 10*3/uL — ABNORMAL HIGH (ref 0.00–0.07)
Basophils Absolute: 0 10*3/uL (ref 0.0–0.1)
Basophils Relative: 0 %
Eosinophils Absolute: 0.1 10*3/uL (ref 0.0–0.5)
Eosinophils Relative: 1 %
HCT: 34.5 % — ABNORMAL LOW (ref 39.0–52.0)
Hemoglobin: 11.4 g/dL — ABNORMAL LOW (ref 13.0–17.0)
Immature Granulocytes: 11 %
Lymphocytes Relative: 9 %
Lymphs Abs: 0.8 10*3/uL (ref 0.7–4.0)
MCH: 31.4 pg (ref 26.0–34.0)
MCHC: 33 g/dL (ref 30.0–36.0)
MCV: 95 fL (ref 80.0–100.0)
Monocytes Absolute: 1.8 10*3/uL — ABNORMAL HIGH (ref 0.1–1.0)
Monocytes Relative: 21 %
Neutro Abs: 5 10*3/uL (ref 1.7–7.7)
Neutrophils Relative %: 58 %
Platelets: 112 10*3/uL — ABNORMAL LOW (ref 150–400)
RBC: 3.63 MIL/uL — ABNORMAL LOW (ref 4.22–5.81)
RDW: 13.7 % (ref 11.5–15.5)
WBC: 8.6 10*3/uL (ref 4.0–10.5)
nRBC: 0 % (ref 0.0–0.2)

## 2022-12-20 LAB — COMPREHENSIVE METABOLIC PANEL
ALT: 21 U/L (ref 0–44)
AST: 24 U/L (ref 15–41)
Albumin: 3.2 g/dL — ABNORMAL LOW (ref 3.5–5.0)
Alkaline Phosphatase: 75 U/L (ref 38–126)
Anion gap: 8 (ref 5–15)
BUN: 44 mg/dL — ABNORMAL HIGH (ref 8–23)
CO2: 26 mmol/L (ref 22–32)
Calcium: 8.6 mg/dL — ABNORMAL LOW (ref 8.9–10.3)
Chloride: 102 mmol/L (ref 98–111)
Creatinine, Ser: 2.53 mg/dL — ABNORMAL HIGH (ref 0.61–1.24)
GFR, Estimated: 25 mL/min — ABNORMAL LOW (ref >60)
Glucose, Bld: 111 mg/dL — ABNORMAL HIGH (ref 70–99)
Potassium: 4.5 mmol/L (ref 3.5–5.1)
Sodium: 136 mmol/L (ref 135–145)
Total Bilirubin: 0.6 mg/dL (ref 0.3–1.2)
Total Protein: 5.8 g/dL — ABNORMAL LOW (ref 6.5–8.1)

## 2022-12-20 MED ORDER — HYDROCODONE-ACETAMINOPHEN 5-325 MG PO TABS: 1.0000 | ORAL_TABLET | Freq: Four times a day (QID) | ORAL | 0 refills | Status: DC | PRN

## 2022-12-20 NOTE — Assessment & Plan Note (Signed)
Intermittent chronic issue for him Previous urine culture is negative.  Continue Gemteza per urologist recommendation.  Urology feels that his symptom is most likely secondary to inflammation from stent.  Dr. Bernardo Heater reviewed KUB and the stent is in good position.  No additional urology intervention to help his current symptoms.

## 2022-12-20 NOTE — Progress Notes (Signed)
Hematology/Oncology Progress note [phone] [phone]      Patient Care Team: Baxter Hire, MD as PCP - General (Internal Medicine) Noreene Filbert, MD as Radiation Oncologist (Radiation Oncology) Baxter Hire, MD (Internal Medicine) Lavonia Dana, MD as Consulting Physician (Nephrology) [name], MD as Consulting Physician (Oncology)  ASSESSMENT & PLAN:   Cancer Staging  Multiple myeloma [name]) Staging form: Plasma Cell Myeloma and Plasma Cell Disorders, AJCC 8th Edition - Clinical stage from 03/17/2022: RISS Stage III (Beta-2-microglobulin (mg/L): 6.9, Albumin (g/dL): 3.3, ISS: Stage III, High-risk cytogenetics: Present, LDH: Normal) - Signed by [name], MD on 03/17/2022  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 [name]) Staging form: Prostate, AJCC 8th Edition - Clinical: Stage IVB (cTX, cNX, pM1, PSA: 7, Grade Group: 3) - Signed by [name], MD on 10/12/2022  Urothelial carcinoma of distal ureter [name]) Staging form: Renal Pelvis and Ureter, AJCC 8th Edition - Clinical: Stage Unknown (cTX, cN0, cM0) - Signed by [name], MD on 03/17/2022   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  Overall he tolerates well.      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  10/31/2022 repeat cystoscopy did not reveal any recurrence. - He has  ureter stent, Recommend patient continue follow up with Urology for repeat cystoscopy.Marland Kitchen  Possible need of starting [place] and Padcev in the near future if recurrence develops.   Anemia in stage 4 chronic kidney disease (HCC) Stable hemoglobin. monitor   Dysuria Intermittent chronic issue for him Previous urine culture is negative.  Continue Gemteza per urologist recommendation.  Urology feels that his symptom is most likely secondary to inflammation from stent.  Dr. Bernardo Heater reviewed KUB and the stent is in good position.  No additional urology intervention to help his current symptoms.  Multiple myeloma (HCC) #Stage III IgA kappa multiple myeloma, myeloma FISH panel positive for 13q deletion, gain of 1q, t (4;14), high risk, not candidate for autologous bone marrow transplant. 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  I will hold off Daratumumab and let him start on Docetaxel treatments.  Expect that his myeloma will progress in the future. Currently, will prioritize his treatment for prostate cancer  Continue Acyclovir '200mg'$  BID  -Bone health, currently he does not have an active myeloma bone involvement.  Patient reports history of developing adverse reaction [vision loss] after Prolia treatments. -s/p  root canal. ophthalmologist clearance obtained for starting Xgeva. . -will start along with his next visit.   Stage 4 chronic kidney disease (HCC) Avoid nephrotoxins.  Encourage oral hydration.   Neoplasm related pain Continue Percocet as needed as instructed.  Refills sent.  Follow-up in 2 weeks for next cycles of  chemotherapy. All questions were answered. The patient  knows to call the clinic with any problems, questions or concerns.  [name], MD, PhD [facility] Hematology Oncology 12/20/2022    CHIEF COMPLAINTS/REASON FOR VISIT:  Multiple myeloma, high-grade urothelial carcinoma.metastatic prostate cancer.   HISTORY OF PRESENTING ILLNESS:  [name] [name] is a 82 y.o. male presents for follow-up of multiple myeloma and high-grade urothelial carcinoma, and metastatic castration resistant prostate cancer.   Oncology History  Multiple myeloma ([place])  03/02/2022 Imaging   PET showed No definitive signs of multiple myeloma by FDG PET.   Obstruction of the RIGHT ureter with soft tissue mass in the RIGHT pelvis showing increased metabolic activity suspicious first and foremost for urothelial neoplasm given location adjacent to RIGHT UVJ and affect upon the RIGHT ureter.   Little or no excretion of FDG on the RIGHT but still with uptake of FDG by renal parenchyma. Degree of hydronephrosis is not substantially changed but lack of FDG excretion on the RIGHT is compatible with secondary sign of physiologic significance of ureteral obstruction.   RIGHT pelvic sidewall uptake without visible lesion, could represent tiny lymph node not visible on noncontrast imaging.   Aortic atherosclerosis.  Pulmonary emphysema. Aortic Atherosclerosis and Emphysema    03/17/2022 Initial Diagnosis   Multiple myeloma ([place]) -Patient has progressively worsening kidney function.  He is scheduled for kidney biopsy. 01/26/2022, free kappa light chain 1058, lambda 15.3, light chain ratio 69. Protein electrophoresis showed restricted band-M spike migrating in the beta-1 globulin region. ANA negative.  ANCA negative. Random urine protein electrophoresis showed abnormal protein band detected in the gamma globulin And a second possible abnormal protein band that may represent monoclonal protein.   02/23/2022 multiple myeloma showed  IgA 2883, M protein of 1.8, beta 2 microglobulin 6.9, kappa free light chain 1268.9, lambda 13.2, free light chain ratio 96.13.   CMP showed creatinine of 3.41, that is significantly worse than his baseline in June 2022. 02/28/2022, 24-hour urine protein electrophoresis showed M protein of 729 mg, IgA monoclonal kappa light chain. 03/01/2022, [facility] kidney biopsy showed diffuse light chain tubulopathy, and light chain cast nephropathy, moderate interstitial fibrosis,4% global glomerular sclerosis and the marked atherosclerosis.  03/06/2022, patient underwent a bone marrow biopsy. Pathology showed hypercellular bone marrow with plasma cell neoplasm, representing 20% of all cells in the aspirate associated with prominent interstitial infiltrates and numerous predominantly small clusters in the clots in the biopsy sections.  The plasma cells display kappa light chain restriction consistent with plasma cell neoplasm. Normal cytogenetics, myeloma FISH panel positive for 13q deletion, gain of 1q, t (4;14)   03/17/2022 Cancer Staging   Staging form: Plasma Cell Myeloma and Plasma Cell Disorders, AJCC 8th Edition - Clinical stage from 03/17/2022: RISS Stage III (Beta-2-microglobulin (mg/L): 6.9, Albumin (g/dL): 3.3, ISS: Stage III, High-risk cytogenetics: Present, LDH: Normal) - Signed by [name], MD on 03/17/2022 Stage prefix: Initial diagnosis Beta 2 microglobulin range (mg/L): Greater than or equal to 5.5 Albumin range (g/dL): Less than 3.5 Cytogenetics: t(4;14) translocation, Other mutation, 1q addition   03/24/2022 - 07/19/2022 Chemotherapy   Patient is on Treatment Plan : MYELOMA NEWLY DIAGNOSED Daratumumab + Dexamethasone Weekly (DaraRd) q28d     09/21/2022 Imaging   MRI lumbar spine wo contrast 1. 6 mm nodule in the cauda equina at the L4-5 level. 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 Recommend MRI of the lumbar spine with contrast for further evaluation. 2.  12 mm T2 hyperintense lesion along the inferior aspect of  the S1 vertebral body. This may represent focal involvement of multiple myeloma. No other discrete lesions are present. No pathologic fracture is present. 3. Mild bilateral foraminal stenosis at L3-4 and L4-5. 4. Left paramedian annular tear at L5-S1 near the left S1 nerve roots.   12/13/2022 -  Chemotherapy   Patient is on Treatment Plan : PROSTATE Docetaxel (75) q21d     01/10/2023 - 01/10/2023 Chemotherapy   Patient is on Treatment Plan : PROSTATE Docetaxel (75) + Prednisone q21d     Urothelial carcinoma of distal ureter ([place])  03/02/2022 Imaging   PET showed No definitive signs of multiple myeloma by FDG PET.   Obstruction of the RIGHT ureter with soft tissue mass in the RIGHT pelvis showing increased metabolic activity suspicious first and foremost for urothelial neoplasm given location adjacent to RIGHT UVJ and affect upon the RIGHT ureter.   Little or no excretion of FDG on the RIGHT but still with uptake of FDG by renal parenchyma. Degree of hydronephrosis is not substantially changed but lack of FDG excretion on the RIGHT is compatible with secondary sign of physiologic significance of ureteral obstruction.   RIGHT pelvic sidewall uptake without visible lesion, could represent tiny lymph node not visible on noncontrast imaging.   Aortic atherosclerosis.  Pulmonary emphysema. Aortic Atherosclerosis and Emphysema    03/17/2022 Initial Diagnosis   Urothelial carcinoma of distal ureter ([place])  -03/14/2022, cystoscopy showed diffuse narrowing right distal ureter with hydroureter following up the distal ureter and extending proximally.  Right ureteroscopy showed nodular tumor distal ureter and a convincing area without tumor suspicious for extrinsic obstruction.s/p   right ureteral stent placement. Ureteral tumor showed a tiny fragments of fibrous tissue, interpretation limited by thermal and crush artifact.  Right distal ureter saline  barbotage showed hight grade urothelial carcinoma.    03/17/2022 Cancer Staging   Staging form: Renal Pelvis and Ureter, AJCC 8th Edition - Clinical: Stage Unknown (cTX, cN0, cM0) - Signed by [name], MD on 03/17/2022 Stage prefix: Initial diagnosis WHO/ISUP grade (low/high): High Grade Histologic grading system: 2 grade system   06/05/2022 Imaging   PET scan at [facility] showed 1.Interval placement of right-sided double-J ureteral stent with resolution of hydronephrosis and improved excretion of radiotracer.  2.There is persistent abnormal hypermetabolic tissue extending posterior lateral from the right aspect of the bladder encasing the distal ureter/stent which measures approximately 5.1 x 4.2 (CT image 243).  3.There are similar linear areas of abnormal hypermetabolic tissue seen to extend more superiorly along the right posterior pelvic wall/peritoneum, and towards the sciatic notch (fused image 238). Linear configuration suggests possible perineural spread. Note that this soft tissue abuts and may encase pelvic vasculature including the external and internal iliac arteries.  4.NEW single subcentimeter but abnormal focus of FDG avidity in the right presacral region (CT image 231) measuring 1.6 cm representing new site of neoplastic disease   06/19/2022 - 08/10/2022 Radiation Therapy   Pelvis RT 06/19/22-08/21/22 Bladder RT 07/27/22- 08/20/22    09/18/2022 Imaging   PET scan at [facility] showed 1. Evidence of disease progression with multifocal new hypermetabolic lesions around the periphery of the liver as described above. The evaluation of these lesions is limited by significant misregistration between the PET and CT portions of the examination. Contrast-enhanced CT or MRI could be used for confirmation and/or guidance of biopsy as necessary. There is also a lung parenchymal nodule with new associated FDG uptake, also suspicious for a site of metastasis.   2. Although the right  nephroureteral stent appears  to be well positioned, the right kidney demonstrates slightly increased hydronephrosis with significant surrounding fat stranding and relatively delayed clearance of FDG. These findings could suggest stent malfunction or be related to upper urinary tract infection. Suggest clinical correlation with any patient signs or symptoms such as right flank pain, fever, and/or urinalysis findings.     09/21/2022 Imaging   MRI lumbar spine wo contrast 1. 6 mm nodule in the cauda equina at the L4-5 level. 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 Recommend MRI of the lumbar spine with contrast for further evaluation. 2. 12 mm T2 hyperintense lesion along the inferior aspect of the S1 vertebral body. This may represent focal involvement of multiple myeloma. No other discrete lesions are present. No pathologic fracture is present. 3. Mild bilateral foraminal stenosis at L3-4 and L4-5. 4. Left paramedian annular tear at L5-S1 near the left S1 nerve roots.    Genetic Testing   Negative genetic testing. No pathogenic variants identified on the [facility] Multi-Cancer+RNA panel. The report date is 11/19/2022.  The Multi-Cancer + RNA Panel offered by [facility] includes sequencing and/or deletion/duplication analysis of the following 70 genes:  AIP*, ALK, APC*, ATM*, AXIN2*, BAP1*, BARD1*, BLM*, BMPR1A*, BRCA1*, BRCA2*, BRIP1*, CDC73*, CDH1*, CDK4, CDKN1B*, CDKN2A, CHEK2*, CTNNA1*, DICER1*, EPCAM, EGFR, FH*, FLCN*, GREM1, HOXB13, KIT, LZTR1, MAX*, MBD4, MEN1*, MET, MITF, MLH1*, MSH2*, MSH3*, MSH6*, MUTYH*, NF1*, NF2*, NTHL1*, PALB2*, PDGFRA, PMS2*, POLD1*, POLE*, POT1*, PRKAR1A*, PTCH1*, PTEN*, RAD51C*, RAD51D*, RB1*, RET, SDHA*, SDHAF2*, SDHB*, SDHC*, SDHD*, SMAD4*, SMARCA4*, SMARCB1*, SMARCE1*, STK11*, SUFU*, TMEM127*, TP53*, TSC1*, TSC2*, VHL*. RNA analysis is performed for * genes.   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 Evidence of local prostate carcinoma within the pelvis with peritoneal implants along the bowel serosal surface as well as a metastatic LEFT iliac lymph node. 2. 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 3. Multifocal pleural metastasis in the RIGHT hemithorax. 4. No clear evidence of skeletal metastasis      Genetic Testing   Negative genetic testing. No pathogenic variants identified on the [facility] Multi-Cancer+RNA panel. The report date is 11/19/2022.  The Multi-Cancer + RNA Panel offered by [facility] includes sequencing and/or deletion/duplication analysis of the following 70 genes:  AIP*, ALK, APC*, ATM*, AXIN2*, BAP1*, BARD1*, BLM*, BMPR1A*, BRCA1*, BRCA2*, BRIP1*, CDC73*, CDH1*, CDK4, CDKN1B*, CDKN2A, CHEK2*, CTNNA1*, DICER1*, EPCAM, EGFR, FH*, FLCN*, GREM1, HOXB13, KIT, LZTR1, MAX*, MBD4, MEN1*, MET, MITF, MLH1*, MSH2*, MSH3*, MSH6*, MUTYH*, NF1*, NF2*, NTHL1*, PALB2*, PDGFRA, PMS2*, POLD1*, POLE*, POT1*, PRKAR1A*, PTCH1*, PTEN*, RAD51C*, RAD51D*, RB1*, RET, SDHA*, SDHAF2*, SDHB*, SDHC*, SDHD*, SMAD4*, SMARCA4*, SMARCB1*, SMARCE1*, STK11*, SUFU*, TMEM127*, TP53*, TSC1*, TSC2*, VHL*. RNA analysis is performed for * genes.    11/27/22 UA showed RBC >50, moderate leukocytes, Urine culture negative.  Cipro did not provide him any relieve. Was seen by symptomatic management clinic and was prescribed a course steroid and Keflex.  11/28/22 CXR showed bronchitis - URI symptom has improved.  11/29/22 US renal Mild right hydronephrosis and proximal hydroureter. Low level echoes within the visualized proximal  right  ureter and renal pelvis,which may reflect blood products or debris.2. Stent is partially visualized within the urinary bladder. Right ureteral jet was not definitively seen.  He was seen by [facility] urology and his symptoms were felt to be due to post treatment inflammation. He was recommended to take Flomax and NSAIDS.  Due to his CKD, we recommend him not to take NSAIDS.  Today he is here for chemotherapy treatments.  He continues to have increased urine frequently, burning with urination, and doesn't feel like he is emptying well. Also swelling of penis.  We arranged him to be seen by [facility] urology team today and his symptoms was felt to be due to stent discomfort versus underlying malignancy and constipation may also contribute to his symtoms.  Marland Kitchen He was recommended to start [place]. Miralax for constipation.  Balanoposthitis, he will take one dose of diflucan.   INTERVAL HISTORY [name] [name] is a 82 y.o. male who has above history reviewed by me today presents for follow up visit for multiple myeloma and high-grade urothelial carcinoma, metastatic prostate cancer.  Patient continues to have dysuria/urinary urgency/doesn't feel like he is emptying well .  Occasionally he sees blood in the urine. He has also experienced pelvic pain after receiving G-CSF.  He took Percocet which helped with that but he does not feel Percocet helps with burning sensation during urination.  He understands that symptoms may be secondary to his stent.  He is frustrated that both urologist office were not able to obtain further with the urination symptoms.   Review of Systems  Constitutional:  Positive for fatigue. Negative for appetite change, chills, fever and unexpected weight change.  HENT:   Negative for hearing loss and voice change.   Eyes:  Negative for eye problems and icterus.  Respiratory:  Negative for chest tightness, cough and shortness of breath.   Cardiovascular:  Negative for chest pain and leg swelling.   Gastrointestinal:  Positive for constipation. Negative for abdominal distention, abdominal pain and diarrhea.  Endocrine: Negative for hot flashes.  Genitourinary:  Positive for dysuria and frequency. Negative for difficulty urinating.   Musculoskeletal:  Negative for arthralgias.  Skin:  Negative for itching.  Neurological:  Negative for light-headedness and numbness.  Hematological:  Negative for adenopathy. Does not bruise/bleed easily.  Psychiatric/Behavioral:  Negative for confusion.      MEDICAL HISTORY:  Past Medical History:  Diagnosis Date   Age related osteoporosis    Anemia    Barrett's esophagus    Bradycardia    Cancer (HCC)    PROSTATE   Cataract    CKD (chronic kidney disease) stage 3, GFR 30-59 ml/min (HCC)    Colon polyps    DDD (degenerative disc disease), cervical    DDD (degenerative disc disease), lumbar    Femur fracture (HCC)    Right   Fundic gland polyposis of stomach    Fundic gland polyps of stomach, benign    Gastritis    GERD (gastroesophageal reflux disease)    Hematuria    Hemorrhage of rectum and anus    History of colon polyps    Insomnia    Lumbago    Osteopenia of the elderly    Skin cancer    Sleep apnea     SURGICAL HISTORY: Past Surgical History:  Procedure Laterality Date   BAND HEMORRHOIDECTOMY     COLONOSCOPY     COLONOSCOPY WITH PROPOFOL N/A 05/06/2018   Procedure: COLONOSCOPY WITH PROPOFOL;  Surgeon: Gustavo Lah,  Billie Ruddy, MD;  Location: [facility] ENDOSCOPY;  Service: Endoscopy;  Laterality: N/A;   COLONOSCOPY WITH PROPOFOL N/A 09/23/2018   Procedure: COLONOSCOPY WITH PROPOFOL;  Surgeon: Lollie Sails, MD;  Location: [facility] ENDOSCOPY;  Service: Endoscopy;  Laterality: N/A;   COLONOSCOPY WITH PROPOFOL N/A 03/30/2021   Procedure: COLONOSCOPY WITH PROPOFOL;  Surgeon: Toledo, Benay Pike, MD;  Location: [facility] ENDOSCOPY;  Service: Gastroenterology;  Laterality: N/A;   CYSTOSCOPY W/ URETERAL STENT PLACEMENT Right 03/14/2022    Procedure: CYSTOSCOPY WITH RETROGRADE PYELOGRAM/URETERAL STENT PLACEMENT;  Surgeon: Abbie Sons, MD;  Location: [facility] ORS;  Service: Urology;  Laterality: Right;   ESOPHAGOGASTRODUODENOSCOPY (EGD) WITH PROPOFOL N/A 10/03/2016   Procedure: ESOPHAGOGASTRODUODENOSCOPY (EGD) WITH PROPOFOL;  Surgeon: Lollie Sails, MD;  Location: [facility] ENDOSCOPY;  Service: Endoscopy;  Laterality: N/A;   EYE SURGERY     CATARACTS   EYELID SURGERY     FLEXIBLE SIGMOIDOSCOPY     FRACTURE SURGERY Right 2016   [place]   FRACTURE SURGERY     ORIF DISTAL FEMUR FRACTURE     TONSILLECTOMY     TRANSURETHRAL RESECTION OF BLADDER TUMOR N/A 03/14/2022   Procedure: TRANSURETHRAL RESECTION OF BLADDER TUMOR (TURBT);  Surgeon: Abbie Sons, MD;  Location: [facility] ORS;  Service: Urology;  Laterality: N/A;    SOCIAL HISTORY: Social History   Socioeconomic History   Marital status: Married    Spouse name: Not on file   Number of children: Not on file   Years of education: Not on file   Highest education level: Not on file  Occupational History   Not on file  Tobacco Use   Smoking status: Former    Packs/day: 1.00    Years: 15.00    Total pack years: 15.00    Types: Cigarettes    Quit date: 10/03/1974    Years since quitting: 48.2   Smokeless tobacco: Never  Vaping Use   Vaping Use: Never used  Substance and Sexual Activity   Alcohol use: Not Currently    Alcohol/week: 1.0 standard drink of alcohol    Types: 1 Cans of beer per week    Comment: 1 beer every 2 weeks   Drug use: No   Sexual activity: Yes    Birth control/protection: None  Other Topics Concern   Not on file  Social History Narrative   ** Merged History Encounter **       Social Determinants of Health   Financial Resource Strain: Not on file  Food Insecurity: Not on file  Transportation Needs: Not on file  Physical Activity: Not on file  Stress: Not on file  Social Connections: Not on file  Intimate Partner Violence: Not on file     FAMILY HISTORY: Family History  Problem Relation Age of Onset   Breast cancer Mother        dx 15s-50s   Prostate cancer Paternal Grandfather 96   Breast cancer Cousin     ALLERGIES:  is allergic to ciprofloxacin, demerol [meperidine hcl], gabapentin, prolia [denosumab], demerol [meperidine], and nsaids.  MEDICATIONS:  Current Outpatient Medications  Medication Sig Dispense Refill   acyclovir (ZOVIRAX) 200 MG capsule Take 1 capsule (200 mg total) by mouth 2 (two) times daily. 60 capsule 3   Calcium Carbonate (CALCIUM 500 PO) Take 1 tablet by mouth daily.     Cholecalciferol (VITAMIN D3) 125 MCG (5000 UT) CAPS Take 5,000 Units by mouth daily.     dexamethasone (DECADRON) 4 MG tablet Take 4 mg by mouth  2 (two) times daily with a meal. Sig: Take 2 tabs by mouth 2 times daily starting day before chemo. Then take 2 tabs daily for 2 days starting day after chemo. Take with food.     ferrous sulfate 325 (65 FE) MG tablet Take 325 mg by mouth daily.     ipratropium (ATROVENT) 0.03 % nasal spray Place 1 spray into both nostrils in the morning.     losartan (COZAAR) 25 MG tablet Take 25 mg by mouth daily.     melatonin 5 MG TABS Take 5 mg by mouth at bedtime.     Multiple Vitamins-Minerals (MENS 50+ MULTIVITAMIN) TABS Take 1 tablet by mouth daily.     nystatin cream (MYCOSTATIN) Apply 1 Application topically 2 (two) times daily. 30 g 0   senna (SENOKOT) 8.6 MG TABS tablet Take 2 tablets (17.2 mg total) by mouth daily. 120 tablet 0   tamsulosin (FLOMAX) 0.4 MG CAPS capsule Take 1 capsule by mouth daily.     traZODone (DESYREL) 50 MG tablet Take 25 mg by mouth at bedtime.     Vibegron (GEMTESA) 75 MG TABS Take 75 mg by mouth daily. 28 tablet 0   aspirin EC 81 MG tablet Take 1 tablet (81 mg total) by mouth daily. Swallow whole. (Patient not taking: Reported on 12/07/2022) 30 tablet 11   [START ON 12/22/2022] HYDROcodone-acetaminophen (NORCO) 5-325 MG tablet Take 1 tablet by mouth every 6 (six)  hours as needed for moderate pain. 30 tablet 0   ondansetron (ZOFRAN) 8 MG tablet Take 1 tablet (8 mg total) by mouth every 8 (eight) hours as needed for nausea or vomiting. (Patient not taking: Reported on 12/12/2022) 30 tablet 1   prochlorperazine (COMPAZINE) 10 MG tablet Take 1 tablet (10 mg total) by mouth every 6 (six) hours as needed for nausea or vomiting. (Patient not taking: Reported on 12/20/2022) 30 tablet 1   No current facility-administered medications for this visit.     PHYSICAL EXAMINATION: ECOG PERFORMANCE STATUS: 1 - Symptomatic but completely ambulatory Today's Vitals   12/20/22 0916  BP: (!) 118/47  Pulse: 64  Resp: 18  Temp: (!) 96.7 F (35.9 C)  Weight: 142 lb 3.2 oz (64.5 kg)  PainSc: 3    Body mass index is 22.27 kg/m.   Physical Exam Constitutional:      General: He is not in acute distress. HENT:     Head: Normocephalic and atraumatic.  Eyes:     General: No scleral icterus. Cardiovascular:     Rate and Rhythm: Normal rate.  Pulmonary:     Effort: Pulmonary effort is normal. No respiratory distress.     Breath sounds: No wheezing.  Abdominal:     General: There is no distension.     Palpations: Abdomen is soft.  Musculoskeletal:        General: No deformity. Normal range of motion.     Cervical back: Normal range of motion and neck supple.  Skin:    General: Skin is warm and dry.     Findings: No erythema.  Neurological:     Mental Status: He is alert and oriented to person, place, and time. Mental status is at baseline.     Cranial Nerves: No cranial nerve deficit.  Psychiatric:        Mood and Affect: Mood normal.      LABORATORY DATA:  I have reviewed the data as listed    Latest Ref Rng & Units 12/20/2022  9:05 AM 12/13/2022    8:34 AM 12/07/2022    8:51 AM  CBC  WBC 4.0 - 10.5 K/uL 8.6  14.1  7.1   Hemoglobin 13.0 - 17.0 g/dL 11.4  11.1  11.9   Hematocrit 39.0 - 52.0 % 34.5  33.1  35.5   Platelets 150 - 400 K/uL 112  202  241         Latest Ref Rng & Units 12/20/2022    9:05 AM 12/13/2022    8:34 AM 12/07/2022    8:51 AM  CMP  Glucose 70 - 99 mg/dL 111  133  80   BUN 8 - 23 mg/dL 44  47  42   Creatinine 0.61 - 1.24 mg/dL 2.53  2.15  2.11   Sodium 135 - 145 mmol/L 136  140  139   Potassium 3.5 - 5.1 mmol/L 4.5  4.3  4.3   Chloride 98 - 111 mmol/L 102  109  104   CO2 22 - 32 mmol/L '26  23  27   '$ Calcium 8.9 - 10.3 mg/dL 8.6  8.9  8.6   Total Protein 6.5 - 8.1 g/dL 5.8  5.5  6.0   Total Bilirubin 0.3 - 1.2 mg/dL 0.6  0.5  0.6   Alkaline Phos 38 - 126 U/L 75  74  79   AST 15 - 41 U/L '24  24  22   '$ ALT 0 - 44 U/L '21  19  23     '$ Iron/TIBC/Ferritin/ %Sat    Component Value Date/Time   IRON 60 02/23/2022 1523   TIBC 238 (L) 02/23/2022 1523   FERRITIN 86 02/23/2022 1523   IRONPCTSAT 25 02/23/2022 1523       RADIOGRAPHIC STUDIES: I have personally reviewed the radiological images as listed and agreed with the findings in the report.  DG Abd 1 View  Result Date: 12/14/2022 CLINICAL DATA:  Ureteral stent. EXAM: ABDOMEN - 1 VIEW COMPARISON:  Abdominal x-ray 11/29/2022 FINDINGS: The right ureteral stent is unchanged in position. Bowel-gas pattern is nonobstructive. There is a large amount of stool throughout the colon. Phleboliths are again noted in the pelvis, unchanged. Three right-sided hip screws are present. IMPRESSION: 1. Right ureteral stent is unchanged in position. 2. Large amount of stool throughout the colon. Electronically Signed   By: Ronney Asters M.D.   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 Additional history of bladder cancer. EXAM: NUCLEAR MEDICINE PET SKULL BASE TO THIGH TECHNIQUE: 9.7 mCi F18 Piflufolastat (Pylarify) was injected intravenously. Full-ring PET imaging was performed from the skull base to thigh after the radiotracer. CT data was obtained and  used for attenuation correction and anatomic localization. COMPARISON:  FDG PET scan 05/02/2022 FINDINGS: NECK No radiotracer activity in neck lymph nodes. Incidental CT finding: None. CHEST Intense radiotracer activity at the junction of the first rib in the manubrium along the pleural surface with SUV max equal 15.1 (image 81). Intense activity within the RIGHT lower lobe along the pleural surface with SUV max equal 27.8 (image 132). There is mild pleural thickening at this level. Additional lesion along the pleural surface of the RIGHT lower lobe on image 110 with SUV max equal 27. Small lymph node position between the esophagus and descending thoracic aorta adjacent to the spine with SUV max equal 11.5 on image 116. Potential small lymph node evident  on the CT portion exam measuring 2-3 mm. Incidental CT finding: No discrete pulmonary nodules. ABDOMEN/PELVIS Prostate: No focal activity in prostatectomy bed. Intense metabolic activity within the deep pelvis adjacent to the rectum with SUV max equal 30.4 (image 223). No clear CT correlation on noncontrast exam. Potential serosal metastatic implant. Additional potential small bowel serosal implant on image 220 in the deep LEFT pelvis. Lymph nodes: Radiotracer avid LEFT external iliac lymph node measuring 12 mm image 211 SUV max equal 25.8. Liver: Multifocal round lesion bilobed hepatic lesions with intense radiotracer activity. Example lesion in the superior LEFT hepatic lobe measuring 35 mm with SUV max equal 29.6 (image 130).\ Example lesion in the RIGHT hepatic lobe measuring 38 mm SUV max equal 36.4. Approximately 15 lesions in the liver. Incidental CT finding: Double-J ureteral stent on the RIGHT without obstruction. SKELETON No clear evidence skeletal metastasis. Lesions in the RIGHT pleural space adjacent ribs are favored pleural lesions rather than skeletal lesions. IMPRESSION: 1. Evidence of local prostate carcinoma within the pelvis with peritoneal  implants along the bowel serosal surface as well as a metastatic LEFT iliac lymph node. 2. 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 3. Multifocal pleural metastasis in the RIGHT hemithorax. 4. No clear evidence of skeletal metastasis Electronically Signed   By: Suzy Bouchard M.D.   On: 12/05/2022 16:39   US RENAL  Result Date: 11/29/2022 CLINICAL DATA:  Dysuria, hematuria, urothelial carcinoma EXAM: RENAL / URINARY TRACT ULTRASOUND COMPLETE COMPARISON:  Ultrasound 02/02/2022, PET-CT 03/02/2022 FINDINGS: Right Kidney: Renal measurements: 9.5 x 5.0 x 6.9 cm = volume: 173 mL. Echogenicity within normal limits. No mass or shadowing stone visualized. Mild hydronephrosis. The proximal right ureter is dilated. Low level echoes within the visualized proximal ureter and right renal pelvis. Left Kidney: Renal measurements: 10.2 x 7.1 x 5.5 cm = volume: 204 mL. Echogenicity within normal limits. Benign cysts within the lower pole of the left kidney which do not require follow-up imaging. No shadowing stone or hydronephrosis visualized. Bladder: Appears normal for degree of bladder distention. Stent is partially visualized within the urinary bladder. Right ureteral jet was not definitively seen. Other: None. IMPRESSION: 1. Mild right hydronephrosis and proximal hydroureter. Low level echoes within the visualized proximal right ureter and renal pelvis, which may reflect blood products or debris. 2. Stent is partially visualized within the urinary bladder. Right ureteral jet was not definitively seen. Electronically Signed   By: Davina Poke D.O.   On: 11/29/2022 14:54   DG Abd 1 View  Result Date: 11/29/2022 CLINICAL DATA:  Flank pain, dysuria EXAM: ABDOMEN - 1 VIEW COMPARISON:  PET-CT done on 03/02/2022 FINDINGS: Bowel gas pattern is nonspecific. Right ureteral stent is seen in place. Phleboliths are seen in pelvis. There is previous internal fixation in the right femur.  IMPRESSION: Right ureteral stent is noted in place. Nonspecific bowel gas pattern. Electronically Signed   By: Elmer Picker M.D.   On: 11/29/2022 14:49   DG Chest 2 View  Result Date: 11/28/2022 CLINICAL DATA:  Cough.  Metastatic prostate cancer. EXAM: CHEST - 2 VIEW COMPARISON:  06/21/2022 FINDINGS: Heart size is normal. Mild aortic tortuosity. Bronchial thickening suggesting bronchitis. No consolidation, collapse or effusion. No acute bone finding. IMPRESSION: Bronchitis pattern. No consolidation or collapse. Electronically Signed   By: Nelson Chimes M.D.   On: 11/28/2022 12:19   US BIOPSY (LIVER)  Result Date: 10/06/2022 INDICATION: History of multiple myeloma, new liver lesions by PET-CT EXAM: ULTRASOUND CORE BIOPSY  RIGHT INFERIOR LIVER LESION MEDICATIONS: 1% LIDOCAINE LOCAL ANESTHESIA/SEDATION: Moderate (conscious) sedation was employed during this procedure. A total of Versed 1.0 mg and Fentanyl 50 mcg was administered intravenously by the radiology nurse. Total intra-service moderate Sedation Time: 10 minutes. The patient's level of consciousness and vital signs were monitored continuously by radiology nursing throughout the procedure under my direct supervision. COMPLICATIONS: None immediate. PROCEDURE: Informed written consent was obtained from the patient after a thorough discussion of the procedural risks, benefits and alternatives. All questions were addressed. Maximal Sterile Barrier Technique was utilized including caps, mask, sterile gowns, sterile gloves, sterile drape, hand hygiene and skin antiseptic. A timeout was performed prior to the initiation of the procedure. previous imaging reviewed. preliminary ultrasound performed. a right inferior hepatic lesion was localized and marked for biopsy. under sterile conditions and local anesthesia, the 17 gauge 6.8 cm access was advanced from a lower intercostal approach to the lesion. needle position confirmed with ultrasound. images  obtained for documentation. 2 18 gauge cores obtained. samples placed in formalin. needle tract occluded with gel-foam. postprocedure imaging demonstrates no hemorrhage or hematoma. patient tolerated biopsy well. IMPRESSION: Successful CT-guided core biopsy of a right inferior hepatic lesion. Electronically Signed   By: Jerilynn Mages.  Shick M.D.   On: 10/06/2022 10:42   US Venous Img Upper Uni Right  Result Date: 09/22/2022 CLINICAL DATA:  82 year old male with a history of facial swelling EXAM: RIGHT UPPER EXTREMITY VENOUS DOPPLER ULTRASOUND TECHNIQUE: Gray-scale sonography with graded compression, as well as color Doppler and duplex ultrasound were performed to evaluate the upper extremity deep venous system from the level of the subclavian vein and including the jugular, axillary, basilic, radial, ulnar and upper cephalic vein. Spectral Doppler was utilized to evaluate flow at rest and with distal augmentation maneuvers. COMPARISON:  None Available. FINDINGS: Contralateral Subclavian Vein: Respiratory phasicity is normal and symmetric with the symptomatic side. No evidence of thrombus. Normal compressibility. Internal Jugular Vein: No evidence of thrombus. Normal compressibility, respiratory phasicity and response to augmentation. Subclavian Vein: No evidence of thrombus. Normal compressibility, respiratory phasicity and response to augmentation. Axillary Vein: No evidence of thrombus. Normal compressibility, respiratory phasicity and response to augmentation. Cephalic Vein: No evidence of thrombus. Normal compressibility, respiratory phasicity and response to augmentation. Basilic Vein: No evidence of thrombus. Normal compressibility, respiratory phasicity and response to augmentation. Brachial Veins: No evidence of thrombus. Normal compressibility, respiratory phasicity and response to augmentation. Radial Veins: No evidence of thrombus. Normal compressibility, respiratory phasicity and response to augmentation.  Ulnar Veins: No evidence of thrombus. Normal compressibility, respiratory phasicity and response to augmentation. Other Findings:  None visualized. IMPRESSION: Directed duplex of the right upper extremity negative for DVT Signed, Dulcy Fanny. Nadene Rubins, RPVI [facility] Electronically Signed   By: Corrie Mckusick D.O.   On: 09/22/2022 16:35

## 2022-12-20 NOTE — Assessment & Plan Note (Addendum)
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  Overall he tolerates well.

## 2022-12-20 NOTE — Telephone Encounter (Signed)
Notified patient as instructed, patient understands

## 2022-12-20 NOTE — Assessment & Plan Note (Signed)
Continue Percocet as needed as instructed.  Refills sent.

## 2022-12-20 NOTE — Progress Notes (Signed)
Patient here for follow up. Pt reports that he has a list of concerns, but he will discuss with MD.

## 2022-12-20 NOTE — Assessment & Plan Note (Addendum)
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  I will hold off Daratumumab and let him start on Docetaxel treatments.  Expect that his myeloma will progress in the future. Currently, will prioritize his treatment for prostate cancer  Continue Acyclovir '200mg'$  BID  -Bone health, currently he does not have an active myeloma bone involvement.  Patient reports history of developing adverse reaction [vision loss] after Prolia treatments. -s/p  root canal. ophthalmologist clearance obtained for starting Xgeva. . -will start along with his next visit.

## 2022-12-20 NOTE — Assessment & Plan Note (Signed)
Stable hemoglobin. monitor 

## 2022-12-20 NOTE — Assessment & Plan Note (Signed)
High-grade urothelial carcinoma, Cystoscopy at [facility] showed ureter high-grade carcinoma as well as bladder high-grade papillary carcinoma.S/p palliative radiation.  10/31/2022 repeat cystoscopy did not reveal any recurrence. - He has ureter stent, Recommend patient continue follow up with Urology for repeat cystoscopy..  Possible need of starting keytruda and Padcev in the near future if recurrence develops.  

## 2022-12-20 NOTE — Assessment & Plan Note (Signed)
Avoid nephrotoxins.  Encourage oral hydration  

## 2022-12-21 ENCOUNTER — Inpatient Hospital Stay: Payer: Medicare Other | Admitting: Licensed Clinical Social Worker

## 2022-12-21 DIAGNOSIS — C9000 Multiple myeloma not having achieved remission: Secondary | ICD-10-CM

## 2022-12-21 NOTE — Progress Notes (Signed)
[place] CSW Progress Note  Clinical Education officer, museum  returned phone message left for CSW [employer].  Pt states he received a packet for Advance Directive and MOST form and would like to complete the forms to be placed on chart.  Per pt he had previously completed a HCP and Living Will with his lawyer which was left to be scanned into his chart w/ the cancer center.  CSW informed pt CSW Maurice Small will not return to the office until after next week; however, the MOST could be completed w/ the Palliative Care NP prior to her return.  Pt will discuss at his appointment w/ Palliative Care NP next week.  Message left for CSW Teresita Maxey to follow up upon return to office.        Henriette Combs, LCSW

## 2022-12-29 ENCOUNTER — Encounter: Payer: Self-pay | Admitting: Urology

## 2022-12-29 ENCOUNTER — Inpatient Hospital Stay (HOSPITAL_BASED_OUTPATIENT_CLINIC_OR_DEPARTMENT_OTHER): Payer: Medicare Other | Admitting: Hospice and Palliative Medicine

## 2022-12-29 ENCOUNTER — Ambulatory Visit (INDEPENDENT_AMBULATORY_CARE_PROVIDER_SITE_OTHER): Payer: Medicare Other | Admitting: Urology

## 2022-12-29 VITALS — BP 130/53 | HR 62 | Ht 67.0 in | Wt 142.0 lb

## 2022-12-29 DIAGNOSIS — N4889 Other specified disorders of penis: Secondary | ICD-10-CM

## 2022-12-29 DIAGNOSIS — Z515 Encounter for palliative care: Secondary | ICD-10-CM | POA: Diagnosis not present

## 2022-12-29 DIAGNOSIS — R102 Pelvic and perineal pain: Secondary | ICD-10-CM

## 2022-12-29 DIAGNOSIS — R3915 Urgency of urination: Secondary | ICD-10-CM | POA: Diagnosis not present

## 2022-12-29 DIAGNOSIS — C9000 Multiple myeloma not having achieved remission: Secondary | ICD-10-CM

## 2022-12-29 DIAGNOSIS — N1330 Unspecified hydronephrosis: Secondary | ICD-10-CM | POA: Diagnosis not present

## 2022-12-29 DIAGNOSIS — C61 Malignant neoplasm of prostate: Secondary | ICD-10-CM

## 2022-12-29 DIAGNOSIS — C679 Malignant neoplasm of bladder, unspecified: Secondary | ICD-10-CM

## 2022-12-29 LAB — URINALYSIS, COMPLETE
Bilirubin, UA: NEGATIVE
Glucose, UA: NEGATIVE
Ketones, UA: NEGATIVE
Nitrite, UA: NEGATIVE
Specific Gravity, UA: 1.02 (ref 1.005–1.030)
Urobilinogen, Ur: 0.2 mg/dL (ref 0.2–1.0)
pH, UA: 5.5 (ref 5.0–7.5)

## 2022-12-29 LAB — MICROSCOPIC EXAMINATION

## 2022-12-29 MED ORDER — URIBEL 118 MG PO CAPS: 118.0000 mg | ORAL_CAPSULE | Freq: Three times a day (TID) | ORAL | 0 refills | Status: DC | PRN

## 2022-12-29 MED ORDER — TROSPIUM CHLORIDE 20 MG PO TABS: 20.0000 mg | ORAL_TABLET | Freq: Two times a day (BID) | ORAL | 0 refills | Status: DC

## 2022-12-29 NOTE — Progress Notes (Signed)
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   HPI: 82 y.o. male called for an appointment to discuss pelvic pain and storage related voiding symptoms  I last saw him April 2023 for right hydronephrosis and possible bladder mass.  He underwent cystoscopy under anesthesia 03/14/2022 with findings of a mass in the right distal ureter with obstruction.  Cytology/biopsies + high-grade urothelial carcinoma and stent was placed Subsequently referred to [facility].  He had progressive invasion of the bladder and was not felt to be a operative candidate.  Treated with palliative radiation.  Stent was last exchanged 10/31/2022 History unfavorable intermediate risk prostate cancer treated with IMRT + ADT in 2020.  PSA October 2022 was 0.08 and between October 2023 in January 2024 PSA has risen from 3.13-83.52.  PSMA/PET with metastasis in the pelvis, peritoneal implants, liver, pleura  He states approximately 2 weeks after his last stent exchange he had onset of severe urinary urgency and pelvic pain which radiates to the penis.  He is  minimally symptomatic when supine however after standing will have onset of pain and urinary incontinence.  Postop KUB was reviewed and his stent is in good position.  He was placed on tamsulosin at [facility] and recently saw Thomas Hoff and Logan Bores was added Has intermittent hematuria and passes "flesh colored material" at times   PMH: Past Medical History:  Diagnosis Date   Age related osteoporosis    Anemia    Barrett's esophagus    Bradycardia    Cancer (HCC)    PROSTATE   Cataract    CKD (chronic kidney disease) stage 3, GFR 30-59 ml/min (HCC)    Colon polyps    DDD (degenerative disc disease), cervical    DDD (degenerative disc disease), lumbar    Femur fracture (HCC)    Right   Fundic gland polyposis of stomach    Fundic gland polyps of stomach, benign    Gastritis    GERD (gastroesophageal reflux disease)    Hematuria    Hemorrhage of rectum and anus    History of colon polyps    Insomnia    Lumbago    Osteopenia of the elderly    Skin cancer    Sleep apnea     Surgical History: Past Surgical History:  Procedure Laterality Date   BAND HEMORRHOIDECTOMY     COLONOSCOPY     COLONOSCOPY WITH PROPOFOL N/A 05/06/2018   Procedure: COLONOSCOPY WITH PROPOFOL;  Surgeon: Lollie Sails, MD;  Location: [facility] ENDOSCOPY;  Service: Endoscopy;  Laterality: N/A;   COLONOSCOPY WITH PROPOFOL N/A 09/23/2018   Procedure: COLONOSCOPY WITH PROPOFOL;  Surgeon: Lollie Sails, MD;  Location: [facility] ENDOSCOPY;  Service: Endoscopy;  Laterality: N/A;   COLONOSCOPY WITH PROPOFOL  N/A 03/30/2021   Procedure: COLONOSCOPY WITH PROPOFOL;  Surgeon: Toledo, Benay Pike, MD;  Location: [facility] ENDOSCOPY;  Service: Gastroenterology;  Laterality: N/A;   CYSTOSCOPY W/ URETERAL STENT PLACEMENT Right 03/14/2022   Procedure: CYSTOSCOPY WITH RETROGRADE PYELOGRAM/URETERAL STENT PLACEMENT;  Surgeon: Abbie Sons, MD;  Location: [facility] ORS;  Service: Urology;  Laterality: Right;   ESOPHAGOGASTRODUODENOSCOPY  (EGD) WITH PROPOFOL N/A 10/03/2016   Procedure: ESOPHAGOGASTRODUODENOSCOPY (EGD) WITH PROPOFOL;  Surgeon: Lollie Sails, MD;  Location: [facility] ENDOSCOPY;  Service: Endoscopy;  Laterality: N/A;   EYE SURGERY     CATARACTS   EYELID SURGERY     FLEXIBLE SIGMOIDOSCOPY     FRACTURE SURGERY Right 2016   f[place]   FRACTURE SURGERY     ORIF DISTAL FEMUR FRACTURE     TONSILLECTOMY     TRANSURETHRAL RESECTION OF BLADDER TUMOR N/A 03/14/2022   Procedure: TRANSURETHRAL RESECTION OF BLADDER TUMOR (TURBT);  Surgeon: Abbie Sons, MD;  Location: [facility] ORS;  Service: Urology;  Laterality: N/A;    Home Medications:  Allergies as of 12/29/2022       Reactions   Ciprofloxacin Other (See Comments)   Yeast infection   Demerol [meperidine Hcl] Other (See Comments)   "I pass out"   Gabapentin Hives   Prolia [denosumab] Other (See Comments)   Pain and vision loss. Pt requesting to add as an allergy.    Demerol [meperidine] Nausea And Vomiting, Other (See Comments)   Patient passed out.   Nsaids Other (See Comments)   Not supposed to take NSAIDS per Dr Gustavo Lah due to [facility] Esophagus history Per Nephrologist, not to take NSAIDS        Medication List        Accurate as of December 29, 2022  1:37 PM. If you have any questions, ask your nurse or doctor.          STOP taking these medications    aspirin EC 81 MG tablet Stopped by: Abbie Sons, MD   ondansetron 8 MG tablet Commonly known as: Zofran Stopped by: Abbie Sons, MD   prochlorperazine 10 MG tablet Commonly known as: COMPAZINE Stopped by: Abbie Sons, MD       TAKE these medications    acyclovir 200 MG capsule Commonly known as: ZOVIRAX Take 1 capsule (200 mg total) by mouth 2 (two) times daily.   CALCIUM 500 PO Take 1 tablet by mouth daily.   dexamethasone 4 MG tablet Commonly known as: DECADRON Take 4 mg by mouth 2 (two) times daily with a meal. Sig: Take 2 tabs by mouth 2 times daily starting day  before chemo. Then take 2 tabs daily for 2 days starting day after chemo. Take with food.   ferrous sulfate 325 (65 FE) MG tablet Take 325 mg by mouth daily.   Gemtesa 75 MG Tabs Generic drug: Vibegron Take 75 mg by mouth daily.   HYDROcodone-acetaminophen 5-325 MG tablet Commonly known as: Norco Take 1 tablet by mouth every 6 (six) hours as needed for moderate pain.   ipratropium 0.03 % nasal spray Commonly known as: ATROVENT Place 1 spray into both nostrils in the morning.   losartan 25 MG tablet Commonly known as: COZAAR Take 25 mg by mouth daily.   melatonin 5 MG Tabs Take 5 mg by mouth at bedtime.   Mens 50+ Multivitamin Tabs Take 1 tablet by mouth daily.   nystatin cream Commonly known as: MYCOSTATIN Apply 1 Application topically 2 (two) times daily.  senna 8.6 MG Tabs tablet Commonly known as: SENOKOT Take 2 tablets (17.2 mg total) by mouth daily.   tamsulosin 0.4 MG Caps capsule Commonly known as: FLOMAX Take 1 capsule by mouth daily.   traZODone 50 MG tablet Commonly known as: DESYREL Take 25 mg by mouth at bedtime.   trospium 20 MG tablet Commonly known as: SANCTURA Take 1 tablet (20 mg total) by mouth 2 (two) times daily. Started by: Abbie Sons, MD   Uribel 118 MG Caps Take 1 capsule (118 mg total) by mouth 3 (three) times daily as needed. Started by: Abbie Sons, MD   Vitamin D3 125 MCG (5000 UT) Caps Take 5,000 Units by mouth daily.        Allergies:  Allergies  Allergen Reactions   Ciprofloxacin Other (See Comments)    Yeast infection   Demerol [Meperidine Hcl] Other (See Comments)    "I pass out"   Gabapentin Hives   Prolia [Denosumab] Other (See Comments)    Pain and vision loss. Pt requesting to add as an allergy.    Demerol [Meperidine] Nausea And Vomiting and Other (See Comments)    Patient passed out.   Nsaids Other (See Comments)    Not supposed to take NSAIDS per Dr Gustavo Lah due to Barrett's Esophagus history Per  Nephrologist, not to take NSAIDS    Family History: Family History  Problem Relation Age of Onset   Breast cancer Mother        dx 50s-50s   Prostate cancer Paternal Grandfather 84   Breast cancer Cousin     Social History:  reports that he quit smoking about 48 years ago. His smoking use included cigarettes. He has a 15.00 pack-year smoking history. He has never used smokeless tobacco. He reports that he does not currently use alcohol after a past usage of about 1.0 standard drink of alcohol per week. He reports that he does not use drugs.   Physical Exam: BP (!) 130/53   Pulse 62   Ht '5\' 7"'$  (1.702 m)   Wt 142 lb (64.4 kg)   BMI 22.24 kg/m   Constitutional:  Alert and oriented, No acute distress. HEENT: [place] AT Cardiovascular: No clubbing, cyanosis, or edema. Psychiatric: Normal mood and affect.  Laboratory Data:  Urinalysis Dipstick 2+ blood/trace leukocytes/2+ protein Microscopy 6-10 WBC/11-30 RBC    Assessment & Plan:   Pelvic symptoms most likely a combination of indwelling ureteral stent and prior pelvic radiation Add trospium 20 mg twice daily and Uribel as needed Continue Gemtesa and tamsulosin 1 month follow-up symptom recheck Unlikely change in his stent will make a significant improvement in his symptoms.  Could consider percutaneous nephrostomy placement and stent removal.  Will discuss on follow-up   Abbie Sons, MD  [place] [address], [place], [place] [address] [phone]

## 2022-12-29 NOTE — Progress Notes (Signed)
Virtual Visit via Telephone Note  I connected with [name] [name] on 12/29/22 at  1:00 PM EST by telephone and verified that I am speaking with the correct person using two identifiers.  Location: Patient: Home Provider: Clinic   I discussed the limitations, risks, security and privacy concerns of performing an evaluation and management service by telephone and the availability of in person appointments. I also discussed with the patient that there may be a patient responsible charge related to this service. The patient expressed understanding and agreed to proceed.   History of Present Illness: [name] [name] is a 82 y.o. 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  Patient is status post palliative radiation for urothelial cancer he also underwent right ureteral stenting.  Patient has previously been on Revlimid and Velcade and maintenance Dara for myeloma.  However, recent treatments have been focused on his metastatic prostate cancer.  Palliative care was consulted to address goals.    Observations/Objective: I called and spoke with patient by phone.  He reports that he is doing reasonably well.  He denies significant changes or concerns.  He says that he saw urology this morning as he continues to endorse urinary urgency/frequency/bladder spasms.  He says that Dr. Bernardo Heater is keeping him on Gemtesa and Flomax but also added a few new medications with plan for 1 month follow-up.  Patient says that he is taking the Norco every 6 hours with good effect.  He denies any adverse effects or other concerns today.  Assessment and Plan: Neoplasm related pain - Continue Norco  Bladder spasms/urgency - patient is followed by urology  Follow Up Instructions: Follow-up telephone visit 1 to 2 months   I discussed the assessment and  treatment plan with the patient. The patient was provided an opportunity to ask questions and all were answered. The patient agreed with the plan and demonstrated an understanding of the instructions.   The patient was advised to call back or seek an in-person evaluation if the symptoms worsen or if the condition fails to improve as anticipated.  I provided 5 minutes of non-face-to-face time during this encounter.   [name], NP

## 2022-12-31 ENCOUNTER — Other Ambulatory Visit: Payer: Self-pay

## 2023-01-02 ENCOUNTER — Telehealth: Payer: Self-pay

## 2023-01-02 MED FILL — Dexamethasone Sodium Phosphate Inj 100 MG/10ML: INTRAMUSCULAR | Qty: 1 | Status: AC

## 2023-01-02 NOTE — Telephone Encounter (Signed)
Patient called stating he started having some tooth pain last night and went and seen a dentist. Dentist Ceasar Mons would like to try to take care of this problem by pulling the tooth. Patient states Dentist Ceasar Mons needs a verbal clearance from MD for proceedings.  Ceasar Mons office #: [phone]

## 2023-01-03 ENCOUNTER — Inpatient Hospital Stay: Payer: Medicare Other

## 2023-01-03 ENCOUNTER — Encounter: Payer: Self-pay | Admitting: Oncology

## 2023-01-03 ENCOUNTER — Telehealth: Payer: Self-pay | Admitting: *Deleted

## 2023-01-03 ENCOUNTER — Inpatient Hospital Stay (HOSPITAL_BASED_OUTPATIENT_CLINIC_OR_DEPARTMENT_OTHER): Payer: Medicare Other | Admitting: Oncology

## 2023-01-03 VITALS — BP 120/52 | HR 45 | Temp 96.0°F | Resp 18 | Wt 147.3 lb

## 2023-01-03 DIAGNOSIS — Z5112 Encounter for antineoplastic immunotherapy: Secondary | ICD-10-CM | POA: Diagnosis not present

## 2023-01-03 DIAGNOSIS — C9000 Multiple myeloma not having achieved remission: Secondary | ICD-10-CM

## 2023-01-03 DIAGNOSIS — C669 Malignant neoplasm of unspecified ureter: Secondary | ICD-10-CM

## 2023-01-03 DIAGNOSIS — D649 Anemia, unspecified: Secondary | ICD-10-CM

## 2023-01-03 DIAGNOSIS — C61 Malignant neoplasm of prostate: Secondary | ICD-10-CM

## 2023-01-03 DIAGNOSIS — C787 Secondary malignant neoplasm of liver and intrahepatic bile duct: Secondary | ICD-10-CM

## 2023-01-03 DIAGNOSIS — N184 Chronic kidney disease, stage 4 (severe): Secondary | ICD-10-CM

## 2023-01-03 DIAGNOSIS — K047 Periapical abscess without sinus: Secondary | ICD-10-CM

## 2023-01-03 DIAGNOSIS — G893 Neoplasm related pain (acute) (chronic): Secondary | ICD-10-CM

## 2023-01-03 DIAGNOSIS — R300 Dysuria: Secondary | ICD-10-CM

## 2023-01-03 DIAGNOSIS — Z5111 Encounter for antineoplastic chemotherapy: Secondary | ICD-10-CM

## 2023-01-03 LAB — COMPREHENSIVE METABOLIC PANEL
ALT: 13 U/L (ref 0–44)
AST: 24 U/L (ref 15–41)
Albumin: 3.3 g/dL — ABNORMAL LOW (ref 3.5–5.0)
Alkaline Phosphatase: 79 U/L (ref 38–126)
Anion gap: 10 (ref 5–15)
BUN: 47 mg/dL — ABNORMAL HIGH (ref 8–23)
CO2: 24 mmol/L (ref 22–32)
Calcium: 8.9 mg/dL (ref 8.9–10.3)
Chloride: 103 mmol/L (ref 98–111)
Creatinine, Ser: 2.44 mg/dL — ABNORMAL HIGH (ref 0.61–1.24)
GFR, Estimated: 26 mL/min — ABNORMAL LOW (ref >60)
Glucose, Bld: 138 mg/dL — ABNORMAL HIGH (ref 70–99)
Potassium: 4.1 mmol/L (ref 3.5–5.1)
Sodium: 137 mmol/L (ref 135–145)
Total Bilirubin: 0.5 mg/dL (ref 0.3–1.2)
Total Protein: 5.7 g/dL — ABNORMAL LOW (ref 6.5–8.1)

## 2023-01-03 LAB — CBC WITH DIFFERENTIAL/PLATELET
Abs Immature Granulocytes: 0.07 10*3/uL (ref 0.00–0.07)
Basophils Absolute: 0 10*3/uL (ref 0.0–0.1)
Basophils Relative: 0 %
Eosinophils Absolute: 0 10*3/uL (ref 0.0–0.5)
Eosinophils Relative: 0 %
HCT: 29.7 % — ABNORMAL LOW (ref 39.0–52.0)
Hemoglobin: 9.8 g/dL — ABNORMAL LOW (ref 13.0–17.0)
Immature Granulocytes: 1 %
Lymphocytes Relative: 4 %
Lymphs Abs: 0.5 10*3/uL — ABNORMAL LOW (ref 0.7–4.0)
MCH: 31.2 pg (ref 26.0–34.0)
MCHC: 33 g/dL (ref 30.0–36.0)
MCV: 94.6 fL (ref 80.0–100.0)
Monocytes Absolute: 1 10*3/uL (ref 0.1–1.0)
Monocytes Relative: 8 %
Neutro Abs: 10.1 10*3/uL — ABNORMAL HIGH (ref 1.7–7.7)
Neutrophils Relative %: 87 %
Platelets: 252 10*3/uL (ref 150–400)
RBC: 3.14 MIL/uL — ABNORMAL LOW (ref 4.22–5.81)
RDW: 14.6 % (ref 11.5–15.5)
WBC: 11.7 10*3/uL — ABNORMAL HIGH (ref 4.0–10.5)
nRBC: 0 % (ref 0.0–0.2)

## 2023-01-03 LAB — PSA: Prostatic Specific Antigen: 77.56 ng/mL — ABNORMAL HIGH (ref 0.00–4.00)

## 2023-01-03 MED ORDER — SODIUM CHLORIDE 0.9% FLUSH
10.0000 mL | INTRAVENOUS | Status: DC | PRN
  Administered 2023-01-03: 10 mL
  Filled 2023-01-03: qty 10

## 2023-01-03 MED ORDER — SODIUM CHLORIDE 0.9 % IV SOLN
75.0000 mg/m2 | Freq: Once | INTRAVENOUS | Status: AC
  Administered 2023-01-03: 130 mg via INTRAVENOUS
  Filled 2023-01-03: qty 13

## 2023-01-03 MED ORDER — SODIUM CHLORIDE 0.9 % IV SOLN
Freq: Once | INTRAVENOUS | Status: AC
  Filled 2023-01-03: qty 250

## 2023-01-03 MED ORDER — SODIUM CHLORIDE 0.9 % IV SOLN
10.0000 mg | Freq: Once | INTRAVENOUS | Status: AC
  Administered 2023-01-03: 10 mg via INTRAVENOUS
  Filled 2023-01-03: qty 10

## 2023-01-03 NOTE — Assessment & Plan Note (Signed)
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  I will hold off Daratumumab and let him start on Docetaxel treatments.  Expect that his myeloma will progress in the future. Currently, will prioritize his treatment for prostate cancer  Continue Acyclovir '200mg'$  BID  -Bone health, currently he does not have an active myeloma bone involvement.  Patient reports history of developing adverse reaction [vision loss] after Prolia treatments. -s/p  root canal. ophthalmologist clearance obtained for starting Xgeva. Hold Xgeva, he will need tooth extraction.

## 2023-01-03 NOTE — Assessment & Plan Note (Signed)
Avoid nephrotoxins.  Encourage oral hydration  

## 2023-01-03 NOTE — Assessment & Plan Note (Signed)
Continue Percocet as needed as instructed.

## 2023-01-03 NOTE — Assessment & Plan Note (Signed)
Intermittent chronic issue for him, improved.  Urology feels that his symptom is most likely secondary to inflammation from stent.   Continue follow up with Dr. Bernardo Heater .

## 2023-01-03 NOTE — Progress Notes (Signed)
Per MD ok to proceed with chemo with Crt 2.44

## 2023-01-03 NOTE — Progress Notes (Signed)
Hematology/Oncology Progress note [phone] [phone]    CHIEF COMPLAINTS/REASON FOR VISIT:  Multiple myeloma, high-grade urothelial carcinoma.metastatic prostate cancer.   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[place]) Staging form: Renal Pelvis and Ureter, AJCC 8th Edition - Clinical: Stage Unknown (cTX, cN0, cM0) - Signed by [name], MD on 03/17/2022   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 Proceed with Docetaxel '75mg'$ /m2 Q3 weeks with GCSF supports.   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  10/31/2022 repeat cystoscopy did not reveal any recurrence. - He has ureter stent, Recommend patient continue follow up with Urology for repeat cystoscopy.Marland Kitchen  Possible need of starting [place] and Padcev in the near future if recurrence  develops.   Anemia Hemoglobin has decreased. Multi factorial.  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    Dysuria Intermittent chronic issue for him, improved.  Urology feels that his symptom is most likely secondary to inflammation from stent.   Continue follow up with Dr. Bernardo Heater .  Encounter for antineoplastic chemotherapy Chemotherapy plan as listed above  Neoplasm related pain Continue Percocet as needed as instructed.    Multiple myeloma (HCC) #Stage III IgA kappa multiple myeloma, myeloma FISH panel positive for 13q deletion, gain of 1q, t (4;14), high risk, not candidate for autologous bone marrow transplant. 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  I will hold off Daratumumab and let him start on Docetaxel treatments.  Expect that his myeloma will progress in the future. Currently, will prioritize his treatment for prostate cancer  Continue Acyclovir '200mg'$  BID  -Bone health, currently he does not have an active myeloma bone involvement.  Patient reports history of developing adverse reaction [vision loss] after Prolia treatments. -s/p  root canal. ophthalmologist clearance obtained for starting Xgeva. Hold Xgeva, he will need tooth extraction.  Stage 4 chronic kidney disease (HCC) Avoid nephrotoxins.  Encourage oral hydration.   Follow-up in 3 weeks for next cycles of chemotherapy. All questions were answered. The patient knows to call the clinic with any problems, questions or concerns.  [name], MD, PhD [facility] Hematology Oncology 01/03/2023      HISTORY OF PRESENTING ILLNESS:  [name] [name] is a 82 y.o.  male presents for follow-up of multiple myeloma  and high-grade urothelial carcinoma, and metastatic castration resistant prostate cancer.   Oncology History  Multiple myeloma ([place])  03/02/2022 Imaging   PET showed No definitive signs of multiple myeloma by FDG PET.   Obstruction of the RIGHT ureter with soft tissue mass in the RIGHT pelvis showing increased metabolic activity suspicious first and foremost for urothelial neoplasm given location adjacent to RIGHT UVJ and affect upon the RIGHT ureter.   Little or no excretion of FDG on the RIGHT but still with uptake of FDG by renal parenchyma. Degree of hydronephrosis is not substantially changed but lack of FDG excretion on the RIGHT is compatible with secondary sign of physiologic significance of ureteral obstruction.   RIGHT pelvic sidewall uptake without visible lesion, could represent tiny lymph node not visible on noncontrast imaging.   Aortic atherosclerosis.  Pulmonary emphysema. Aortic Atherosclerosis and Emphysema    03/17/2022 Initial Diagnosis   Multiple myeloma ([place]) -Patient has progressively worsening kidney function.  He is scheduled for kidney biopsy. 01/26/2022, free kappa light chain 1058, lambda 15.3, light chain ratio 69. Protein electrophoresis showed restricted band-M spike migrating in the beta-1 globulin region. ANA negative.  ANCA negative. Random urine protein electrophoresis showed abnormal protein band detected in the gamma globulin And a second possible abnormal protein band that may represent monoclonal protein.   02/23/2022 multiple myeloma showed IgA 2883, M protein of 1.8, beta 2 microglobulin 6.9, kappa free light chain 1268.9, lambda 13.2, free light chain ratio 96.13.   CMP showed creatinine of 3.41, that is significantly worse than his baseline in June 2022. 02/28/2022, 24-hour urine protein electrophoresis showed M protein of 729 mg, IgA monoclonal kappa light chain. 03/01/2022, [facility] kidney biopsy showed  diffuse light chain tubulopathy, and light chain cast nephropathy, moderate interstitial fibrosis,4% global glomerular sclerosis and the marked atherosclerosis.  03/06/2022, patient underwent a bone marrow biopsy. Pathology showed hypercellular bone marrow with plasma cell neoplasm, representing 20% of all cells in the aspirate associated with prominent interstitial infiltrates and numerous predominantly small clusters in the clots in the biopsy sections.  The plasma cells display kappa light chain restriction consistent with plasma cell neoplasm. Normal cytogenetics, myeloma FISH panel positive for 13q deletion, gain of 1q, t (4;14)   03/17/2022 Cancer Staging   Staging form: Plasma Cell Myeloma and Plasma Cell Disorders, AJCC 8th Edition - Clinical stage from 03/17/2022: RISS Stage III (Beta-2-microglobulin (mg/L): 6.9, Albumin (g/dL): 3.3, ISS: Stage III, High-risk cytogenetics: Present, LDH: Normal) - Signed by [name], MD on 03/17/2022 Stage prefix: Initial diagnosis Beta 2 microglobulin range (mg/L): Greater than or equal to 5.5 Albumin range (g/dL): Less than 3.5 Cytogenetics: t(4;14) translocation, Other mutation, 1q addition   03/24/2022 - 07/19/2022 Chemotherapy   Patient is on Treatment Plan : MYELOMA NEWLY DIAGNOSED Daratumumab + Dexamethasone Weekly (DaraRd) q28d     09/21/2022 Imaging   MRI lumbar spine wo contrast 1. 6 mm nodule in the cauda equina at the L4-5 level. 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 Recommend MRI of the lumbar spine with contrast for further evaluation. 2. 12 mm T2 hyperintense lesion along the inferior aspect of the S1 vertebral body. This may represent focal involvement of multiple myeloma. No other discrete lesions are present. No pathologic fracture is present. 3. Mild bilateral foraminal stenosis at L3-4 and L4-5. 4. Left paramedian annular tear at L5-S1 near the left S1 nerve roots.   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   Obstruction of the RIGHT ureter with soft tissue mass in the RIGHT pelvis showing increased metabolic activity suspicious first and foremost for urothelial neoplasm given location adjacent to RIGHT UVJ and affect upon the RIGHT ureter.   Little or no excretion of FDG on the RIGHT but still with uptake of FDG by renal parenchyma. Degree of hydronephrosis is not substantially changed but lack of FDG excretion on the RIGHT is compatible with secondary sign of physiologic significance of ureteral obstruction.   RIGHT pelvic sidewall uptake without visible lesion, could represent tiny lymph node not visible on noncontrast imaging.   Aortic atherosclerosis.  Pulmonary emphysema. 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  Right ureteroscopy showed nodular tumor distal ureter and a convincing area without tumor suspicious for extrinsic obstruction.s/p   right ureteral stent placement. Ureteral tumor showed a tiny fragments of fibrous tissue, interpretation limited by thermal and crush artifact.  Right distal ureter saline barbotage showed hight grade urothelial carcinoma.    03/17/2022 Cancer Staging   Staging form: Renal Pelvis and Ureter, AJCC 8th Edition - Clinical: Stage Unknown (cTX, cN0, cM0) - Signed by [name], MD on 03/17/2022 Stage prefix: Initial diagnosis WHO/ISUP grade (low/high): High Grade Histologic grading system: 2 grade system   06/05/2022 Imaging   PET scan at  [facility] showed 1.Interval placement of right-sided double-J ureteral stent with resolution of hydronephrosis and improved excretion of radiotracer.  2.There is persistent abnormal hypermetabolic tissue extending posterior lateral from the right aspect of the bladder encasing the distal ureter/stent which measures approximately 5.1 x 4.2 (CT image 243).  3.There are similar linear areas of abnormal hypermetabolic tissue seen to extend more superiorly along the right posterior pelvic wall/peritoneum, and towards the sciatic notch (fused image 238). Linear configuration suggests possible perineural spread. Note that this soft tissue abuts and may encase pelvic vasculature including the external and internal iliac arteries.  4.NEW single subcentimeter but abnormal focus of FDG avidity in the right presacral region (CT image 231) measuring 1.6 cm representing new site of neoplastic disease   06/19/2022 - 08/10/2022 Radiation Therapy   Pelvis RT 06/19/22-08/21/22 Bladder RT 07/27/22- 08/20/22    09/18/2022 Imaging   PET scan at [facility] showed 1. Evidence of disease progression with multifocal new hypermetabolic lesions around the periphery of the liver as described above. The evaluation of these lesions is limited by significant misregistration between the PET and CT portions of the examination. Contrast-enhanced CT or MRI could be used for confirmation and/or guidance of biopsy as necessary. There is also a lung parenchymal nodule with new associated FDG uptake, also suspicious for a site of metastasis.   2. Although the right nephroureteral stent appears to be well positioned, the right kidney demonstrates slightly increased hydronephrosis with significant surrounding fat stranding and relatively delayed clearance of FDG. These findings could suggest stent malfunction or be related to upper urinary tract infection. Suggest clinical correlation with any patient signs or symptoms such as right flank pain, fever, and/or  urinalysis  findings.     09/21/2022 Imaging   MRI lumbar spine wo contrast 1. 6 mm nodule in the cauda equina at the L4-5 level. 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 Recommend MRI of the lumbar spine with contrast for further evaluation. 2. 12 mm T2 hyperintense lesion along the inferior aspect of the S1 vertebral body. This may represent focal involvement of multiple myeloma. No other discrete lesions are present. No pathologic fracture is present. 3. Mild bilateral foraminal stenosis at L3-4 and L4-5. 4. Left paramedian annular tear at L5-S1 near the left S1 nerve roots.    Genetic Testing   Negative genetic testing. No pathogenic variants identified on the [facility] Multi-Cancer+RNA panel. The report date is 11/19/2022.  The Multi-Cancer + RNA Panel offered by [facility] includes sequencing and/or deletion/duplication analysis of the following 70 genes:  AIP*, ALK, APC*, ATM*, AXIN2*, BAP1*, BARD1*, BLM*, BMPR1A*, BRCA1*, BRCA2*, BRIP1*, CDC73*, CDH1*, CDK4, CDKN1B*, CDKN2A, CHEK2*, CTNNA1*, DICER1*, EPCAM, EGFR, FH*, FLCN*, GREM1, HOXB13, KIT, LZTR1, MAX*, MBD4, MEN1*, MET, MITF, MLH1*, MSH2*, MSH3*, MSH6*, MUTYH*, NF1*, NF2*, NTHL1*, PALB2*, PDGFRA, PMS2*, POLD1*, POLE*, POT1*, PRKAR1A*, PTCH1*, PTEN*, RAD51C*, RAD51D*, RB1*, RET, SDHA*, SDHAF2*, SDHB*, SDHC*, SDHD*, SMAD4*, SMARCA4*, SMARCB1*, SMARCE1*, STK11*, SUFU*, TMEM127*, TP53*, TSC1*, TSC2*, VHL*. RNA analysis is performed for * genes.   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 Evidence of local  prostate carcinoma within the pelvis with peritoneal implants along the bowel serosal surface as well as a metastatic LEFT iliac lymph node. 2. 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 3. Multifocal pleural metastasis in the RIGHT hemithorax. 4. No clear evidence of skeletal metastasis      Genetic Testing   Negative genetic testing. No pathogenic variants identified on the [facility] Multi-Cancer+RNA panel. The report date is 11/19/2022.  The Multi-Cancer + RNA Panel offered by [facility] includes sequencing and/or deletion/duplication analysis of the following 70 genes:  AIP*, ALK, APC*, ATM*, AXIN2*, BAP1*, BARD1*, BLM*, BMPR1A*, BRCA1*, BRCA2*, BRIP1*, CDC73*, CDH1*, CDK4, CDKN1B*, CDKN2A, CHEK2*, CTNNA1*, DICER1*, EPCAM, EGFR, FH*, FLCN*, GREM1, HOXB13, KIT, LZTR1, MAX*, MBD4, MEN1*, MET, MITF, MLH1*, MSH2*, MSH3*, MSH6*, MUTYH*, NF1*, NF2*, NTHL1*, PALB2*, PDGFRA, PMS2*, POLD1*, POLE*, POT1*, PRKAR1A*, PTCH1*, PTEN*, RAD51C*, RAD51D*, RB1*, RET, SDHA*, SDHAF2*, SDHB*, SDHC*, SDHD*, SMAD4*, SMARCA4*, SMARCB1*, SMARCE1*, STK11*, SUFU*, TMEM127*, TP53*, TSC1*, TSC2*, VHL*. RNA analysis is performed for * genes.    11/27/22 UA showed RBC >50, moderate leukocytes, Urine culture negative.  Cipro did not provide him any relieve. Was seen by symptomatic management clinic and was prescribed a course steroid and Keflex.  11/28/22 CXR showed bronchitis - URI symptom has improved.  11/29/22 US renal Mild right hydronephrosis and proximal hydroureter. Low level echoes within the visualized proximal right ureter and renal pelvis,which may reflect blood products or debris.2. Stent is partially visualized within the urinary bladder. Right ureteral jet was not definitively seen.  He was seen by [facility] urology and his symptoms were felt to be due to post treatment inflammation. He was recommended to take Flomax and NSAIDS.  Due to his  CKD, we recommend him not to take NSAIDS.   Today he is here for chemotherapy treatments.  He continues to have increased urine frequently, burning with urination, and doesn't feel like he is emptying well. Also swelling of penis.  We arranged him to be seen by [facility] urology team today and his symptoms was felt to be due to stent discomfort versus underlying malignancy and constipation may also contribute to his symtoms.  Marland Kitchen He was recommended to start [place]. Miralax for constipation.  Balanoposthitis, he will take one dose of diflucan.   INTERVAL HISTORY Muhamed Luecke Banfield is a 82 y.o. male who has above history reviewed by me today presents for follow up visit for multiple myeloma and high-grade urothelial carcinoma, metastatic prostate cancer.  +dysuria/urinary urgency symptoms are better.  Overall he tolerated cycle 1 Docetaxel with GCSF support.  + tooth pain/infection, his dentist started him on Amoxicillin yesterday, pain is better.  Denies fever, chills, nausea or vomiting.    Review of Systems  Constitutional:  Positive for fatigue. Negative for appetite change, chills, fever and unexpected weight change.  HENT:   Negative for hearing loss and voice change.   Eyes:  Negative for eye problems and icterus.  Respiratory:  Negative for chest tightness, cough and shortness of breath.   Cardiovascular:  Negative for chest pain and leg swelling.  Gastrointestinal:  Positive for constipation. Negative for abdominal distention, abdominal pain and diarrhea.  Endocrine: Negative for hot flashes.  Genitourinary:  Positive for dysuria and frequency. Negative for difficulty urinating.   Musculoskeletal:  Negative for arthralgias.  Skin:  Negative for itching.  Neurological:  Negative for light-headedness and numbness.  Hematological:  Negative for adenopathy. Does not bruise/bleed easily.  Psychiatric/Behavioral:  Negative for confusion.      MEDICAL HISTORY:  Past Medical History:  Diagnosis Date   Age related osteoporosis    Anemia     Barrett's esophagus    Bradycardia    Cancer (HCC)    PROSTATE   Cataract    CKD (chronic kidney disease) stage 3, GFR 30-59 ml/min (HCC)    Colon polyps    DDD (degenerative disc disease), cervical    DDD (degenerative disc disease), lumbar    Femur fracture (HCC)    Right   Fundic gland polyposis of stomach    Fundic gland polyps of stomach, benign    Gastritis    GERD (gastroesophageal reflux disease)    Hematuria    Hemorrhage of rectum and anus    History of colon polyps    Insomnia    Lumbago    Osteopenia of the elderly    Skin cancer    Sleep apnea     SURGICAL HISTORY: Past Surgical History:  Procedure Laterality Date   BAND HEMORRHOIDECTOMY     COLONOSCOPY     COLONOSCOPY WITH PROPOFOL N/A 05/06/2018   Procedure: COLONOSCOPY WITH PROPOFOL;  Surgeon: Lollie Sails, MD;  Location: [facility] ENDOSCOPY;  Service: Endoscopy;  Laterality: N/A;   COLONOSCOPY WITH PROPOFOL N/A 09/23/2018   Procedure: COLONOSCOPY WITH PROPOFOL;  Surgeon: Lollie Sails, MD;  Location[facility] ENDOSCOPY;  Service: Endoscopy;  Laterality: N/A;   COLONOSCOPY WITH PROPOFOL N/A 03/30/2021   Procedure: COLONOSCOPY WITH PROPOFOL;  Surgeon: Toledo, Benay Pike, MD;  Location: [facility] ENDOSCOPY;  Service: Gastroenterology;  Laterality: N/A;   CYSTOSCOPY W/ URETERAL STENT PLACEMENT Right 03/14/2022   Procedure: CYSTOSCOPY WITH RETROGRADE PYELOGRAM/URETERAL STENT PLACEMENT;  Surgeon: Abbie Sons, MD;  Location: [facility] ORS;  Service: Urology;  Laterality: Right;   ESOPHAGOGASTRODUODENOSCOPY (EGD) WITH PROPOFOL N/A 10/03/2016   Procedure: ESOPHAGOGASTRODUODENOSCOPY (EGD) WITH PROPOFOL;  Surgeon: Lollie Sails, MD;  Location: [facility] ENDOSCOPY;  Service: Endoscopy;  Laterality: N/A;   EYE SURGERY     CATARACTS   EYELID SURGERY     FLEXIBLE SIGMOIDOSCOPY     FRACTURE SURGERY Right 2016   femur   FRACTURE SURGERY     ORIF DISTAL FEMUR FRACTURE     TONSILLECTOMY     TRANSURETHRAL RESECTION OF  BLADDER TUMOR N/A 03/14/2022   Procedure: TRANSURETHRAL RESECTION OF BLADDER TUMOR (TURBT);  Surgeon: Abbie Sons, MD;  Location: [facility] ORS;  Service: Urology;  Laterality: N/A;    SOCIAL HISTORY: Social History   Socioeconomic History   Marital status: Married    Spouse name: Not on file   Number of children: Not on file   Years of education: Not on file   Highest education level: Not on file  Occupational History   Not on file  Tobacco Use   Smoking status: Former    Packs/day: 1.00    Years: 15.00    Total pack years: 15.00    Types: Cigarettes    Quit date: 10/03/1974    Years since quitting: 48.2   Smokeless tobacco: Never  Vaping Use   Vaping Use: Never used  Substance and Sexual Activity   Alcohol use: Not Currently    Alcohol/week: 1.0 standard drink of alcohol    Types: 1 Cans of beer per week    Comment: 1 beer every 2 weeks   Drug use: No   Sexual activity: Yes    Birth control/protection: None  Other Topics Concern   Not on file  Social History Narrative   ** Merged History Encounter **       Social Determinants of Health   Financial Resource Strain: Not on file  Food Insecurity: Not on file  Transportation Needs: Not on file  Physical Activity: Not on file  Stress: Not on file  Social Connections: Not on file  Intimate Partner Violence: Not on file    FAMILY HISTORY: Family History  Problem Relation Age of Onset   Breast cancer Mother        dx 16s-50s   Prostate cancer Paternal Grandfather 52   Breast cancer Cousin     ALLERGIES:  is allergic to ciprofloxacin, demerol [meperidine hcl], gabapentin, prolia [denosumab], demerol [meperidine], and nsaids.  MEDICATIONS:  Current Outpatient Medications  Medication Sig Dispense Refill   acyclovir (ZOVIRAX) 200 MG capsule Take 1 capsule (200 mg total) by mouth 2 (two) times daily. 60 capsule 3   amoxicillin (AMOXIL) 500 MG capsule Take 500 mg by mouth 2 (two) times daily.     Calcium  Carbonate (CALCIUM 500 PO) Take 1 tablet by mouth daily.     Cholecalciferol (VITAMIN D3) 125 MCG (5000 UT) CAPS Take 5,000 Units by mouth daily.     dexamethasone (DECADRON) 4 MG tablet Take 4 mg by mouth 2 (two) times daily with a meal. Sig: Take 2 tabs by mouth 2 times daily starting day before chemo. Then take 2 tabs daily for 2 days starting day after chemo. Take with food.     ferrous sulfate 325 (65 FE) MG tablet Take 325 mg by mouth daily.     HYDROcodone-acetaminophen (NORCO) 5-325 MG tablet Take 1 tablet by mouth every 6 (six) hours as needed for moderate pain. 30 tablet 0   ipratropium (ATROVENT)  0.03 % nasal spray Place 1 spray into both nostrils in the morning.     losartan (COZAAR) 25 MG tablet Take 25 mg by mouth daily.     melatonin 5 MG TABS Take 5 mg by mouth at bedtime.     Meth-Hyo-M Bl-Na Phos-Ph Sal (URIBEL) 118 MG CAPS Take 1 capsule (118 mg total) by mouth 3 (three) times daily as needed. 30 capsule 0   Multiple Vitamins-Minerals (MENS 50+ MULTIVITAMIN) TABS Take 1 tablet by mouth daily.     nystatin cream (MYCOSTATIN) Apply 1 Application topically 2 (two) times daily. 30 g 0   senna (SENOKOT) 8.6 MG TABS tablet Take 2 tablets (17.2 mg total) by mouth daily. 120 tablet 0   tamsulosin (FLOMAX) 0.4 MG CAPS capsule Take 1 capsule by mouth daily.     traZODone (DESYREL) 50 MG tablet Take 25 mg by mouth at bedtime.     trospium (SANCTURA) 20 MG tablet Take 1 tablet (20 mg total) by mouth 2 (two) times daily. 60 tablet 0   Vibegron (GEMTESA) 75 MG TABS Take 75 mg by mouth daily. 28 tablet 0   No current facility-administered medications for this visit.   Facility-Administered Medications Ordered in Other Visits  Medication Dose Route Frequency Provider Last Rate Last Admin   sodium chloride flush (NS) 0.9 % injection 10 mL  10 mL Intracatheter PRN [name], MD   10 mL at 01/03/23 0952     PHYSICAL EXAMINATION: ECOG PERFORMANCE STATUS: 1 - Symptomatic but completely  ambulatory Today's Vitals   01/03/23 0906  BP: (!) 120/52  Pulse: (!) 45  Resp: 18  Temp: (!) 96 F (35.6 C)  Weight: 147 lb 4.8 oz (66.8 kg)  PainSc: 0-No pain   Body mass index is 23.07 kg/m.   Physical Exam Constitutional:      General: He is not in acute distress. HENT:     Head: Normocephalic and atraumatic.  Eyes:     General: No scleral icterus. Cardiovascular:     Rate and Rhythm: Normal rate.  Pulmonary:     Effort: Pulmonary effort is normal. No respiratory distress.     Breath sounds: No wheezing.  Abdominal:     General: There is no distension.     Palpations: Abdomen is soft.  Musculoskeletal:        General: No deformity. Normal range of motion.     Cervical back: Normal range of motion and neck supple.  Skin:    General: Skin is warm and dry.     Findings: No erythema.  Neurological:     Mental Status: He is alert and oriented to person, place, and time. Mental status is at baseline.     Cranial Nerves: No cranial nerve deficit.  Psychiatric:        Mood and Affect: Mood normal.      LABORATORY DATA:  I have reviewed the data as listed    Latest Ref Rng & Units 01/03/2023    8:40 AM 12/20/2022    9:05 AM 12/13/2022    8:34 AM  CBC  WBC 4.0 - 10.5 K/uL 11.7  8.6  14.1   Hemoglobin 13.0 - 17.0 g/dL 9.8  11.4  11.1   Hematocrit 39.0 - 52.0 % 29.7  34.5  33.1   Platelets 150 - 400 K/uL 252  112  202        Latest Ref Rng & Units 01/03/2023    8:40 AM 12/20/2022  9:05 AM 12/13/2022    8:34 AM  CMP  Glucose 70 - 99 mg/dL 138  111  133   BUN 8 - 23 mg/dL 47  44  47   Creatinine 0.61 - 1.24 mg/dL 2.44  2.53  2.15   Sodium 135 - 145 mmol/L 137  136  140   Potassium 3.5 - 5.1 mmol/L 4.1  4.5  4.3   Chloride 98 - 111 mmol/L 103  102  109   CO2 22 - 32 mmol/L '24  26  23   '$ Calcium 8.9 - 10.3 mg/dL 8.9  8.6  8.9   Total Protein 6.5 - 8.1 g/dL 5.7  5.8  5.5   Total Bilirubin 0.3 - 1.2 mg/dL 0.5  0.6  0.5   Alkaline Phos 38 - 126 U/L 79  75  74    AST 15 - 41 U/L '24  24  24   '$ ALT 0 - 44 U/L '13  21  19     '$ Iron/TIBC/Ferritin/ %Sat    Component Value Date/Time   IRON 60 02/23/2022 1523   TIBC 238 (L) 02/23/2022 1523   FERRITIN 86 02/23/2022 1523   IRONPCTSAT 25 02/23/2022 1523       RADIOGRAPHIC STUDIES: I have personally reviewed the radiological images as listed and agreed with the findings in the report.  DG Abd 1 View  Result Date: 12/14/2022 CLINICAL DATA:  Ureteral stent. EXAM: ABDOMEN - 1 VIEW COMPARISON:  Abdominal x-ray 11/29/2022 FINDINGS: The right ureteral stent is unchanged in position. Bowel-gas pattern is nonobstructive. There is a large amount of stool throughout the colon. Phleboliths are again noted in the pelvis, unchanged. Three right-sided hip screws are present. IMPRESSION: 1. Right ureteral stent is unchanged in position. 2. Large amount of stool throughout the colon. Electronically Signed   By: Ronney Asters M.D.   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 Additional history of bladder cancer. EXAM: NUCLEAR MEDICINE PET SKULL BASE TO THIGH TECHNIQUE: 9.7 mCi F18 Piflufolastat (Pylarify) was injected intravenously. Full-ring PET imaging was performed from the skull base to thigh after the radiotracer. CT data was obtained and used for attenuation correction and anatomic localization. COMPARISON:  FDG PET scan 05/02/2022 FINDINGS: NECK No radiotracer activity in neck lymph nodes. Incidental CT finding: None. CHEST Intense radiotracer activity at the junction of the first rib in the manubrium along the pleural surface with SUV max equal 15.1 (image 81). Intense activity within the RIGHT lower lobe along the pleural surface with SUV max equal 27.8 (image 132). There is mild pleural thickening at this level. Additional lesion along the pleural surface of the RIGHT lower lobe on  image 110 with SUV max equal 27. Small lymph node position between the esophagus and descending thoracic aorta adjacent to the spine with SUV max equal 11.5 on image 116. Potential small lymph node evident on the CT portion exam measuring 2-3 mm. Incidental CT finding: No discrete pulmonary nodules. ABDOMEN/PELVIS Prostate: No focal activity in prostatectomy bed. Intense metabolic activity within the deep pelvis adjacent to the rectum with SUV max equal 30.4 (image 223). No clear CT correlation on noncontrast exam. Potential serosal metastatic implant. Additional potential small bowel serosal implant on image 220 in the deep LEFT pelvis. Lymph nodes: Radiotracer avid LEFT external iliac lymph node measuring 12 mm image 211 SUV max equal 25.8. Liver:  Multifocal round lesion bilobed hepatic lesions with intense radiotracer activity. Example lesion in the superior LEFT hepatic lobe measuring 35 mm with SUV max equal 29.6 (image 130).\ Example lesion in the RIGHT hepatic lobe measuring 38 mm SUV max equal 36.4. Approximately 15 lesions in the liver. Incidental CT finding: Double-J ureteral stent on the RIGHT without obstruction. SKELETON No clear evidence skeletal metastasis. Lesions in the RIGHT pleural space adjacent ribs are favored pleural lesions rather than skeletal lesions. IMPRESSION: 1. Evidence of local prostate carcinoma within the pelvis with peritoneal implants along the bowel serosal surface as well as a metastatic LEFT iliac lymph node. 2. 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 3. Multifocal pleural metastasis in the RIGHT hemithorax. 4. No clear evidence of skeletal metastasis Electronically Signed   By: Suzy Bouchard M.D.   On: 12/05/2022 16:39   US RENAL  Result Date: 11/29/2022 CLINICAL DATA:  Dysuria, hematuria, urothelial carcinoma EXAM: RENAL / URINARY TRACT ULTRASOUND COMPLETE COMPARISON:  Ultrasound 02/02/2022, PET-CT 03/02/2022 FINDINGS: Right  Kidney: Renal measurements: 9.5 x 5.0 x 6.9 cm = volume: 173 mL. Echogenicity within normal limits. No mass or shadowing stone visualized. Mild hydronephrosis. The proximal right ureter is dilated. Low level echoes within the visualized proximal ureter and right renal pelvis. Left Kidney: Renal measurements: 10.2 x 7.1 x 5.5 cm = volume: 204 mL. Echogenicity within normal limits. Benign cysts within the lower pole of the left kidney which do not require follow-up imaging. No shadowing stone or hydronephrosis visualized. Bladder: Appears normal for degree of bladder distention. Stent is partially visualized within the urinary bladder. Right ureteral jet was not definitively seen. Other: None. IMPRESSION: 1. Mild right hydronephrosis and proximal hydroureter. Low level echoes within the visualized proximal right ureter and renal pelvis, which may reflect blood products or debris. 2. Stent is partially visualized within the urinary bladder. Right ureteral jet was not definitively seen. Electronically Signed   By: Davina Poke D.O.   On: 11/29/2022 14:54   DG Abd 1 View  Result Date: 11/29/2022 CLINICAL DATA:  Flank pain, dysuria EXAM: ABDOMEN - 1 VIEW COMPARISON:  PET-CT done on 03/02/2022 FINDINGS: Bowel gas pattern is nonspecific. Right ureteral stent is seen in place. Phleboliths are seen in pelvis. There is previous internal fixation in the right femur. IMPRESSION: Right ureteral stent is noted in place. Nonspecific bowel gas pattern. Electronically Signed   By: Elmer Picker M.D.   On: 11/29/2022 14:49   DG Chest 2 View  Result Date: 11/28/2022 CLINICAL DATA:  Cough.  Metastatic prostate cancer. EXAM: CHEST - 2 VIEW COMPARISON:  06/21/2022 FINDINGS: Heart size is normal. Mild aortic tortuosity. Bronchial thickening suggesting bronchitis. No consolidation, collapse or effusion. No acute bone finding. IMPRESSION: Bronchitis pattern. No consolidation or collapse. Electronically Signed   By: Nelson Chimes M.D.   On: 11/28/2022 12:19   US BIOPSY (LIVER)  Result Date: 10/06/2022 INDICATION: History of multiple myeloma, new liver lesions by PET-CT EXAM: ULTRASOUND CORE BIOPSY RIGHT INFERIOR LIVER LESION MEDICATIONS: 1% LIDOCAINE LOCAL ANESTHESIA/SEDATION: Moderate (conscious) sedation was employed during this procedure. A total of Versed 1.0 mg and Fentanyl 50 mcg was administered intravenously by the radiology nurse. Total intra-service moderate Sedation Time: 10 minutes. The patient's level of consciousness and vital signs were monitored continuously by radiology nursing throughout the procedure under my direct supervision. COMPLICATIONS: None immediate. PROCEDURE: Informed written consent was obtained from the patient after a thorough discussion of the procedural risks, benefits and alternatives.  All questions were addressed. Maximal Sterile Barrier Technique was utilized including caps, mask, sterile gowns, sterile gloves, sterile drape, hand hygiene and skin antiseptic. A timeout was performed prior to the initiation of the procedure. previous imaging reviewed. preliminary ultrasound performed. a right inferior hepatic lesion was localized and marked for biopsy. under sterile conditions and local anesthesia, the 17 gauge 6.8 cm access was advanced from a lower intercostal approach to the lesion. needle position confirmed with ultrasound. images obtained for documentation. 2 18 gauge cores obtained. samples placed in formalin. needle tract occluded with gel-foam. postprocedure imaging demonstrates no hemorrhage or hematoma. patient tolerated biopsy well. IMPRESSION: Successful CT-guided core biopsy of a right inferior hepatic lesion. Electronically Signed   By: Jerilynn Mages.  Shick M.D.   On: 10/06/2022 10:42

## 2023-01-03 NOTE — Assessment & Plan Note (Addendum)
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 Proceed with Docetaxel '75mg'$ /m2 Q3 weeks with GCSF supports.

## 2023-01-03 NOTE — Assessment & Plan Note (Signed)
Hemoglobin has decreased. Multi factorial.  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   

## 2023-01-03 NOTE — Assessment & Plan Note (Signed)
Chemotherapy plan as listed above 

## 2023-01-03 NOTE — Progress Notes (Signed)
Pt here for follow up. Pt reports that over the weekend he had severe constipation. Pt also reports that he started on amoxicillin yesterday for a tooth infection.

## 2023-01-03 NOTE — Assessment & Plan Note (Signed)
Improved after started on amoxicillin. Advise patient to finish the course.  Recommend cbc testing prior to tooth extraction.

## 2023-01-03 NOTE — Assessment & Plan Note (Signed)
High-grade urothelial carcinoma, Cystoscopy at [facility] showed ureter high-grade carcinoma as well as bladder high-grade papillary carcinoma.S/p palliative radiation.  10/31/2022 repeat cystoscopy did not reveal any recurrence. - He has ureter stent, Recommend patient continue follow up with Urology for repeat cystoscopy..  Possible need of starting keytruda and Padcev in the near future if recurrence develops.  

## 2023-01-03 NOTE — Patient Instructions (Signed)
[facility]  Discharge Instructions: Thank you for choosing [facility] to provide your oncology and hematology care.  If you have a lab appointment with the [facility], please go directly to the [facility] and check in at the registration area.  Wear comfortable clothing and clothing appropriate for easy access to any Portacath or PICC line.   We strive to give you quality time with your provider. You may need to reschedule your appointment if you arrive late (15 or more minutes).  Arriving late affects you and other patients whose appointments are after yours.  Also, if you miss three or more appointments without notifying the office, you may be dismissed from the clinic at the provider's discretion.      For prescription refill requests, have your pharmacy contact our office and allow 72 hours for refills to be completed.    Today you received the following chemotherapy and/or immunotherapy agents: DOCEtaxel    To help prevent nausea and vomiting after your treatment, we encourage you to take your nausea medication as directed.  BELOW ARE SYMPTOMS THAT SHOULD BE REPORTED IMMEDIATELY: *FEVER GREATER THAN 100.4 F (38 C) OR HIGHER *CHILLS OR SWEATING *NAUSEA AND VOMITING THAT IS NOT CONTROLLED WITH YOUR NAUSEA MEDICATION *UNUSUAL SHORTNESS OF BREATH *UNUSUAL BRUISING OR BLEEDING *URINARY PROBLEMS (pain or burning when urinating, or frequent urination) *BOWEL PROBLEMS (unusual diarrhea, constipation, pain near the anus) TENDERNESS IN MOUTH AND THROAT WITH OR WITHOUT PRESENCE OF ULCERS (sore throat, sores in mouth, or a toothache) UNUSUAL RASH, SWELLING OR PAIN  UNUSUAL VAGINAL DISCHARGE OR ITCHING   Items with * indicate a potential emergency and should be followed up as soon as possible or go to the Emergency Department if any problems should occur.  Please show the CHEMOTHERAPY ALERT CARD or IMMUNOTHERAPY ALERT CARD at check-in to  the Emergency Department and triage nurse.  Should you have questions after your visit or need to cancel or reschedule your appointment, please contact [facility]  [phone] and follow the prompts.  Office hours are 8:00 a.m. to 4:30 p.m. Monday - Friday. Please note that voicemails left after 4:00 p.m. may not be returned until the following business day.  We are closed weekends and major holidays. You have access to a nurse at all times for urgent questions. Please call the main number to the clinic [phone] and follow the prompts.  For any non-urgent questions, you may also contact your provider using MyChart. We now offer e-Visits for anyone 58 and older to request care online for non-urgent symptoms. For details visit [email or web address].   Also download the MyChart app! Go to the app store, search "MyChart", open the app, select [facility], and log in with your MyChart username and password.

## 2023-01-03 NOTE — Telephone Encounter (Signed)
Patient called reporting that he is trying to set up getting a tooth pulled and he now has a date of 01/18/23 to have it done. He is asking if Dr [name] [name] is ok with this date in conjunction with his chemotherapy treatments, Please advise

## 2023-01-03 NOTE — Telephone Encounter (Signed)
Dr. Tasia Catchings discussed with patient during today's visit.

## 2023-01-04 ENCOUNTER — Ambulatory Visit: Payer: Medicare Other

## 2023-01-04 ENCOUNTER — Ambulatory Visit: Payer: Medicare Other | Admitting: Oncology

## 2023-01-04 ENCOUNTER — Encounter: Payer: Self-pay | Admitting: Licensed Clinical Social Worker

## 2023-01-04 ENCOUNTER — Other Ambulatory Visit: Payer: Medicare Other

## 2023-01-04 LAB — CULTURE, URINE COMPREHENSIVE

## 2023-01-04 NOTE — Progress Notes (Signed)
[facility] CSW Progress Note  Clinical Education officer, museum contacted patient by phone to discuss questions about MOST form.  Patient stated he would contact CSW after 1:00PM, with his spouse.    Adelene Amas, LCSW

## 2023-01-05 ENCOUNTER — Inpatient Hospital Stay: Payer: Medicare Other | Attending: Oncology

## 2023-01-05 DIAGNOSIS — Z5189 Encounter for other specified aftercare: Secondary | ICD-10-CM | POA: Insufficient documentation

## 2023-01-05 DIAGNOSIS — J439 Emphysema, unspecified: Secondary | ICD-10-CM | POA: Diagnosis not present

## 2023-01-05 DIAGNOSIS — G893 Neoplasm related pain (acute) (chronic): Secondary | ICD-10-CM | POA: Diagnosis not present

## 2023-01-05 DIAGNOSIS — Z79899 Other long term (current) drug therapy: Secondary | ICD-10-CM | POA: Insufficient documentation

## 2023-01-05 DIAGNOSIS — C782 Secondary malignant neoplasm of pleura: Secondary | ICD-10-CM | POA: Diagnosis not present

## 2023-01-05 DIAGNOSIS — M5137 Other intervertebral disc degeneration, lumbosacral region: Secondary | ICD-10-CM | POA: Diagnosis not present

## 2023-01-05 DIAGNOSIS — I700 Atherosclerosis of aorta: Secondary | ICD-10-CM | POA: Diagnosis not present

## 2023-01-05 DIAGNOSIS — G4730 Sleep apnea, unspecified: Secondary | ICD-10-CM | POA: Diagnosis not present

## 2023-01-05 DIAGNOSIS — D631 Anemia in chronic kidney disease: Secondary | ICD-10-CM | POA: Diagnosis not present

## 2023-01-05 DIAGNOSIS — C9000 Multiple myeloma not having achieved remission: Secondary | ICD-10-CM | POA: Diagnosis present

## 2023-01-05 DIAGNOSIS — C679 Malignant neoplasm of bladder, unspecified: Secondary | ICD-10-CM | POA: Diagnosis not present

## 2023-01-05 DIAGNOSIS — Z87891 Personal history of nicotine dependence: Secondary | ICD-10-CM | POA: Diagnosis not present

## 2023-01-05 DIAGNOSIS — K219 Gastro-esophageal reflux disease without esophagitis: Secondary | ICD-10-CM | POA: Insufficient documentation

## 2023-01-05 DIAGNOSIS — M48061 Spinal stenosis, lumbar region without neurogenic claudication: Secondary | ICD-10-CM | POA: Diagnosis not present

## 2023-01-05 DIAGNOSIS — M51A3 Intervertebral annulus fibrosus defect, lumbosacral region, unspecified size: Secondary | ICD-10-CM | POA: Diagnosis not present

## 2023-01-05 DIAGNOSIS — Z7952 Long term (current) use of systemic steroids: Secondary | ICD-10-CM | POA: Diagnosis not present

## 2023-01-05 DIAGNOSIS — Z8719 Personal history of other diseases of the digestive system: Secondary | ICD-10-CM | POA: Diagnosis not present

## 2023-01-05 DIAGNOSIS — M8580 Other specified disorders of bone density and structure, unspecified site: Secondary | ICD-10-CM | POA: Insufficient documentation

## 2023-01-05 DIAGNOSIS — N184 Chronic kidney disease, stage 4 (severe): Secondary | ICD-10-CM | POA: Diagnosis not present

## 2023-01-05 DIAGNOSIS — N1330 Unspecified hydronephrosis: Secondary | ICD-10-CM | POA: Diagnosis not present

## 2023-01-05 DIAGNOSIS — K5900 Constipation, unspecified: Secondary | ICD-10-CM | POA: Insufficient documentation

## 2023-01-05 DIAGNOSIS — C787 Secondary malignant neoplasm of liver and intrahepatic bile duct: Secondary | ICD-10-CM | POA: Diagnosis not present

## 2023-01-05 DIAGNOSIS — C61 Malignant neoplasm of prostate: Secondary | ICD-10-CM | POA: Insufficient documentation

## 2023-01-05 MED ORDER — PEGFILGRASTIM-CBQV 6 MG/0.6ML ~~LOC~~ SOSY
6.0000 mg | PREFILLED_SYRINGE | Freq: Once | SUBCUTANEOUS | Status: AC
  Administered 2023-01-05: 6 mg via SUBCUTANEOUS
  Filled 2023-01-05: qty 0.6

## 2023-01-05 NOTE — Telephone Encounter (Signed)
Ok to have procedure done on 2/15, per MD. Pt will come in on 2/14 to have CBC. Lab appt for 2/16 cancelled.  Pt will run out of antibiotics by 2/15 and is wanting to know if he will need to be on antibiotics post tooth extraction. If so, he will need a refill Please advise

## 2023-01-08 ENCOUNTER — Other Ambulatory Visit: Payer: Self-pay | Admitting: Family Medicine

## 2023-01-08 MED ORDER — URIBEL 118 MG PO CAPS: 118.0000 mg | ORAL_CAPSULE | Freq: Three times a day (TID) | ORAL | 3 refills | Status: DC | PRN

## 2023-01-08 NOTE — Telephone Encounter (Signed)
Per Dr. Tasia Catchings, he will not need additional antibiotics unless recommended by the dentist. Pt informed and verbalized understanding.

## 2023-01-08 NOTE — Telephone Encounter (Signed)
Patient called and states the [name] [name] is working great to control the bladder discomfort. He requested a refill. The medication was sent to the pharmacy.

## 2023-01-10 ENCOUNTER — Ambulatory Visit: Payer: Medicare Other | Admitting: Urology

## 2023-01-16 ENCOUNTER — Encounter: Payer: Self-pay | Admitting: Licensed Clinical Social Worker

## 2023-01-16 NOTE — Progress Notes (Signed)
[facility] Clinical Social Work  CSW returned patient's call with inquiry about MOST form.  CSW was unable to leave voicemail, voicemail not set up.    Adelene Amas, [place] Worker [facility]

## 2023-01-16 NOTE — Progress Notes (Signed)
[facility] CSW Progress Note  Clinical Education officer, museum contacted patient by phone to schedule appointment to discuss MOST form. Appointment scheduled 01/22/2023 @ 1:30PM.    Adelene Amas, LCSW

## 2023-01-17 ENCOUNTER — Inpatient Hospital Stay: Payer: Medicare Other

## 2023-01-17 DIAGNOSIS — C787 Secondary malignant neoplasm of liver and intrahepatic bile duct: Secondary | ICD-10-CM

## 2023-01-17 DIAGNOSIS — C61 Malignant neoplasm of prostate: Secondary | ICD-10-CM | POA: Diagnosis not present

## 2023-01-17 LAB — CBC WITH DIFFERENTIAL/PLATELET
Abs Immature Granulocytes: 0.1 10*3/uL — ABNORMAL HIGH (ref 0.00–0.07)
Basophils Absolute: 0 10*3/uL (ref 0.0–0.1)
Basophils Relative: 0 %
Eosinophils Absolute: 0 10*3/uL (ref 0.0–0.5)
Eosinophils Relative: 0 %
HCT: 30.7 % — ABNORMAL LOW (ref 39.0–52.0)
Hemoglobin: 9.9 g/dL — ABNORMAL LOW (ref 13.0–17.0)
Immature Granulocytes: 1 %
Lymphocytes Relative: 7 %
Lymphs Abs: 0.7 10*3/uL (ref 0.7–4.0)
MCH: 31.4 pg (ref 26.0–34.0)
MCHC: 32.2 g/dL (ref 30.0–36.0)
MCV: 97.5 fL (ref 80.0–100.0)
Monocytes Absolute: 0.6 10*3/uL (ref 0.1–1.0)
Monocytes Relative: 6 %
Neutro Abs: 8.8 10*3/uL — ABNORMAL HIGH (ref 1.7–7.7)
Neutrophils Relative %: 86 %
Platelets: 133 10*3/uL — ABNORMAL LOW (ref 150–400)
RBC: 3.15 MIL/uL — ABNORMAL LOW (ref 4.22–5.81)
RDW: 15.9 % — ABNORMAL HIGH (ref 11.5–15.5)
WBC: 10.3 10*3/uL (ref 4.0–10.5)
nRBC: 0 % (ref 0.0–0.2)

## 2023-01-19 ENCOUNTER — Telehealth: Payer: Self-pay | Admitting: *Deleted

## 2023-01-19 ENCOUNTER — Other Ambulatory Visit: Payer: Medicare Other

## 2023-01-19 NOTE — Telephone Encounter (Signed)
Per Dr. Tasia Catchings, she will address at visit on 2/21.

## 2023-01-19 NOTE — Telephone Encounter (Signed)
Patient called reporong that he has developed a swollen lymph ode in his left neck at his jaw line and that he was told to report anything like this to Dr Tasia Catchings. Please advise

## 2023-01-22 ENCOUNTER — Inpatient Hospital Stay: Payer: Medicare Other | Admitting: Licensed Clinical Social Worker

## 2023-01-22 DIAGNOSIS — C61 Malignant neoplasm of prostate: Secondary | ICD-10-CM

## 2023-01-22 NOTE — Progress Notes (Signed)
[facility] CSW Progress Note  Holiday representative  met with patient and caregiver  to discuss MOST form, living will and the difference between palliative and hospice care.    Adelene Amas, LCSW

## 2023-01-23 MED FILL — Dexamethasone Sodium Phosphate Inj 100 MG/10ML: INTRAMUSCULAR | Qty: 1 | Status: AC

## 2023-01-24 ENCOUNTER — Inpatient Hospital Stay: Payer: Medicare Other

## 2023-01-24 ENCOUNTER — Encounter: Payer: Self-pay | Admitting: Oncology

## 2023-01-24 ENCOUNTER — Inpatient Hospital Stay (HOSPITAL_BASED_OUTPATIENT_CLINIC_OR_DEPARTMENT_OTHER): Payer: Medicare Other | Admitting: Oncology

## 2023-01-24 VITALS — BP 121/49 | HR 48 | Temp 96.7°F | Resp 18 | Wt 143.9 lb

## 2023-01-24 DIAGNOSIS — C9000 Multiple myeloma not having achieved remission: Secondary | ICD-10-CM

## 2023-01-24 DIAGNOSIS — C61 Malignant neoplasm of prostate: Secondary | ICD-10-CM | POA: Diagnosis not present

## 2023-01-24 DIAGNOSIS — R221 Localized swelling, mass and lump, neck: Secondary | ICD-10-CM

## 2023-01-24 DIAGNOSIS — Z7189 Other specified counseling: Secondary | ICD-10-CM

## 2023-01-24 DIAGNOSIS — G893 Neoplasm related pain (acute) (chronic): Secondary | ICD-10-CM

## 2023-01-24 DIAGNOSIS — N184 Chronic kidney disease, stage 4 (severe): Secondary | ICD-10-CM

## 2023-01-24 DIAGNOSIS — C787 Secondary malignant neoplasm of liver and intrahepatic bile duct: Secondary | ICD-10-CM

## 2023-01-24 DIAGNOSIS — D649 Anemia, unspecified: Secondary | ICD-10-CM

## 2023-01-24 DIAGNOSIS — Z5111 Encounter for antineoplastic chemotherapy: Secondary | ICD-10-CM | POA: Diagnosis not present

## 2023-01-24 DIAGNOSIS — C669 Malignant neoplasm of unspecified ureter: Secondary | ICD-10-CM

## 2023-01-24 DIAGNOSIS — K5900 Constipation, unspecified: Secondary | ICD-10-CM

## 2023-01-24 LAB — COMPREHENSIVE METABOLIC PANEL
ALT: 13 U/L (ref 0–44)
AST: 24 U/L (ref 15–41)
Albumin: 3.5 g/dL (ref 3.5–5.0)
Alkaline Phosphatase: 79 U/L (ref 38–126)
Anion gap: 11 (ref 5–15)
BUN: 35 mg/dL — ABNORMAL HIGH (ref 8–23)
CO2: 21 mmol/L — ABNORMAL LOW (ref 22–32)
Calcium: 9 mg/dL (ref 8.9–10.3)
Chloride: 106 mmol/L (ref 98–111)
Creatinine, Ser: 2.38 mg/dL — ABNORMAL HIGH (ref 0.61–1.24)
GFR, Estimated: 26 mL/min — ABNORMAL LOW (ref >60)
Glucose, Bld: 139 mg/dL — ABNORMAL HIGH (ref 70–99)
Potassium: 5 mmol/L (ref 3.5–5.1)
Sodium: 138 mmol/L (ref 135–145)
Total Bilirubin: 0.3 mg/dL (ref 0.3–1.2)
Total Protein: 5.9 g/dL — ABNORMAL LOW (ref 6.5–8.1)

## 2023-01-24 LAB — CBC WITH DIFFERENTIAL/PLATELET
Abs Immature Granulocytes: 0.05 10*3/uL (ref 0.00–0.07)
Basophils Absolute: 0 10*3/uL (ref 0.0–0.1)
Basophils Relative: 0 %
Eosinophils Absolute: 0 10*3/uL (ref 0.0–0.5)
Eosinophils Relative: 0 %
HCT: 30.5 % — ABNORMAL LOW (ref 39.0–52.0)
Hemoglobin: 9.9 g/dL — ABNORMAL LOW (ref 13.0–17.0)
Immature Granulocytes: 1 %
Lymphocytes Relative: 4 %
Lymphs Abs: 0.4 10*3/uL — ABNORMAL LOW (ref 0.7–4.0)
MCH: 31.6 pg (ref 26.0–34.0)
MCHC: 32.5 g/dL (ref 30.0–36.0)
MCV: 97.4 fL (ref 80.0–100.0)
Monocytes Absolute: 0.5 10*3/uL (ref 0.1–1.0)
Monocytes Relative: 5 %
Neutro Abs: 9.8 10*3/uL — ABNORMAL HIGH (ref 1.7–7.7)
Neutrophils Relative %: 90 %
Platelets: 273 10*3/uL (ref 150–400)
RBC: 3.13 MIL/uL — ABNORMAL LOW (ref 4.22–5.81)
RDW: 16.2 % — ABNORMAL HIGH (ref 11.5–15.5)
WBC: 10.8 10*3/uL — ABNORMAL HIGH (ref 4.0–10.5)
nRBC: 0 % (ref 0.0–0.2)

## 2023-01-24 LAB — PSA: Prostatic Specific Antigen: 84.12 ng/mL — ABNORMAL HIGH (ref 0.00–4.00)

## 2023-01-24 LAB — IRON AND TIBC
Iron: 64 ug/dL (ref 45–182)
Saturation Ratios: 24 % (ref 17.9–39.5)
TIBC: 262 ug/dL (ref 250–450)
UIBC: 198 ug/dL

## 2023-01-24 LAB — FERRITIN: Ferritin: 168 ng/mL (ref 24–336)

## 2023-01-24 MED ORDER — SODIUM CHLORIDE 0.9 % IV SOLN
Freq: Once | INTRAVENOUS | Status: AC
  Filled 2023-01-24: qty 250

## 2023-01-24 MED ORDER — SODIUM CHLORIDE 0.9 % IV SOLN
75.0000 mg/m2 | Freq: Once | INTRAVENOUS | Status: AC
  Administered 2023-01-24: 131 mg via INTRAVENOUS
  Filled 2023-01-24: qty 13.1

## 2023-01-24 MED ORDER — SODIUM CHLORIDE 0.9 % IV SOLN
10.0000 mg | Freq: Once | INTRAVENOUS | Status: AC
  Administered 2023-01-24: 10 mg via INTRAVENOUS
  Filled 2023-01-24: qty 10

## 2023-01-24 NOTE — Assessment & Plan Note (Signed)
No signs of acute infection Likely due to bladder inflammation, and Balanoposthitis- treated with Diflucan.

## 2023-01-24 NOTE — Progress Notes (Signed)
Hematology/Oncology Progress note [phone] [phone]    CHIEF COMPLAINTS/REASON FOR VISIT:  Multiple myeloma, high-grade urothelial carcinoma.metastatic prostate cancer.   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[place]) Staging form: Renal Pelvis and Ureter, AJCC 8th Edition - Clinical: Stage Unknown (cTX, cN0, cM0) - Signed by [name], MD on 03/17/2022   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 PSA slightly decreased. Today's PSA is pending.  Proceed with Docetaxel 741mm2 Q3 weeks with GCSF supports.   Urothelial carcinoma of distal ureter (HCC) High-grade [facility]thelial carcinoma[facility] as well as bladder high-grade papillary carcinoma.S/p palliative radiation.  10/31/2022 repeat cystoscopy did not reveal any recurrence. - He has ureter stent, Recommend patient continue follow up with Urology for repeat cystoscopy.. Marland KitchenPossible need  of starting keBosnia and Herzegovinand Padcev in the near future if recurrence develops.   Multiple myeloma (HCC) #Stage III IgA kappa multiple myeloma, myeloma FISH panel positive for 13q deletion, gain of 1q, t (4;14), high risk, not candidate for autologous bone marrow transplant. 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  I will hold off Daratumumab and let him start on Docetaxel treatments.  Expect that his myeloma will progress in the future. Currently, will prioritize his treatment for prostate cancer  Continue Acyclovir 20050mID  -Bone health, currently he does not have an active myeloma bone involvement.  Patient reports history of developing adverse reaction [vision loss] after Prolia treatments. -s/p  root canal. ophthalmologist clearance obtained for starting Xgeva. Hold Xgeva, he will need tooth extraction.  Encounter for antineoplastic chemotherapy Chemotherapy plan as listed above  Anemia Hemoglobin has decreased. Multi factorial.  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    Leukocytosis No signs of acute infection Likely due to bladder inflammation, and Balanoposthitis- treated with Diflucan.   Neoplasm related pain He has Percocet, not taking any right now.    Stage 4 chronic kidney disease (HCCFultonncourage oral hydration and avoid nephrotoxins.    Goals of care, counseling/discussion Overall his diagnosis is poor, due to three aggressive malignant conditions with competing need for different cancer treatments.  follow up with palliative care service Code Status DNR  MOST Form completed    Neck swelling Secondary to  recent tooth infection/extraction, improved after taking Augmentin.  Recommend patient to continue course of antibiotics.  Constipation Recommend patient to switch bowel regimen to Sennakot 2 tablets daily. Take Miralax daily PRN     Follow-up in 3 weeks for next cycles of chemotherapy. All questions were answered. The patient knows to call the clinic with any problems, questions or concerns.  [name], MD, PhD [facility] Hematology Oncology 01/24/2023      HISTORY OF PRESENTING ILLNESS:  [name] [name] is a 82 y.o. male presents for follow-up of multiple myeloma and high-grade urothelial carcinoma, and metastatic castration resistant prostate cancer.   Oncology History  Multiple myeloma ([place])  03/02/2022 Imaging   PET showed No definitive signs of multiple myeloma by FDG PET.   Obstruction of the RIGHT ureter with soft tissue mass in the RIGHT pelvis showing increased metabolic activity suspicious first and foremost for urothelial neoplasm given location adjacent to RIGHT UVJ and affect upon the RIGHT ureter.   Little or no excretion of FDG on the RIGHT but still with uptake of FDG by renal parenchyma. Degree of hydronephrosis is not substantially changed but lack of FDG excretion on the RIGHT is compatible with secondary sign of physiologic significance of ureteral obstruction.   RIGHT pelvic sidewall uptake without visible lesion, could represent tiny lymph node not visible on noncontrast imaging.   Aortic atherosclerosis.  Pulmonary emphysema. Aortic Atherosclerosis and Emphysema    03/17/2022 Initial Diagnosis   Multiple myeloma ([place]) -Patient has progressively worsening kidney function.  He is scheduled for kidney biopsy. 01/26/2022, free kappa light chain 1058, lambda 15.3, light chain ratio 69. Protein electrophoresis showed restricted band-M spike migrating in the beta-1 globulin region. ANA negative.  ANCA negative. Random urine protein electrophoresis showed abnormal  protein band detected in the gamma globulin And a second possible abnormal protein band that may represent monoclonal protein.   02/23/2022 multiple myeloma showed IgA 2883, M protein of 1.8, beta 2 microglobulin 6.9, kappa free light chain 1268.9, lambda 13.2, free light chain ratio 96.13.   CMP showed creatinine of 3.41, that is significantly worse than his baseline in June 2022. 02/28/2022, 24-hour urine protein electrophoresis showed M protein of 729 mg, IgA monoclonal kappa light chain. 03/01/2022, [facility] kidney biopsy showed diffuse light chain tubulopathy, and light chain cast nephropathy, moderate interstitial fibrosis,4% global glomerular sclerosis and the marked atherosclerosis.  03/06/2022, patient underwent a bone marrow biopsy. Pathology showed hypercellular bone marrow with plasma cell neoplasm, representing 20% of all cells in the aspirate associated with prominent interstitial infiltrates and numerous predominantly small clusters in the clots in the biopsy sections.  The plasma cells display kappa light chain restriction consistent with plasma cell neoplasm. Normal cytogenetics, myeloma FISH panel positive for 13q deletion, gain of 1q, t (4;14)   03/17/2022 Cancer Staging   Staging form: Plasma Cell Myeloma and Plasma Cell Disorders, AJCC 8th Edition - Clinical stage from 03/17/2022: RISS Stage III (Beta-2-microglobulin (mg/L): 6.9, Albumin (g/dL): 3.3, ISS: Stage III, High-risk cytogenetics: Present, LDH: Normal) - Signed by [name], MD on 03/17/2022 Stage prefix: Initial diagnosis Beta 2 microglobulin range (mg/L): Greater than or equal to 5.5 Albumin range (g/dL): Less than 3.5 Cytogenetics: t(4;14) translocation, Other mutation, 1q addition   03/24/2022 - 07/19/2022 Chemotherapy   Patient is on Treatment Plan : MYELOMA NEWLY DIAGNOSED Daratumumab + Dexamethasone Weekly (DaraRd) q28d     09/21/2022 Imaging   MRI lumbar spine wo contrast 1. 6 mm nodule in the cauda equina at the L4-5  level. This likely represents a nerve sheath tumor such as a schwannoma or  neurofibroma. Recommend MRI of the lumbar spine with contrast for further evaluation. 2. 12 mm T2 hyperintense lesion along the inferior aspect of the S1 vertebral body. This may represent focal involvement of multiple myeloma. No other discrete lesions are present. No pathologic fracture is present. 3. Mild bilateral foraminal stenosis at L3-4 and L4-5. 4. Left paramedian annular tear at L5-S1 near the left S1 nerve roots.   12/13/2022 -  Chemotherapy   Patient is on Treatment Plan : PROSTATE Docetaxel (75) q21d     01/10/2023 - 01/10/2023 Chemotherapy   Patient is on Treatment Plan : PROSTATE Docetaxel (75) + Prednisone q21d     Urothelial carcinoma of distal ureter ([place])  03/02/2022 Imaging   PET showed No definitive signs of multiple myeloma by FDG PET.   Obstruction of the RIGHT ureter with soft tissue mass in the RIGHT pelvis showing increased metabolic activity suspicious first and foremost for urothelial neoplasm given location adjacent to RIGHT UVJ and affect upon the RIGHT ureter.   Little or no excretion of FDG on the RIGHT but still with uptake of FDG by renal parenchyma. Degree of hydronephrosis is not substantially changed but lack of FDG excretion on the RIGHT is compatible with secondary sign of physiologic significance of ureteral obstruction.   RIGHT pelvic sidewall uptake without visible lesion, could represent tiny lymph node not visible on noncontrast imaging.   Aortic atherosclerosis.  Pulmonary emphysema. Aortic Atherosclerosis and Emphysema    03/17/2022 Initial Diagnosis   Urothelial carcinoma of distal ureter ([place])  -03/14/2022, cystoscopy showed diffuse narrowing right distal ureter with hydroureter following up the distal ureter and extending proximally.  Right ureteroscopy showed nodular tumor distal ureter and a convincing area without tumor suspicious for extrinsic obstruction.s/p   right  ureteral stent placement. Ureteral tumor showed a tiny fragments of fibrous tissue, interpretation limited by thermal and crush artifact.  Right distal ureter saline barbotage showed hight grade urothelial carcinoma.    03/17/2022 Cancer Staging   Staging form: Renal Pelvis and Ureter, AJCC 8th Edition - Clinical: Stage Unknown (cTX, cN0, cM0) - Signed by [name], MD on 03/17/2022 Stage prefix: Initial diagnosis WHO/ISUP grade (low/high): High Grade Histologic grading system: 2 grade system   06/05/2022 Imaging   PET scan at [facility] showed 1.Interval placement of right-sided double-J ureteral stent with resolution of hydronephrosis and improved excretion of radiotracer.  2.There is persistent abnormal hypermetabolic tissue extending posterior lateral from the right aspect of the bladder encasing the distal ureter/stent which measures approximately 5.1 x 4.2 (CT image 243).  3.There are similar linear areas of abnormal hypermetabolic tissue seen to extend more superiorly along the right posterior pelvic wall/peritoneum, and towards the sciatic notch (fused image 238). Linear configuration suggests possible perineural spread. Note that this soft tissue abuts and may encase pelvic vasculature including the external and internal iliac arteries.  4.NEW single subcentimeter but abnormal focus of FDG avidity in the right presacral region (CT image 231) measuring 1.6 cm representing new site of neoplastic disease   06/19/2022 - 08/10/2022 Radiation Therapy   Pelvis RT 06/19/22-08/21/22 Bladder RT 07/27/22- 08/20/22    09/18/2022 Imaging   PET scan at [facility] showed 1. Evidence of disease progression with multifocal new hypermetabolic lesions around the periphery of the liver as described above. The evaluation of these lesions is limited by significant misregistration between the PET and CT portions of the examination. Contrast-enhanced CT or MRI could be used for confirmation and/or guidance of biopsy as necessary.  There  is also a lung parenchymal nodule with new associated FDG uptake, also suspicious for a site of metastasis.   2. Although the right nephroureteral stent appears to be well positioned, the right kidney demonstrates slightly increased hydronephrosis with significant surrounding fat stranding and relatively delayed clearance of FDG. These findings could suggest stent malfunction or be related to upper urinary tract infection. Suggest clinical correlation with any patient signs or symptoms such as right flank pain, fever, and/or urinalysis findings.     09/21/2022 Imaging   MRI lumbar spine wo contrast 1. 6 mm nodule in the cauda equina at the L4-5 level. 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 Recommend MRI of the lumbar spine with contrast for further evaluation. 2. 12 mm T2 hyperintense lesion along the inferior aspect of the S1 vertebral body. This may represent focal involvement of multiple myeloma. No other discrete lesions are present. No pathologic fracture is present. 3. Mild bilateral foraminal stenosis at L3-4 and L4-5. 4. Left paramedian annular tear at L5-S1 near the left S1 nerve roots.    Genetic Testing   Negative genetic testing. No pathogenic variants identified on the [facility] Multi-Cancer+RNA panel. The report date is 11/19/2022.  The Multi-Cancer + RNA Panel offered by [facility] includes sequencing and/or deletion/duplication analysis of the following 70 genes:  AIP*, ALK, APC*, ATM*, AXIN2*, BAP1*, BARD1*, BLM*, BMPR1A*, BRCA1*, BRCA2*, BRIP1*, CDC73*, CDH1*, CDK4, CDKN1B*, CDKN2A, CHEK2*, CTNNA1*, DICER1*, EPCAM, EGFR, FH*, FLCN*, GREM1, HOXB13, KIT, LZTR1, MAX*, MBD4, MEN1*, MET, MITF, MLH1*, MSH2*, MSH3*, MSH6*, MUTYH*, NF1*, NF2*, NTHL1*, PALB2*, PDGFRA, PMS2*, POLD1*, POLE*, POT1*, PRKAR1A*, PTCH1*, PTEN*, RAD51C*, RAD51D*, RB1*, RET, SDHA*, SDHAF2*, SDHB*, SDHC*, SDHD*, SMAD4*, SMARCA4*, SMARCB1*, SMARCE1*, STK11*, SUFU*, TMEM127*,  TP53*, TSC1*, TSC2*, VHL*. RNA analysis is performed for * genes.   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 Evidence of local prostate carcinoma within the pelvis with peritoneal implants along the bowel serosal surface as well as a metastatic LEFT iliac lymph node. 2. 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 3. Multifocal pleural metastasis in the RIGHT hemithorax. 4. No clear evidence of skeletal metastasis      Genetic Testing   Negative genetic testing. No pathogenic variants identified on the [facility] Multi-Cancer+RNA panel. The report date is 11/19/2022.  The Multi-Cancer + RNA Panel offered by [facility] includes sequencing and/or deletion/duplication analysis of the following 70 genes:  AIP*, ALK, APC*, ATM*, AXIN2*, BAP1*, BARD1*, BLM*, BMPR1A*, BRCA1*, BRCA2*, BRIP1*, CDC73*, CDH1*, CDK4, CDKN1B*, CDKN2A, CHEK2*, CTNNA1*, DICER1*, EPCAM, EGFR, FH*, FLCN*, GREM1, HOXB13, KIT, LZTR1, MAX*, MBD4, MEN1*, MET, MITF, MLH1*, MSH2*, MSH3*, MSH6*, MUTYH*, NF1*, NF2*, NTHL1*, PALB2*, PDGFRA, PMS2*, POLD1*, POLE*, POT1*, PRKAR1A*, PTCH1*, PTEN*, RAD51C*, RAD51D*, RB1*, RET, SDHA*, SDHAF2*, SDHB*, SDHC*, SDHD*, SMAD4*, SMARCA4*, SMARCB1*, SMARCE1*, STK11*, SUFU*, TMEM127*, TP53*, TSC1*, TSC2*, VHL*. RNA analysis is performed for * genes.    11/27/22 UA showed RBC >50, moderate leukocytes, Urine culture negative.  Cipro did not provide him any relieve. Was seen by symptomatic management clinic and was prescribed a course steroid and Keflex.  11/28/22 CXR showed  bronchitis - URI symptom has improved.  11/29/22 US renal Mild right hydronephrosis and proximal hydroureter. Low level echoes within the visualized proximal right ureter and renal pelvis,which may reflect blood products or debris.2. Stent is partially visualized within the urinary bladder. Right ureteral jet was not definitively seen.  He was seen by [facility] urology and his symptoms were felt to be due to post treatment inflammation. He was recommended to take Flomax and NSAIDS.  Due to his CKD, we recommend him not to take NSAIDS.  Today he is here for chemotherapy treatments.  He continues to have increased urine frequently, burning with urination, and doesn't feel like he is emptying well. Also swelling of penis.  We arranged him to be seen by [facility] urology team today and his symptoms was felt to be due to stent discomfort versus underlying malignancy and constipation may also contribute to his symtoms.  Marland Kitchen He was recommended to start Vergas. Miralax for constipation.  Balanoposthitis, he will take one dose of diflucan.   INTERVAL HISTORY [name] [name] is a 82 y.o. male who has above history reviewed by me today presents for follow up visit for multiple myeloma and high-grade urothelial carcinoma, metastatic prostate cancer.  +dysuria/urinary urgency symptoms are better.  Overall he tolerated  Docetaxel with GCSF support.  + tooth pain/infection, s/p tooth extraction, he has noticed upper neck lymph nodes swelling bilaterally. He was prescribed another course of Amoxicillin which he started 2 days ago, swelling of both sides have improved. Denies fever, chills, nausea or vomiting.   + Constipation, he takes colace daily + Miralax.    Review of Systems  Constitutional:  Positive for fatigue. Negative for appetite change, chills, fever and unexpected weight change.  HENT:   Negative for hearing loss and voice change.   Eyes:  Negative for eye problems and icterus.  Respiratory:  Negative for chest  tightness, cough and shortness of breath.   Cardiovascular:  Negative for chest pain and leg swelling.  Gastrointestinal:  Positive for constipation. Negative for abdominal distention, abdominal pain and diarrhea.  Endocrine: Negative for hot flashes.  Genitourinary:  Positive for dysuria and frequency. Negative for difficulty urinating.   Musculoskeletal:  Negative for arthralgias.  Skin:  Negative for itching.  Neurological:  Negative for light-headedness and numbness.  Hematological:  Negative for adenopathy. Does not bruise/bleed easily.  Psychiatric/Behavioral:  Negative for confusion.      MEDICAL HISTORY:  Past Medical History:  Diagnosis Date   Age related osteoporosis    Anemia    Barrett's esophagus    Bradycardia    Cancer (HCC)    PROSTATE   Cataract    CKD (chronic kidney disease) stage 3, GFR 30-59 ml/min (HCC)    Colon polyps    DDD (degenerative disc disease), cervical    DDD (degenerative disc disease), lumbar    Femur fracture (HCC)    Right   Fundic gland polyposis of stomach    Fundic gland polyps of stomach, benign    Gastritis    GERD (gastroesophageal reflux disease)    Hematuria    Hemorrhage of rectum and anus    History of colon polyps    Insomnia    Lumbago    Osteopenia of the elderly    Skin cancer    Sleep apnea     SURGICAL HISTORY: Past Surgical History:  Procedure Laterality Date   BAND HEMORRHOIDECTOMY     COLONOSCOPY     COLONOSCOPY WITH PROPOFOL N/A 05/06/2018  Procedure: COLONOSCOPY WITH PROPOFOL;  Surgeon: Lollie Sails, MD;  Location: [facility] ENDOSCOPY;  Service: Endoscopy;  Laterality: N/A;   COLONOSCOPY WITH PROPOFOL N/A 09/23/2018   Procedure: COLONOSCOPY WITH PROPOFOL;  Surgeon: Lollie Sails, MD;  Location: [facility] ENDOSCOPY;  Service: Endoscopy;  Laterality: N/A;   COLONOSCOPY WITH PROPOFOL N/A 03/30/2021   Procedure: COLONOSCOPY WITH PROPOFOL;  Surgeon: Toledo, Benay Pike, MD;  Location: [facility] ENDOSCOPY;  Service:  Gastroenterology;  Laterality: N/A;   CYSTOSCOPY W/ URETERAL STENT PLACEMENT Right 03/14/2022   Procedure: CYSTOSCOPY WITH RETROGRADE PYELOGRAM/URETERAL STENT PLACEMENT;  Surgeon: Abbie Sons, MD;  Location: [facility] ORS;  Service: Urology;  Laterality: Right;   ESOPHAGOGASTRODUODENOSCOPY (EGD) WITH PROPOFOL N/A 10/03/2016   Procedure: ESOPHAGOGASTRODUODENOSCOPY (EGD) WITH PROPOFOL;  Surgeon: Lollie Sails, MD;  Location: [facility] ENDOSCOPY;  Service: Endoscopy;  Laterality: N/A;   EYE SURGERY     CATARACTS   EYELID SURGERY     FLEXIBLE SIGMOIDOSCOPY     FRACTURE SURGERY Right 2016   femur   FRACTURE SURGERY     ORIF DISTAL FEMUR FRACTURE     TONSILLECTOMY     TRANSURETHRAL RESECTION OF BLADDER TUMOR N/A 03/14/2022   Procedure: TRANSURETHRAL RESECTION OF BLADDER TUMOR (TURBT);  Surgeon: Abbie Sons, MD;  Location: [facility] ORS;  Service: Urology;  Laterality: N/A;    SOCIAL HISTORY: Social History   Socioeconomic History   Marital status: Married    Spouse name: Not on file   Number of children: Not on file   Years of education: Not on file   Highest education level: Not on file  Occupational History   Not on file  Tobacco Use   Smoking status: Former    Packs/day: 1.00    Years: 15.00    Total pack years: 15.00    Types: Cigarettes    Quit date: 10/03/1974    Years since quitting: 48.3   Smokeless tobacco: Never  Vaping Use   Vaping Use: Never used  Substance and Sexual Activity   Alcohol use: Not Currently    Alcohol/week: 1.0 standard drink of alcohol    Types: 1 Cans of beer per week    Comment: 1 beer every 2 weeks   Drug use: No   Sexual activity: Yes    Birth control/protection: None  Other Topics Concern   Not on file  Social History Narrative   ** Merged History Encounter **       Social Determinants of Health   Financial Resource Strain: Medium Risk (01/22/2023)   Overall Financial Resource Strain (CARDIA)    Difficulty of Paying Living Expenses:  Somewhat hard  Food Insecurity: No Food Insecurity (01/22/2023)   Hunger Vital Sign    Worried About Running Out of Food in the Last Year: Never true    Ran Out of Food in the Last Year: Never true  Transportation Needs: No Transportation Needs (01/22/2023)   PRAPARE - Hydrologist (Medical): No    Lack of Transportation (Non-Medical): No  Physical Activity: Inactive (01/22/2023)   Exercise Vital Sign    Days of Exercise per Week: 0 days    Minutes of Exercise per Session: 0 min  Stress: Stress Concern Present (01/22/2023)   [place]    Feeling of Stress : To some extent  Social Connections: Socially Integrated (01/22/2023)   Social Connection and Isolation Panel [NHANES]    Frequency of Communication with Friends and Family:  More than three times a week    Frequency of Social Gatherings with Friends and Family: More than three times a week    Attends Religious Services: 1 to 4 times per year    Active Member of [employer] or Organizations: No    Attends Music therapist: 1 to 4 times per year    Marital Status: Married  Human resources officer Violence: Not At Risk (01/22/2023)   Humiliation, Afraid, Rape, and Kick questionnaire    Fear of Current or Ex-Partner: No    Emotionally Abused: No    Physically Abused: No    Sexually Abused: No    FAMILY HISTORY: Family History  Problem Relation Age of Onset   Breast cancer Mother        dx 73s-50s   Prostate cancer Paternal Grandfather 81   Breast cancer Cousin     ALLERGIES:  is allergic to ciprofloxacin, demerol [meperidine hcl], gabapentin, prolia [denosumab], demerol [meperidine], and nsaids.  MEDICATIONS:  Current Outpatient Medications  Medication Sig Dispense Refill   acyclovir (ZOVIRAX) 200 MG capsule Take 1 capsule (200 mg total) by mouth 2 (two) times daily. 60 capsule 3   amoxicillin (AMOXIL) 500 MG capsule Take 500 mg by  mouth 2 (two) times daily.     Calcium Carbonate (CALCIUM 500 PO) Take 1 tablet by mouth daily.     Cholecalciferol (VITAMIN D3) 125 MCG (5000 UT) CAPS Take 5,000 Units by mouth daily.     dexamethasone (DECADRON) 4 MG tablet Take 4 mg by mouth 2 (two) times daily with a meal. Sig: Take 2 tabs by mouth 2 times daily starting day before chemo. Then take 2 tabs daily for 2 days starting day after chemo. Take with food.     ferrous sulfate 325 (65 FE) MG tablet Take 325 mg by mouth daily.     HYDROcodone-acetaminophen (NORCO) 5-325 MG tablet Take 1 tablet by mouth every 6 (six) hours as needed for moderate pain. 30 tablet 0   ipratropium (ATROVENT) 0.03 % nasal spray Place 1 spray into both nostrils in the morning.     losartan (COZAAR) 25 MG tablet Take 25 mg by mouth daily.     melatonin 5 MG TABS Take 5 mg by mouth at bedtime.     Meth-Hyo-M Bl-Na Phos-Ph Sal (URIBEL) 118 MG CAPS Take 1 capsule (118 mg total) by mouth 3 (three) times daily as needed. 30 capsule 3   Multiple Vitamins-Minerals (MENS 50+ MULTIVITAMIN) TABS Take 1 tablet by mouth daily.     nystatin cream (MYCOSTATIN) Apply 1 Application topically 2 (two) times daily. 30 g 0   senna (SENOKOT) 8.6 MG TABS tablet Take 2 tablets (17.2 mg total) by mouth daily. 120 tablet 0   tamsulosin (FLOMAX) 0.4 MG CAPS capsule Take 1 capsule by mouth daily.     traZODone (DESYREL) 50 MG tablet Take 25 mg by mouth at bedtime.     trospium (SANCTURA) 20 MG tablet Take 1 tablet (20 mg total) by mouth 2 (two) times daily. 60 tablet 0   Vibegron (GEMTESA) 75 MG TABS Take 75 mg by mouth daily. 28 tablet 0   No current facility-administered medications for this visit.   Facility-Administered Medications Ordered in Other Visits  Medication Dose Route Frequency Provider Last Rate Last Admin   DOCEtaxel (TAXOTERE) 131 mg in sodium chloride 0.9 % 250 mL chemo infusion  75 mg/m2 (Treatment Plan Recorded) Intravenous Once [name], MD 263 mL/hr at 01/24/23  1044 131  mg at 01/24/23 1044     PHYSICAL EXAMINATION: ECOG PERFORMANCE STATUS: 1 - Symptomatic but completely ambulatory Today's Vitals   01/24/23 0858  BP: (!) 121/49  Pulse: (!) 48  Resp: 18  Temp: (!) 96.7 F (35.9 C)  Weight: 143 lb 14.4 oz (65.3 kg)  PainSc: 0-No pain   Body mass index is 22.54 kg/m.   Physical Exam Constitutional:      General: He is not in acute distress. HENT:     Head: Normocephalic and atraumatic.  Eyes:     General: No scleral icterus. Cardiovascular:     Rate and Rhythm: Normal rate.  Pulmonary:     Effort: Pulmonary effort is normal. No respiratory distress.     Breath sounds: No wheezing.  Abdominal:     General: There is no distension.     Palpations: Abdomen is soft.  Musculoskeletal:        General: No deformity. Normal range of motion.     Cervical back: Normal range of motion and neck supple.  Skin:    General: Skin is warm and dry.     Findings: No erythema.  Neurological:     Mental Status: He is alert and oriented to person, place, and time. Mental status is at baseline.     Cranial Nerves: No cranial nerve deficit.  Psychiatric:        Mood and Affect: Mood normal.      LABORATORY DATA:  I have reviewed the data as listed    Latest Ref Rng & Units 01/24/2023    8:35 AM 01/17/2023    8:53 AM 01/03/2023    8:40 AM  CBC  WBC 4.0 - 10.5 K/uL 10.8  10.3  11.7   Hemoglobin 13.0 - 17.0 g/dL 9.9  9.9  9.8   Hematocrit 39.0 - 52.0 % 30.5  30.7  29.7   Platelets 150 - 400 K/uL 273  133  252        Latest Ref Rng & Units 01/24/2023    8:35 AM 01/03/2023    8:40 AM 12/20/2022    9:05 AM  CMP  Glucose 70 - 99 mg/dL 139  138  111   BUN 8 - 23 mg/dL 35  47  44   Creatinine 0.61 - 1.24 mg/dL 2.38  2.44  2.53   Sodium 135 - 145 mmol/L 138  137  136   Potassium 3.5 - 5.1 mmol/L 5.0  4.1  4.5   Chloride 98 - 111 mmol/L 106  103  102   CO2 22 - 32 mmol/L 21  24  26   $ Calcium 8.9 - 10.3 mg/dL 9.0  8.9  8.6   Total Protein 6.5  - 8.1 g/dL 5.9  5.7  5.8   Total Bilirubin 0.3 - 1.2 mg/dL 0.3  0.5  0.6   Alkaline Phos 38 - 126 U/L 79  79  75   AST 15 - 41 U/L 24  24  24   $ ALT 0 - 44 U/L 13  13  21     $ Iron/TIBC/Ferritin/ %Sat    Component Value Date/Time   IRON 64 01/24/2023 0835   TIBC 262 01/24/2023 0835   FERRITIN 168 01/24/2023 0835   IRONPCTSAT 24 01/24/2023 0835       RADIOGRAPHIC STUDIES: I have personally reviewed the radiological images as listed and agreed with the findings in the report.  DG Abd 1 View  Result Date: 12/14/2022 CLINICAL DATA:  Ureteral stent.  EXAM: ABDOMEN - 1 VIEW COMPARISON:  Abdominal x-ray 11/29/2022 FINDINGS: The right ureteral stent is unchanged in position. Bowel-gas pattern is nonobstructive. There is a large amount of stool throughout the colon. Phleboliths are again noted in the pelvis, unchanged. Three right-sided hip screws are present. IMPRESSION: 1. Right ureteral stent is unchanged in position. 2. Large amount of stool throughout the colon. Electronically Signed   By: Ronney Asters M.D.   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 Additional history of bladder cancer. EXAM: NUCLEAR MEDICINE PET SKULL BASE TO THIGH TECHNIQUE: 9.7 mCi F18 Piflufolastat (Pylarify) was injected intravenously. Full-ring PET imaging was performed from the skull base to thigh after the radiotracer. CT data was obtained and used for attenuation correction and anatomic localization. COMPARISON:  FDG PET scan 05/02/2022 FINDINGS: NECK No radiotracer activity in neck lymph nodes. Incidental CT finding: None. CHEST Intense radiotracer activity at the junction of the first rib in the manubrium along the pleural surface with SUV max equal 15.1 (image 81). Intense activity within the RIGHT lower lobe along the pleural surface with SUV max equal 27.8 (image 132). There is  mild pleural thickening at this level. Additional lesion along the pleural surface of the RIGHT lower lobe on image 110 with SUV max equal 27. Small lymph node position between the esophagus and descending thoracic aorta adjacent to the spine with SUV max equal 11.5 on image 116. Potential small lymph node evident on the CT portion exam measuring 2-3 mm. Incidental CT finding: No discrete pulmonary nodules. ABDOMEN/PELVIS Prostate: No focal activity in prostatectomy bed. Intense metabolic activity within the deep pelvis adjacent to the rectum with SUV max equal 30.4 (image 223). No clear CT correlation on noncontrast exam. Potential serosal metastatic implant. Additional potential small bowel serosal implant on image 220 in the deep LEFT pelvis. Lymph nodes: Radiotracer avid LEFT external iliac lymph node measuring 12 mm image 211 SUV max equal 25.8. Liver: Multifocal round lesion bilobed hepatic lesions with intense radiotracer activity. Example lesion in the superior LEFT hepatic lobe measuring 35 mm with SUV max equal 29.6 (image 130).\ Example lesion in the RIGHT hepatic lobe measuring 38 mm SUV max equal 36.4. Approximately 15 lesions in the liver. Incidental CT finding: Double-J ureteral stent on the RIGHT without obstruction. SKELETON No clear evidence skeletal metastasis. Lesions in the RIGHT pleural space adjacent ribs are favored pleural lesions rather than skeletal lesions. IMPRESSION: 1. Evidence of local prostate carcinoma within the pelvis with peritoneal implants along the bowel serosal surface as well as a metastatic LEFT iliac lymph node. 2. 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 3. Multifocal pleural metastasis in the RIGHT hemithorax. 4. No clear evidence of skeletal metastasis Electronically Signed   By: Suzy Bouchard M.D.   On: 12/05/2022 16:39   US RENAL  Result Date: 11/29/2022 CLINICAL DATA:  Dysuria, hematuria, urothelial carcinoma EXAM:  RENAL / URINARY TRACT ULTRASOUND COMPLETE COMPARISON:  Ultrasound 02/02/2022, PET-CT 03/02/2022 FINDINGS: Right Kidney: Renal measurements: 9.5 x 5.0 x 6.9 cm = volume: 173 mL. Echogenicity within normal limits. No mass or shadowing stone visualized. Mild hydronephrosis. The proximal right ureter is dilated. Low level echoes within the visualized proximal ureter and right renal pelvis. Left Kidney: Renal measurements: 10.2 x 7.1 x 5.5 cm = volume: 204 mL. Echogenicity within normal limits. Benign cysts within the lower pole  of the left kidney which do not require follow-up imaging. No shadowing stone or hydronephrosis visualized. Bladder: Appears normal for degree of bladder distention. Stent is partially visualized within the urinary bladder. Right ureteral jet was not definitively seen. Other: None. IMPRESSION: 1. Mild right hydronephrosis and proximal hydroureter. Low level echoes within the visualized proximal right ureter and renal pelvis, which may reflect blood products or debris. 2. Stent is partially visualized within the urinary bladder. Right ureteral jet was not definitively seen. Electronically Signed   By: Davina Poke D.O.   On: 11/29/2022 14:54   DG Abd 1 View  Result Date: 11/29/2022 CLINICAL DATA:  Flank pain, dysuria EXAM: ABDOMEN - 1 VIEW COMPARISON:  PET-CT done on 03/02/2022 FINDINGS: Bowel gas pattern is nonspecific. Right ureteral stent is seen in place. Phleboliths are seen in pelvis. There is previous internal fixation in the right femur. IMPRESSION: Right ureteral stent is noted in place. Nonspecific bowel gas pattern. Electronically Signed   By: Elmer Picker M.D.   On: 11/29/2022 14:49   DG Chest 2 View  Result Date: 11/28/2022 CLINICAL DATA:  Cough.  Metastatic prostate cancer. EXAM: CHEST - 2 VIEW COMPARISON:  06/21/2022 FINDINGS: Heart size is normal. Mild aortic tortuosity. Bronchial thickening suggesting bronchitis. No consolidation, collapse or effusion. No  acute bone finding. IMPRESSION: Bronchitis pattern. No consolidation or collapse. Electronically Signed   By: Nelson Chimes M.D.   On: 11/28/2022 12:19

## 2023-01-24 NOTE — Assessment & Plan Note (Addendum)
He has Percocet, not taking any right now.

## 2023-01-24 NOTE — Assessment & Plan Note (Signed)
Chemotherapy plan as listed above 

## 2023-01-24 NOTE — Patient Instructions (Signed)
[facility]  Discharge Instructions: Thank you for choosing [facility] to provide your oncology and hematology care.  If you have a lab appointment with the Cancer Center, please go directly to the Cancer Center and check in at the registration area.  Wear comfortable clothing and clothing appropriate for easy access to any Portacath or PICC line.   We strive to give you quality time with your provider. You may need to reschedule your appointment if you arrive late (15 or more minutes).  Arriving late affects you and other patients whose appointments are after yours.  Also, if you miss three or more appointments without notifying the office, you may be dismissed from the clinic at the provider's discretion.      For prescription refill requests, have your pharmacy contact our office and allow 72 hours for refills to be completed.     To help prevent nausea and vomiting after your treatment, we encourage you to take your nausea medication as directed.  BELOW ARE SYMPTOMS THAT SHOULD BE REPORTED IMMEDIATELY: *FEVER GREATER THAN 100.4 F (38 C) OR HIGHER *CHILLS OR SWEATING *NAUSEA AND VOMITING THAT IS NOT CONTROLLED WITH YOUR NAUSEA MEDICATION *UNUSUAL SHORTNESS OF BREATH *UNUSUAL BRUISING OR BLEEDING *URINARY PROBLEMS (pain or burning when urinating, or frequent urination) *BOWEL PROBLEMS (unusual diarrhea, constipation, pain near the anus) TENDERNESS IN MOUTH AND THROAT WITH OR WITHOUT PRESENCE OF ULCERS (sore throat, sores in mouth, or a toothache) UNUSUAL RASH, SWELLING OR PAIN  UNUSUAL VAGINAL DISCHARGE OR ITCHING   Items with * indicate a potential emergency and should be followed up as soon as possible or go to the Emergency Department if any problems should occur.  Please show the CHEMOTHERAPY ALERT CARD or IMMUNOTHERAPY ALERT CARD at check-in to the Emergency Department and triage nurse.  Should you have questions after your visit  or need to cancel or reschedule your appointment, please contact [facility]  [phone] and follow the prompts.  Office hours are 8:00 a.m. to 4:30 p.m. Monday - Friday. Please note that voicemails left after 4:00 p.m. may not be returned until the following business day.  We are closed weekends and major holidays. You have access to a nurse at all times for urgent questions. Please call the main number to the clinic [phone] and follow the prompts.  For any non-urgent questions, you may also contact your provider using MyChart. We now offer e-Visits for anyone 18 and older to request care online for non-urgent symptoms. For details visit [email or web address].   Also download the MyChart app! Go to the app store, search "MyChart", open the app, select [facility], and log in with your MyChart username and password.    

## 2023-01-24 NOTE — Assessment & Plan Note (Signed)
Encourage oral hydration and avoid nephrotoxins.   

## 2023-01-24 NOTE — Assessment & Plan Note (Signed)
Recommend patient to switch bowel regimen to Sennakot 2 tablets daily. Take Miralax daily PRN

## 2023-01-24 NOTE — Assessment & Plan Note (Signed)
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  follow up with palliative care service Code Status DNR  MOST Form completed

## 2023-01-24 NOTE — Assessment & Plan Note (Signed)
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  I will hold off Daratumumab and let him start on Docetaxel treatments.  Expect that his myeloma will progress in the future. Currently, will prioritize his treatment for prostate cancer  Continue Acyclovir '200mg'$  BID  -Bone health, currently he does not have an active myeloma bone involvement.  Patient reports history of developing adverse reaction [vision loss] after Prolia treatments. -s/p  root canal. ophthalmologist clearance obtained for starting Xgeva. Hold Xgeva, he will need tooth extraction.

## 2023-01-24 NOTE — Assessment & Plan Note (Signed)
Secondary to recent tooth infection/extraction, improved after taking Augmentin.  Recommend patient to continue course of antibiotics.

## 2023-01-24 NOTE — Assessment & Plan Note (Signed)
Hemoglobin has decreased. Multi factorial.  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

## 2023-01-24 NOTE — Assessment & Plan Note (Addendum)
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 PSA slightly decreased. Today's PSA is pending.  Proceed with Docetaxel 753mm2 Q3 weeks with GCSF supports.

## 2023-01-24 NOTE — Assessment & Plan Note (Addendum)
High-grade urothelial carcinoma, Cystoscopy at [facility] showed ureter high-grade carcinoma as well as bladder high-grade papillary carcinoma.S/p palliative radiation.  10/31/2022 repeat cystoscopy did not reveal any recurrence. - He has ureter stent, Recommend patient continue follow up with Urology for repeat cystoscopy.Marland Kitchen  Possible need of starting [place] and [place] in the near future if recurrence develops.

## 2023-01-24 NOTE — Progress Notes (Signed)
Pt here for follow up. Pt is currently on antibiotics after dental procedure. He reports that he has a small knot to left side of nec and behind left ear, which have deceased in size since he has been on antibiotics.

## 2023-01-26 ENCOUNTER — Inpatient Hospital Stay: Payer: Medicare Other

## 2023-01-26 DIAGNOSIS — C61 Malignant neoplasm of prostate: Secondary | ICD-10-CM | POA: Diagnosis not present

## 2023-01-26 DIAGNOSIS — R768 Other specified abnormal immunological findings in serum: Secondary | ICD-10-CM

## 2023-01-26 DIAGNOSIS — C9000 Multiple myeloma not having achieved remission: Secondary | ICD-10-CM

## 2023-01-26 DIAGNOSIS — R7689 Other specified abnormal immunological findings in serum: Secondary | ICD-10-CM

## 2023-01-26 DIAGNOSIS — N289 Disorder of kidney and ureter, unspecified: Secondary | ICD-10-CM

## 2023-01-26 MED ORDER — PEGFILGRASTIM-CBQV 6 MG/0.6ML ~~LOC~~ SOSY
6.0000 mg | PREFILLED_SYRINGE | Freq: Once | SUBCUTANEOUS | Status: AC
  Administered 2023-01-26: 6 mg via SUBCUTANEOUS
  Filled 2023-01-26: qty 0.6

## 2023-01-29 ENCOUNTER — Encounter: Payer: Self-pay | Admitting: Radiation Oncology

## 2023-01-29 ENCOUNTER — Telehealth: Payer: Self-pay | Admitting: *Deleted

## 2023-01-29 ENCOUNTER — Telehealth: Payer: Self-pay | Admitting: Hospice and Palliative Medicine

## 2023-01-29 ENCOUNTER — Ambulatory Visit
Admission: RE | Admit: 2023-01-29 | Discharge: 2023-01-29 | Disposition: A | Discharge status: Home / Self Care | Payer: Medicare Other | Source: Ambulatory Visit | Attending: Radiation Oncology | Admitting: Radiation Oncology

## 2023-01-29 ENCOUNTER — Telehealth: Payer: Self-pay

## 2023-01-29 VITALS — BP 132/57 | HR 78 | Temp 97.3°F | Resp 20 | Ht 67.0 in | Wt 145.7 lb

## 2023-01-29 DIAGNOSIS — C61 Malignant neoplasm of prostate: Secondary | ICD-10-CM | POA: Diagnosis not present

## 2023-01-29 DIAGNOSIS — C787 Secondary malignant neoplasm of liver and intrahepatic bile duct: Secondary | ICD-10-CM | POA: Diagnosis not present

## 2023-01-29 DIAGNOSIS — C9000 Multiple myeloma not having achieved remission: Secondary | ICD-10-CM | POA: Diagnosis not present

## 2023-01-29 DIAGNOSIS — C674 Malignant neoplasm of posterior wall of bladder: Secondary | ICD-10-CM | POA: Insufficient documentation

## 2023-01-29 DIAGNOSIS — Z923 Personal history of irradiation: Secondary | ICD-10-CM | POA: Insufficient documentation

## 2023-01-29 DIAGNOSIS — C661 Malignant neoplasm of right ureter: Secondary | ICD-10-CM | POA: Diagnosis not present

## 2023-01-29 DIAGNOSIS — C689 Malignant neoplasm of urinary organ, unspecified: Secondary | ICD-10-CM

## 2023-01-29 MED ORDER — FLUCONAZOLE 150 MG PO TABS: 150.0000 mg | ORAL_TABLET | Freq: Every day | ORAL | 1 refills | Status: DC

## 2023-01-29 NOTE — Telephone Encounter (Signed)
Msg given to josh- he will call patient.

## 2023-01-29 NOTE — Telephone Encounter (Signed)
Received a call from patient and his wife.  Patient reports 2 to 3 days of recurrent redness/swelling of the head of the penis/foreskin with itching.  Patient was previously treated in January for balanitis with good response to fluconazole.  Patient says that he has the same symptoms as he had at that time and has requested refill of fluconazole.  They left a message for urology practice but reportedly there was no one available to see patient sooner than his scheduled appointment on Wednesday with Dr. Bernardo Heater.  Agreed to refill fluconazole in interim until patient can be seen by urology.

## 2023-01-29 NOTE — Progress Notes (Signed)
Radiation Oncology Follow up Note  Name: [name] [name]   Date:   01/29/2023 MRN:  [id number] DOB: 10/31/1941    This 82 y.o. 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  Sents to the clinic today for .  REFERRING PROVIDER: JBaxter Hire MD  HPI: Patient is an 82year old male with multiple medical conditions transpiring at the present time including metastatic prostate cancer bladder cancer multiple myeloma..  His disease has progressed with widespread liver metastasis castrate resistant prostate cancer currently on Eligard which began in December.  Recent PSMA PET scan showed metastatic disease within the pelvis peritoneal implants large hepatic metastasis multifocal pleural metastasis with no clear evidence of skeletal metastasis.  He currently is under Taxol treatment [facility].  Some of his urologic symptoms have been improved with the Uribel provided by urology.  He is having no bone pain at this time.  COMPLICATIONS OF TREATMENT: none  FOLLOW UP COMPLIANCE: keeps appointments   PHYSICAL EXAM:  BP (!) 132/57 (BP Location: Right Arm, Patient Position: Sitting, Cuff Size: Normal)   Pulse 78   Temp (!) 97.3 F (36.3 C) (Tympanic)   Resp 20   Ht '5\' 7"'$  (1.702 m) Comment: stated HT  Wt 145 lb 11.2 oz (66.1 kg)   BMI 22.82 kg/m  Frail-appearing male in NAD.  Well-developed well-nourished patient in NAD. HEENT reveals PERLA, EOMI, discs not visualized.  Oral cavity is clear. No oral mucosal lesions are identified. Neck is clear without evidence of cervical or supraclavicular adenopathy. Lungs are clear to A&P. Cardiac examination is essentially unremarkable with regular rate and rhythm without murmur rub or thrill. Abdomen is benign with no organomegaly or masses noted. Motor sensory  and DTR levels are equal and symmetric in the upper and lower extremities. Cranial nerves II through XII are grossly intact. Proprioception is intact. No peripheral adenopathy or edema is identified. No motor or sensory levels are noted. Crude visual fields are within normal range.  RADIOLOGY RESULTS: PSA PET scan reviewed compatible with above-stated findings  PLAN: At this time I see no rationale for pelvic palliative radiation therapy at this time.  He has no bony involvement.  Should he need palliative radiation therapy will be happy to reevaluate him in the near future.  He continues on his systemic treatment through medical oncology.  I will turn follow-up care over to medical oncology.  Again be happy to reevaluate him should that be indicated.  I would like to take this opportunity to thank you for allowing me to participate in the care of your patient..Noreene Filbert MD

## 2023-01-29 NOTE — Telephone Encounter (Signed)
Pt's wife called back and states that an NP at the cancer center will being sending in RX for yeast, the do not need [place] to send.

## 2023-01-29 NOTE — Telephone Encounter (Signed)
Mrs Jolliffe called stating patient started on Uribel 1 month ago. This resolved his penile/pelvic pain and he was doing great. About 2 weeks in though he developed tongue swelling, dark red color to the tongue and thrush feeling in his mouth. He stopped the medication for about 4 to 5 days and symptoms resolved but then the pain came back so patient started back on Uribel the last 5 days. Again is having same symptoms of swelling of the tongue, redness of the tongue, some pain, he is also having swelling and stiffness of the penis and the skin around it (he is not circumcised). NO difficulty breathing. Mrs Economou states last time patient's penis had this issue they saw Sam and Fluconazole was given for yeats infection. Patient does not have follow up with Dr Bernardo Heater on 01/31/23 but Mrs Bozzi does not think this can wait and would like to get the yeast problem treated. We do not have any openings in the office today or tomorrow.

## 2023-01-29 NOTE — Telephone Encounter (Signed)
-----   Message from Lindell Noe sent at 01/29/2023 10:28 AM EST ----- Regarding: Patient would like Josh to cal Pt came up after his appt with Dr. Baruch Gouty stating that he had some things he wanted to talk to [facility] about. He asked for Josh to call him at his home number when he has the chance

## 2023-01-31 ENCOUNTER — Ambulatory Visit (INDEPENDENT_AMBULATORY_CARE_PROVIDER_SITE_OTHER): Payer: Medicare Other | Admitting: Urology

## 2023-01-31 ENCOUNTER — Encounter: Payer: Self-pay | Admitting: Urology

## 2023-01-31 VITALS — BP 142/57 | HR 72 | Ht 67.0 in | Wt 144.0 lb

## 2023-01-31 DIAGNOSIS — C61 Malignant neoplasm of prostate: Secondary | ICD-10-CM

## 2023-01-31 DIAGNOSIS — C787 Secondary malignant neoplasm of liver and intrahepatic bile duct: Secondary | ICD-10-CM

## 2023-01-31 DIAGNOSIS — R3915 Urgency of urination: Secondary | ICD-10-CM

## 2023-01-31 DIAGNOSIS — R350 Frequency of micturition: Secondary | ICD-10-CM

## 2023-01-31 DIAGNOSIS — C669 Malignant neoplasm of unspecified ureter: Secondary | ICD-10-CM

## 2023-01-31 DIAGNOSIS — R102 Pelvic and perineal pain: Secondary | ICD-10-CM

## 2023-01-31 LAB — BLADDER SCAN AMB NON-IMAGING

## 2023-01-31 NOTE — Progress Notes (Unsigned)
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   HPI: 82 y.o. male presents for a 1 month follow-up.  Refer to my previous note 12/29/2022 Trospium and Uribel were added at that visit.  He noted almost complete resolution of his symptoms with Uribel however after taking for approximately 1 week he began to note swelling of his tongue and redness/irritation of his buccal area.  He stopped the medication x 4 days with improvement in the oral irritation however his pelvic/voiding symptoms returned Recently had some redness and irritation of the foreskin and was started on Diflucan by oncology Notes occasional passing of flesh colored material in his urine Occasional gross hematuria Is tentatively scheduled for stent exchange at [facility] in April 2024 and he is requesting stent exchange here Has noted increased constipation which she feels is related to some of his meds Currently undergoing chemotherapy in oncology   PMH: Past Medical History:  Diagnosis Date   Age related osteoporosis    Anemia    Barrett's  esophagus    Bradycardia    Cancer (HCC)    PROSTATE   Cataract    CKD (chronic kidney disease) stage 3, GFR 30-59 ml/min (HCC)    Colon polyps    DDD (degenerative disc disease), cervical    DDD (degenerative disc disease), lumbar    Femur fracture (HCC)    Right   Fundic gland polyposis of stomach    Fundic gland polyps of stomach, benign    Gastritis    GERD (gastroesophageal reflux disease)    Hematuria    Hemorrhage of rectum and anus    History of colon polyps    Insomnia    Lumbago    Osteopenia of the elderly    Skin cancer    Sleep apnea     Surgical History: Past Surgical History:  Procedure Laterality Date   BAND HEMORRHOIDECTOMY     COLONOSCOPY     COLONOSCOPY WITH PROPOFOL N/A 05/06/2018   Procedure: COLONOSCOPY WITH PROPOFOL;  Surgeon: Lollie Sails, MD;  Location: [facility] ENDOSCOPY;  Service: Endoscopy;  Laterality: N/A;   COLONOSCOPY WITH PROPOFOL N/A 09/23/2018   Procedure: COLONOSCOPY WITH PROPOFOL;  Surgeon: Lollie Sails, MD;  Location: [facility] ENDOSCOPY;  Service: Endoscopy;  Laterality: N/A;   COLONOSCOPY WITH PROPOFOL N/A 03/30/2021   Procedure: COLONOSCOPY WITH PROPOFOL;  Surgeon: Toledo, Benay Pike, MD;  Location: [facility] ENDOSCOPY;  Service: Gastroenterology;  Laterality: N/A;   CYSTOSCOPY W/ URETERAL STENT PLACEMENT Right 03/14/2022   Procedure: CYSTOSCOPY WITH RETROGRADE PYELOGRAM/URETERAL STENT PLACEMENT;  Surgeon: Abbie Sons, MD;  Location: [facility] ORS;  Service: Urology;  Laterality: Right;   ESOPHAGOGASTRODUODENOSCOPY (EGD) WITH PROPOFOL N/A 10/03/2016   Procedure: ESOPHAGOGASTRODUODENOSCOPY (EGD)  WITH PROPOFOL;  Surgeon: Lollie Sails, MD;  Location[facility] ENDOSCOPY;  Service: Endoscopy;  Laterality: N/A;   EYE SURGERY     CATARACTS   EYELID SURGERY     FLEXIBLE SIGMOIDOSCOPY     FRACTURE SURGERY Right 2016   femur   FRACTURE SURGERY     ORIF DISTAL FEMUR FRACTURE     TONSILLECTOMY     TRANSURETHRAL RESECTION OF BLADDER TUMOR N/A  03/14/2022   Procedure: TRANSURETHRAL RESECTION OF BLADDER TUMOR (TURBT);  Surgeon: Abbie Sons, MD;  Location: [facility] ORS;  Service: Urology;  Laterality: N/A;    Home Medications:  Allergies as of 01/31/2023       Reactions   Ciprofloxacin Other (See Comments)   Yeast infection   Demerol [meperidine Hcl] Other (See Comments)   "I pass out"   Gabapentin Hives   Prolia [denosumab] Other (See Comments)   Pain and vision loss. Pt requesting to add as an allergy.    Demerol [meperidine] Nausea And Vomiting, Other (See Comments)   Patient passed out.   Nsaids Other (See Comments)   Not supposed to take NSAIDS per Dr Gustavo Lah due to [facility] Esophagus history Per Nephrologist, not to take NSAIDS        Medication List        Accurate as of January 31, 2023 10:38 AM. If you have any questions, ask your nurse or doctor.          acyclovir 200 MG capsule Commonly known as: ZOVIRAX Take 1 capsule (200 mg total) by mouth 2 (two) times daily.   CALCIUM 500 PO Take 1 tablet by mouth daily.   dexamethasone 4 MG tablet Commonly known as: DECADRON Take 4 mg by mouth 2 (two) times daily with a meal. Sig: Take 2 tabs by mouth 2 times daily starting day before chemo. Then take 2 tabs daily for 2 days starting day after chemo. Take with food.   ferrous sulfate 325 (65 FE) MG tablet Take 325 mg by mouth daily.   fluconazole 150 MG tablet Commonly known as: DIFLUCAN Take 1 tablet (150 mg total) by mouth daily.   Gemtesa 75 MG Tabs Generic drug: Vibegron Take 75 mg by mouth daily.   HYDROcodone-acetaminophen 5-325 MG tablet Commonly known as: Norco Take 1 tablet by mouth every 6 (six) hours as needed for moderate pain.   ipratropium 0.03 % nasal spray Commonly known as: ATROVENT Place 1 spray into both nostrils in the morning.   losartan 25 MG tablet Commonly known as: COZAAR Take 25 mg by mouth daily.   melatonin 5 MG Tabs Take 5 mg by mouth at bedtime.   Mens 50+  Multivitamin Tabs Take 1 tablet by mouth daily.   nystatin cream Commonly known as: MYCOSTATIN Apply 1 Application topically 2 (two) times daily.   senna 8.6 MG Tabs tablet Commonly known as: SENOKOT Take 2 tablets (17.2 mg total) by mouth daily.   tamsulosin 0.4 MG Caps capsule Commonly known as: FLOMAX Take 1 capsule by mouth daily.   traZODone 50 MG tablet Commonly known as: DESYREL Take 25 mg by mouth at bedtime.   trospium 20 MG tablet Commonly known as: SANCTURA Take 1 tablet (20 mg total) by mouth 2 (two) times daily.   Uribel 118 MG Caps Take 1 capsule (118 mg total) by mouth 3 (three) times daily as needed.   Vitamin D3 125 MCG (5000 UT) capsule Generic drug: Cholecalciferol Take 5,000 Units by mouth daily.  Allergies:  Allergies  Allergen Reactions   Ciprofloxacin Other (See Comments)    Yeast infection   Demerol [Meperidine Hcl] Other (See Comments)    "I pass out"   Gabapentin Hives   Prolia [Denosumab] Other (See Comments)    Pain and vision loss. Pt requesting to add as an allergy.    Demerol [Meperidine] Nausea And Vomiting and Other (See Comments)    Patient passed out.   Nsaids Other (See Comments)    Not supposed to take NSAIDS per Dr Gustavo Lah due to Barrett's Esophagus history Per Nephrologist, not to take NSAIDS    Family History: Family History  Problem Relation Age of Onset   Breast cancer Mother        dx 62s-50s   Prostate cancer Paternal Grandfather 37   Breast cancer Cousin     Social History:  reports that he quit smoking about 48 years ago. His smoking use included cigarettes. He has a 15.00 pack-year smoking history. He has been exposed to tobacco smoke. He has never used smokeless tobacco. He reports that he does not currently use alcohol after a past usage of about 1.0 standard drink of alcohol per week. He reports that he does not use drugs.   Physical Exam: BP (!) 142/57   Pulse 72   Ht '5\' 7"'$  (1.702 m)   Wt 144  lb (65.3 kg)   BMI 22.55 kg/m   Constitutional:  Alert and oriented, No acute distress. HEENT: [place] AT Cardiovascular: No clubbing, cyanosis, or edema. Psychiatric: Normal mood and affect.   Assessment & Plan:   Marked improvement in pelvic symptoms on Uribel however having side effects.  He has been off the medication x 4 days and will have him start back at 1 tab daily to see if this helps symptoms but lessen side effects.  He will titrate up to 3 tabs per day Does not feel he has had significant benefit with trospium.  We did discuss constipation is common side effect this medication and he will discontinue He is presently on tamsulosin and has run out of Gemtesa samples Will schedule stent exchange in April 2024 per his request ***Message Benard Halsted, MD  [facility] [address] [place], [place] [address] [phone]

## 2023-02-01 ENCOUNTER — Telehealth: Payer: Self-pay

## 2023-02-01 ENCOUNTER — Other Ambulatory Visit: Payer: Medicare Other

## 2023-02-01 ENCOUNTER — Other Ambulatory Visit: Payer: Self-pay | Admitting: Urology

## 2023-02-01 ENCOUNTER — Ambulatory Visit: Payer: Medicare Other | Admitting: Oncology

## 2023-02-01 ENCOUNTER — Ambulatory Visit: Payer: Medicare Other

## 2023-02-01 DIAGNOSIS — N1330 Unspecified hydronephrosis: Secondary | ICD-10-CM

## 2023-02-01 NOTE — Progress Notes (Signed)
Surgical Physician Order Form [facility]  * Scheduling expectation :  April 16 or 23rd 2024 2024  *Length of Case: 30 minutes  *Clearance needed: no  *Anticoagulation Instructions: N/A  *Aspirin Instructions: N/A  *Post-op visit Date/Instructions:  1 month follow up  *Diagnosis: Right Hydronephrosis  *Procedure: Right  Cysto w/stent exchange [id number])   Additional orders: N/A  -Admit type: OUTpatient  -Anesthesia: Choice  -VTE Prophylaxis Standing Order SCD's       Other:   -Standing Lab Orders Per Anesthesia    Lab other: UA&Urine Culture  -Standing Test orders EKG/Chest x-ray per Anesthesia       Test other:   - Medications:  Ceftriaxone(Rocephin) 1gm IV  -Other orders:  N/A

## 2023-02-01 NOTE — Telephone Encounter (Signed)
I spoke with [name] [name]. We have discussed possible surgery dates and Tuesday April 16th, 2024 was agreed upon by all parties. Patient given information about surgery date, what to expect pre-operatively and post operatively.  We discussed that a Pre-Admission Testing office will be calling to set up the pre-op visit that will take place prior to surgery, and that these appointments are typically done over the phone with a Pre-Admissions RN. Informed patient that our office will communicate any additional care to be provided after surgery. Patients questions or concerns were discussed during our call. Advised to call our office should there be any additional information, questions or concerns that arise. Patient verbalized understanding.

## 2023-02-01 NOTE — Progress Notes (Signed)
   [facility] From  Surgery Date: Date: 03/20/2023  Surgeon: Dr. John Giovanni, MD  Inpt ( No  )   Outpt (Yes)   Obs ( No  )   Diagnosis: N13.30 Right Hydronephrosis  -CPT: [id number]  Surgery: Right Cystoscopy with Stent Exchange  Stop Anticoagulations: N/A  Cardiac/Medical/Pulmonary Clearance needed: no  *Orders entered into EPIC  Date: 02/01/23   *Case booked in EPIC  Date: 02/01/23  *Notified pt of Surgery: Date: 02/01/23  PRE-OP UA & CX: yes, will obtain in clinic on 03/08/2023  *Placed into Prior Authorization Work Fabio Bering Date: 02/01/23  Assistant/laser/rep:No

## 2023-02-02 ENCOUNTER — Other Ambulatory Visit: Payer: Self-pay

## 2023-02-03 ENCOUNTER — Other Ambulatory Visit: Payer: Self-pay | Admitting: Urology

## 2023-02-13 MED FILL — Dexamethasone Sodium Phosphate Inj 100 MG/10ML: INTRAMUSCULAR | Qty: 1 | Status: AC

## 2023-02-14 ENCOUNTER — Inpatient Hospital Stay: Payer: Medicare Other | Attending: Oncology

## 2023-02-14 ENCOUNTER — Telehealth: Payer: Self-pay

## 2023-02-14 ENCOUNTER — Inpatient Hospital Stay: Payer: Medicare Other

## 2023-02-14 ENCOUNTER — Encounter: Payer: Self-pay | Admitting: Oncology

## 2023-02-14 ENCOUNTER — Inpatient Hospital Stay (HOSPITAL_BASED_OUTPATIENT_CLINIC_OR_DEPARTMENT_OTHER): Payer: Medicare Other | Admitting: Oncology

## 2023-02-14 ENCOUNTER — Ambulatory Visit
Admission: RE | Admit: 2023-02-14 | Discharge: 2023-02-14 | Disposition: A | Discharge status: Home / Self Care | Payer: Self-pay | Source: Ambulatory Visit | Attending: Oncology | Admitting: Oncology

## 2023-02-14 VITALS — BP 125/43 | HR 49 | Temp 96.7°F | Wt 148.6 lb

## 2023-02-14 DIAGNOSIS — C61 Malignant neoplasm of prostate: Secondary | ICD-10-CM

## 2023-02-14 DIAGNOSIS — Z803 Family history of malignant neoplasm of breast: Secondary | ICD-10-CM | POA: Insufficient documentation

## 2023-02-14 DIAGNOSIS — Z5111 Encounter for antineoplastic chemotherapy: Secondary | ICD-10-CM

## 2023-02-14 DIAGNOSIS — N184 Chronic kidney disease, stage 4 (severe): Secondary | ICD-10-CM | POA: Insufficient documentation

## 2023-02-14 DIAGNOSIS — C669 Malignant neoplasm of unspecified ureter: Secondary | ICD-10-CM

## 2023-02-14 DIAGNOSIS — C787 Secondary malignant neoplasm of liver and intrahepatic bile duct: Secondary | ICD-10-CM | POA: Diagnosis not present

## 2023-02-14 DIAGNOSIS — D631 Anemia in chronic kidney disease: Secondary | ICD-10-CM | POA: Diagnosis not present

## 2023-02-14 DIAGNOSIS — Z8042 Family history of malignant neoplasm of prostate: Secondary | ICD-10-CM | POA: Insufficient documentation

## 2023-02-14 DIAGNOSIS — C9000 Multiple myeloma not having achieved remission: Secondary | ICD-10-CM

## 2023-02-14 DIAGNOSIS — C679 Malignant neoplasm of bladder, unspecified: Secondary | ICD-10-CM | POA: Diagnosis not present

## 2023-02-14 DIAGNOSIS — D649 Anemia, unspecified: Secondary | ICD-10-CM | POA: Diagnosis not present

## 2023-02-14 DIAGNOSIS — G893 Neoplasm related pain (acute) (chronic): Secondary | ICD-10-CM | POA: Insufficient documentation

## 2023-02-14 DIAGNOSIS — Z87891 Personal history of nicotine dependence: Secondary | ICD-10-CM | POA: Diagnosis not present

## 2023-02-14 DIAGNOSIS — C782 Secondary malignant neoplasm of pleura: Secondary | ICD-10-CM | POA: Insufficient documentation

## 2023-02-14 LAB — COMPREHENSIVE METABOLIC PANEL
ALT: 14 U/L (ref 0–44)
AST: 21 U/L (ref 15–41)
Albumin: 3.6 g/dL (ref 3.5–5.0)
Alkaline Phosphatase: 82 U/L (ref 38–126)
Anion gap: 8 (ref 5–15)
BUN: 46 mg/dL — ABNORMAL HIGH (ref 8–23)
CO2: 24 mmol/L (ref 22–32)
Calcium: 8.9 mg/dL (ref 8.9–10.3)
Chloride: 105 mmol/L (ref 98–111)
Creatinine, Ser: 2.26 mg/dL — ABNORMAL HIGH (ref 0.61–1.24)
GFR, Estimated: 28 mL/min — ABNORMAL LOW (ref >60)
Glucose, Bld: 138 mg/dL — ABNORMAL HIGH (ref 70–99)
Potassium: 4.4 mmol/L (ref 3.5–5.1)
Sodium: 137 mmol/L (ref 135–145)
Total Bilirubin: 0.5 mg/dL (ref 0.3–1.2)
Total Protein: 5.8 g/dL — ABNORMAL LOW (ref 6.5–8.1)

## 2023-02-14 LAB — CBC WITH DIFFERENTIAL/PLATELET
Abs Immature Granulocytes: 0.09 10*3/uL — ABNORMAL HIGH (ref 0.00–0.07)
Basophils Absolute: 0 10*3/uL (ref 0.0–0.1)
Basophils Relative: 0 %
Eosinophils Absolute: 0 10*3/uL (ref 0.0–0.5)
Eosinophils Relative: 0 %
HCT: 29.1 % — ABNORMAL LOW (ref 39.0–52.0)
Hemoglobin: 9.4 g/dL — ABNORMAL LOW (ref 13.0–17.0)
Immature Granulocytes: 1 %
Lymphocytes Relative: 5 %
Lymphs Abs: 0.7 10*3/uL (ref 0.7–4.0)
MCH: 32.3 pg (ref 26.0–34.0)
MCHC: 32.3 g/dL (ref 30.0–36.0)
MCV: 100 fL (ref 80.0–100.0)
Monocytes Absolute: 0.8 10*3/uL (ref 0.1–1.0)
Monocytes Relative: 6 %
Neutro Abs: 11.2 10*3/uL — ABNORMAL HIGH (ref 1.7–7.7)
Neutrophils Relative %: 88 %
Platelets: 245 10*3/uL (ref 150–400)
RBC: 2.91 MIL/uL — ABNORMAL LOW (ref 4.22–5.81)
RDW: 17.5 % — ABNORMAL HIGH (ref 11.5–15.5)
WBC: 12.7 10*3/uL — ABNORMAL HIGH (ref 4.0–10.5)
nRBC: 0 % (ref 0.0–0.2)

## 2023-02-14 LAB — PSA: Prostatic Specific Antigen: 60.5 ng/mL — ABNORMAL HIGH (ref 0.00–4.00)

## 2023-02-14 NOTE — Telephone Encounter (Signed)
Request entered for [facility] PET scan images from 02/09/23

## 2023-02-14 NOTE — Assessment & Plan Note (Addendum)
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  Patient has received 3 cycles of docetaxel.  PSA has not responded well, this is a possible refractory case Today's PSA is pending.  Reviewed PET scan results from [facility] on 02/09/2023, he has developed a new liver lesions, pleural lesions and splenic lesions likely he has had disease progression, the question is whether this is progression of prostate cancer versus urothelial cancer. I plan to discuss with oncologist at [facility] Dr. Lossie Faes.

## 2023-02-14 NOTE — Assessment & Plan Note (Addendum)
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  Expect that his myeloma will progress in the future. Currently, will prioritize his treatment for prostate cancer Continue Acyclovir 200mg  BID  -Bone health, currently he does not have an active myeloma bone involvement.  Patient reports history of developing adverse reaction [vision loss] after Prolia treatments. -s/p  root canal. ophthalmologist clearance obtained for starting Xgeva. Hold Xgeva, he will need tooth extraction.

## 2023-02-14 NOTE — Assessment & Plan Note (Signed)
Chemotherapy plan as listed above 

## 2023-02-14 NOTE — Assessment & Plan Note (Signed)
He has Percocet, not taking any right now.   

## 2023-02-14 NOTE — Progress Notes (Signed)
Hematology/Oncology Progress note [phone] [phone]    CHIEF COMPLAINTS/REASON FOR VISIT:  Multiple myeloma, high-grade urothelial carcinoma.metastatic prostate cancer.   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  Patient has received 3 cycles of docetaxel.  PSA has not responded well, this is a possible refractory case Today's PSA is pending.  Reviewed PET scan results from [facility] on 02/09/2023, he has developed a new liver lesions, pleural lesions and splenic lesions likely he has had disease progression, the question is whether this is progression of prostate cancer versus urothelial cancer. I plentifully discussed with patient oncologist at [facility] Dr. Lossie Faes.    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  10/31/2022 repeat cystoscopy did not reveal any recurrence. - He has ureter stent, follow up with Urology for repeat cystoscopy.Marland Kitchen   Beryle Flock and Padcev if recurrence develops.   Anemia Hemoglobin has decreased. Multi factorial.  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    Multiple myeloma (HCC) #Stage III IgA kappa multiple myeloma, myeloma FISH panel positive for 13q deletion, gain of 1q, t (4;14), high risk, not candidate for autologous bone marrow transplant. 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  Expect that his myeloma will progress in the future. Currently, will prioritize his treatment for prostate cancer Continue Acyclovir '200mg'$  BID  -Bone health, currently he does not have an active myeloma bone involvement.  Patient reports history of developing adverse reaction [vision loss] after Prolia treatments. -s/p  root canal. ophthalmologist clearance obtained for starting Xgeva. Hold Xgeva, he will need tooth extraction.  Encounter for antineoplastic chemotherapy Chemotherapy plan as listed above  Neoplasm related pain He has Percocet, not taking any right now.    Stage 4 chronic kidney disease ([place]) Encourage oral hydration and avoid nephrotoxins.      Follow-up to be determined. All questions were answered. The patient knows to call the clinic with any problems, questions or concerns.  [name], MD, PhD [facility] Hematology Oncology 02/14/2023      HISTORY OF PRESENTING ILLNESS:  [name] [name] is a 82 y.o. male presents for follow-up of multiple myeloma and high-grade urothelial carcinoma, and  metastatic castration  resistant prostate cancer.   Oncology History  Multiple myeloma ([place])  03/02/2022 Imaging   PET showed No definitive signs of multiple myeloma by FDG PET.   Obstruction of the RIGHT ureter with soft tissue mass in the RIGHT pelvis showing increased metabolic activity suspicious first and foremost for urothelial neoplasm given location adjacent to RIGHT UVJ and affect upon the RIGHT ureter.   Little or no excretion of FDG on the RIGHT but still with uptake of FDG by renal parenchyma. Degree of hydronephrosis is not substantially changed but lack of FDG excretion on the RIGHT is compatible with secondary sign of physiologic significance of ureteral obstruction.   RIGHT pelvic sidewall uptake without visible lesion, could represent tiny lymph node not visible on noncontrast imaging.   Aortic atherosclerosis.  Pulmonary emphysema. Aortic Atherosclerosis and Emphysema    03/17/2022 Initial Diagnosis   Multiple myeloma ([place]) -Patient has progressively worsening kidney function.  He is scheduled for kidney biopsy. 01/26/2022, free kappa light chain 1058, lambda 15.3, light chain ratio 69. Protein electrophoresis showed restricted band-M spike migrating in the beta-1 globulin region. ANA negative.  ANCA negative. Random urine protein electrophoresis showed abnormal protein band detected in the gamma globulin And a second possible abnormal protein band that may represent monoclonal protein.   02/23/2022 multiple myeloma showed IgA 2883, M protein of 1.8, beta 2 microglobulin 6.9, kappa free light chain 1268.9, lambda 13.2, free light chain ratio 96.13.   CMP showed creatinine of 3.41, that is significantly worse than his baseline in June 2022. 02/28/2022, 24-hour urine protein electrophoresis showed M protein of 729 mg, IgA monoclonal kappa light chain. 03/01/2022, [facility] kidney biopsy showed diffuse light chain tubulopathy, and light chain cast nephropathy, moderate interstitial  fibrosis,4% global glomerular sclerosis and the marked atherosclerosis.  03/06/2022, patient underwent a bone marrow biopsy. Pathology showed hypercellular bone marrow with plasma cell neoplasm, representing 20% of all cells in the aspirate associated with prominent interstitial infiltrates and numerous predominantly small clusters in the clots in the biopsy sections.  The plasma cells display kappa light chain restriction consistent with plasma cell neoplasm. Normal cytogenetics, myeloma FISH panel positive for 13q deletion, gain of 1q, t (4;14)   03/17/2022 Cancer Staging   Staging form: Plasma Cell Myeloma and Plasma Cell Disorders, AJCC 8th Edition - Clinical stage from 03/17/2022: RISS Stage III (Beta-2-microglobulin (mg/L): 6.9, Albumin (g/dL): 3.3, ISS: Stage III, High-risk cytogenetics: Present, LDH: Normal) - Signed by [name], MD on 03/17/2022 Stage prefix: Initial diagnosis Beta 2 microglobulin range (mg/L): Greater than or equal to 5.5 Albumin range (g/dL): Less than 3.5 Cytogenetics: t(4;14) translocation, Other mutation, 1q addition   03/24/2022 - 07/19/2022 Chemotherapy   Patient is on Treatment Plan : MYELOMA NEWLY DIAGNOSED Daratumumab + Dexamethasone Weekly (DaraRd) q28d     09/21/2022 Imaging   MRI lumbar spine wo contrast 1. 6 mm nodule in the cauda equina at the L4-5 level. 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 Recommend MRI of the lumbar spine with contrast for further evaluation. 2. 12 mm T2 hyperintense lesion along the inferior aspect of the S1 vertebral body. This may represent focal involvement of multiple myeloma. No other discrete lesions are present. No pathologic fracture is present. 3. Mild bilateral foraminal stenosis at L3-4 and L4-5. 4. Left paramedian annular tear at L5-S1 near the left S1 nerve roots.   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   Obstruction of the RIGHT ureter with soft tissue mass in the RIGHT pelvis showing increased metabolic activity suspicious first and foremost for urothelial neoplasm given location adjacent to RIGHT UVJ and affect upon the RIGHT ureter.   Little or no excretion of FDG on the RIGHT but still with uptake of FDG by renal parenchyma. Degree of hydronephrosis is not substantially changed but lack of FDG excretion on the RIGHT is compatible with secondary sign of physiologic significance of ureteral obstruction.   RIGHT pelvic sidewall uptake without visible lesion, could represent tiny lymph node not visible on noncontrast imaging.   Aortic atherosclerosis.  Pulmonary emphysema. 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  Right ureteroscopy showed nodular tumor distal ureter and a convincing area without tumor suspicious for extrinsic obstruction.s/p   right ureteral stent placement. Ureteral tumor showed a tiny fragments of fibrous tissue, interpretation limited by thermal and crush artifact.  Right distal ureter saline barbotage showed hight grade urothelial carcinoma.    03/17/2022 Cancer Staging   Staging form: Renal Pelvis and Ureter, AJCC 8th Edition - Clinical: Stage Unknown (cTX, cN0, cM0) - Signed by [name], MD on 03/17/2022 Stage prefix: Initial diagnosis WHO/ISUP grade (low/high): High Grade Histologic grading system: 2 grade system   06/05/2022 Imaging   PET scan at [facility] showed 1.Interval placement of right-sided double-J ureteral stent with resolution  of hydronephrosis and improved excretion of radiotracer.  2.There is persistent abnormal hypermetabolic tissue extending posterior lateral from the right aspect of the bladder encasing the distal ureter/stent which measures approximately 5.1 x 4.2 (CT image 243).  3.There are similar linear areas of abnormal hypermetabolic tissue seen to extend more superiorly along the right posterior pelvic wall/peritoneum, and towards the sciatic notch (fused image 238). Linear configuration suggests possible perineural spread. Note that this soft tissue abuts and may encase pelvic vasculature including the external and internal iliac arteries.  4.NEW single subcentimeter but abnormal focus of FDG avidity in the right presacral region (CT image 231) measuring 1.6 cm representing new site of neoplastic disease   06/19/2022 - 08/10/2022 Radiation Therapy   Pelvis RT 06/19/22-08/21/22 Bladder RT 07/27/22- 08/20/22    09/18/2022 Imaging   PET scan at [facility] showed 1. Evidence of disease progression with multifocal new hypermetabolic lesions around the periphery of the liver as described above. The evaluation of these lesions is limited by significant misregistration between the PET and CT portions of the examination. Contrast-enhanced CT or MRI could be used for confirmation and/or guidance of biopsy as necessary. There is also a lung parenchymal nodule with new associated FDG uptake, also suspicious for a site of metastasis.   2. Although the right nephroureteral stent appears to be well positioned, the right kidney demonstrates slightly increased hydronephrosis with significant surrounding fat stranding and relatively delayed clearance of FDG. These findings could suggest stent malfunction or be related to upper urinary tract infection. Suggest clinical correlation with any patient signs or symptoms such as right flank pain, fever, and/or urinalysis findings.     09/21/2022 Imaging   MRI  lumbar spine wo contrast 1. 6 mm  nodule in the cauda equina at the L4-5 level. 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 Recommend MRI of the lumbar spine with contrast for further evaluation. 2. 12 mm T2 hyperintense lesion along the inferior aspect of the S1 vertebral body. This may represent focal involvement of multiple myeloma. No other discrete lesions are present. No pathologic fracture is present. 3. Mild bilateral foraminal stenosis at L3-4 and L4-5. 4. Left paramedian annular tear at L5-S1 near the left S1 nerve roots.    Genetic Testing   Negative genetic testing. No pathogenic variants identified on the [facility] Multi-Cancer+RNA panel. The report date is 11/19/2022.  The Multi-Cancer + RNA Panel offered by [facility] includes sequencing and/or deletion/duplication analysis of the following 70 genes:  AIP*, ALK, APC*, ATM*, AXIN2*, BAP1*, BARD1*, BLM*, BMPR1A*, BRCA1*, BRCA2*, BRIP1*, CDC73*, CDH1*, CDK4, CDKN1B*, CDKN2A, CHEK2*, CTNNA1*, DICER1*, EPCAM, EGFR, FH*, FLCN*, GREM1, HOXB13, KIT, LZTR1, MAX*, MBD4, MEN1*, MET, MITF, MLH1*, MSH2*, MSH3*, MSH6*, MUTYH*, NF1*, NF2*, NTHL1*, PALB2*, PDGFRA, PMS2*, POLD1*, POLE*, POT1*, PRKAR1A*, PTCH1*, PTEN*, RAD51C*, RAD51D*, RB1*, RET, SDHA*, SDHAF2*, SDHB*, SDHC*, SDHD*, SMAD4*, SMARCA4*, SMARCB1*, SMARCE1*, STK11*, SUFU*, TMEM127*, TP53*, TSC1*, TSC2*, VHL*. RNA analysis is performed for * genes.   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 Evidence of local prostate carcinoma within the pelvis with peritoneal implants along the bowel serosal  surface as well as a metastatic LEFT iliac lymph node. 2. 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 3. Multifocal pleural metastasis in the RIGHT hemithorax. 4. No clear evidence of skeletal metastasis      Genetic Testing   Negative genetic testing. No pathogenic variants identified on the [facility] Multi-Cancer+RNA panel. The report date is 11/19/2022.  The Multi-Cancer + RNA Panel offered by [facility] includes sequencing and/or deletion/duplication analysis of the following 70 genes:  AIP*, ALK, APC*, ATM*, AXIN2*, BAP1*, BARD1*, BLM*, BMPR1A*, BRCA1*, BRCA2*, BRIP1*, CDC73*, CDH1*, CDK4, CDKN1B*, CDKN2A, CHEK2*, CTNNA1*, DICER1*, EPCAM, EGFR, FH*, FLCN*, GREM1, HOXB13, KIT, LZTR1, MAX*, MBD4, MEN1*, MET, MITF, MLH1*, MSH2*, MSH3*, MSH6*, MUTYH*, NF1*, NF2*, NTHL1*, PALB2*, PDGFRA, PMS2*, POLD1*, POLE*, POT1*, PRKAR1A*, PTCH1*, PTEN*, RAD51C*, RAD51D*, RB1*, RET, SDHA*, SDHAF2*, SDHB*, SDHC*, SDHD*, SMAD4*, SMARCA4*, SMARCB1*, SMARCE1*, STK11*, SUFU*, TMEM127*, TP53*, TSC1*, TSC2*, VHL*. RNA analysis is performed for * genes.   02/09/2023 Imaging   PET scan at [facility] showed -Innumerable new hypermetabolic liver lesions, likely metastatic. In addition, there are new subpleural metastases and new right pleural effusion; new splenic lesion is also present.  -Focal uptake in the left basal ganglia; brain MR could be obtained if clinically indicated.  -Further decrease of excretion in the right kidney.  -Pelvic mass continues to decrease, but there is newly avid soft tissue in the pelvis near the left external iliac vessels.     11/27/22 UA showed RBC >50, moderate leukocytes, Urine culture negative.  Cipro did not provide him any relieve. Was seen by symptomatic management clinic and was prescribed a course steroid and Keflex.  11/28/22 CXR showed bronchitis - URI symptom has  improved.  11/29/22 US renal Mild right hydronephrosis and proximal hydroureter. Low level  echoes within the visualized proximal right ureter and renal pelvis,which may reflect blood products or debris.2. Stent is partially visualized within the urinary bladder. Right ureteral jet was not definitively seen.  He was seen by [facility] urology and his symptoms were felt to be due to post treatment inflammation. He was recommended to take Flomax and NSAIDS.  Due to his CKD, we recommend him not to take NSAIDS.  Today he is here for chemotherapy treatments.  He continues to have increased urine frequently, burning with urination, and doesn't feel like he is emptying well. Also swelling of penis.  We arranged him to be seen by [facility] urology team today and his symptoms was felt to be due to stent discomfort versus underlying malignancy and constipation may also contribute to his symtoms.  Marland Kitchen He was recommended to start [place]. Miralax for constipation.  Balanoposthitis, he will take one dose of diflucan.   INTERVAL HISTORY Adarryll Larrimore Russum is a 82 y.o. male who has above history reviewed by me today presents for follow up visit for multiple myeloma and high-grade urothelial carcinoma, metastatic prostate cancer.  +dysuria/urinary urgency symptoms are better.  Overall he tolerated  Docetaxel with GCSF support.  Denies fever, chills, nausea or vomiting.  Patient recently had PET scan at [facility] on 02/09/2023.  PET scan showed disease progression   Review of Systems  Constitutional:  Positive for fatigue. Negative for appetite change, chills, fever and unexpected weight change.  HENT:   Negative for hearing loss and voice change.   Eyes:  Negative for eye problems and icterus.  Respiratory:  Negative for chest tightness, cough and shortness of breath.   Cardiovascular:  Negative for chest pain and leg swelling.  Gastrointestinal:  Positive for constipation. Negative for abdominal distention, abdominal pain and diarrhea.  Endocrine: Negative for hot flashes.  Genitourinary:  Positive for dysuria and  frequency. Negative for difficulty urinating.   Musculoskeletal:  Negative for arthralgias.  Skin:  Negative for itching.  Neurological:  Negative for light-headedness and numbness.  Hematological:  Negative for adenopathy. Does not bruise/bleed easily.  Psychiatric/Behavioral:  Negative for confusion.      MEDICAL HISTORY:  Past Medical History:  Diagnosis Date   Age related osteoporosis    Anemia    Barrett's esophagus    Bradycardia    Cancer (HCC)    PROSTATE   Cataract    CKD (chronic kidney disease) stage 3, GFR 30-59 ml/min (HCC)    Colon polyps    DDD (degenerative disc disease), cervical    DDD (degenerative disc disease), lumbar    Femur fracture (HCC)    Right   Fundic gland polyposis of stomach    Fundic gland polyps of stomach, benign    Gastritis    GERD (gastroesophageal reflux disease)    Hematuria    Hemorrhage of rectum and anus    History of colon polyps    Insomnia    Lumbago    Osteopenia of the elderly    Skin cancer    Sleep apnea     SURGICAL HISTORY: Past Surgical History:  Procedure Laterality Date   BAND HEMORRHOIDECTOMY     COLONOSCOPY     COLONOSCOPY WITH PROPOFOL N/A 05/06/2018   Procedure: COLONOSCOPY WITH PROPOFOL;  Surgeon: Lollie Sails, MD;  Location[facility] ENDOSCOPY;  Service: Endoscopy;  Laterality: N/A;   COLONOSCOPY WITH PROPOFOL N/A 09/23/2018   Procedure: COLONOSCOPY  WITH PROPOFOL;  Surgeon: Lollie Sails, MD;  Location: [facility] ENDOSCOPY;  Service: Endoscopy;  Laterality: N/A;   COLONOSCOPY WITH PROPOFOL N/A 03/30/2021   Procedure: COLONOSCOPY WITH PROPOFOL;  Surgeon: Toledo, Benay Pike, MD;  Location: [facility] ENDOSCOPY;  Service: Gastroenterology;  Laterality: N/A;   CYSTOSCOPY W/ URETERAL STENT PLACEMENT Right 03/14/2022   Procedure: CYSTOSCOPY WITH RETROGRADE PYELOGRAM/URETERAL STENT PLACEMENT;  Surgeon: Abbie Sons, MD;  Location: [facility] ORS;  Service: Urology;  Laterality: Right;   ESOPHAGOGASTRODUODENOSCOPY (EGD)  WITH PROPOFOL N/A 10/03/2016   Procedure: ESOPHAGOGASTRODUODENOSCOPY (EGD) WITH PROPOFOL;  Surgeon: Lollie Sails, MD;  Location: [facility] ENDOSCOPY;  Service: Endoscopy;  Laterality: N/A;   EYE SURGERY     CATARACTS   EYELID SURGERY     FLEXIBLE SIGMOIDOSCOPY     FRACTURE SURGERY Right 2016   [place]   FRACTURE SURGERY     ORIF DISTAL FEMUR FRACTURE     TONSILLECTOMY     TRANSURETHRAL RESECTION OF BLADDER TUMOR N/A 03/14/2022   Procedure: TRANSURETHRAL RESECTION OF BLADDER TUMOR (TURBT);  Surgeon: Abbie Sons, MD;  Location: [facility] ORS;  Service: Urology;  Laterality: N/A;    SOCIAL HISTORY: Social History   Socioeconomic History   Marital status: Married    Spouse name: Not on file   Number of children: Not on file   Years of education: Not on file   Highest education level: Not on file  Occupational History   Not on file  Tobacco Use   Smoking status: Former    Packs/day: 1.00    Years: 15.00    Total pack years: 15.00    Types: Cigarettes    Quit date: 10/03/1974    Years since quitting: 48.4    Passive exposure: Past   Smokeless tobacco: Never  Vaping Use   Vaping Use: Never used  Substance and Sexual Activity   Alcohol use: Not Currently    Alcohol/week: 1.0 standard drink of alcohol    Types: 1 Cans of beer per week    Comment: 1 beer every 2 weeks   Drug use: No   Sexual activity: Yes    Birth control/protection: None  Other Topics Concern   Not on file  Social History Narrative   ** Merged History Encounter **       Social Determinants of Health   Financial Resource Strain: Medium Risk (01/22/2023)   Overall Financial Resource Strain (CARDIA)    Difficulty of Paying Living Expenses: Somewhat hard  Food Insecurity: No Food Insecurity (01/22/2023)   Hunger Vital Sign    Worried About Running Out of Food in the Last Year: Never true    Ran Out of Food in the Last Year: Never true  Transportation Needs: No Transportation Needs (01/22/2023)   PRAPARE -  Hydrologist (Medical): No    Lack of Transportation (Non-Medical): No  Physical Activity: Inactive (01/22/2023)   Exercise Vital Sign    Days of Exercise per Week: 0 days    Minutes of Exercise per Session: 0 min  Stress: Stress Concern Present (01/22/2023)   [place]    Feeling of Stress : To some extent  Social Connections: Socially Integrated (01/22/2023)   Social Connection and Isolation Panel [NHANES]    Frequency of Communication with Friends and Family: More than three times a week    Frequency of Social Gatherings with Friends and Family: More than three times a week  Attends Religious Services: 1 to 4 times per year    Active Member of Clubs or Organizations: No    Attends Archivist Meetings: 1 to 4 times per year    Marital Status: Married  Human resources officer Violence: Not At Risk (01/22/2023)   Humiliation, Afraid, Rape, and Kick questionnaire    Fear of Current or Ex-Partner: No    Emotionally Abused: No    Physically Abused: No    Sexually Abused: No    FAMILY HISTORY: Family History  Problem Relation Age of Onset   Breast cancer Mother        dx 22s-50s   Prostate cancer Paternal Grandfather 57   Breast cancer Cousin     ALLERGIES:  is allergic to ciprofloxacin, demerol [meperidine hcl], gabapentin, prolia [denosumab], demerol [meperidine], and nsaids.  MEDICATIONS:  Current Outpatient Medications  Medication Sig Dispense Refill   acyclovir (ZOVIRAX) 200 MG capsule Take 1 capsule (200 mg total) by mouth 2 (two) times daily. 60 capsule 3   Calcium Carbonate (CALCIUM 500 PO) Take 1 tablet by mouth daily.     Cholecalciferol (VITAMIN D3) 125 MCG (5000 UT) CAPS Take 5,000 Units by mouth daily.     dexamethasone (DECADRON) 4 MG tablet Take 4 mg by mouth 2 (two) times daily with a meal. Sig: Take 2 tabs by mouth 2 times daily starting day before chemo. Then take 2  tabs daily for 2 days starting day after chemo. Take with food.     ferrous sulfate 325 (65 FE) MG tablet Take 325 mg by mouth daily.     fluconazole (DIFLUCAN) 150 MG tablet Take 1 tablet (150 mg total) by mouth daily. 1 tablet 1   HYDROcodone-acetaminophen (NORCO) 5-325 MG tablet Take 1 tablet by mouth every 6 (six) hours as needed for moderate pain. 30 tablet 0   ipratropium (ATROVENT) 0.03 % nasal spray Place 1 spray into both nostrils in the morning.     losartan (COZAAR) 25 MG tablet Take 25 mg by mouth daily.     melatonin 5 MG TABS Take 5 mg by mouth at bedtime.     Meth-Hyo-M Bl-Na Phos-Ph Sal (URIBEL) 118 MG CAPS Take 1 capsule (118 mg total) by mouth 3 (three) times daily as needed. 30 capsule 3   Multiple Vitamins-Minerals (MENS 50+ MULTIVITAMIN) TABS Take 1 tablet by mouth daily.     senna (SENOKOT) 8.6 MG TABS tablet Take 2 tablets (17.2 mg total) by mouth daily. 120 tablet 0   tamsulosin (FLOMAX) 0.4 MG CAPS capsule Take 1 capsule by mouth daily.     traZODone (DESYREL) 50 MG tablet Take 25 mg by mouth at bedtime.     trospium (SANCTURA) 20 MG tablet Take 1 tablet (20 mg total) by mouth 2 (two) times daily. 60 tablet 0   nystatin cream (MYCOSTATIN) Apply 1 Application topically 2 (two) times daily. (Patient not taking: Reported on 02/14/2023) 30 g 0   Vibegron (GEMTESA) 75 MG TABS Take 75 mg by mouth daily. (Patient not taking: Reported on 02/14/2023) 28 tablet 0   No current facility-administered medications for this visit.     PHYSICAL EXAMINATION: ECOG PERFORMANCE STATUS: 1 - Symptomatic but completely ambulatory Today's Vitals   02/14/23 0901  BP: (!) 125/43  Pulse: (!) 49  Temp: (!) 96.7 F (35.9 C)  Weight: 148 lb 9.6 oz (67.4 kg)  PainSc: 0-No pain   Body mass index is 23.27 kg/m.   Physical Exam Constitutional:  General: He is not in acute distress. HENT:     Head: Normocephalic and atraumatic.  Eyes:     General: No scleral icterus. Cardiovascular:      Rate and Rhythm: Normal rate.  Pulmonary:     Effort: Pulmonary effort is normal. No respiratory distress.     Breath sounds: No wheezing.  Abdominal:     General: There is no distension.     Palpations: Abdomen is soft.  Musculoskeletal:        General: No deformity. Normal range of motion.     Cervical back: Normal range of motion and neck supple.  Skin:    General: Skin is warm and dry.     Findings: No erythema.  Neurological:     Mental Status: He is alert and oriented to person, place, and time. Mental status is at baseline.     Cranial Nerves: No cranial nerve deficit.  Psychiatric:        Mood and Affect: Mood normal.      LABORATORY DATA:  I have reviewed the data as listed    Latest Ref Rng & Units 02/14/2023    8:33 AM 01/24/2023    8:35 AM 01/17/2023    8:53 AM  CBC  WBC 4.0 - 10.5 K/uL 12.7  10.8  10.3   Hemoglobin 13.0 - 17.0 g/dL 9.4  9.9  9.9   Hematocrit 39.0 - 52.0 % 29.1  30.5  30.7   Platelets 150 - 400 K/uL 245  273  133        Latest Ref Rng & Units 02/14/2023    8:33 AM 01/24/2023    8:35 AM 01/03/2023    8:40 AM  CMP  Glucose 70 - 99 mg/dL 138  139  138   BUN 8 - 23 mg/dL 46  35  47   Creatinine 0.61 - 1.24 mg/dL 2.26  2.38  2.44   Sodium 135 - 145 mmol/L 137  138  137   Potassium 3.5 - 5.1 mmol/L 4.4  5.0  4.1   Chloride 98 - 111 mmol/L 105  106  103   CO2 22 - 32 mmol/L '24  21  24   '$ Calcium 8.9 - 10.3 mg/dL 8.9  9.0  8.9   Total Protein 6.5 - 8.1 g/dL 5.8  5.9  5.7   Total Bilirubin 0.3 - 1.2 mg/dL 0.5  0.3  0.5   Alkaline Phos 38 - 126 U/L 82  79  79   AST 15 - 41 U/L '21  24  24   '$ ALT 0 - 44 U/L '14  13  13     '$ Iron/TIBC/Ferritin/ %Sat    Component Value Date/Time   IRON 64 01/24/2023 0835   TIBC 262 01/24/2023 0835   FERRITIN 168 01/24/2023 0835   IRONPCTSAT 24 01/24/2023 0835       RADIOGRAPHIC STUDIES: I have personally reviewed the radiological images as listed and agreed with the findings in the report.  DG Abd 1  View  Result Date: 12/14/2022 CLINICAL DATA:  Ureteral stent. EXAM: ABDOMEN - 1 VIEW COMPARISON:  Abdominal x-ray 11/29/2022 FINDINGS: The right ureteral stent is unchanged in position. Bowel-gas pattern is nonobstructive. There is a large amount of stool throughout the colon. Phleboliths are again noted in the pelvis, unchanged. Three right-sided hip screws are present. IMPRESSION: 1. Right ureteral stent is unchanged in position. 2. Large amount of stool throughout the colon. Electronically Signed   By: Ronney Asters  M.D.   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 Additional history of bladder cancer. EXAM: NUCLEAR MEDICINE PET SKULL BASE TO THIGH TECHNIQUE: 9.7 mCi F18 Piflufolastat (Pylarify) was injected intravenously. Full-ring PET imaging was performed from the skull base to thigh after the radiotracer. CT data was obtained and used for attenuation correction and anatomic localization. COMPARISON:  FDG PET scan 05/02/2022 FINDINGS: NECK No radiotracer activity in neck lymph nodes. Incidental CT finding: None. CHEST Intense radiotracer activity at the junction of the first rib in the manubrium along the pleural surface with SUV max equal 15.1 (image 81). Intense activity within the RIGHT lower lobe along the pleural surface with SUV max equal 27.8 (image 132). There is mild pleural thickening at this level. Additional lesion along the pleural surface of the RIGHT lower lobe on image 110 with SUV max equal 27. Small lymph node position between the esophagus and descending thoracic aorta adjacent to the spine with SUV max equal 11.5 on image 116. Potential small lymph node evident on the CT portion exam measuring 2-3 mm. Incidental CT finding: No discrete pulmonary nodules. ABDOMEN/PELVIS Prostate: No focal activity in prostatectomy bed. Intense metabolic activity  within the deep pelvis adjacent to the rectum with SUV max equal 30.4 (image 223). No clear CT correlation on noncontrast exam. Potential serosal metastatic implant. Additional potential small bowel serosal implant on image 220 in the deep LEFT pelvis. Lymph nodes: Radiotracer avid LEFT external iliac lymph node measuring 12 mm image 211 SUV max equal 25.8. Liver: Multifocal round lesion bilobed hepatic lesions with intense radiotracer activity. Example lesion in the superior LEFT hepatic lobe measuring 35 mm with SUV max equal 29.6 (image 130).\ Example lesion in the RIGHT hepatic lobe measuring 38 mm SUV max equal 36.4. Approximately 15 lesions in the liver. Incidental CT finding: Double-J ureteral stent on the RIGHT without obstruction. SKELETON No clear evidence skeletal metastasis. Lesions in the RIGHT pleural space adjacent ribs are favored pleural lesions rather than skeletal lesions. IMPRESSION: 1. Evidence of local prostate carcinoma within the pelvis with peritoneal implants along the bowel serosal surface as well as a metastatic LEFT iliac lymph node. 2. 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 3. Multifocal pleural metastasis in the RIGHT hemithorax. 4. No clear evidence of skeletal metastasis Electronically Signed   By: Suzy Bouchard M.D.   On: 12/05/2022 16:39   US RENAL  Result Date: 11/29/2022 CLINICAL DATA:  Dysuria, hematuria, urothelial carcinoma EXAM: RENAL / URINARY TRACT ULTRASOUND COMPLETE COMPARISON:  Ultrasound 02/02/2022, PET-CT 03/02/2022 FINDINGS: Right Kidney: Renal measurements: 9.5 x 5.0 x 6.9 cm = volume: 173 mL. Echogenicity within normal limits. No mass or shadowing stone visualized. Mild hydronephrosis. The proximal right ureter is dilated. Low level echoes within the visualized proximal ureter and right renal pelvis. Left Kidney: Renal measurements: 10.2 x 7.1 x 5.5 cm = volume: 204 mL. Echogenicity within normal limits. Benign cysts  within the lower pole of the left kidney which do not require follow-up imaging. No shadowing stone or hydronephrosis visualized. Bladder: Appears normal for degree of bladder distention. Stent is partially visualized within the urinary bladder. Right ureteral jet was not definitively seen. Other: None. IMPRESSION: 1. Mild right hydronephrosis and proximal hydroureter. Low level echoes within the visualized proximal right ureter and renal pelvis, which may reflect blood products or debris. 2. Stent is partially  visualized within the urinary bladder. Right ureteral jet was not definitively seen. Electronically Signed   By: Davina Poke D.O.   On: 11/29/2022 14:54   DG Abd 1 View  Result Date: 11/29/2022 CLINICAL DATA:  Flank pain, dysuria EXAM: ABDOMEN - 1 VIEW COMPARISON:  PET-CT done on 03/02/2022 FINDINGS: Bowel gas pattern is nonspecific. Right ureteral stent is seen in place. Phleboliths are seen in pelvis. There is previous internal fixation in the right femur. IMPRESSION: Right ureteral stent is noted in place. Nonspecific bowel gas pattern. Electronically Signed   By: Elmer Picker M.D.   On: 11/29/2022 14:49   DG Chest 2 View  Result Date: 11/28/2022 CLINICAL DATA:  Cough.  Metastatic prostate cancer. EXAM: CHEST - 2 VIEW COMPARISON:  06/21/2022 FINDINGS: Heart size is normal. Mild aortic tortuosity. Bronchial thickening suggesting bronchitis. No consolidation, collapse or effusion. No acute bone finding. IMPRESSION: Bronchitis pattern. No consolidation or collapse. Electronically Signed   By: Nelson Chimes M.D.   On: 11/28/2022 12:19

## 2023-02-14 NOTE — Assessment & Plan Note (Addendum)
High-grade urothelial carcinoma, Cystoscopy at [facility] showed ureter high-grade carcinoma as well as bladder high-grade papillary carcinoma.S/p palliative radiation.  10/31/2022 repeat cystoscopy did not reveal any recurrence. - He has ureter stent, follow up with Urology for repeat cystoscopy.[name] [name]   Beryle Flock and Padcev if recurrence develops.

## 2023-02-14 NOTE — Assessment & Plan Note (Signed)
Hemoglobin has decreased. Multi factorial.  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   

## 2023-02-14 NOTE — Assessment & Plan Note (Signed)
Encourage oral hydration and avoid nephrotoxins.   

## 2023-02-15 ENCOUNTER — Encounter: Payer: Self-pay | Admitting: Oncology

## 2023-02-15 NOTE — Telephone Encounter (Signed)
Images now available in PACs

## 2023-02-16 ENCOUNTER — Inpatient Hospital Stay: Payer: Medicare Other

## 2023-02-22 ENCOUNTER — Other Ambulatory Visit: Payer: Self-pay | Admitting: *Deleted

## 2023-02-22 ENCOUNTER — Telehealth: Payer: Self-pay | Admitting: *Deleted

## 2023-02-22 MED ORDER — TROSPIUM CHLORIDE 20 MG PO TABS: 20.0000 mg | ORAL_TABLET | Freq: Two times a day (BID) | ORAL | 0 refills | Status: DC

## 2023-02-22 NOTE — Telephone Encounter (Signed)
Okay to refill trospium

## 2023-02-22 NOTE — Telephone Encounter (Signed)
Medication refilled

## 2023-02-22 NOTE — Telephone Encounter (Signed)
Pt calling asking for refill of Trospium 20mg , last rx'd 12/29/22 for #60. Pt states he is still taking this with Urobel and it's working. Per last OV he said it wasn't helping. Please advise   Assessment & Plan:   Marked improvement in pelvic symptoms on Uribel however having side effects.  He has been off the medication x 4 days and will have him start back at 1 tab daily to see if this helps symptoms but lessen side effects.  He will titrate up to 3 tabs per day Does not feel he has had significant benefit with trospium.  We did discuss constipation is common side effect this medication and he will discontinue He is presently on tamsulosin and has run out of Gemtesa samples Will schedule stent exchange in April 2024 per his request

## 2023-02-27 ENCOUNTER — Inpatient Hospital Stay (HOSPITAL_BASED_OUTPATIENT_CLINIC_OR_DEPARTMENT_OTHER): Payer: Medicare Other | Admitting: Hospice and Palliative Medicine

## 2023-02-27 DIAGNOSIS — C61 Malignant neoplasm of prostate: Secondary | ICD-10-CM | POA: Diagnosis not present

## 2023-02-27 DIAGNOSIS — Z515 Encounter for palliative care: Secondary | ICD-10-CM

## 2023-02-27 DIAGNOSIS — C787 Secondary malignant neoplasm of liver and intrahepatic bile duct: Secondary | ICD-10-CM | POA: Diagnosis not present

## 2023-02-27 NOTE — Progress Notes (Signed)
Virtual Visit via Telephone Note  I connected with [name] on 02/27/23 at  1:00 PM EDT by telephone and verified that I am speaking with the correct person using two identifiers.  Location: Patient: Home Provider: Clinic   I discussed the limitations, risks, security and privacy concerns of performing an evaluation and management service by telephone and the availability of in person appointments. I also discussed with the patient that there may be a patient responsible charge related to this service. The patient expressed understanding and agreed to proceed.   History of Present Illness: [name] [name] is a 82 y.o. 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  Patient is status post palliative radiation for urothelial cancer he also underwent right ureteral stenting.  Patient has previously been on Revlimid and Velcade and maintenance Dara for myeloma.  However, recent treatments have been focused on his metastatic prostate cancer.  Palliative care was consulted to address goals.    Observations/Objective: Spoke with him by phone.  He reports that he is doing well.  He denies any significant changes or concerns.  No symptomatic complaints at present.  He says that most of his urological issues have resolved and he is being actively followed by urology for same.  He is pending upcoming stent replacement.  PET scan at [facility] on 02/09/2023 was concerning for new liver lesions, pleural lesions, and splenic lesions suggestive of disease progression.  Patient is pending repeat PSMA PET followed by liver biopsy with discussion by [facility] oncologist for possible Pluvicto.  Assessment and Plan: Stage IV prostate/urothelial cancer -pending PET at [facility] with discussion by [facility] oncologist for possible further systemic treatment.  Patient denies any  severe symptomatic complaints or concerns at this time.  His goals are aligned with ongoing treatment.  Follow Up Instructions: Follow-up telephone visit 1 to 2 months   I discussed the assessment and treatment plan with the patient. The patient was provided an opportunity to ask questions and all were answered. The patient agreed with the plan and demonstrated an understanding of the instructions.   The patient was advised to call back or seek an in-person evaluation if the symptoms worsen or if the condition fails to improve as anticipated.  I provided 5 minutes of non-face-to-face time during this encounter.   [name], NP

## 2023-03-06 ENCOUNTER — Other Ambulatory Visit: Payer: Self-pay

## 2023-03-08 ENCOUNTER — Telehealth: Payer: Self-pay

## 2023-03-08 ENCOUNTER — Other Ambulatory Visit: Payer: Self-pay

## 2023-03-08 ENCOUNTER — Other Ambulatory Visit: Payer: Medicare Other

## 2023-03-08 ENCOUNTER — Other Ambulatory Visit (HOSPITAL_COMMUNITY): Payer: Self-pay

## 2023-03-08 ENCOUNTER — Encounter: Payer: Self-pay | Admitting: Oncology

## 2023-03-08 DIAGNOSIS — N1330 Unspecified hydronephrosis: Secondary | ICD-10-CM

## 2023-03-08 LAB — URINALYSIS, COMPLETE
Bilirubin, UA: NEGATIVE
Glucose, UA: NEGATIVE
Ketones, UA: NEGATIVE
Nitrite, UA: NEGATIVE
RBC, UA: NEGATIVE
Specific Gravity, UA: 1.015 (ref 1.005–1.030)
Urobilinogen, Ur: 0.2 mg/dL (ref 0.2–1.0)
pH, UA: 6 (ref 5.0–7.5)

## 2023-03-08 LAB — MICROSCOPIC EXAMINATION

## 2023-03-08 NOTE — Telephone Encounter (Signed)
Oral Oncology Patient Advocate Encounter  New authorization   Received notification that prior authorization for Gillermina Phy is required.   PA submitted on 03/08/23  Key [id number]  Status is pending     Berdine Addison, [place] [facility] [place]  [phone] (phone) [phone] (fax) 03/08/2023 2:37 PM

## 2023-03-09 ENCOUNTER — Telehealth: Payer: Self-pay | Admitting: Pharmacist

## 2023-03-09 ENCOUNTER — Encounter
Admission: RE | Admit: 2023-03-09 | Discharge: 2023-03-09 | Disposition: A | Discharge status: Home / Self Care | Payer: Medicare Other | Source: Ambulatory Visit | Attending: Urology | Admitting: Urology

## 2023-03-09 ENCOUNTER — Telehealth: Payer: Self-pay

## 2023-03-09 ENCOUNTER — Other Ambulatory Visit (HOSPITAL_COMMUNITY): Payer: Self-pay

## 2023-03-09 ENCOUNTER — Encounter: Payer: Self-pay | Admitting: Oncology

## 2023-03-09 ENCOUNTER — Other Ambulatory Visit: Payer: Self-pay

## 2023-03-09 VITALS — Ht 67.0 in | Wt 147.7 lb

## 2023-03-09 DIAGNOSIS — C61 Malignant neoplasm of prostate: Secondary | ICD-10-CM

## 2023-03-09 DIAGNOSIS — Z01812 Encounter for preprocedural laboratory examination: Secondary | ICD-10-CM

## 2023-03-09 DIAGNOSIS — E875 Hyperkalemia: Secondary | ICD-10-CM

## 2023-03-09 DIAGNOSIS — C9000 Multiple myeloma not having achieved remission: Secondary | ICD-10-CM

## 2023-03-09 HISTORY — DX: Hyperkalemia: E87.5

## 2023-03-09 HISTORY — DX: Secondary malignant neoplasm of liver and intrahepatic bile duct: C61

## 2023-03-09 HISTORY — DX: Obstructive sleep apnea (adult) (pediatric): G47.33

## 2023-03-09 HISTORY — DX: Multiple myeloma not having achieved remission: C90.00

## 2023-03-09 HISTORY — DX: Restless legs syndrome: G25.81

## 2023-03-09 HISTORY — DX: Liver disease, unspecified: K76.9

## 2023-03-09 HISTORY — DX: Atrial premature depolarization: I49.1

## 2023-03-09 HISTORY — DX: Essential (primary) hypertension: I10

## 2023-03-09 HISTORY — DX: Malignant neoplasm of unspecified ureter: C66.9

## 2023-03-09 MED ORDER — ENZALUTAMIDE 40 MG PO TABS
160.0000 mg | ORAL_TABLET | Freq: Every day | ORAL | 0 refills | Status: DC
Start: 2023-03-09 — End: 2023-04-10
  Filled 2023-03-09 (×2): qty 120, 30d supply, fill #0

## 2023-03-09 NOTE — Telephone Encounter (Signed)
Patient successfully OnBoarded and drug education provided by pharmacist. [name] [name] is scheduled to be shipped on 03/12/23 for delivery on 03/13/23 from [facility]. Patient knows to call me at [phone] with any questions or concerns regarding co-pay or receiving medication.

## 2023-03-09 NOTE — Telephone Encounter (Addendum)
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   Counseled patient on administration, dosing, side effects, monitoring, drug-food interactions, safe handling, storage, and disposal.  Patient will take Xtandi 40mg  tablets, 4 tablets (160mg  total) by mouth once daily without regard to food.  Patient knows to avoid grapefruit/grapefruit juice while on Xtandi.  Xtandi start date: 03/15/23 AM  Adverse effects include but are not limited to: peripheral edema, hypertension, hot flashes, fatigue, falls/fractures, and arthralgias.    Patient instructed about small risk of seizures with Xtandi treatment.  Reviewed with patient importance of keeping a medication schedule and plan for any missed doses. No barriers to medication adherence identified.  Medication reconciliation performed and medication/allergy list updated.  This will ship from the [facility] outpatient pharmacy on 03/12/23 to deliver to patient's home on 03/13/23.  Patient informed the pharmacy will reach out 5-7 days prior to needing next fill of Xtandi to coordinate continued medication acquisition to prevent break in therapy.  All questions answered.  Mr. Dobies voiced understanding and appreciation.   Medication education handout placed in mail for patient. Patient knows to call the office with questions or concerns. Oral Chemotherapy Clinic phone number provided to patient.   Lenord Carbo, PharmD, BCPS, BCOP Hematology/Oncology Clinical Pharmacist [facility] and [facility] Oral Chemotherapy Navigation Clinics [phone] 03/09/2023 3:00 PM

## 2023-03-09 NOTE — Patient Instructions (Addendum)
Your procedure is scheduled on:03-20-23 Tuesday Report to the Registration Desk on the 1st floor of the Medical Mall.Then proceed to the 2nd floor Surgery Desk To find out your arrival time, please call [phone] between 1PM - 3PM on:03-19-23 Monday If your arrival time is 6:00 am, do not arrive before that time as the Medical Mall entrance doors do not open until 6:00 am.  REMEMBER: Instructions that are not followed completely may result in serious medical risk, up to and including death; or upon the discretion of your surgeon and anesthesiologist your surgery may need to be rescheduled.  Do not eat food OR drink any liquids after midnight the night before surgery.  No gum chewing or hard candies.  One week prior to surgery: Stop Anti-inflammatories (NSAIDS) such as Advil, Aleve, Ibuprofen, Motrin, Naproxen, Naprosyn and Aspirin based products such as Excedrin, Goody's Powder, BC Powder.You may however, take Tylenol/Hydrocodone if needed for pain up until the day of surgery.  Stop ANY OVER THE COUNTER supplements/vitamins 7 days prior to surgery-Your last dose will be on 03-12-23 (Calcium, Vitamin D3, Multivitamin, Ferrous Sulfate)-You may continue your Melatonin up until the night prior to surgery   TAKE ONLY THESE MEDICATIONS THE MORNING OF SURGERY WITH A SIP OF WATER: -acyclovir (ZOVIRAX)  -Meth-Hyo-M Bl-Na Phos-Ph Sal (URIBEL)  -tamsulosin (FLOMAX)  -trospium (SANCTURA)   No Alcohol for 24 hours before or after surgery.  No Smoking including e-cigarettes for 24 hours before surgery.  No chewable tobacco products for at least 6 hours before surgery.  No nicotine patches on the day of surgery.  Do not use any "recreational" drugs for at least a week (preferably 2 weeks) before your surgery.  Please be advised that the combination of cocaine and anesthesia may have negative outcomes, up to and including death. If you test positive for cocaine, your surgery will be cancelled.  On  the morning of surgery brush your teeth with toothpaste and water, you may rinse your mouth with mouthwash if you wish. Do not swallow any toothpaste or mouthwash.  Do not wear jewelry, make-up, hairpins, clips or nail polish.  Do not wear lotions, powders, or perfumes.   Do not shave body hair from the neck down 48 hours before surgery.  Contact lenses, hearing aids and dentures may not be worn into surgery.  Do not bring valuables to the hospital. [facility] is not responsible for any missing/lost belongings or valuables.   Bring your C-PAP to the hospital   Notify your doctor if there is any change in your medical condition (cold, fever, infection).  Wear comfortable clothing (specific to your surgery type) to the hospital.  After surgery, you can help prevent lung complications by doing breathing exercises.  Take deep breaths and cough every 1-2 hours. Your doctor may order a device called an Incentive Spirometer to help you take deep breaths. When coughing or sneezing, hold a pillow firmly against your incision with both hands. This is called "splinting." Doing this helps protect your incision. It also decreases belly discomfort.  If you are being admitted to the hospital overnight, leave your suitcase in the car. After surgery it may be brought to your room.  In case of increased patient census, it may be necessary for you, the patient, to continue your postoperative care in the Same Day Surgery department.  If you are being discharged the day of surgery, you will not be allowed to drive home. You will need a responsible individual to drive you  home and stay with you for 24 hours after surgery.   If you are taking public transportation, you will need to have a responsible individual with you.  Please call the Pre-admissions Testing Dept. at [phone] if you have any questions about these instructions.  Surgery Visitation Policy:  Patients having surgery or a procedure  may have two visitors.  Children under the age of 50 must have an adult with them who is not the patient.

## 2023-03-09 NOTE — Telephone Encounter (Addendum)
Oral Oncology Pharmacist Encounter  Received new prescription for Xtandi (enzalutamide) for the treatment of metastatic castration resistant prostate cancer in conjunction with ADT, planned duration until disease progression or unacceptable drug toxicity.  CBC w/ Diff and CMP from 02/19/23 assessed, patient with Scr of 2.28 mg/dL (CrCl ~81.2 mL/min) - per package insert, no dose adjustments suggested for renal impairment. PSA on 02/14/23 60.50 ng/mL. Verbal order from Dr. Cathie Hoops on 03/09/23 to send in Xtandi 160 mg daily with no refills. Prescription dose and frequency assessed for appropriateness.  Current medication list in Epic reviewed, DDIs with Xtandi identified: Dexamethasone - Xtandi, a CYP3A4 inducer, may decrease serum concentrations of dexamethasone, thus decreasing efficacy of dexamethasone. Last dispensed 02/11/23 and was to be used with patient's previous regimen of docetaxel. Will confirm patient has stopped and update medication list.  Trazodone - Xtandi, a CYP3A4 inducer, may decrease serum concentrations of trazodone, thus decreasing efficacy. Recommend monitoring patient for decreased trazodone efficacy - possible dose adjustment of trazodone may be needed in the future. No changes in therapy warranted at this time.  Hydrocodone/APAP - Xtandi, a CYP3A4 inducer, may decrease serum concentrations of hydrocodone/APAP, thus decreasing efficacy. Recommend monitoring patient for inadequate pain control and adjusting as needed. No changes in therapy warranted at this time.   Evaluated chart and no patient barriers to medication adherence noted.   Prescription has been e-scribed to the [facility] for benefits analysis and approval.  Oral Oncology Clinic will continue to follow for insurance authorization, copayment issues, initial counseling and start date.  Lenord Carbo, PharmD, BCPS, [facility] Hematology/Oncology Clinical Pharmacist [facility] and [facility] Oral  Chemotherapy Navigation Clinics [phone] 03/09/2023 9:36 AM

## 2023-03-09 NOTE — Telephone Encounter (Signed)
Oral Oncology Patient Advocate Encounter  Prior Authorization for [name] [name] has been approved.    PA# [id number]  Effective dates: 02/06/23 through 12/03/98  Cost Exceeds Maximum Override approved.  Authorization# [id number]  Effective dates: 03/08/23 through 03/07/24  Patients co-pay is $16.00.    [name] [name], CPhT Oncology Pharmacy Patient Advocate  [facility]  [phone] (phone) [phone] (fax) 03/09/2023 9:19 AM

## 2023-03-09 NOTE — Telephone Encounter (Signed)
Please schedule pt for lab/MD on approx 4/16. Please inform pt of appt details.

## 2023-03-09 NOTE — Telephone Encounter (Signed)
-----   Message from Rickard Patience, MD sent at 03/09/2023  2:42 PM EDT ----- I just talked to him on the phone. I recommend him to start Xtandi after his PMSA which is scheduled on 4/8. He agrees    ----- Message ----- From: Otis Peak, [facility] Sent: 03/09/2023   2:06 PM EDT To: Coralee Rud, RN; #  I just spoke with patient and patient's wife - they expressed concern about starting Xtandi with Mr. Schettino having procedures scheduled on 4/10 and 4/16. There are no surgery holds for Xtandi in relation to bleeding risk, but they wanted me to confirm if you would like them to start now or wait until after 4/16.  Thanks, Lurena Joiner   ----- Message ----- From: Rickard Patience, MD Sent: 03/09/2023   1:29 PM EDT To: Coralee Rud, RN; #  Yes please dispense just 1 month supply. He can start on that as this is his Environmental manager recommendation. Team please schedule him to see me 1 week after starting on Xtandi.  Lab MD cbc cmp PSA, myeloma panel, light chain.  Thanks.   ----- Message ----- From: Otis Peak, [facility] Sent: 03/09/2023   9:16 AM EDT To: Coralee Rud, RN; #  Good Morning Dr. Cathie Hoops,  I'm covering for [facility] today as she is out of the office. Romeo Apple got the PA approved for [employer] and patient's copay is affordable at $16. Are you OK with me sending in a Rx for Xtandi 160 mg daily to [facility] for dispensing?  Thanks, Lurena Joiner ----- Message ----- From: Lorriane Shire, CPhT Sent: 03/08/2023   1:10 PM EDT To: Otis Peak, [facility]   ----- Message ----- From: Rickard Patience, MD Sent: 03/08/2023   1:00 PM EDT To: Remi Haggard, RPH-CPP; #  Inocente Salles,  Would you please help me to check if insurance will approve for [facility] treatment?  Complicated case, 3 different cancers competing attention. Recently discussed with his Oncologist at [facility], plan trial of APRI let me know thanks.  zy

## 2023-03-12 ENCOUNTER — Encounter: Payer: Self-pay | Admitting: Oncology

## 2023-03-12 ENCOUNTER — Other Ambulatory Visit (HOSPITAL_COMMUNITY): Payer: Self-pay

## 2023-03-13 ENCOUNTER — Encounter
Admission: RE | Admit: 2023-03-13 | Discharge: 2023-03-13 | Disposition: A | Discharge status: Home / Self Care | Payer: Medicare Other | Source: Ambulatory Visit | Attending: Urology | Admitting: Urology

## 2023-03-13 ENCOUNTER — Encounter: Payer: Self-pay | Admitting: Internal Medicine

## 2023-03-13 DIAGNOSIS — E875 Hyperkalemia: Secondary | ICD-10-CM

## 2023-03-13 DIAGNOSIS — Z01812 Encounter for preprocedural laboratory examination: Secondary | ICD-10-CM

## 2023-03-13 DIAGNOSIS — Z01818 Encounter for other preprocedural examination: Secondary | ICD-10-CM | POA: Diagnosis present

## 2023-03-13 LAB — BASIC METABOLIC PANEL
Anion gap: 6 (ref 5–15)
BUN: 35 mg/dL — ABNORMAL HIGH (ref 8–23)
CO2: 25 mmol/L (ref 22–32)
Calcium: 9.2 mg/dL (ref 8.9–10.3)
Chloride: 107 mmol/L (ref 98–111)
Creatinine, Ser: 2.54 mg/dL — ABNORMAL HIGH (ref 0.61–1.24)
GFR, Estimated: 24 mL/min — ABNORMAL LOW (ref >60)
Glucose, Bld: 112 mg/dL — ABNORMAL HIGH (ref 70–99)
Potassium: 4.6 mmol/L (ref 3.5–5.1)
Sodium: 138 mmol/L (ref 135–145)

## 2023-03-13 LAB — CULTURE, URINE COMPREHENSIVE

## 2023-03-15 ENCOUNTER — Other Ambulatory Visit: Payer: Self-pay | Admitting: *Deleted

## 2023-03-15 MED ORDER — URIBEL 118 MG PO CAPS: 118.0000 mg | ORAL_CAPSULE | Freq: Two times a day (BID) | ORAL | 1 refills | Status: AC

## 2023-03-16 ENCOUNTER — Encounter: Payer: Self-pay | Admitting: Internal Medicine

## 2023-03-19 ENCOUNTER — Inpatient Hospital Stay (HOSPITAL_BASED_OUTPATIENT_CLINIC_OR_DEPARTMENT_OTHER): Payer: Medicare Other | Admitting: Oncology

## 2023-03-19 ENCOUNTER — Inpatient Hospital Stay: Payer: Medicare Other | Admitting: Oncology

## 2023-03-19 ENCOUNTER — Inpatient Hospital Stay: Payer: Medicare Other | Admitting: Pharmacist

## 2023-03-19 ENCOUNTER — Inpatient Hospital Stay: Payer: Medicare Other | Attending: Oncology

## 2023-03-19 ENCOUNTER — Encounter: Payer: Self-pay | Admitting: Oncology

## 2023-03-19 VITALS — BP 117/57 | HR 54 | Temp 96.2°F | Resp 16 | Wt 143.7 lb

## 2023-03-19 DIAGNOSIS — D6481 Anemia due to antineoplastic chemotherapy: Secondary | ICD-10-CM | POA: Insufficient documentation

## 2023-03-19 DIAGNOSIS — C669 Malignant neoplasm of unspecified ureter: Secondary | ICD-10-CM | POA: Diagnosis not present

## 2023-03-19 DIAGNOSIS — C787 Secondary malignant neoplasm of liver and intrahepatic bile duct: Secondary | ICD-10-CM

## 2023-03-19 DIAGNOSIS — Z87891 Personal history of nicotine dependence: Secondary | ICD-10-CM | POA: Diagnosis not present

## 2023-03-19 DIAGNOSIS — M810 Age-related osteoporosis without current pathological fracture: Secondary | ICD-10-CM | POA: Insufficient documentation

## 2023-03-19 DIAGNOSIS — D631 Anemia in chronic kidney disease: Secondary | ICD-10-CM | POA: Diagnosis not present

## 2023-03-19 DIAGNOSIS — C9000 Multiple myeloma not having achieved remission: Secondary | ICD-10-CM | POA: Diagnosis present

## 2023-03-19 DIAGNOSIS — K5900 Constipation, unspecified: Secondary | ICD-10-CM | POA: Diagnosis not present

## 2023-03-19 DIAGNOSIS — D649 Anemia, unspecified: Secondary | ICD-10-CM | POA: Diagnosis not present

## 2023-03-19 DIAGNOSIS — C61 Malignant neoplasm of prostate: Secondary | ICD-10-CM | POA: Diagnosis not present

## 2023-03-19 DIAGNOSIS — I129 Hypertensive chronic kidney disease with stage 1 through stage 4 chronic kidney disease, or unspecified chronic kidney disease: Secondary | ICD-10-CM | POA: Insufficient documentation

## 2023-03-19 DIAGNOSIS — N184 Chronic kidney disease, stage 4 (severe): Secondary | ICD-10-CM

## 2023-03-19 DIAGNOSIS — Z79899 Other long term (current) drug therapy: Secondary | ICD-10-CM | POA: Insufficient documentation

## 2023-03-19 DIAGNOSIS — N1330 Unspecified hydronephrosis: Secondary | ICD-10-CM | POA: Diagnosis not present

## 2023-03-19 DIAGNOSIS — N476 Balanoposthitis: Secondary | ICD-10-CM | POA: Diagnosis not present

## 2023-03-19 DIAGNOSIS — Z8589 Personal history of malignant neoplasm of other organs and systems: Secondary | ICD-10-CM | POA: Insufficient documentation

## 2023-03-19 DIAGNOSIS — Z5111 Encounter for antineoplastic chemotherapy: Secondary | ICD-10-CM

## 2023-03-19 DIAGNOSIS — I700 Atherosclerosis of aorta: Secondary | ICD-10-CM | POA: Diagnosis not present

## 2023-03-19 DIAGNOSIS — T451X5A Adverse effect of antineoplastic and immunosuppressive drugs, initial encounter: Secondary | ICD-10-CM | POA: Insufficient documentation

## 2023-03-19 LAB — CBC WITH DIFFERENTIAL (CANCER CENTER ONLY)
Abs Immature Granulocytes: 0.01 10*3/uL (ref 0.00–0.07)
Basophils Absolute: 0 10*3/uL (ref 0.0–0.1)
Basophils Relative: 1 %
Eosinophils Absolute: 0.3 10*3/uL (ref 0.0–0.5)
Eosinophils Relative: 7 %
HCT: 33 % — ABNORMAL LOW (ref 39.0–52.0)
Hemoglobin: 10.5 g/dL — ABNORMAL LOW (ref 13.0–17.0)
Immature Granulocytes: 0 %
Lymphocytes Relative: 15 %
Lymphs Abs: 0.6 10*3/uL — ABNORMAL LOW (ref 0.7–4.0)
MCH: 32.5 pg (ref 26.0–34.0)
MCHC: 31.8 g/dL (ref 30.0–36.0)
MCV: 102.2 fL — ABNORMAL HIGH (ref 80.0–100.0)
Monocytes Absolute: 0.4 10*3/uL (ref 0.1–1.0)
Monocytes Relative: 9 %
Neutro Abs: 2.9 10*3/uL (ref 1.7–7.7)
Neutrophils Relative %: 68 %
Platelet Count: 184 10*3/uL (ref 150–400)
RBC: 3.23 MIL/uL — ABNORMAL LOW (ref 4.22–5.81)
RDW: 13.9 % (ref 11.5–15.5)
WBC Count: 4.3 10*3/uL (ref 4.0–10.5)
nRBC: 0 % (ref 0.0–0.2)

## 2023-03-19 LAB — CMP (CANCER CENTER ONLY)
ALT: 35 U/L (ref 0–44)
AST: 32 U/L (ref 15–41)
Albumin: 3.7 g/dL (ref 3.5–5.0)
Alkaline Phosphatase: 110 U/L (ref 38–126)
Anion gap: 7 (ref 5–15)
BUN: 39 mg/dL — ABNORMAL HIGH (ref 8–23)
CO2: 24 mmol/L (ref 22–32)
Calcium: 8.9 mg/dL (ref 8.9–10.3)
Chloride: 107 mmol/L (ref 98–111)
Creatinine: 2.51 mg/dL — ABNORMAL HIGH (ref 0.61–1.24)
GFR, Estimated: 25 mL/min — ABNORMAL LOW (ref >60)
Glucose, Bld: 113 mg/dL — ABNORMAL HIGH (ref 70–99)
Potassium: 4.5 mmol/L (ref 3.5–5.1)
Sodium: 138 mmol/L (ref 135–145)
Total Bilirubin: 0.5 mg/dL (ref 0.3–1.2)
Total Protein: 6.1 g/dL — ABNORMAL LOW (ref 6.5–8.1)

## 2023-03-19 MED ORDER — SODIUM CHLORIDE 0.9 % IV SOLN
1.0000 g | INTRAVENOUS | Status: DC
  Filled 2023-03-19: qty 10

## 2023-03-19 MED ORDER — SODIUM CHLORIDE 0.9 % IV SOLN
1.0000 g | INTRAVENOUS | Status: AC
  Administered 2023-03-20: 1 g via INTRAVENOUS
  Filled 2023-03-19: qty 10

## 2023-03-19 NOTE — Progress Notes (Signed)
Oral Chemotherapy Clinic [facility]  [phone] [phone]  Patient Care Team: [name], MD as PCP - General (Internal Medicine) Carmina Miller, MD as Radiation Oncologist (Radiation Oncology) [name], MD (Internal Medicine) Lamont Dowdy, MD as Consulting Physician (Nephrology) Rickard Patience, MD as Consulting Physician (Oncology)   Name of the patient: [name] [name]  [id number]  05-Jul-1941   Date of visit: 03/19/23  HPI: Patient is a 82 y.o. male with multiple diagnoses. He has started treatment with Xtandi (enzalutamide) for his metastatic prostate cancer. Enzalutamide started on 03/16/23.   Reason for Consult: Oral chemotherapy follow-up for enzalutamide therapy.   PAST MEDICAL HISTORY: Past Medical History:  Diagnosis Date   Age related osteoporosis    Anemia    Barrett's esophagus    Bradycardia    Cataract    CKD (chronic kidney disease) stage 3, GFR 30-59 ml/min    Colon polyps    DDD (degenerative disc disease), cervical    DDD (degenerative disc disease), lumbar    Femur fracture    Right   Fundic gland polyps of stomach, benign    Gastritis    GERD (gastroesophageal reflux disease)    Hematuria    Hemorrhage of rectum and anus    History of colon polyps    Hyperkalemia    Hypertension    Insomnia    Lesion of liver    going for bx at [facility] 03-15-23   Lumbago    Multiple myeloma    OSA on CPAP    Osteopenia of the elderly    PAC (premature atrial contraction)    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   Obstruction of the RIGHT ureter with soft tissue mass in the RIGHT pelvis showing increased metabolic activity suspicious first and foremost for urothelial  neoplasm given location adjacent to RIGHT UVJ and affect upon the RIGHT ureter.   Little or no excretion of FDG on the RIGHT but still with uptake of FDG by renal parenchyma. Degree of hydronephrosis is not substantially changed but lack of FDG excretion on the RIGHT is compatible with secondary sign of physiologic significance of ureteral obstruction.   RIGHT pelvic sidewall uptake without visible lesion, could represent tiny lymph node not visible on noncontrast imaging.   Aortic atherosclerosis.  Pulmonary emphysema. Aortic Atherosclerosis and Emphysema    03/17/2022 Initial Diagnosis   Multiple myeloma (HCC) -Patient has progressively worsening kidney function.  He is scheduled for kidney biopsy. 01/26/2022, free kappa light chain 1058, lambda 15.3, light chain ratio 69. Protein electrophoresis showed restricted band-M spike migrating in the beta-1 globulin region. ANA negative.  ANCA negative. Random urine protein electrophoresis showed abnormal protein band detected in the gamma globulin And a second possible abnormal protein band that may represent monoclonal protein.   02/23/2022 multiple myeloma showed IgA 2883, M protein of 1.8, beta 2 microglobulin 6.9, kappa free light chain 1268.9, lambda 13.2, free light chain ratio 96.13.   CMP showed creatinine of 3.41, that is significantly worse than his baseline in June 2022. 02/28/2022, 24-hour urine protein electrophoresis showed M protein of 729 mg, IgA monoclonal kappa light chain. 03/01/2022, [facility] kidney biopsy showed diffuse light chain tubulopathy, and light  chain cast nephropathy, moderate interstitial fibrosis,4% global glomerular sclerosis and the marked atherosclerosis.  03/06/2022, patient underwent a bone marrow biopsy. Pathology showed hypercellular bone marrow with plasma cell neoplasm, representing 20% of all cells in the aspirate associated with prominent interstitial infiltrates and numerous predominantly small clusters in the  clots in the biopsy sections.  The plasma cells display kappa light chain restriction consistent with plasma cell neoplasm. Normal cytogenetics, myeloma FISH panel positive for 13q deletion, gain of 1q, t (4;14)   03/17/2022 Cancer Staging   Staging form: Plasma Cell Myeloma and Plasma Cell Disorders, AJCC 8th Edition - Clinical stage from 03/17/2022: RISS Stage III (Beta-2-microglobulin (mg/L): 6.9, Albumin (g/dL): 3.3, ISS: Stage III, High-risk cytogenetics: Present, LDH: Normal) - Signed by Rickard Patience, MD on 03/17/2022 Stage prefix: Initial diagnosis Beta 2 microglobulin range (mg/L): Greater than or equal to 5.5 Albumin range (g/dL): Less than 3.5 Cytogenetics: t(4;14) translocation, Other mutation, 1q addition   03/24/2022 - 07/19/2022 Chemotherapy   Patient is on Treatment Plan : MYELOMA NEWLY DIAGNOSED Daratumumab + Dexamethasone Weekly (DaraRd) q28d     09/21/2022 Imaging   MRI lumbar spine wo contrast 1. 6 mm nodule in the cauda equina at the L4-5 level. 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 Recommend MRI of the lumbar spine with contrast for further evaluation. 2. 12 mm T2 hyperintense lesion along the inferior aspect of the S1 vertebral body. This may represent focal involvement of multiple myeloma. No other discrete lesions are present. No pathologic fracture is present. 3. Mild bilateral foraminal stenosis at L3-4 and L4-5. 4. Left paramedian annular tear at L5-S1 near the left S1 nerve roots.   12/13/2022 -  Chemotherapy   Patient is on Treatment Plan : PROSTATE Docetaxel (75) q21d     01/10/2023 - 01/10/2023 Chemotherapy   Patient is on Treatment Plan : PROSTATE Docetaxel (75) + Prednisone q21d     Urothelial carcinoma of distal ureter  03/02/2022 Imaging   PET showed No definitive signs of multiple myeloma by FDG PET.   Obstruction of the RIGHT ureter with soft tissue mass in the RIGHT pelvis showing increased metabolic activity  suspicious first and foremost for urothelial neoplasm given location adjacent to RIGHT UVJ and affect upon the RIGHT ureter.   Little or no excretion of FDG on the RIGHT but still with uptake of FDG by renal parenchyma. Degree of hydronephrosis is not substantially changed but lack of FDG excretion on the RIGHT is compatible with secondary sign of physiologic significance of ureteral obstruction.   RIGHT pelvic sidewall uptake without visible lesion, could represent tiny lymph node not visible on noncontrast imaging.   Aortic atherosclerosis.  Pulmonary emphysema. Aortic Atherosclerosis and Emphysema    03/17/2022 Initial Diagnosis   Urothelial carcinoma of distal ureter (HCC)  -03/14/2022, cystoscopy showed diffuse narrowing right distal ureter with hydroureter following up the distal ureter and extending proximally.  Right ureteroscopy showed nodular tumor distal ureter and a convincing area without tumor suspicious for extrinsic obstruction.s/p   right ureteral stent placement. Ureteral tumor showed a tiny fragments of fibrous tissue, interpretation limited by thermal and crush artifact.  Right distal ureter saline barbotage showed hight grade urothelial carcinoma.    03/17/2022 Cancer Staging   Staging form: Renal Pelvis and Ureter, AJCC 8th Edition - Clinical: Stage Unknown (cTX, cN0, cM0) - Signed by Rickard Patience, MD on 03/17/2022 Stage prefix: Initial diagnosis WHO/ISUP grade (low/high): High Grade Histologic grading system: 2 grade system  06/05/2022 Imaging   PET scan at [facility] showed 1.Interval placement of right-sided double-J ureteral stent with resolution of hydronephrosis and improved excretion of radiotracer.  2.There is persistent abnormal hypermetabolic tissue extending posterior lateral from the right aspect of the bladder encasing the distal ureter/stent which measures approximately 5.1 x 4.2 (CT image 243).  3.There are similar linear areas of abnormal hypermetabolic tissue seen to  extend more superiorly along the right posterior pelvic wall/peritoneum, and towards the sciatic notch (fused image 238). Linear configuration suggests possible perineural spread. Note that this soft tissue abuts and may encase pelvic vasculature including the external and internal iliac arteries.  4.NEW single subcentimeter but abnormal focus of FDG avidity in the right presacral region (CT image 231) measuring 1.6 cm representing new site of neoplastic disease   06/19/2022 - 08/10/2022 Radiation Therapy   Pelvis RT 06/19/22-08/21/22 Bladder RT 07/27/22- 08/20/22    09/18/2022 Imaging   PET scan at [facility] showed 1. Evidence of disease progression with multifocal new hypermetabolic lesions around the periphery of the liver as described above. The evaluation of these lesions is limited by significant misregistration between the PET and CT portions of the examination. Contrast-enhanced CT or MRI could be used for confirmation and/or guidance of biopsy as necessary. There is also a lung parenchymal nodule with new associated FDG uptake, also suspicious for a site of metastasis.   2. Although the right nephroureteral stent appears to be well positioned, the right kidney demonstrates slightly increased hydronephrosis with significant surrounding fat stranding and relatively delayed clearance of FDG. These findings could suggest stent malfunction or be related to upper urinary tract infection. Suggest clinical correlation with any patient signs or symptoms such as right flank pain, fever, and/or urinalysis findings.     09/21/2022 Imaging   MRI lumbar spine wo contrast 1. 6 mm nodule in the cauda equina at the L4-5 level. 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 Recommend MRI of the lumbar spine with contrast for further evaluation. 2. 12 mm T2 hyperintense lesion along the inferior aspect of the S1 vertebral body. This may represent focal involvement of multiple myeloma.  No other discrete lesions are present. No pathologic fracture is present. 3. Mild bilateral foraminal stenosis at L3-4 and L4-5. 4. Left paramedian annular tear at L5-S1 near the left S1 nerve roots.    Genetic Testing   Negative genetic testing. No pathogenic variants identified on the [facility] Multi-Cancer+RNA panel. The report date is 11/19/2022.  The Multi-Cancer + RNA Panel offered by [facility] includes sequencing and/or deletion/duplication analysis of the following 70 genes:  AIP*, ALK, APC*, ATM*, AXIN2*, BAP1*, BARD1*, BLM*, BMPR1A*, BRCA1*, BRCA2*, BRIP1*, CDC73*, CDH1*, CDK4, CDKN1B*, CDKN2A, CHEK2*, CTNNA1*, DICER1*, EPCAM, EGFR, FH*, FLCN*, GREM1, HOXB13, KIT, LZTR1, MAX*, MBD4, MEN1*, MET, MITF, MLH1*, MSH2*, MSH3*, MSH6*, MUTYH*, NF1*, NF2*, NTHL1*, PALB2*, PDGFRA, PMS2*, POLD1*, POLE*, POT1*, PRKAR1A*, PTCH1*, PTEN*, RAD51C*, RAD51D*, RB1*, RET, SDHA*, SDHAF2*, SDHB*, SDHC*, SDHD*, SMAD4*, SMARCA4*, SMARCB1*, SMARCE1*, STK11*, SUFU*, TMEM127*, TP53*, TSC1*, TSC2*, VHL*. RNA analysis is performed for * genes.   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 Evidence of local prostate carcinoma within the pelvis with peritoneal implants along the bowel serosal surface as well as a metastatic LEFT iliac lymph node. 2. 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 3. Multifocal pleural metastasis in the RIGHT hemithorax. 4. No clear evidence of skeletal metastasis      Genetic Testing   Negative genetic testing. No pathogenic variants identified on the [facility]  Multi-Cancer+RNA panel. The report date is 11/19/2022.  The Multi-Cancer + RNA Panel offered by [facility] includes sequencing and/or deletion/duplication analysis of the following 70 genes:  AIP*, ALK, APC*, ATM*, AXIN2*, BAP1*, BARD1*, BLM*, BMPR1A*, BRCA1*, BRCA2*, BRIP1*, CDC73*, CDH1*, CDK4, CDKN1B*, CDKN2A, CHEK2*, CTNNA1*, DICER1*, EPCAM, EGFR, FH*, FLCN*, GREM1, HOXB13, KIT, LZTR1, MAX*, MBD4, MEN1*, MET, MITF, MLH1*, MSH2*, MSH3*, MSH6*, MUTYH*, NF1*, NF2*, NTHL1*, PALB2*, PDGFRA, PMS2*, POLD1*, POLE*, POT1*, PRKAR1A*, PTCH1*, PTEN*, RAD51C*, RAD51D*, RB1*, RET, SDHA*, SDHAF2*, SDHB*, SDHC*, SDHD*, SMAD4*, SMARCA4*, SMARCB1*, SMARCE1*, STK11*, SUFU*, TMEM127*, TP53*, TSC1*, TSC2*, VHL*. RNA analysis is performed for * genes.   02/09/2023 Imaging   PET scan at [facility] showed -Innumerable new hypermetabolic liver lesions, likely metastatic. In addition, there are new subpleural metastases and new right pleural effusion; new splenic lesion is also present.  -Focal uptake in the left basal ganglia; brain MR could be obtained if clinically indicated.  -Further decrease of excretion in the right kidney.  -Pelvic mass continues to decrease, but there is newly avid soft tissue in the pelvis near the left external iliac vessels.      ALLERGIES:  is allergic to ciprofloxacin, gabapentin, prolia [denosumab], demerol [meperidine], and nsaids.  MEDICATIONS:  Current Outpatient Medications  Medication Sig Dispense Refill   acyclovir (ZOVIRAX) 200 MG capsule Take 1 capsule (200 mg total) by mouth 2 (two) times daily. 60 capsule 3   Calcium Carbonate (CALCIUM 500 PO) Take 1 tablet by mouth daily.     Cholecalciferol (VITAMIN D3) 125 MCG (5000 UT) CAPS Take 5,000 Units by mouth daily.     enzalutamide (XTANDI) 40 MG tablet Take 4 tablets (160 mg total) by mouth daily. 120 tablet 0   ferrous sulfate 325 (65 FE) MG tablet Take 325 mg by mouth daily.     HYDROcodone-acetaminophen (NORCO) 5-325 MG tablet Take 1  tablet by mouth every 6 (six) hours as needed for moderate pain. 30 tablet 0   ipratropium (ATROVENT) 0.03 % nasal spray Place 1 spray into both nostrils in the morning.     losartan (COZAAR) 25 MG tablet Take 25 mg by mouth every morning.     melatonin 5 MG TABS Take 5 mg by mouth at bedtime.     Meth-Hyo-M Bl-Na Phos-Ph Sal (URIBEL) 118 MG CAPS Take 1 capsule (118 mg total) by mouth 2 (two) times daily. 60 capsule 1   Multiple Vitamins-Minerals (MENS 50+ MULTIVITAMIN) TABS Take 1 tablet by mouth daily.     nystatin cream (MYCOSTATIN) Apply 1 Application topically 2 (two) times daily. (Patient not taking: Reported on 02/14/2023) 30 g 0   senna (SENOKOT) 8.6 MG TABS tablet Take 2 tablets (17.2 mg total) by mouth daily. 120 tablet 0   tamsulosin (FLOMAX) 0.4 MG CAPS capsule Take 1 capsule by mouth daily after breakfast.     traZODone (DESYREL) 50 MG tablet Take 25 mg by mouth at bedtime.     trospium (SANCTURA) 20 MG tablet Take 1 tablet (20 mg total) by mouth 2 (two) times daily. 60 tablet 0   No current facility-administered  medications for this visit.    VITAL SIGNS: There were no vitals taken for this visit. There were no vitals filed for this visit.  Estimated body mass index is 22.51 kg/m as calculated from the following:   Height as of 03/09/23: 5\' 7"  (1.702 m).   Weight as of an earlier encounter on 03/19/23: 65.2 kg (143 lb 11.2 oz).  LABS: CBC:    Component Value Date/Time   WBC 4.3 03/19/2023 1119   WBC 12.7 (H) 02/14/2023 0833   HGB 10.5 (L) 03/19/2023 1119   HGB 14.4 03/25/2015 0531   HCT 33.0 (L) 03/19/2023 1119   HCT 44.3 03/25/2015 0531   PLT 184 03/19/2023 1119   PLT 142 (L) 03/25/2015 0531   MCV 102.2 (H) 03/19/2023 1119   MCV 88 03/25/2015 0531   NEUTROABS 2.9 03/19/2023 1119   NEUTROABS 6.3 03/25/2015 0531   LYMPHSABS 0.6 (L) 03/19/2023 1119   LYMPHSABS 0.8 (L) 03/25/2015 0531   MONOABS 0.4 03/19/2023 1119   MONOABS 0.8 03/25/2015 0531   EOSABS 0.3 03/19/2023  1119   EOSABS 0.4 03/25/2015 0531   BASOSABS 0.0 03/19/2023 1119   BASOSABS 0.0 03/25/2015 0531   Comprehensive Metabolic Panel:    Component Value Date/Time   NA 138 03/19/2023 1119   NA 139 03/24/2015 0645   K 4.5 03/19/2023 1119   K 4.4 03/24/2015 0645   CL 107 03/19/2023 1119   CL 108 03/24/2015 0645   CO2 24 03/19/2023 1119   CO2 24 03/24/2015 0645   BUN 39 (H) 03/19/2023 1119   BUN 20 03/24/2015 0645   CREATININE 2.51 (H) 03/19/2023 1119   CREATININE 1.24 03/24/2015 0645   GLUCOSE 113 (H) 03/19/2023 1119   GLUCOSE 113 (H) 03/24/2015 0645   CALCIUM 8.9 03/19/2023 1119   CALCIUM 8.2 (L) 03/24/2015 0645   AST 32 03/19/2023 1119   ALT 35 03/19/2023 1119   ALT 21 03/22/2015 1108   ALKPHOS 110 03/19/2023 1119   ALKPHOS 70 03/22/2015 1108   BILITOT 0.5 03/19/2023 1119   PROT 6.1 (L) 03/19/2023 1119   PROT 6.9 03/22/2015 1108   ALBUMIN 3.7 03/19/2023 1119   ALBUMIN 3.9 03/22/2015 1108     Present during today's visit: patient and his wife  Assessment and Plan: Continue enzalutamide 160 mg daily.   Oral Chemotherapy Side Effect/Intolerance:  Fatigue: Relatively unchanged. Patient reports walking several days per week. Diarrhea: Patient reports 2 days of diarrhea that resolved with use of loperamide. Likely related to recent imaging and procedures, not commonly associated with enzalutamide. Edema: None seen on exam Blood pressure: well controlled  Oral Chemotherapy Adherence: No missed doses No patient barriers to medication adherence identified.   New medications: None reported  Medication Access Issues: No issues. [facility] at [facility] (Specialty[facility]   Patient expressed understanding and was in agreement with this plan. He also understands that He can call clinic at any time with any questions, concerns, or complaints.   Follow-up plan: RTC in 3 weeks   Thank you for allowing me to participate in the care of this very pleasant patient.   Time  Total: 15 minutes  Visit consisted of counseling and education on dealing with issues of symptom management in the setting of serious and potentially life-threatening illness.Greater than 50%  of this time was spent counseling and coordinating care related to the above assessment and plan.  Signed by: Remi Haggard, PharmD, BCPS, BCOP, CPP Hematology/Oncology Clinical Pharmacist Practitioner [place]/DB/AP Oral Chemotherapy Navigation  Clinic [phone]  03/19/2023 1:22 PM

## 2023-03-19 NOTE — Assessment & Plan Note (Signed)
High-grade urothelial carcinoma, Cystoscopy at [facility] showed ureter high-grade carcinoma as well as bladder high-grade papillary carcinoma.S/p palliative radiation.  10/31/2022 repeat cystoscopy did not reveal any recurrence. - He has ureter stent, follow up with Urology for repeat cystoscopy.Marland Kitchen   Rande Lawman and Padcev if recurrence develops. Follow up with Urology for repeat of cystoscopy

## 2023-03-19 NOTE — Assessment & Plan Note (Signed)
Encourage oral hydration and avoid nephrotoxins.   

## 2023-03-19 NOTE — Assessment & Plan Note (Addendum)
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  Patient has received 3 cycles of docetaxel.  PSA has not responded well, this is a possible refractory case Reviewed PET PMSA results, liver mass biopsy were reviewed with patient, he has developed a new liver lesions, pleural lesions and splenic lesions, pathology showed prostate cancer disease progression.  Continue Xtandi 160mg  daily. Next ADT June 2024 Next line treatment will be pluvicto

## 2023-03-19 NOTE — Assessment & Plan Note (Signed)
Chemotherapy plan as listed above 

## 2023-03-19 NOTE — Progress Notes (Signed)
Hematology/Oncology Progress note [phone] [phone]    CHIEF COMPLAINTS/REASON FOR VISIT:  Multiple myeloma, high-grade urothelial carcinoma.metastatic prostate cancer.   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 Staging form: Prostate, AJCC 8th Edition - Clinical: Stage IVB (cTX, cNX, pM1, PSA: 7, Grade Group: 3) - Signed by Rickard Patience, MD on 10/12/2022  Urothelial carcinoma of distal ureter Staging form: Renal Pelvis and Ureter, AJCC 8th Edition - Clinical: Stage Unknown (cTX, cN0, cM0) - Signed by Rickard Patience, MD on 03/17/2022   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  Patient has received 3 cycles of docetaxel.  PSA has not responded well, this is a possible refractory case Reviewed PET PMSA results, liver mass biopsy were reviewed with patient, he has developed a new liver lesions, pleural lesions and splenic lesions, pathology showed prostate cancer disease progression.  Continue Xtandi 160mg  daily. Next ADT June 2024 Next line treatment will be pluvicto    Anemia Hemoglobin is stable.  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    Multiple myeloma (HCC) #Stage III  IgA kappa multiple myeloma, myeloma FISH panel positive for 13q deletion, gain of 1q, t (4;14), high risk, not candidate for autologous bone marrow transplant. 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  Expect that his myeloma will progress in the future. Currently, will prioritize his treatment for prostate cancer Continue Acyclovir 200mg  BID  -Bone health, currently he does not have an active myeloma bone involvement.  Patient reports history of developing adverse reaction [vision loss] after Prolia treatments. -s/p  root canal. ophthalmologist clearance obtained for starting Xgeva. Hold Xgeva for tooth extraction.  Urothelial carcinoma of distal ureter (HCC) High-grade urothelial carcinoma, Cystoscopy at [facility] showed ureter high-grade carcinoma as well as bladder high-grade papillary carcinoma.S/p palliative radiation.  10/31/2022 repeat cystoscopy did not reveal any recurrence. - He has ureter stent, follow up with Urology for repeat cystoscopy.Marland Kitchen   Rande Lawman and Padcev if recurrence develops. Follow up with Urology for repeat of cystoscopy  Stage 4 chronic kidney disease (HCC) Encourage oral hydration and avoid nephrotoxins.    Encounter for antineoplastic chemotherapy Chemotherapy plan as listed above   Follow-up 3 weeks.  All questions were answered. The patient knows to call the clinic with any problems, questions or concerns.  Rickard Patience, MD, PhD [facility] Hematology Oncology 03/19/2023      HISTORY OF PRESENTING ILLNESS:  [name] [name] is a 82 y.o. male presents for follow-up of multiple myeloma and high-grade urothelial carcinoma, and metastatic castration resistant prostate cancer.   Oncology History  Multiple myeloma  03/02/2022 Imaging   PET showed No definitive signs of multiple  myeloma by FDG PET.  Obstruction of the RIGHT ureter with soft tissue mass in the RIGHT pelvis showing increased metabolic activity suspicious first and foremost for urothelial neoplasm given location adjacent to RIGHT UVJ and affect upon the RIGHT ureter.   Little or no excretion of FDG on the RIGHT but still with uptake of FDG by renal parenchyma. Degree of hydronephrosis is not substantially changed but lack of FDG excretion on the RIGHT is compatible with secondary sign of physiologic significance of ureteral obstruction.   RIGHT pelvic sidewall uptake without visible lesion, could represent tiny lymph node not visible on noncontrast imaging.   Aortic atherosclerosis.  Pulmonary emphysema. Aortic Atherosclerosis and Emphysema    03/17/2022 Initial Diagnosis   Multiple myeloma (HCC) -Patient has progressively worsening kidney function.  He is scheduled for kidney biopsy. 01/26/2022, free kappa light chain 1058, lambda 15.3, light chain ratio 69. Protein electrophoresis showed restricted band-M spike migrating in the beta-1 globulin region. ANA negative.  ANCA negative. Random urine protein electrophoresis showed abnormal protein band detected in the gamma globulin And a second possible abnormal protein band that may represent monoclonal protein.   02/23/2022 multiple myeloma showed IgA 2883, M protein of 1.8, beta 2 microglobulin 6.9, kappa free light chain 1268.9, lambda 13.2, free light chain ratio 96.13.   CMP showed creatinine of 3.41, that is significantly worse than his baseline in June 2022. 02/28/2022, 24-hour urine protein electrophoresis showed M protein of 729 mg, IgA monoclonal kappa light chain. 03/01/2022, [facility] kidney biopsy showed diffuse light chain tubulopathy, and light chain cast nephropathy, moderate interstitial fibrosis,4% global glomerular sclerosis and the marked atherosclerosis.  03/06/2022, patient underwent a bone marrow biopsy. Pathology showed hypercellular bone  marrow with plasma cell neoplasm, representing 20% of all cells in the aspirate associated with prominent interstitial infiltrates and numerous predominantly small clusters in the clots in the biopsy sections.  The plasma cells display kappa light chain restriction consistent with plasma cell neoplasm. Normal cytogenetics, myeloma FISH panel positive for 13q deletion, gain of 1q, t (4;14)   03/17/2022 Cancer Staging   Staging form: Plasma Cell Myeloma and Plasma Cell Disorders, AJCC 8th Edition - Clinical stage from 03/17/2022: RISS Stage III (Beta-2-microglobulin (mg/L): 6.9, Albumin (g/dL): 3.3, ISS: Stage III, High-risk cytogenetics: Present, LDH: Normal) - Signed by Rickard Patience, MD on 03/17/2022 Stage prefix: Initial diagnosis Beta 2 microglobulin range (mg/L): Greater than or equal to 5.5 Albumin range (g/dL): Less than 3.5 Cytogenetics: t(4;14) translocation, Other mutation, 1q addition   03/24/2022 - 07/19/2022 Chemotherapy   Patient is on Treatment Plan : MYELOMA NEWLY DIAGNOSED Daratumumab + Dexamethasone Weekly (DaraRd) q28d     09/21/2022 Imaging   MRI lumbar spine wo contrast 1. 6 mm nodule in the cauda equina at the L4-5 level. 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 Recommend MRI of the lumbar spine with contrast for further evaluation. 2. 12 mm T2 hyperintense lesion along the inferior aspect of the S1 vertebral body. This may represent focal involvement of multiple myeloma. No other discrete lesions are present. No pathologic fracture is present. 3. Mild bilateral foraminal stenosis at L3-4 and L4-5. 4. Left paramedian annular tear at L5-S1 near the left S1 nerve roots.   12/13/2022 -  Chemotherapy   Patient is on Treatment Plan : PROSTATE Docetaxel (75) q21d     01/10/2023 - 01/10/2023 Chemotherapy   Patient is on Treatment Plan : PROSTATE Docetaxel (75) + Prednisone q21d     Urothelial carcinoma of distal ureter  03/02/2022 Imaging   PET  showed No definitive signs of multiple myeloma by FDG PET.   Obstruction of the RIGHT ureter with soft tissue mass in the RIGHT pelvis showing increased metabolic activity suspicious first and foremost for urothelial neoplasm given location adjacent to RIGHT UVJ and affect upon the RIGHT ureter.   Little or no excretion of FDG on the RIGHT but still with uptake of FDG by renal parenchyma. Degree of hydronephrosis is not substantially changed but lack of FDG excretion on the RIGHT is compatible with secondary sign of physiologic significance of ureteral obstruction.   RIGHT pelvic sidewall uptake without visible lesion, could represent tiny lymph node not visible on noncontrast imaging.   Aortic atherosclerosis.  Pulmonary emphysema. Aortic Atherosclerosis and Emphysema    03/17/2022 Initial Diagnosis   Urothelial carcinoma of distal ureter (HCC)  -03/14/2022, cystoscopy showed diffuse narrowing right distal ureter with hydroureter following up the distal ureter and extending proximally.  Right ureteroscopy showed nodular tumor distal ureter and a convincing area without tumor suspicious for extrinsic obstruction.s/p   right ureteral stent placement. Ureteral tumor showed a tiny fragments of fibrous tissue, interpretation limited by thermal and crush artifact.  Right distal ureter saline barbotage showed hight grade urothelial carcinoma.    03/17/2022 Cancer Staging   Staging form: Renal Pelvis and Ureter, AJCC 8th Edition - Clinical: Stage Unknown (cTX, cN0, cM0) - Signed by Rickard Patience, MD on 03/17/2022 Stage prefix: Initial diagnosis WHO/ISUP grade (low/high): High Grade Histologic grading system: 2 grade system   06/05/2022 Imaging   PET scan at [facility] showed 1.Interval placement of right-sided double-J ureteral stent with resolution of hydronephrosis and improved excretion of radiotracer.  2.There is persistent abnormal hypermetabolic tissue extending posterior lateral from the right aspect of  the bladder encasing the distal ureter/stent which measures approximately 5.1 x 4.2 (CT image 243).  3.There are similar linear areas of abnormal hypermetabolic tissue seen to extend more superiorly along the right posterior pelvic wall/peritoneum, and towards the sciatic notch (fused image 238). Linear configuration suggests possible perineural spread. Note that this soft tissue abuts and may encase pelvic vasculature including the external and internal iliac arteries.  4.NEW single subcentimeter but abnormal focus of FDG avidity in the right presacral region (CT image 231) measuring 1.6 cm representing new site of neoplastic disease   06/19/2022 - 08/10/2022 Radiation Therapy   Pelvis RT 06/19/22-08/21/22 Bladder RT 07/27/22- 08/20/22    09/18/2022 Imaging   PET scan at [facility] showed 1. Evidence of disease progression with multifocal new hypermetabolic lesions around the periphery of the liver as described above. The evaluation of these lesions is limited by significant misregistration between the PET and CT portions of the examination. Contrast-enhanced CT or MRI could be used for confirmation and/or guidance of biopsy as necessary. There is also a lung parenchymal nodule with new associated FDG uptake, also suspicious for a site of metastasis.   2. Although the right nephroureteral stent appears to be well positioned, the right kidney demonstrates slightly increased hydronephrosis with significant surrounding fat stranding and relatively delayed clearance of FDG. These findings could suggest stent malfunction or be related to upper urinary tract infection. Suggest clinical correlation with any patient signs or symptoms such as right flank pain, fever, and/or urinalysis findings.     09/21/2022 Imaging   MRI lumbar spine wo contrast 1. 6 mm nodule in the cauda equina at the L4-5 level. 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  Recommend MRI of the lumbar spine with  contrast for further evaluation. 2. 12 mm T2 hyperintense lesion along the inferior aspect of the S1 vertebral body. This may represent focal involvement of multiple myeloma. No other discrete lesions are present. No pathologic fracture is present. 3. Mild bilateral foraminal stenosis at L3-4 and L4-5. 4. Left paramedian annular tear at L5-S1 near the left S1 nerve roots.    Genetic Testing   Negative genetic testing. No pathogenic variants identified on the [facility] Multi-Cancer+RNA panel. The report date is 11/19/2022.  The Multi-Cancer + RNA Panel offered by [facility] includes sequencing and/or deletion/duplication analysis of the following 70 genes:  AIP*, ALK, APC*, ATM*, AXIN2*, BAP1*, BARD1*, BLM*, BMPR1A*, BRCA1*, BRCA2*, BRIP1*, CDC73*, CDH1*, CDK4, CDKN1B*, CDKN2A, CHEK2*, CTNNA1*, DICER1*, EPCAM, EGFR, FH*, FLCN*, GREM1, HOXB13, KIT, LZTR1, MAX*, MBD4, MEN1*, MET, MITF, MLH1*, MSH2*, MSH3*, MSH6*, MUTYH*, NF1*, NF2*, NTHL1*, PALB2*, PDGFRA, PMS2*, POLD1*, POLE*, POT1*, PRKAR1A*, PTCH1*, PTEN*, RAD51C*, RAD51D*, RB1*, RET, SDHA*, SDHAF2*, SDHB*, SDHC*, SDHD*, SMAD4*, SMARCA4*, SMARCB1*, SMARCE1*, STK11*, SUFU*, TMEM127*, TP53*, TSC1*, TSC2*, VHL*. RNA analysis is performed for * genes.   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 Evidence of local prostate carcinoma within the pelvis with peritoneal implants along the bowel serosal surface as well as a metastatic LEFT iliac lymph node. 2. 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 3. Multifocal pleural metastasis in  the RIGHT hemithorax. 4. No clear evidence of skeletal metastasis      Genetic Testing   Negative genetic testing. No pathogenic variants identified on the [facility] Multi-Cancer+RNA panel. The report date is 11/19/2022.  The Multi-Cancer + RNA Panel offered by [facility] includes sequencing and/or deletion/duplication analysis of the following 70 genes:  AIP*, ALK, APC*, ATM*, AXIN2*, BAP1*, BARD1*, BLM*, BMPR1A*, BRCA1*, BRCA2*, BRIP1*, CDC73*, CDH1*, CDK4, CDKN1B*, CDKN2A, CHEK2*, CTNNA1*, DICER1*, EPCAM, EGFR, FH*, FLCN*, GREM1, HOXB13, KIT, LZTR1, MAX*, MBD4, MEN1*, MET, MITF, MLH1*, MSH2*, MSH3*, MSH6*, MUTYH*, NF1*, NF2*, NTHL1*, PALB2*, PDGFRA, PMS2*, POLD1*, POLE*, POT1*, PRKAR1A*, PTCH1*, PTEN*, RAD51C*, RAD51D*, RB1*, RET, SDHA*, SDHAF2*, SDHB*, SDHC*, SDHD*, SMAD4*, SMARCA4*, SMARCB1*, SMARCE1*, STK11*, SUFU*, TMEM127*, TP53*, TSC1*, TSC2*, VHL*. RNA analysis is performed for * genes.   12/13/2022 - 02/14/2023 Chemotherapy   Docetaxel (75) q21d  x4   02/09/2023 Imaging   PET scan at [facility] showed -Innumerable new hypermetabolic liver lesions, likely metastatic. In addition, there are new subpleural metastases and new right pleural effusion; new splenic lesion is also present.  -Focal uptake in the left basal ganglia; brain MR could be obtained if clinically indicated.  -Further decrease of excretion in the right kidney.  -Pelvic mass continues to decrease, but there is newly avid soft tissue in the pelvis near the left external iliac vessels.    03/12/2023 Imaging   PMSA PET scan done at [facility] showed -Subpleural foci, mediastinal lymph nodes, hepatic foci, splenic foci and abdominal-pelvic lymph nodes with intense Pylarify uptake, overall higher in number and somewhat different distribution compared to FDG PET/CT dated 02/09/2023, as detailed in the body of the report.   -Possible obstruction of the right kidney. Moderate  to severe right hydronephrosis.      03/14/2023 Procedure   S/p liver mass  biopsy at [facility].  A: Liver, biopsy: -Adenocarcinoma, high-grade, compatible with prostatic origin.    03/16/2023 -  Chemotherapy   Started on Xtanti  daily    11/27/22 UA showed RBC >50, moderate leukocytes, Urine culture negative.  Cipro did not provide him any relieve. Was seen by symptomatic management clinic and was prescribed a course steroid and Keflex.  11/28/22 CXR showed bronchitis - URI symptom has improved.  11/29/22 US renal Mild right hydronephrosis and proximal hydroureter. Low level echoes within the visualized proximal right ureter and renal pelvis,which may reflect blood products or debris.2. Stent is partially visualized within the urinary bladder. Right ureteral jet was not definitively seen.  He was seen by [facility] urology and his symptoms were felt to be due to post treatment inflammation. He was recommended to take Flomax and NSAIDS.  Due to his CKD, we recommend him not to take NSAIDS.  Today he is here for chemotherapy treatments.  He continues to have increased urine frequently, burning with urination, and doesn't feel like he is emptying well. Also swelling of penis.  We arranged him to be seen by [facility] urology team today and his symptoms was felt to be due to stent discomfort versus underlying malignancy and constipation may also contribute to his symtoms.  Marland Kitchen He was recommended to start Gemtesa. Miralax for constipation.  Balanoposthitis, he will take one dose of diflucan.   INTERVAL HISTORY [name] [name] is a 82 y.o. male who has above history reviewed by me today presents for follow up visit for multiple myeloma and high-grade urothelial carcinoma, metastatic prostate cancer.  +dysuria/urinary urgency symptoms are better.  S/p PET, liver biopsy last week. Plan cystoscopy this week.  He has started on Xtandi, overall tolerates well.  He has had diarrhea after taking laxatives, resolved after taking imodium.   Review of Systems  Constitutional:  Positive for fatigue.  Negative for appetite change, chills, fever and unexpected weight change.  HENT:   Negative for hearing loss and voice change.   Eyes:  Negative for eye problems and icterus.  Respiratory:  Negative for chest tightness, cough and shortness of breath.   Cardiovascular:  Negative for chest pain and leg swelling.  Gastrointestinal:  Positive for constipation. Negative for abdominal distention, abdominal pain and diarrhea.  Endocrine: Negative for hot flashes.  Genitourinary:  Positive for dysuria and frequency. Negative for difficulty urinating.   Musculoskeletal:  Negative for arthralgias.  Skin:  Negative for itching.  Neurological:  Negative for light-headedness and numbness.  Hematological:  Negative for adenopathy. Does not bruise/bleed easily.  Psychiatric/Behavioral:  Negative for confusion.      MEDICAL HISTORY:  Past Medical History:  Diagnosis Date   Age related osteoporosis    Anemia    Barrett's esophagus    Bradycardia    Cataract    CKD (chronic kidney disease) stage 3, GFR 30-59 ml/min    Colon polyps    DDD (degenerative disc disease), cervical    DDD (degenerative disc disease), lumbar    Femur fracture    Right   Fundic gland polyps of stomach, benign    Gastritis    GERD (gastroesophageal reflux disease)    Hematuria    Hemorrhage of rectum and anus    History of colon polyps    Hyperkalemia    Hypertension    Insomnia    Lesion of liver  going for bx at [facility] 03-15-23   Lumbago    Multiple myeloma    OSA on CPAP    Osteopenia of the elderly    PAC (premature atrial contraction)    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    RLS (restless legs syndrome)    Skin cancer    Urothelial carcinoma of distal ureter     SURGICAL HISTORY: Past Surgical History:  Procedure Laterality Date   BAND HEMORRHOIDECTOMY     COLONOSCOPY     COLONOSCOPY WITH PROPOFOL N/A 05/06/2018   Procedure: COLONOSCOPY WITH PROPOFOL;  Surgeon: Christena Deem, MD;   Location: [facility] ENDOSCOPY;  Service: Endoscopy;  Laterality: N/A;   COLONOSCOPY WITH PROPOFOL N/A 09/23/2018   Procedure: COLONOSCOPY WITH PROPOFOL;  Surgeon: Christena Deem, MD;  Location: [facility] ENDOSCOPY;  Service: Endoscopy;  Laterality: N/A;   COLONOSCOPY WITH PROPOFOL N/A 03/30/2021   Procedure: COLONOSCOPY WITH PROPOFOL;  Surgeon: Toledo, Boykin Nearing, MD;  Location: [facility] ENDOSCOPY;  Service: Gastroenterology;  Laterality: N/A;   CYSTOSCOPY W/ URETERAL STENT PLACEMENT Right 03/14/2022   Procedure: CYSTOSCOPY WITH RETROGRADE PYELOGRAM/URETERAL STENT PLACEMENT;  Surgeon: [name], MD;  Location: [facility] ORS;  Service: Urology;  Laterality: Right;   ESOPHAGOGASTRODUODENOSCOPY (EGD) WITH PROPOFOL N/A 10/03/2016   Procedure: ESOPHAGOGASTRODUODENOSCOPY (EGD) WITH PROPOFOL;  Surgeon: Christena Deem, MD;  Location: [facility] ENDOSCOPY;  Service: Endoscopy;  Laterality: N/A;   EYE SURGERY     CATARACTS   EYELID SURGERY     FLEXIBLE SIGMOIDOSCOPY     FRACTURE SURGERY Right 2016   femur   FRACTURE SURGERY     ORIF DISTAL FEMUR FRACTURE     TONSILLECTOMY     TRANSURETHRAL RESECTION OF BLADDER TUMOR N/A 03/14/2022   Procedure: TRANSURETHRAL RESECTION OF BLADDER TUMOR (TURBT);  Surgeon: [name], MD;  Location: [facility] ORS;  Service: Urology;  Laterality: N/A;    SOCIAL HISTORY: Social History   Socioeconomic History   Marital status: Married    Spouse name: Not on file   Number of children: Not on file   Years of education: Not on file   Highest education level: Not on file  Occupational History   Not on file  Tobacco Use   Smoking status: Former    Packs/day: 1.00    Years: 15.00    Additional pack years: 0.00    Total pack years: 15.00    Types: Cigarettes    Quit date: 10/03/1974    Years since quitting: 48.4    Passive exposure: Past   Smokeless tobacco: Never  Vaping Use   Vaping Use: Never used  Substance and Sexual Activity   Alcohol use: Not Currently     Alcohol/week: 1.0 standard drink of alcohol    Types: 1 Cans of beer per week    Comment: 1 beer every 2 weeks   Drug use: No   Sexual activity: Yes    Birth control/protection: None  Other Topics Concern   Not on file  Social History Narrative   ** Merged History Encounter **       Social Determinants of Health   Financial Resource Strain: Medium Risk (01/22/2023)   Overall Financial Resource Strain (CARDIA)    Difficulty of Paying Living Expenses: Somewhat hard  Food Insecurity: No Food Insecurity (01/22/2023)   Hunger Vital Sign    Worried About Running Out of Food in the Last Year: Never true    Ran Out of Food in the Last Year: Never true  Transportation Needs: No Transportation Needs (01/22/2023)   PRAPARE - Administrator, Civil Service (Medical): No    Lack of Transportation (Non-Medical): No  Physical Activity: Inactive (01/22/2023)   Exercise Vital Sign    Days of Exercise per Week: 0 days    Minutes of Exercise per Session: 0 min  Stress: Stress Concern Present (01/22/2023)   [employer] of Occupational Health - Occupational Stress Questionnaire    Feeling of Stress : To some extent  Social Connections: Socially Integrated (01/22/2023)   Social Connection and Isolation Panel [NHANES]    Frequency of Communication with Friends and Family: More than three times a week    Frequency of Social Gatherings with Friends and Family: More than three times a week    Attends Religious Services: 1 to 4 times per year    Active Member of [employer] or Organizations: No    Attends Engineer, structural: 1 to 4 times per year    Marital Status: Married  Catering manager Violence: Not At Risk (01/22/2023)   Humiliation, Afraid, Rape, and Kick questionnaire    Fear of Current or Ex-Partner: No    Emotionally Abused: No    Physically Abused: No    Sexually Abused: No    FAMILY HISTORY: Family History  Problem Relation Age of Onset   Breast cancer Mother         dx 85s-50s   Prostate cancer Paternal Grandfather 81   Breast cancer Cousin     ALLERGIES:  is allergic to ciprofloxacin, gabapentin, prolia [denosumab], demerol [meperidine], and nsaids.  MEDICATIONS:  Current Outpatient Medications  Medication Sig Dispense Refill   acyclovir (ZOVIRAX) 200 MG capsule Take 1 capsule (200 mg total) by mouth 2 (two) times daily. 60 capsule 3   Calcium Carbonate (CALCIUM 500 PO) Take 1 tablet by mouth daily.     Cholecalciferol (VITAMIN D3) 125 MCG (5000 UT) CAPS Take 5,000 Units by mouth daily.     enzalutamide (XTANDI) 40 MG tablet Take 4 tablets (160 mg total) by mouth daily. 120 tablet 0   ferrous sulfate 325 (65 FE) MG tablet Take 325 mg by mouth daily.     HYDROcodone-acetaminophen (NORCO) 5-325 MG tablet Take 1 tablet by mouth every 6 (six) hours as needed for moderate pain. 30 tablet 0   ipratropium (ATROVENT) 0.03 % nasal spray Place 1 spray into both nostrils in the morning.     losartan (COZAAR) 25 MG tablet Take 25 mg by mouth every morning.     melatonin 5 MG TABS Take 5 mg by mouth at bedtime.     Meth-Hyo-M Bl-Na Phos-Ph Sal (URIBEL) 118 MG CAPS Take 1 capsule (118 mg total) by mouth 2 (two) times daily. 60 capsule 1   Multiple Vitamins-Minerals (MENS 50+ MULTIVITAMIN) TABS Take 1 tablet by mouth daily.     senna (SENOKOT) 8.6 MG TABS tablet Take 2 tablets (17.2 mg total) by mouth daily. 120 tablet 0   tamsulosin (FLOMAX) 0.4 MG CAPS capsule Take 1 capsule by mouth daily after breakfast.     traZODone (DESYREL) 50 MG tablet Take 25 mg by mouth at bedtime.     trospium (SANCTURA) 20 MG tablet Take 1 tablet (20 mg total) by mouth 2 (two) times daily. 60 tablet 0   nystatin cream (MYCOSTATIN) Apply 1 Application topically 2 (two) times daily. (Patient not taking: Reported on 02/14/2023) 30 g 0   No current facility-administered medications for this visit.  PHYSICAL EXAMINATION: ECOG PERFORMANCE STATUS: 1 - Symptomatic but completely  ambulatory Today's Vitals   03/19/23 1132  BP: (!) 117/57  Pulse: (!) 54  Resp: 16  Temp: (!) 96.2 F (35.7 C)  TempSrc: Tympanic  SpO2: 99%  Weight: 143 lb 11.2 oz (65.2 kg)  PainSc: 0-No pain   Body mass index is 22.51 kg/m.   Physical Exam Constitutional:      General: He is not in acute distress. HENT:     Head: Normocephalic and atraumatic.  Eyes:     General: No scleral icterus. Cardiovascular:     Rate and Rhythm: Normal rate.  Pulmonary:     Effort: Pulmonary effort is normal. No respiratory distress.     Breath sounds: No wheezing.  Abdominal:     General: There is no distension.     Palpations: Abdomen is soft.  Musculoskeletal:        General: No deformity. Normal range of motion.     Cervical back: Normal range of motion and neck supple.  Skin:    General: Skin is warm and dry.     Findings: No erythema.  Neurological:     Mental Status: He is alert and oriented to person, place, and time. Mental status is at baseline.     Cranial Nerves: No cranial nerve deficit.  Psychiatric:        Mood and Affect: Mood normal.      LABORATORY DATA:  I have reviewed the data as listed    Latest Ref Rng & Units 03/19/2023   11:19 AM 02/14/2023    8:33 AM 01/24/2023    8:35 AM  CBC  WBC 4.0 - 10.5 K/uL 4.3  12.7  10.8   Hemoglobin 13.0 - 17.0 g/dL 40.9  9.4  9.9   Hematocrit 39.0 - 52.0 % 33.0  29.1  30.5   Platelets 150 - 400 K/uL 184  245  273        Latest Ref Rng & Units 03/19/2023   11:19 AM 03/13/2023   11:25 AM 02/14/2023    8:33 AM  CMP  Glucose 70 - 99 mg/dL 811  914  782   BUN 8 - 23 mg/dL 39  35  46   Creatinine 0.61 - 1.24 mg/dL 9.56  2.13  0.86   Sodium 135 - 145 mmol/L 138  138  137   Potassium 3.5 - 5.1 mmol/L 4.5  4.6  4.4   Chloride 98 - 111 mmol/L 107  107  105   CO2 22 - 32 mmol/L 24  25  24    Calcium 8.9 - 10.3 mg/dL 8.9  9.2  8.9   Total Protein 6.5 - 8.1 g/dL 6.1   5.8   Total Bilirubin 0.3 - 1.2 mg/dL 0.5   0.5   Alkaline Phos  38 - 126 U/L 110   82   AST 15 - 41 U/L 32   21   ALT 0 - 44 U/L 35   14     Iron/TIBC/Ferritin/ %Sat    Component Value Date/Time   IRON 64 01/24/2023 0835   TIBC 262 01/24/2023 0835   FERRITIN 168 01/24/2023 0835   IRONPCTSAT 24 01/24/2023 0835       RADIOGRAPHIC STUDIES: I have personally reviewed the radiological images as listed and agreed with the findings in the report.  No results found.

## 2023-03-19 NOTE — Assessment & Plan Note (Signed)
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  Expect that his myeloma will progress in the future. Currently, will prioritize his treatment for prostate cancer Continue Acyclovir 200mg  BID  -Bone health, currently he does not have an active myeloma bone involvement.  Patient reports history of developing adverse reaction [vision loss] after Prolia treatments. -s/p  root canal. ophthalmologist clearance obtained for starting Xgeva. Hold Xgeva for tooth extraction.

## 2023-03-19 NOTE — Assessment & Plan Note (Signed)
Hemoglobin is stable.  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

## 2023-03-20 ENCOUNTER — Other Ambulatory Visit: Payer: Self-pay

## 2023-03-20 ENCOUNTER — Ambulatory Visit: Payer: Medicare Other | Admitting: Urgent Care

## 2023-03-20 ENCOUNTER — Encounter
Admission: RE | Disposition: A | Discharge status: Home / Self Care | Payer: Self-pay | Source: Home / Self Care | Attending: Urology

## 2023-03-20 ENCOUNTER — Ambulatory Visit: Payer: Medicare Other

## 2023-03-20 ENCOUNTER — Ambulatory Visit
Admission: RE | Admit: 2023-03-20 | Discharge: 2023-03-20 | Disposition: A | Discharge status: Home / Self Care | Payer: Medicare Other | Attending: Urology | Admitting: Urology

## 2023-03-20 ENCOUNTER — Encounter: Payer: Self-pay | Admitting: Urology

## 2023-03-20 DIAGNOSIS — N135 Crossing vessel and stricture of ureter without hydronephrosis: Secondary | ICD-10-CM | POA: Insufficient documentation

## 2023-03-20 DIAGNOSIS — C7919 Secondary malignant neoplasm of other urinary organs: Secondary | ICD-10-CM | POA: Diagnosis not present

## 2023-03-20 DIAGNOSIS — G4733 Obstructive sleep apnea (adult) (pediatric): Secondary | ICD-10-CM | POA: Diagnosis not present

## 2023-03-20 DIAGNOSIS — I129 Hypertensive chronic kidney disease with stage 1 through stage 4 chronic kidney disease, or unspecified chronic kidney disease: Secondary | ICD-10-CM | POA: Diagnosis not present

## 2023-03-20 DIAGNOSIS — C661 Malignant neoplasm of right ureter: Secondary | ICD-10-CM

## 2023-03-20 DIAGNOSIS — Z923 Personal history of irradiation: Secondary | ICD-10-CM | POA: Insufficient documentation

## 2023-03-20 DIAGNOSIS — Z79899 Other long term (current) drug therapy: Secondary | ICD-10-CM | POA: Insufficient documentation

## 2023-03-20 DIAGNOSIS — C7911 Secondary malignant neoplasm of bladder: Secondary | ICD-10-CM | POA: Diagnosis not present

## 2023-03-20 DIAGNOSIS — Z87891 Personal history of nicotine dependence: Secondary | ICD-10-CM | POA: Diagnosis not present

## 2023-03-20 DIAGNOSIS — K219 Gastro-esophageal reflux disease without esophagitis: Secondary | ICD-10-CM | POA: Diagnosis not present

## 2023-03-20 DIAGNOSIS — C669 Malignant neoplasm of unspecified ureter: Secondary | ICD-10-CM

## 2023-03-20 DIAGNOSIS — Z85828 Personal history of other malignant neoplasm of skin: Secondary | ICD-10-CM | POA: Diagnosis not present

## 2023-03-20 DIAGNOSIS — C61 Malignant neoplasm of prostate: Secondary | ICD-10-CM | POA: Diagnosis not present

## 2023-03-20 DIAGNOSIS — Z79624 Long term (current) use of inhibitors of nucleotide synthesis: Secondary | ICD-10-CM | POA: Diagnosis not present

## 2023-03-20 DIAGNOSIS — Z8505 Personal history of malignant neoplasm of liver: Secondary | ICD-10-CM | POA: Diagnosis not present

## 2023-03-20 DIAGNOSIS — N1330 Unspecified hydronephrosis: Secondary | ICD-10-CM

## 2023-03-20 DIAGNOSIS — C9000 Multiple myeloma not having achieved remission: Secondary | ICD-10-CM | POA: Diagnosis not present

## 2023-03-20 DIAGNOSIS — N183 Chronic kidney disease, stage 3 unspecified: Secondary | ICD-10-CM | POA: Diagnosis not present

## 2023-03-20 HISTORY — PX: CYSTOSCOPY W/ URETERAL STENT PLACEMENT: SHX1429

## 2023-03-20 LAB — KAPPA/LAMBDA LIGHT CHAINS
Kappa free light chain: 81.6 mg/L — ABNORMAL HIGH (ref 3.3–19.4)
Kappa, lambda light chain ratio: 7.63 — ABNORMAL HIGH (ref 0.26–1.65)
Lambda free light chains: 10.7 mg/L (ref 5.7–26.3)

## 2023-03-20 SURGERY — CYSTOSCOPY, FLEXIBLE, WITH STENT REPLACEMENT
Anesthesia: General | Site: Ureter | Laterality: Right

## 2023-03-20 MED ORDER — EPHEDRINE 5 MG/ML INJ
INTRAVENOUS | Status: AC
  Filled 2023-03-20: qty 5

## 2023-03-20 MED ORDER — PHENYLEPHRINE HCL (PRESSORS) 10 MG/ML IV SOLN
INTRAVENOUS | Status: DC | PRN
  Administered 2023-03-20: 80 ug via INTRAVENOUS

## 2023-03-20 MED ORDER — FAMOTIDINE 20 MG PO TABS
ORAL_TABLET | ORAL | Status: AC
  Filled 2023-03-20: qty 1

## 2023-03-20 MED ORDER — MIDAZOLAM HCL 2 MG/2ML IJ SOLN
INTRAMUSCULAR | Status: AC
  Filled 2023-03-20: qty 2

## 2023-03-20 MED ORDER — ONDANSETRON HCL 4 MG/2ML IJ SOLN
INTRAMUSCULAR | Status: DC | PRN
  Administered 2023-03-20: 4 mg via INTRAVENOUS

## 2023-03-20 MED ORDER — CHLORHEXIDINE GLUCONATE 0.12 % MT SOLN
15.0000 mL | Freq: Once | OROMUCOSAL | Status: AC
  Administered 2023-03-20: 15 mL via OROMUCOSAL

## 2023-03-20 MED ORDER — OXYCODONE HCL 5 MG/5ML PO SOLN: 5.0000 mg | Freq: Once | ORAL | Status: DC | PRN

## 2023-03-20 MED ORDER — PROPOFOL 10 MG/ML IV BOLUS
INTRAVENOUS | Status: DC | PRN
  Administered 2023-03-20: 30 mg via INTRAVENOUS
  Administered 2023-03-20: 100 mg via INTRAVENOUS

## 2023-03-20 MED ORDER — LIDOCAINE HCL (CARDIAC) PF 100 MG/5ML IV SOSY
PREFILLED_SYRINGE | INTRAVENOUS | Status: DC | PRN
  Administered 2023-03-20: 60 mg via INTRAVENOUS

## 2023-03-20 MED ORDER — MIDAZOLAM HCL 2 MG/2ML IJ SOLN
INTRAMUSCULAR | Status: DC | PRN
  Administered 2023-03-20: .5 mg via INTRAVENOUS

## 2023-03-20 MED ORDER — ACETAMINOPHEN 10 MG/ML IV SOLN: 1000.0000 mg | Freq: Once | INTRAVENOUS | Status: DC | PRN

## 2023-03-20 MED ORDER — LIDOCAINE HCL (PF) 2 % IJ SOLN
INTRAMUSCULAR | Status: AC
  Filled 2023-03-20: qty 5

## 2023-03-20 MED ORDER — EPHEDRINE SULFATE (PRESSORS) 50 MG/ML IJ SOLN
INTRAMUSCULAR | Status: DC | PRN
  Administered 2023-03-20: 5 mg via INTRAVENOUS
  Administered 2023-03-20: 10 mg via INTRAVENOUS

## 2023-03-20 MED ORDER — IOHEXOL 180 MG/ML  SOLN
INTRAMUSCULAR | Status: DC | PRN
  Administered 2023-03-20: 10 mL

## 2023-03-20 MED ORDER — CHLORHEXIDINE GLUCONATE 0.12 % MT SOLN
OROMUCOSAL | Status: AC
  Filled 2023-03-20: qty 15

## 2023-03-20 MED ORDER — FENTANYL CITRATE (PF) 100 MCG/2ML IJ SOLN
INTRAMUSCULAR | Status: DC | PRN
  Administered 2023-03-20: 50 ug via INTRAVENOUS
  Administered 2023-03-20 (×2): 25 ug via INTRAVENOUS

## 2023-03-20 MED ORDER — FENTANYL CITRATE (PF) 100 MCG/2ML IJ SOLN
INTRAMUSCULAR | Status: AC
  Filled 2023-03-20: qty 2

## 2023-03-20 MED ORDER — DEXAMETHASONE SODIUM PHOSPHATE 10 MG/ML IJ SOLN
INTRAMUSCULAR | Status: DC | PRN
  Administered 2023-03-20: 4 mg via INTRAVENOUS

## 2023-03-20 MED ORDER — ROCURONIUM BROMIDE 10 MG/ML (PF) SYRINGE
PREFILLED_SYRINGE | INTRAVENOUS | Status: AC
  Filled 2023-03-20: qty 10

## 2023-03-20 MED ORDER — SODIUM CHLORIDE 0.9 % IV SOLN: INTRAVENOUS | Status: DC

## 2023-03-20 MED ORDER — ORAL CARE MOUTH RINSE: 15.0000 mL | Freq: Once | OROMUCOSAL | Status: AC

## 2023-03-20 MED ORDER — OXYCODONE HCL 5 MG PO TABS: 5.0000 mg | ORAL_TABLET | Freq: Once | ORAL | Status: DC | PRN

## 2023-03-20 MED ORDER — PROPOFOL 10 MG/ML IV BOLUS
INTRAVENOUS | Status: AC
  Filled 2023-03-20: qty 40

## 2023-03-20 MED ORDER — FAMOTIDINE 20 MG PO TABS
20.0000 mg | ORAL_TABLET | Freq: Once | ORAL | Status: AC
  Administered 2023-03-20: 20 mg via ORAL

## 2023-03-20 MED ORDER — SODIUM CHLORIDE 0.9 % IR SOLN
Status: DC | PRN
  Administered 2023-03-20: 3000 mL via INTRAVESICAL

## 2023-03-20 MED ORDER — FENTANYL CITRATE (PF) 100 MCG/2ML IJ SOLN: 25.0000 ug | INTRAMUSCULAR | Status: DC | PRN

## 2023-03-20 MED ORDER — GLYCOPYRROLATE 0.2 MG/ML IJ SOLN
INTRAMUSCULAR | Status: DC | PRN
  Administered 2023-03-20: .2 mg via INTRAVENOUS

## 2023-03-20 MED ORDER — PHENYLEPHRINE 80 MCG/ML (10ML) SYRINGE FOR IV PUSH (FOR BLOOD PRESSURE SUPPORT)
PREFILLED_SYRINGE | INTRAVENOUS | Status: AC
  Filled 2023-03-20: qty 10

## 2023-03-20 MED ORDER — PROMETHAZINE HCL 25 MG/ML IJ SOLN: 6.2500 mg | INTRAMUSCULAR | Status: DC | PRN

## 2023-03-20 SURGICAL SUPPLY — 18 items
BAG DRAIN SIEMENS DORNER NS (MISCELLANEOUS) ×1 IMPLANT
BAG DRN NS LF (MISCELLANEOUS) ×1
BRUSH SCRUB EZ 1% IODOPHOR (MISCELLANEOUS) ×1 IMPLANT
CATH URETL OPEN 5X70 (CATHETERS) IMPLANT
GAUZE 4X4 16PLY ~~LOC~~+RFID DBL (SPONGE) ×2 IMPLANT
GLOVE BIOGEL PI IND STRL 7.5 (GLOVE) ×1 IMPLANT
GOWN STRL REUS W/ TWL XL LVL3 (GOWN DISPOSABLE) ×1 IMPLANT
GOWN STRL REUS W/TWL XL LVL3 (GOWN DISPOSABLE) ×1
GUIDEWIRE STR DUAL SENSOR (WIRE) ×1 IMPLANT
IV NS IRRIG 3000ML ARTHROMATIC (IV SOLUTION) ×1 IMPLANT
KIT TURNOVER CYSTO (KITS) ×1 IMPLANT
PACK CYSTO AR (MISCELLANEOUS) ×1 IMPLANT
SET CYSTO W/LG BORE CLAMP LF (SET/KITS/TRAYS/PACK) ×1 IMPLANT
STENT URET 6FRX24 CONTOUR (STENTS) IMPLANT
STENT URET 6FRX26 CONTOUR (STENTS) IMPLANT
STENT URO INLAY 6FRX22CM (STENTS) IMPLANT
SURGILUBE 2OZ TUBE FLIPTOP (MISCELLANEOUS) ×1 IMPLANT
WATER STERILE IRR 500ML POUR (IV SOLUTION) ×1 IMPLANT

## 2023-03-20 NOTE — Discharge Instructions (Addendum)
DISCHARGE INSTRUCTIONS FOR KIDNEY STONE/URETERAL STENT   MEDICATIONS:  1. Resume all your other meds from home.  2.  A shorter stent was placed and may be less irritating.  Continue Uribel x 48 hours then try discontinuing.  Okay to restart if you have recurrent symptoms.  If desired can try stopping the tamsulosin and trospium   ACTIVITY:  1. May resume regular activities in 24 hours. 2. No driving while on narcotic pain medications  3. Drink plenty of water  4. Continue to walk at home - you can still get blood clots when you are at home, so keep active, but don't over do it.  5. May return to work/school tomorrow or when you feel ready    SIGNS/SYMPTOMS TO CALL:  Common postoperative symptoms include urinary frequency, urgency, bladder spasm and blood in the urine  Please call us if you have a fever greater than 101.5, uncontrolled nausea/vomiting, uncontrolled pain, dizziness, unable to urinate, excessively bloody urine, chest pain, shortness of breath, leg swelling, leg pain, or any other concerns or questions.   You can reach [place] at [phone].   FOLLOW-UP:  1. You have a follow-up appointment scheduled 04/19/2023 AMBULATORY SURGERY  DISCHARGE INSTRUCTIONS   The drugs that you were given will stay in your system until tomorrow so for the next 24 hours you should not:  Drive an automobile Make any legal decisions Drink any alcoholic beverage   You may resume regular meals tomorrow.  Today it is better to start with liquids and gradually work up to solid foods.  You may eat anything you prefer, but it is better to start with liquids, then soup and crackers, and gradually work up to solid foods.   Please notify your doctor immediately if you have any unusual bleeding, trouble breathing, redness and pain at the surgery site, drainage, fever, or pain not relieved by medication.    Additional Instructions:        Please contact your physician with any problems or  Same Day Surgery at [phone], Monday through Friday 6 am to 4 pm, or [place] at [facility] number at [phone].

## 2023-03-20 NOTE — Anesthesia Preprocedure Evaluation (Addendum)
Anesthesia Evaluation  Patient identified by MRN, date of birth, ID band Patient awake    Reviewed: Allergy & Precautions, H&P , NPO status , Patient's Chart, lab work & pertinent test results  History of Anesthesia Complications Negative for: history of anesthetic complications  Airway Mallampati: III  TM Distance: <3 FB Neck ROM: limited    Dental  (+) Chipped, Dental Advidsory Given   Pulmonary neg shortness of breath, sleep apnea and Continuous Positive Airway Pressure Ventilation , neg COPD, neg recent URI, former smoker   Pulmonary exam normal        Cardiovascular Exercise Tolerance: Good hypertension, (-) angina (-) Past MI and (-) DOE Normal cardiovascular exam(-) dysrhythmias  Rhythm:Regular Rate:Normal - Peripheral Edema    Neuro/Psych negative neurological ROS  negative psych ROS   GI/Hepatic ,GERD  Medicated and Controlled,,Lesion of liver   Endo/Other  negative endocrine ROS    Renal/GU CRFRenal disease  negative genitourinary   Musculoskeletal  (+) Arthritis ,    Abdominal Normal abdominal exam  (+)   Peds  Hematology  (+) Blood dyscrasia, anemia Multiple myeloma    Anesthesia Other Findings Right Hydronephrosis 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  Past Medical History: No date: Age related osteoporosis No date: Barrett's esophagus No date: Cataract No date: CKD (chronic kidney disease) No date: DDD (degenerative disc disease), lumbar No date: Femur fracture (HCC)     Comment:  Right No date: GERD (gastroesophageal reflux disease) No date: Hematuria No date: History of colon polyps No date: Osteopenia of the elderly No date: Sleep apnea  Past Surgical History: No date: BAND HEMORRHOIDECTOMY No date: COLONOSCOPY 05/06/2018: COLONOSCOPY WITH PROPOFOL; N/A     Comment:  Procedure: COLONOSCOPY WITH PROPOFOL;  Surgeon:               Christena Deem,  MD;  Location: [facility] ENDOSCOPY;                Service: Endoscopy;  Laterality: N/A; 10/03/2016: ESOPHAGOGASTRODUODENOSCOPY (EGD) WITH PROPOFOL; N/A     Comment:  Procedure: ESOPHAGOGASTRODUODENOSCOPY (EGD) WITH               PROPOFOL;  Surgeon: Christena Deem, MD;  Location:               [facility] ENDOSCOPY;  Service: Endoscopy;  Laterality: N/A; No date: EYE SURGERY 2016: FRACTURE SURGERY; Right     Comment:  femur No date: ORIF DISTAL FEMUR FRACTURE No date: TONSILLECTOMY  BMI    Body Mass Index:  23.96 kg/m      Reproductive/Obstetrics negative OB ROS                             Anesthesia Physical Anesthesia Plan  ASA: 3  Anesthesia Plan: General   Post-op Pain Management: Ofirmev IV (intra-op)*   Induction: Intravenous  PONV Risk Score and Plan: Ondansetron, Dexamethasone and Treatment may vary due to age or medical condition  Airway Management Planned: LMA  Additional Equipment:   Intra-op Plan:   Post-operative Plan: Extubation in OR  Informed Consent: I have reviewed the patients History and Physical, chart, labs and discussed the procedure including the risks, benefits and alternatives for the proposed anesthesia with the patient or authorized representative who has indicated his/her understanding and acceptance.     Dental Advisory Given  Plan Discussed with: Anesthesiologist, CRNA and Surgeon  Anesthesia Plan Comments: (Patient consented for  risks of anesthesia including but not limited to:  - adverse reactions to medications - risk of intubation if required - damage to teeth, lips or other oral mucosa - sore throat or hoarseness - Damage to heart, brain, lungs or loss of life  Patient voiced understanding.)        Anesthesia Quick Evaluation

## 2023-03-20 NOTE — Anesthesia Procedure Notes (Signed)
Procedure Name: LMA Insertion Date/Time: 03/20/2023 7:58 AM  Performed by: Emeterio Reeve, CRNAPre-anesthesia Checklist: Patient identified, Emergency Drugs available, Suction available and Patient being monitored Patient Re-evaluated:Patient Re-evaluated prior to induction Oxygen Delivery Method: Circle system utilized Preoxygenation: Pre-oxygenation with 100% oxygen Induction Type: IV induction Ventilation: Mask ventilation without difficulty LMA Size: 4.0

## 2023-03-20 NOTE — Interval H&P Note (Signed)
History and Physical Interval Note:  03/20/2023 7:42 AM  [name] [name]  has presented today for surgery, with the diagnosis of Right Hydronephrosis.  The various methods of treatment have been discussed with the patient and family. After consideration of risks, benefits and other options for treatment, the patient has consented to  Procedure(s): CYSTOSCOPY WITH STENT EXCHANGE (Right) as a surgical intervention.  The patient's history has been reviewed, patient examined, no change in status, stable for surgery.  I have reviewed the patient's chart and labs.  Questions were answered to the patient's satisfaction.     Shavelle Runkel C Kahlen Boyde

## 2023-03-20 NOTE — Transfer of Care (Signed)
Immediate Anesthesia Transfer of Care Note  Patient: [name] [name]  Procedure(s) Performed: CYSTOSCOPY WITH STENT EXCHANGE (Right: Ureter)  Patient Location: PACU  Anesthesia Type:General  Level of Consciousness: awake, alert , and oriented  Airway & Oxygen Therapy: Patient Spontanous Breathing  Post-op Assessment: Report given to RN and Post -op Vital signs reviewed and stable  Post vital signs: Reviewed and stable  Last Vitals:  Vitals Value Taken Time  BP 146/56 03/20/23 0833  Temp 36.7 C 03/20/23 0833  Pulse 76 03/20/23 0836  Resp 16 03/20/23 0836  SpO2 99 % 03/20/23 0836  Vitals shown include unvalidated device data.  Last Pain:  Vitals:   03/20/23 0610  TempSrc: Oral  PainSc: 0-No pain         Complications: No notable events documented.

## 2023-03-20 NOTE — Anesthesia Postprocedure Evaluation (Signed)
Anesthesia Post Note  Patient: [name] [name]  Procedure(s) Performed: CYSTOSCOPY WITH STENT EXCHANGE (Right: Ureter)  Patient location during evaluation: PACU Anesthesia Type: General Level of consciousness: awake and alert Pain management: pain level controlled Vital Signs Assessment: post-procedure vital signs reviewed and stable Respiratory status: spontaneous breathing, nonlabored ventilation and respiratory function stable Cardiovascular status: blood pressure returned to baseline and stable Postop Assessment: no apparent nausea or vomiting Anesthetic complications: no   No notable events documented.   Last Vitals:  Vitals:   03/20/23 0900 03/20/23 0914  BP: (!) 122/59 (!) 133/59  Pulse: 68 89  Resp: (!) 8 12  Temp:  (!) 36.3 C  SpO2: 98% 97%    Last Pain:  Vitals:   03/20/23 0914  TempSrc:   PainSc: 0-No pain                 Foye Deer

## 2023-03-20 NOTE — H&P (Signed)
Urology H&P  History of Present Illness: [name] [name] is a 82 y.o. 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  Previously evaluated at [facility] and extent of cancer was not felt to be resectable and he was treated with palliative radiation.  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  He presents today for cystoscopy with exchange of right ureteral stent  Past Medical History:  Diagnosis Date   Age related osteoporosis    Anemia    Barrett's esophagus    Bradycardia    Cataract    CKD (chronic kidney disease) stage 3, GFR 30-59 ml/min    Colon polyps    DDD (degenerative disc disease), cervical    DDD (degenerative disc disease), lumbar    Femur fracture    Right   Fundic gland polyps of stomach, benign    Gastritis    GERD (gastroesophageal reflux disease)    Hematuria    Hemorrhage of rectum and anus    History of colon polyps    Hyperkalemia    Hypertension    Insomnia    Lesion of liver    going for bx at [facility] 03-15-23   Lumbago    Multiple myeloma    OSA on CPAP    Osteopenia of the elderly    PAC (premature atrial contraction)    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    RLS (restless legs syndrome)    Skin cancer    Urothelial carcinoma of distal ureter     Past Surgical History:  Procedure Laterality Date   BAND HEMORRHOIDECTOMY     COLONOSCOPY     COLONOSCOPY WITH PROPOFOL N/A 05/06/2018   Procedure: COLONOSCOPY WITH PROPOFOL;  Surgeon: Christena Deem, MD;  Location[facility] ENDOSCOPY;  Service: Endoscopy;  Laterality: N/A;   COLONOSCOPY WITH PROPOFOL N/A 09/23/2018   Procedure: COLONOSCOPY WITH PROPOFOL;  Surgeon: Christena Deem, MD;  Location: [facility] ENDOSCOPY;  Service: Endoscopy;  Laterality: N/A;   COLONOSCOPY WITH PROPOFOL N/A 03/30/2021   Procedure: COLONOSCOPY WITH PROPOFOL;  Surgeon: Toledo, Boykin Nearing, MD;   Location: [facility] ENDOSCOPY;  Service: Gastroenterology;  Laterality: N/A;   CYSTOSCOPY W/ URETERAL STENT PLACEMENT Right 03/14/2022   Procedure: CYSTOSCOPY WITH RETROGRADE PYELOGRAM/URETERAL STENT PLACEMENT;  Surgeon: [name], MD;  Location: [facility] ORS;  Service: Urology;  Laterality: Right;   ESOPHAGOGASTRODUODENOSCOPY (EGD) WITH PROPOFOL N/A 10/03/2016   Procedure: ESOPHAGOGASTRODUODENOSCOPY (EGD) WITH PROPOFOL;  Surgeon: Christena Deem, MD;  Location: [facility] ENDOSCOPY;  Service: Endoscopy;  Laterality: N/A;   EYE SURGERY     CATARACTS   EYELID SURGERY     FLEXIBLE SIGMOIDOSCOPY     FRACTURE SURGERY Right 2016   femur   FRACTURE SURGERY     ORIF DISTAL FEMUR FRACTURE     TONSILLECTOMY     TRANSURETHRAL RESECTION OF BLADDER TUMOR N/A 03/14/2022   Procedure: TRANSURETHRAL RESECTION OF BLADDER TUMOR (TURBT);  Surgeon: [name], MD;  Location: [facility] ORS;  Service: Urology;  Laterality: N/A;    Home Medications:  Current Meds  Medication Sig   acyclovir (ZOVIRAX) 200 MG capsule Take 1 capsule (200 mg total) by mouth 2 (two) times daily.   enzalutamide (XTANDI) 40 MG tablet Take 4 tablets (160 mg total) by mouth daily.   HYDROcodone-acetaminophen (NORCO) 5-325 MG tablet Take 1 tablet by mouth every 6 (six) hours as needed for moderate  pain.   ipratropium (ATROVENT) 0.03 % nasal spray Place 1 spray into both nostrils in the morning.   losartan (COZAAR) 25 MG tablet Take 25 mg by mouth every morning.   melatonin 5 MG TABS Take 5 mg by mouth at bedtime.   Meth-Hyo-M Bl-Na Phos-Ph Sal (URIBEL) 118 MG CAPS Take 1 capsule (118 mg total) by mouth 2 (two) times daily.   senna (SENOKOT) 8.6 MG TABS tablet Take 2 tablets (17.2 mg total) by mouth daily.   tamsulosin (FLOMAX) 0.4 MG CAPS capsule Take 1 capsule by mouth daily after breakfast.   traZODone (DESYREL) 50 MG tablet Take 25 mg by mouth at bedtime.   trospium (SANCTURA) 20 MG tablet Take 1 tablet (20 mg total) by mouth 2 (two)  times daily.    Allergies:  Allergies  Allergen Reactions   Ciprofloxacin Other (See Comments)    Yeast infection   Gabapentin Hives   Prolia [Denosumab] Other (See Comments)    Pain and vision loss. Pt requesting to add as an allergy.    Demerol [Meperidine] Nausea And Vomiting and Other (See Comments)    Patient passed out.   Nsaids Other (See Comments)    Not supposed to take NSAIDS per Dr [name] due to Barrett's Esophagus history Per Nephrologist, not to take NSAIDS    Family History  Problem Relation Age of Onset   Breast cancer Mother        dx 7s-50s   Prostate cancer Paternal Grandfather 34   Breast cancer Cousin     Social History:  reports that he quit smoking about 48 years ago. His smoking use included cigarettes. He has a 15.00 pack-year smoking history. He has been exposed to tobacco smoke. He has never used smokeless tobacco. He reports that he does not currently use alcohol after a past usage of about 1.0 standard drink of alcohol per week. He reports that he does not use drugs.  ROS: A complete review of systems was performed.  All systems are negative except for pertinent findings as noted.  Physical Exam:  Vital signs in last 24 hours: Temp:  [96.2 F (35.7 C)-97.4 F (36.3 C)] 97.4 F (36.3 C) (04/16 0610) Pulse Rate:  [54-60] 60 (04/16 0611) Resp:  [16-17] 17 (04/16 0610) BP: (117-122)/(57-59) 122/59 (04/16 0611) SpO2:  [99 %-100 %] 100 % (04/16 0610) Weight:  [65.2 kg] 65.2 kg (04/16 0610) Constitutional:  Alert and oriented, No acute distress HEENT: [place] AT, moist mucus membranes.  Trachea midline, no masses Cardiovascular: Regular rate and rhythm Respiratory: Normal respiratory effort, lungs clear bilaterally   Laboratory Data:  Recent Labs    03/19/23 1119  WBC 4.3  HGB 10.5*  HCT 33.0*   Recent Labs    03/19/23 1119  NA 138  K 4.5  CL 107  CO2 24  GLUCOSE 113*  BUN 39*  CREATININE 2.51*  CALCIUM 8.9   No results for  input(s): "LABPT", "INR" in the last 72 hours. No results for input(s): "LABURIN" in the last 72 hours. Results for orders placed or performed in visit on 03/08/23  CULTURE, URINE COMPREHENSIVE     Status: None   Collection Time: 03/08/23  3:25 PM   Specimen: Urine   UR  Result Value Ref Range Status   Urine Culture, Comprehensive Final report  Final   Organism ID, Bacteria Comment  Final    Comment: No growth in 36 - 48 hours.  Microscopic Examination     Status: Abnormal  Collection Time: 03/08/23  3:25 PM   Urine  Result Value Ref Range Status   WBC, UA 6-10 (A) 0 - 5 /hpf Final   RBC, Urine 0-2 0 - 2 /hpf Final   Epithelial Cells (non renal) 0-10 0 - 10 /hpf Final   Casts Present (A) None seen /lpf Final   Cast Type Granular casts (A) N/A Final   Bacteria, UA Few None seen/Few Final     Impression/Plan:  82 y.o. male with urothelial carcinoma of the distal ureter and bladder presents for cystoscopy with exchange of his right ureteral stent The procedure has been discussed in detail.  He has previously had fairly significant stent irritative symptoms which may persist.  Preop urine culture was negative.  All questions were answered   03/20/2023, 7:24 AM  Irineo Axon,  MD

## 2023-03-20 NOTE — Op Note (Signed)
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  Prostate nonocclusive with moderate bladder neck elevation.  Bladder mucosa normal in appearance without erythema, solid or papillary lesions.  No significant stent encrustation noted.  Retrograde pyelogram-moderate right hydronephrosis   EBL: Minimal  Specimens: None  Indication: [name] [name] is a 82 y.o. 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  Due to extent of disease it was felt to be unresectable at [facility] and he underwent palliative radiation.  He also has aggressive metastatic prostate cancer.  He presents today for stent exchange which was last exchanged at [facility] in November 2023.  After reviewing the management options for treatment, he elected to proceed with the above surgical procedure(s). We have discussed the potential benefits and risks of the procedure, side effects of the proposed treatment, the likelihood of the patient achieving the goals of the procedure, and any potential problems that might occur during the procedure or recuperation. Informed consent has been obtained.  Description of procedure:  The patient was taken to the operating room and general anesthesia was induced.  The patient was placed in the dorsal lithotomy position, prepped and draped in the usual sterile fashion, and preoperative antibiotics were administered. A preoperative time-out was performed.   A 21 French cystoscope was lubricated, placed per urethra and advanced proximally under direct vision with findings as described above.  A 0.038 Sensor wire  was placed through the cystoscope and into the right UO alongside the ureteral stent.  The wire was advanced into the renal pelvis under fluoroscopic guidance without difficulty.  The cystoscope was repassed and the right ureteral stent was grasped with endoscopic forceps and removed without difficulty.  A 5 French open ended ureteral catheter was placed over the guidewire and advanced to the region of the right proximal ureter.  The guidewire was removed and retrograde pyelogram was performed with findings as described above.  The guidewire was replaced and curled within a dilated upper pole calyx.  A 52F/22 cm Bard Optima ureteral stent was then placed on fluoroscopic guidance.  The proximal portion of the stent was curled within the upper pole calyx and the distal end stent was well-positioned in the bladder.  After anesthetic reversal he was transported to the PACU in stable condition.  Plan: Postop follow-up scheduled 04/19/2023   [name], M.D.

## 2023-03-21 ENCOUNTER — Encounter: Payer: Self-pay | Admitting: Urology

## 2023-03-22 LAB — MULTIPLE MYELOMA PANEL, SERUM
Albumin SerPl Elph-Mcnc: 3.7 g/dL (ref 2.9–4.4)
Albumin/Glob SerPl: 1.8 — ABNORMAL HIGH (ref 0.7–1.7)
Alpha 1: 0.3 g/dL (ref 0.0–0.4)
Alpha2 Glob SerPl Elph-Mcnc: 0.7 g/dL (ref 0.4–1.0)
B-Globulin SerPl Elph-Mcnc: 0.8 g/dL (ref 0.7–1.3)
Gamma Glob SerPl Elph-Mcnc: 0.4 g/dL (ref 0.4–1.8)
Globulin, Total: 2.1 g/dL — ABNORMAL LOW (ref 2.2–3.9)
IgA: 204 mg/dL (ref 61–437)
IgG (Immunoglobin G), Serum: 387 mg/dL — ABNORMAL LOW (ref 603–1613)
IgM (Immunoglobulin M), Srm: 45 mg/dL (ref 15–143)
Total Protein ELP: 5.8 g/dL — ABNORMAL LOW (ref 6.0–8.5)

## 2023-04-03 ENCOUNTER — Other Ambulatory Visit (HOSPITAL_COMMUNITY): Payer: Self-pay

## 2023-04-06 ENCOUNTER — Encounter: Payer: Self-pay | Admitting: Urology

## 2023-04-09 ENCOUNTER — Ambulatory Visit
Admission: RE | Admit: 2023-04-09 | Discharge: 2023-04-09 | Disposition: A | Discharge status: Home / Self Care | Payer: Medicare Other | Source: Ambulatory Visit | Attending: Oncology | Admitting: Oncology

## 2023-04-09 ENCOUNTER — Telehealth: Payer: Self-pay | Admitting: *Deleted

## 2023-04-09 ENCOUNTER — Other Ambulatory Visit: Payer: Self-pay

## 2023-04-09 DIAGNOSIS — R051 Acute cough: Secondary | ICD-10-CM

## 2023-04-09 NOTE — Telephone Encounter (Signed)
Patient called reporting that he has an appointment tomorrow and that he has " significant chest congestion and is asking if Dr Cathie Hoops wants him to get a CXR today before he sees her tomorrow. Please advise

## 2023-04-09 NOTE — Telephone Encounter (Signed)
STAT chest xray ordered. Recommended pt to get Flu/ covid tested. Pt aware.

## 2023-04-10 ENCOUNTER — Inpatient Hospital Stay (HOSPITAL_BASED_OUTPATIENT_CLINIC_OR_DEPARTMENT_OTHER): Payer: Medicare Other | Admitting: Oncology

## 2023-04-10 ENCOUNTER — Telehealth: Payer: Self-pay

## 2023-04-10 ENCOUNTER — Encounter: Payer: Self-pay | Admitting: Oncology

## 2023-04-10 ENCOUNTER — Other Ambulatory Visit: Payer: Self-pay

## 2023-04-10 ENCOUNTER — Inpatient Hospital Stay: Payer: Medicare Other | Attending: Oncology

## 2023-04-10 ENCOUNTER — Inpatient Hospital Stay: Payer: Medicare Other

## 2023-04-10 ENCOUNTER — Ambulatory Visit
Admission: RE | Admit: 2023-04-10 | Discharge: 2023-04-10 | Disposition: A | Discharge status: Home / Self Care | Payer: Medicare Other | Source: Ambulatory Visit | Attending: Oncology | Admitting: Oncology

## 2023-04-10 ENCOUNTER — Other Ambulatory Visit (HOSPITAL_COMMUNITY): Payer: Self-pay

## 2023-04-10 VITALS — BP 109/56 | HR 75 | Temp 97.4°F | Resp 18 | Wt 142.5 lb

## 2023-04-10 DIAGNOSIS — C61 Malignant neoplasm of prostate: Secondary | ICD-10-CM

## 2023-04-10 DIAGNOSIS — Z79899 Other long term (current) drug therapy: Secondary | ICD-10-CM | POA: Diagnosis not present

## 2023-04-10 DIAGNOSIS — I129 Hypertensive chronic kidney disease with stage 1 through stage 4 chronic kidney disease, or unspecified chronic kidney disease: Secondary | ICD-10-CM | POA: Insufficient documentation

## 2023-04-10 DIAGNOSIS — M810 Age-related osteoporosis without current pathological fracture: Secondary | ICD-10-CM | POA: Insufficient documentation

## 2023-04-10 DIAGNOSIS — K219 Gastro-esophageal reflux disease without esophagitis: Secondary | ICD-10-CM | POA: Diagnosis not present

## 2023-04-10 DIAGNOSIS — G4700 Insomnia, unspecified: Secondary | ICD-10-CM | POA: Diagnosis not present

## 2023-04-10 DIAGNOSIS — C782 Secondary malignant neoplasm of pleura: Secondary | ICD-10-CM | POA: Diagnosis not present

## 2023-04-10 DIAGNOSIS — N1330 Unspecified hydronephrosis: Secondary | ICD-10-CM | POA: Insufficient documentation

## 2023-04-10 DIAGNOSIS — C9000 Multiple myeloma not having achieved remission: Secondary | ICD-10-CM | POA: Diagnosis not present

## 2023-04-10 DIAGNOSIS — N476 Balanoposthitis: Secondary | ICD-10-CM | POA: Diagnosis not present

## 2023-04-10 DIAGNOSIS — D631 Anemia in chronic kidney disease: Secondary | ICD-10-CM | POA: Diagnosis not present

## 2023-04-10 DIAGNOSIS — R140 Abdominal distension (gaseous): Secondary | ICD-10-CM

## 2023-04-10 DIAGNOSIS — G893 Neoplasm related pain (acute) (chronic): Secondary | ICD-10-CM | POA: Insufficient documentation

## 2023-04-10 DIAGNOSIS — J439 Emphysema, unspecified: Secondary | ICD-10-CM | POA: Insufficient documentation

## 2023-04-10 DIAGNOSIS — M48061 Spinal stenosis, lumbar region without neurogenic claudication: Secondary | ICD-10-CM | POA: Diagnosis not present

## 2023-04-10 DIAGNOSIS — K5900 Constipation, unspecified: Secondary | ICD-10-CM | POA: Diagnosis not present

## 2023-04-10 DIAGNOSIS — J90 Pleural effusion, not elsewhere classified: Secondary | ICD-10-CM

## 2023-04-10 DIAGNOSIS — C669 Malignant neoplasm of unspecified ureter: Secondary | ICD-10-CM | POA: Diagnosis not present

## 2023-04-10 DIAGNOSIS — C787 Secondary malignant neoplasm of liver and intrahepatic bile duct: Secondary | ICD-10-CM

## 2023-04-10 DIAGNOSIS — C661 Malignant neoplasm of right ureter: Secondary | ICD-10-CM | POA: Diagnosis not present

## 2023-04-10 DIAGNOSIS — Z87891 Personal history of nicotine dependence: Secondary | ICD-10-CM | POA: Insufficient documentation

## 2023-04-10 DIAGNOSIS — Z8546 Personal history of malignant neoplasm of prostate: Secondary | ICD-10-CM | POA: Insufficient documentation

## 2023-04-10 DIAGNOSIS — N184 Chronic kidney disease, stage 4 (severe): Secondary | ICD-10-CM

## 2023-04-10 DIAGNOSIS — I700 Atherosclerosis of aorta: Secondary | ICD-10-CM | POA: Diagnosis not present

## 2023-04-10 DIAGNOSIS — D649 Anemia, unspecified: Secondary | ICD-10-CM | POA: Diagnosis not present

## 2023-04-10 LAB — CBC WITH DIFFERENTIAL (CANCER CENTER ONLY)
Abs Immature Granulocytes: 0.02 10*3/uL (ref 0.00–0.07)
Basophils Absolute: 0 10*3/uL (ref 0.0–0.1)
Basophils Relative: 0 %
Eosinophils Absolute: 0.3 10*3/uL (ref 0.0–0.5)
Eosinophils Relative: 4 %
HCT: 34.8 % — ABNORMAL LOW (ref 39.0–52.0)
Hemoglobin: 11.5 g/dL — ABNORMAL LOW (ref 13.0–17.0)
Immature Granulocytes: 0 %
Lymphocytes Relative: 10 %
Lymphs Abs: 0.6 10*3/uL — ABNORMAL LOW (ref 0.7–4.0)
MCH: 32.4 pg (ref 26.0–34.0)
MCHC: 33 g/dL (ref 30.0–36.0)
MCV: 98 fL (ref 80.0–100.0)
Monocytes Absolute: 0.6 10*3/uL (ref 0.1–1.0)
Monocytes Relative: 10 %
Neutro Abs: 4.6 10*3/uL (ref 1.7–7.7)
Neutrophils Relative %: 76 %
Platelet Count: 266 10*3/uL (ref 150–400)
RBC: 3.55 MIL/uL — ABNORMAL LOW (ref 4.22–5.81)
RDW: 12.8 % (ref 11.5–15.5)
WBC Count: 6 10*3/uL (ref 4.0–10.5)
nRBC: 0 % (ref 0.0–0.2)

## 2023-04-10 LAB — CMP (CANCER CENTER ONLY)
ALT: 26 U/L (ref 0–44)
AST: 44 U/L — ABNORMAL HIGH (ref 15–41)
Albumin: 3.3 g/dL — ABNORMAL LOW (ref 3.5–5.0)
Alkaline Phosphatase: 158 U/L — ABNORMAL HIGH (ref 38–126)
Anion gap: 11 (ref 5–15)
BUN: 44 mg/dL — ABNORMAL HIGH (ref 8–23)
CO2: 26 mmol/L (ref 22–32)
Calcium: 9.8 mg/dL (ref 8.9–10.3)
Chloride: 101 mmol/L (ref 98–111)
Creatinine: 2.57 mg/dL — ABNORMAL HIGH (ref 0.61–1.24)
GFR, Estimated: 24 mL/min — ABNORMAL LOW (ref >60)
Glucose, Bld: 138 mg/dL — ABNORMAL HIGH (ref 70–99)
Potassium: 5 mmol/L (ref 3.5–5.1)
Sodium: 138 mmol/L (ref 135–145)
Total Bilirubin: 0.6 mg/dL (ref 0.3–1.2)
Total Protein: 5.8 g/dL — ABNORMAL LOW (ref 6.5–8.1)

## 2023-04-10 LAB — PSA: Prostatic Specific Antigen: 105.23 ng/mL — ABNORMAL HIGH (ref 0.00–4.00)

## 2023-04-10 MED ORDER — ENZALUTAMIDE 40 MG PO TABS
120.0000 mg | ORAL_TABLET | Freq: Every day | ORAL | 0 refills | Status: DC
Start: 2023-04-10 — End: 2023-04-19
  Filled 2023-04-10: qty 90, 30d supply, fill #0
  Filled 2023-04-10: qty 120, 40d supply, fill #0

## 2023-04-10 NOTE — Assessment & Plan Note (Signed)
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  Expect that his myeloma will progress in the future. Currently, will prioritize his treatment for prostate cancer Continue Acyclovir 200mg  BID  -Bone health, currently he does not have an active myeloma bone involvement.  Patient reports history of developing adverse reaction [vision loss] after Prolia treatments. ophthalmologist clearance obtained for starting Xgeva. Hold Xgeva for tooth extraction.

## 2023-04-10 NOTE — Progress Notes (Addendum)
Hematology/Oncology Progress note [phone] [phone]    CHIEF COMPLAINTS/REASON FOR VISIT:  Multiple myeloma, high-grade urothelial carcinoma.metastatic prostate cancer.   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 [facility]) Staging form: Prostate, AJCC 8th Edition - Clinical: Stage IVB (cTX, cNX, pM1, PSA: 7, Grade Group: 3) - Signed by Rickard Patience, MD on 10/12/2022  Urothelial carcinoma of distal ureter (HCC) Staging form: Renal Pelvis and Ureter, AJCC 8th Edition - Clinical: Stage Unknown (cTX, cN0, cM0) - Signed by Rickard Patience, MD on 03/17/2022   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  Patient has received 3 cycles of docetaxel.  PSA has not responded well, this is a possible refractory case Reviewed PET PMSA results, liver mass biopsy were reviewed with patient, he has developed a new liver lesions, pleural lesions and splenic lesions, pathology showed prostate cancer disease progression.  Continue Xtandi 160mg  daily. Next ADT June 2024 Pluvicto at [facility]    Anemia Hemoglobin is stable.  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    Multiple myeloma (HCC) #Stage III  IgA kappa multiple myeloma, myeloma FISH panel positive for 13q deletion, gain of 1q, t (4;14), high risk, not candidate for autologous bone marrow transplant. 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  Expect that his myeloma will progress in the future. Currently, will prioritize his treatment for prostate cancer Continue Acyclovir 200mg  BID  -Bone health, currently he does not have an active myeloma bone involvement.  Patient reports history of developing adverse reaction [vision loss] after Prolia treatments. ophthalmologist clearance obtained for starting Xgeva. Hold Xgeva for tooth extraction.  Urothelial carcinoma of distal ureter (HCC) High-grade urothelial carcinoma, Cystoscopy at [facility] showed ureter high-grade carcinoma as well as bladder high-grade papillary carcinoma.S/p palliative radiation.  10/31/2022 repeat cystoscopy did not reveal any recurrence. - He has ureter stent, April 2024 s/p repeat  cystoscopy..  Can consider Rande Lawman and Padcev if recurrence develops.  Currently prostate cancer treatment is his priority.   Stage 4 chronic kidney disease (HCC) Encourage oral hydration and avoid nephrotoxins.    Pleural effusion Recommend diagnostic and therapeutic thoracentesis.    Follow-up 3 weeks.  All questions were answered. The patient knows to call the clinic with any problems, questions or concerns.  Rickard Patience, MD, PhD [facility] Hematology Oncology 04/10/2023      HISTORY OF PRESENTING ILLNESS:  [name] [name] is a 82 y.o. male presents for follow-up of multiple myeloma and high-grade urothelial carcinoma, and metastatic castration resistant prostate cancer.   Oncology History  Multiple myeloma (HCC)  03/02/2022 Imaging   PET showed No definitive signs of multiple myeloma by  FDG PET.   Obstruction  of the RIGHT ureter with soft tissue mass in the RIGHT pelvis showing increased metabolic activity suspicious first and foremost for urothelial neoplasm given location adjacent to RIGHT UVJ and affect upon the RIGHT ureter.   Little or no excretion of FDG on the RIGHT but still with uptake of FDG by renal parenchyma. Degree of hydronephrosis is not substantially changed but lack of FDG excretion on the RIGHT is compatible with secondary sign of physiologic significance of ureteral obstruction.   RIGHT pelvic sidewall uptake without visible lesion, could represent tiny lymph node not visible on noncontrast imaging.   Aortic atherosclerosis.  Pulmonary emphysema. Aortic Atherosclerosis and Emphysema    03/17/2022 Initial Diagnosis   Multiple myeloma (HCC) -Patient has progressively worsening kidney function.  He is scheduled for kidney biopsy. 01/26/2022, free kappa light chain 1058, lambda 15.3, light chain ratio 69. Protein electrophoresis showed restricted band-M spike migrating in the beta-1 globulin region. ANA negative.  ANCA negative. Random urine protein electrophoresis showed abnormal protein band detected in the gamma globulin And a second possible abnormal protein band that may represent monoclonal protein.   02/23/2022 multiple myeloma showed IgA 2883, M protein of 1.8, beta 2 microglobulin 6.9, kappa free light chain 1268.9, lambda 13.2, free light chain ratio 96.13.   CMP showed creatinine of 3.41, that is significantly worse than his baseline in June 2022. 02/28/2022, 24-hour urine protein electrophoresis showed M protein of 729 mg, IgA monoclonal kappa light chain. 03/01/2022, [facility] kidney biopsy showed diffuse light chain tubulopathy, and light chain cast nephropathy, moderate interstitial fibrosis,4% global glomerular sclerosis and the marked atherosclerosis.  03/06/2022, patient underwent a bone marrow biopsy. Pathology showed hypercellular bone marrow with  plasma cell neoplasm, representing 20% of all cells in the aspirate associated with prominent interstitial infiltrates and numerous predominantly small clusters in the clots in the biopsy sections.  The plasma cells display kappa light chain restriction consistent with plasma cell neoplasm. Normal cytogenetics, myeloma FISH panel positive for 13q deletion, gain of 1q, t (4;14)   03/17/2022 Cancer Staging   Staging form: Plasma Cell Myeloma and Plasma Cell Disorders, AJCC 8th Edition - Clinical stage from 03/17/2022: RISS Stage III (Beta-2-microglobulin (mg/L): 6.9, Albumin (g/dL): 3.3, ISS: Stage III, High-risk cytogenetics: Present, LDH: Normal) - Signed by Rickard Patience, MD on 03/17/2022 Stage prefix: Initial diagnosis Beta 2 microglobulin range (mg/L): Greater than or equal to 5.5 Albumin range (g/dL): Less than 3.5 Cytogenetics: t(4;14) translocation, Other mutation, 1q addition   03/24/2022 - 07/19/2022 Chemotherapy   Patient is on Treatment Plan : MYELOMA NEWLY DIAGNOSED Daratumumab + Dexamethasone Weekly (DaraRd) q28d     09/21/2022 Imaging   MRI lumbar spine wo contrast 1. 6 mm nodule in the cauda equina at the L4-5 level. 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 Recommend MRI of the lumbar spine with contrast for further evaluation. 2. 12 mm T2 hyperintense lesion along the inferior aspect of the S1 vertebral body. This may represent focal involvement of multiple myeloma. No other discrete lesions are present. No pathologic fracture is present. 3. Mild bilateral foraminal stenosis at L3-4 and L4-5. 4. Left paramedian annular tear at L5-S1 near the left S1 nerve roots.   12/13/2022 -  Chemotherapy   Patient is on Treatment Plan : PROSTATE Docetaxel (75) q21d     01/10/2023 - 01/10/2023 Chemotherapy   Patient is on Treatment Plan : PROSTATE Docetaxel (75) + Prednisone q21d     Urothelial carcinoma of distal ureter (HCC)  03/02/2022 Imaging   PET  showed No definitive signs of multiple myeloma by FDG PET.   Obstruction of the RIGHT ureter with soft tissue mass in the RIGHT pelvis showing increased metabolic activity suspicious first and foremost for urothelial neoplasm given location adjacent to RIGHT UVJ and affect upon the RIGHT ureter.   Little or no excretion of FDG on the RIGHT but still with uptake of FDG by renal parenchyma. Degree of hydronephrosis is not substantially changed but lack of FDG excretion on the RIGHT is compatible with secondary sign of physiologic significance of ureteral obstruction.   RIGHT pelvic sidewall uptake without visible lesion, could represent tiny lymph node not visible on noncontrast imaging.   Aortic atherosclerosis.  Pulmonary emphysema. Aortic Atherosclerosis and Emphysema    03/17/2022 Initial Diagnosis   Urothelial carcinoma of distal ureter (HCC)  -03/14/2022, cystoscopy showed diffuse narrowing right distal ureter with hydroureter following up the distal ureter and extending proximally.  Right ureteroscopy showed nodular tumor distal ureter and a convincing area without tumor suspicious for extrinsic obstruction.s/p   right ureteral stent placement. Ureteral tumor showed a tiny fragments of fibrous tissue, interpretation limited by thermal and crush artifact.  Right distal ureter saline barbotage showed hight grade urothelial carcinoma.    03/17/2022 Cancer Staging   Staging form: Renal Pelvis and Ureter, AJCC 8th Edition - Clinical: Stage Unknown (cTX, cN0, cM0) - Signed by Rickard Patience, MD on 03/17/2022 Stage prefix: Initial diagnosis WHO/ISUP grade (low/high): High Grade Histologic grading system: 2 grade system   06/05/2022 Imaging   PET scan at [facility] showed 1.Interval placement of right-sided double-J ureteral stent with resolution of hydronephrosis and improved excretion of radiotracer.  2.There is persistent abnormal hypermetabolic tissue extending posterior lateral from the right aspect of  the bladder encasing the distal ureter/stent which measures approximately 5.1 x 4.2 (CT image 243).  3.There are similar linear areas of abnormal hypermetabolic tissue seen to extend more superiorly along the right posterior pelvic wall/peritoneum, and towards the sciatic notch (fused image 238). Linear configuration suggests possible perineural spread. Note that this soft tissue abuts and may encase pelvic vasculature including the external and internal iliac arteries.  4.NEW single subcentimeter but abnormal focus of FDG avidity in the right presacral region (CT image 231) measuring 1.6 cm representing new site of neoplastic disease   06/19/2022 - 08/10/2022 Radiation Therapy   Pelvis RT 06/19/22-08/21/22 Bladder RT 07/27/22- 08/20/22    09/18/2022 Imaging   PET scan at [facility] showed 1. Evidence of disease progression with multifocal new hypermetabolic lesions around the periphery of the liver as described above. The evaluation of these lesions is limited by significant misregistration between the PET and CT portions of the examination. Contrast-enhanced CT or MRI could be used for confirmation and/or guidance of biopsy as necessary. There is also a lung parenchymal nodule with new associated FDG uptake, also suspicious for a site of metastasis.   2. Although the right nephroureteral stent appears to be well positioned, the right kidney demonstrates slightly increased hydronephrosis with significant surrounding fat stranding and relatively delayed clearance of FDG. These findings could suggest stent malfunction or be related to upper urinary tract infection. Suggest clinical correlation with any patient signs or symptoms such as right flank pain, fever, and/or urinalysis findings.     09/21/2022 Imaging   MRI lumbar spine wo contrast 1. 6 mm nodule in the cauda equina at the L4-5 level. 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  Recommend MRI of the lumbar spine with  contrast for further evaluation. 2. 12 mm T2 hyperintense lesion along the inferior aspect of the S1 vertebral body. This may represent focal involvement of multiple myeloma. No other discrete lesions are present. No pathologic fracture is present. 3. Mild bilateral foraminal stenosis at L3-4 and L4-5. 4. Left paramedian annular tear at L5-S1 near the left S1 nerve roots.    Genetic Testing   Negative genetic testing. No pathogenic variants identified on the [facility] Multi-Cancer+RNA panel. The report date is 11/19/2022.  The Multi-Cancer + RNA Panel offered by [facility] includes sequencing and/or deletion/duplication analysis of the following 70 genes:  AIP*, ALK, APC*, ATM*, AXIN2*, BAP1*, BARD1*, BLM*, BMPR1A*, BRCA1*, BRCA2*, BRIP1*, CDC73*, CDH1*, CDK4, CDKN1B*, CDKN2A, CHEK2*, CTNNA1*, DICER1*, EPCAM, EGFR, FH*, FLCN*, GREM1, HOXB13, KIT, LZTR1, MAX*, MBD4, MEN1*, MET, MITF, MLH1*, MSH2*, MSH3*, MSH6*, MUTYH*, NF1*, NF2*, NTHL1*, PALB2*, PDGFRA, PMS2*, POLD1*, POLE*, POT1*, PRKAR1A*, PTCH1*, PTEN*, RAD51C*, RAD51D*, RB1*, RET, SDHA*, SDHAF2*, SDHB*, SDHC*, SDHD*, SMAD4*, SMARCA4*, SMARCB1*, SMARCE1*, STK11*, SUFU*, TMEM127*, TP53*, TSC1*, TSC2*, VHL*. RNA analysis is performed for * genes.   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 Evidence of local prostate carcinoma within the pelvis with peritoneal implants along the bowel serosal surface as well as a metastatic LEFT iliac lymph node. 2. 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 3. Multifocal pleural  metastasis in the RIGHT hemithorax. 4. No clear evidence of skeletal metastasis      Genetic Testing   Negative genetic testing. No pathogenic variants identified on the [facility] Multi-Cancer+RNA panel. The report date is 11/19/2022.  The Multi-Cancer + RNA Panel offered by [facility] includes sequencing and/or deletion/duplication analysis of the following 70 genes:  AIP*, ALK, APC*, ATM*, AXIN2*, BAP1*, BARD1*, BLM*, BMPR1A*, BRCA1*, BRCA2*, BRIP1*, CDC73*, CDH1*, CDK4, CDKN1B*, CDKN2A, CHEK2*, CTNNA1*, DICER1*, EPCAM, EGFR, FH*, FLCN*, GREM1, HOXB13, KIT, LZTR1, MAX*, MBD4, MEN1*, MET, MITF, MLH1*, MSH2*, MSH3*, MSH6*, MUTYH*, NF1*, NF2*, NTHL1*, PALB2*, PDGFRA, PMS2*, POLD1*, POLE*, POT1*, PRKAR1A*, PTCH1*, PTEN*, RAD51C*, RAD51D*, RB1*, RET, SDHA*, SDHAF2*, SDHB*, SDHC*, SDHD*, SMAD4*, SMARCA4*, SMARCB1*, SMARCE1*, STK11*, SUFU*, TMEM127*, TP53*, TSC1*, TSC2*, VHL*. RNA analysis is performed for * genes.   12/13/2022 - 02/14/2023 Chemotherapy   Docetaxel (75) q21d  x4   02/09/2023 Imaging   PET scan at [facility] showed -Innumerable new hypermetabolic liver lesions, likely metastatic. In addition, there are new subpleural metastases and new right pleural effusion; new splenic lesion is also present.  -Focal uptake in the left basal ganglia; brain MR could be obtained if clinically indicated.  -Further decrease of excretion in the right kidney.  -Pelvic mass continues to decrease, but there is newly avid soft tissue in the pelvis near the left external iliac vessels.    03/12/2023 Imaging   PMSA PET scan done at [facility] showed -Subpleural foci, mediastinal lymph nodes, hepatic foci, splenic foci and abdominal-pelvic lymph nodes with intense Pylarify uptake, overall higher in number and somewhat different distribution compared to FDG PET/CT dated 02/09/2023, as detailed in the body of the report.   -Possible obstruction of the right kidney.  Moderate to severe right hydronephrosis.      03/14/2023 Procedure    S/p liver mass biopsy at [facility].  A: Liver, biopsy: -Adenocarcinoma, high-grade, compatible with prostatic origin.    03/16/2023 -  Chemotherapy   Started on Xtanti 160mg  daily    11/27/22 UA showed RBC >50, moderate leukocytes, Urine culture negative.  Cipro did not provide him any relieve. Was seen by symptomatic management clinic and was prescribed a course steroid and Keflex.  11/28/22 CXR showed bronchitis - URI symptom has improved.  11/29/22 US renal Mild right hydronephrosis and proximal hydroureter. Low level echoes within the visualized proximal right ureter and renal pelvis,which may reflect blood products or debris.2. Stent is partially visualized within the urinary bladder. Right ureteral jet was not definitively seen.  He was seen by [facility] urology and his symptoms were felt to be due to post treatment inflammation. He was recommended to take Flomax and NSAIDS.  Due to his CKD, we recommend him not to take NSAIDS.  Today he is here for chemotherapy treatments.  He continues to have increased urine frequently, burning with urination, and doesn't feel like he is emptying well. Also swelling of penis.  We arranged him to be seen by [facility] urology team today and his symptoms was felt to be due to stent discomfort versus underlying malignancy and constipation may also contribute to his symtoms.  Marland Kitchen He was recommended to start Gemtesa. Miralax for constipation.  Balanoposthitis, he will take one dose of diflucan.   INTERVAL HISTORY Chibuikem Pall Vicencio is a 82 y.o. male who has above history reviewed by me today presents for follow up visit for multiple myeloma and high-grade urothelial carcinoma, metastatic prostate cancer.  +dysuria/urinary urgency symptoms are better.  03/16/2023 started on Xtandi 160mg  daily.  he has increased fatigue, also reports abdominal bloating, discomfort recently.  Diarrhea has resolved. Now he has constipation, take senokot and able to have daily BM.  Feels weak. Has not  started on pleuvicto at [facility] yet, awaiting approval.    Review of Systems  Constitutional:  Positive for fatigue. Negative for appetite change, chills, fever and unexpected weight change.  HENT:   Negative for hearing loss and voice change.   Eyes:  Negative for eye problems and icterus.  Respiratory:  Negative for chest tightness, cough and shortness of breath.   Cardiovascular:  Negative for chest pain and leg swelling.  Gastrointestinal:  Positive for abdominal distention, abdominal pain and constipation. Negative for diarrhea.  Endocrine: Negative for hot flashes.  Genitourinary:  Positive for dysuria and frequency. Negative for difficulty urinating.   Musculoskeletal:  Negative for arthralgias.  Skin:  Negative for itching.  Neurological:  Negative for light-headedness and numbness.  Hematological:  Negative for adenopathy. Does not bruise/bleed easily.  Psychiatric/Behavioral:  Negative for confusion.      MEDICAL HISTORY:  Past Medical History:  Diagnosis Date   Age related osteoporosis    Anemia    Barrett's esophagus    Bradycardia    Cataract    CKD (chronic kidney disease) stage 3, GFR 30-59 ml/min (HCC)    Colon polyps    DDD (degenerative disc disease), cervical    DDD (degenerative disc disease), lumbar    Femur fracture (HCC)    Right   Fundic gland polyps of stomach, benign    Gastritis    GERD (gastroesophageal reflux disease)    Hematuria    Hemorrhage of rectum and anus    History of colon polyps  Hyperkalemia    Hypertension    Insomnia    Lesion of liver    going for bx at [facility] 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)    RLS (restless legs syndrome)    Skin cancer    Urothelial carcinoma of distal ureter (HCC)     SURGICAL HISTORY: Past Surgical History:  Procedure Laterality Date   BAND  HEMORRHOIDECTOMY     COLONOSCOPY     COLONOSCOPY WITH PROPOFOL N/A 05/06/2018   Procedure: COLONOSCOPY WITH PROPOFOL;  Surgeon: Christena Deem, MD;  Location: [facility] ENDOSCOPY;  Service: Endoscopy;  Laterality: N/A;   COLONOSCOPY WITH PROPOFOL N/A 09/23/2018   Procedure: COLONOSCOPY WITH PROPOFOL;  Surgeon: Christena Deem, MD;  Location: [facility] ENDOSCOPY;  Service: Endoscopy;  Laterality: N/A;   COLONOSCOPY WITH PROPOFOL N/A 03/30/2021   Procedure: COLONOSCOPY WITH PROPOFOL;  Surgeon: Toledo, Boykin Nearing, MD;  Location: [facility] ENDOSCOPY;  Service: Gastroenterology;  Laterality: N/A;   CYSTOSCOPY W/ URETERAL STENT PLACEMENT Right 03/14/2022   Procedure: CYSTOSCOPY WITH RETROGRADE PYELOGRAM/URETERAL STENT PLACEMENT;  Surgeon: [name], MD;  Location: [facility] ORS;  Service: Urology;  Laterality: Right;   CYSTOSCOPY W/ URETERAL STENT PLACEMENT Right 03/20/2023   Procedure: CYSTOSCOPY WITH STENT EXCHANGE;  Surgeon: [name], MD;  Location: [facility] ORS;  Service: Urology;  Laterality: Right;   ESOPHAGOGASTRODUODENOSCOPY (EGD) WITH PROPOFOL N/A 10/03/2016   Procedure: ESOPHAGOGASTRODUODENOSCOPY (EGD) WITH PROPOFOL;  Surgeon: Christena Deem, MD;  Location: [facility] ENDOSCOPY;  Service: Endoscopy;  Laterality: N/A;   EYE SURGERY     CATARACTS   EYELID SURGERY     FLEXIBLE SIGMOIDOSCOPY     FRACTURE SURGERY Right 2016   femur   FRACTURE SURGERY     ORIF DISTAL FEMUR FRACTURE     TONSILLECTOMY     TRANSURETHRAL RESECTION OF BLADDER TUMOR N/A 03/14/2022   Procedure: TRANSURETHRAL RESECTION OF BLADDER TUMOR (TURBT);  Surgeon: [name], MD;  Location: [facility] ORS;  Service: Urology;  Laterality: N/A;    SOCIAL HISTORY: Social History   Socioeconomic History   Marital status: Married    Spouse name: Not on file   Number of children: Not on file   Years of education: Not on file   Highest education level: Not on file  Occupational History   Not on file  Tobacco Use   Smoking status:  Former    Packs/day: 1.00    Years: 15.00    Additional pack years: 0.00    Total pack years: 15.00    Types: Cigarettes    Quit date: 10/03/1974    Years since quitting: 48.5    Passive exposure: Past   Smokeless tobacco: Never  Vaping Use   Vaping Use: Never used  Substance and Sexual Activity   Alcohol use: Not Currently    Alcohol/week: 1.0 standard drink of alcohol    Types: 1 Cans of beer per week    Comment: 1 beer every 2 weeks   Drug use: No   Sexual activity: Yes    Birth control/protection: None  Other Topics Concern   Not on file  Social History Narrative   ** Merged History Encounter **       Social Determinants of Health   Financial Resource Strain: Medium Risk (01/22/2023)   Overall Financial  Resource Strain (CARDIA)    Difficulty of Paying Living Expenses: Somewhat hard  Food Insecurity: No Food Insecurity (01/22/2023)   Hunger Vital Sign    Worried About Running Out of Food in the Last Year: Never true    Ran Out of Food in the Last Year: Never true  Transportation Needs: No Transportation Needs (01/22/2023)   PRAPARE - Administrator, Civil Service (Medical): No    Lack of Transportation (Non-Medical): No  Physical Activity: Inactive (01/22/2023)   Exercise Vital Sign    Days of Exercise per Week: 0 days    Minutes of Exercise per Session: 0 min  Stress: Stress Concern Present (01/22/2023)   [employer] of Occupational Health - Occupational Stress Questionnaire    Feeling of Stress : To some extent  Social Connections: Socially Integrated (01/22/2023)   Social Connection and Isolation Panel [NHANES]    Frequency of Communication with Friends and Family: More than three times a week    Frequency of Social Gatherings with Friends and Family: More than three times a week    Attends Religious Services: 1 to 4 times per year    Active Member of [employer] or Organizations: No    Attends Engineer, structural: 1 to 4 times per year     Marital Status: Married  Catering manager Violence: Not At Risk (01/22/2023)   Humiliation, Afraid, Rape, and Kick questionnaire    Fear of Current or Ex-Partner: No    Emotionally Abused: No    Physically Abused: No    Sexually Abused: No    FAMILY HISTORY: Family History  Problem Relation Age of Onset   Breast cancer Mother        dx 48s-50s   Prostate cancer Paternal Grandfather 21   Breast cancer Cousin     ALLERGIES:  is allergic to ciprofloxacin, gabapentin, prolia [denosumab], demerol [meperidine], and nsaids.  MEDICATIONS:  Current Outpatient Medications  Medication Sig Dispense Refill   acyclovir (ZOVIRAX) 200 MG capsule Take 1 capsule (200 mg total) by mouth 2 (two) times daily. 60 capsule 3   Calcium Carbonate (CALCIUM 500 PO) Take 1 tablet by mouth daily.     Cholecalciferol (VITAMIN D3) 125 MCG (5000 UT) CAPS Take 5,000 Units by mouth daily.     enzalutamide (XTANDI) 40 MG tablet Take 4 tablets (160 mg total) by mouth daily. 120 tablet 0   ferrous sulfate 325 (65 FE) MG tablet Take 325 mg by mouth daily.     HYDROcodone-acetaminophen (NORCO) 5-325 MG tablet Take 1 tablet by mouth every 6 (six) hours as needed for moderate pain. 30 tablet 0   ipratropium (ATROVENT) 0.03 % nasal spray Place 1 spray into both nostrils in the morning.     losartan (COZAAR) 25 MG tablet Take 25 mg by mouth every morning.     melatonin 5 MG TABS Take 5 mg by mouth at bedtime.     Meth-Hyo-M Bl-Na Phos-Ph Sal (URIBEL) 118 MG CAPS Take 1 capsule (118 mg total) by mouth 2 (two) times daily. 60 capsule 1   Multiple Vitamins-Minerals (MENS 50+ MULTIVITAMIN) TABS Take 1 tablet by mouth daily.     nystatin cream (MYCOSTATIN) Apply 1 Application topically 2 (two) times daily. 30 g 0   senna (SENOKOT) 8.6 MG TABS tablet Take 2 tablets (17.2 mg total) by mouth daily. 120 tablet 0   tamsulosin (FLOMAX) 0.4 MG CAPS capsule Take 1 capsule by mouth daily after breakfast.  traZODone (DESYREL) 50 MG  tablet Take 25 mg by mouth at bedtime.     trospium (SANCTURA) 20 MG tablet Take 1 tablet (20 mg total) by mouth 2 (two) times daily. 60 tablet 0   No current facility-administered medications for this visit.     PHYSICAL EXAMINATION: ECOG PERFORMANCE STATUS: 1 - Symptomatic but completely ambulatory Today's Vitals   04/10/23 0946  BP: (!) 109/56  Pulse: 75  Resp: 18  Temp: (!) 97.4 F (36.3 C)  Weight: 142 lb 8 oz (64.6 kg)  PainSc: 3   PainLoc: Back   Body mass index is 22.32 kg/m.   Physical Exam Constitutional:      General: He is not in acute distress. HENT:     Head: Normocephalic and atraumatic.  Eyes:     General: No scleral icterus. Cardiovascular:     Rate and Rhythm: Normal rate.  Pulmonary:     Effort: Pulmonary effort is normal. No respiratory distress.     Breath sounds: No wheezing.  Abdominal:     General: There is distension.     Palpations: Abdomen is soft.     Tenderness: There is abdominal tenderness.  Musculoskeletal:        General: No deformity. Normal range of motion.     Cervical back: Normal range of motion and neck supple.  Skin:    General: Skin is warm and dry.     Findings: No erythema.  Neurological:     Mental Status: He is alert and oriented to person, place, and time. Mental status is at baseline.     Cranial Nerves: No cranial nerve deficit.  Psychiatric:        Mood and Affect: Mood normal.      LABORATORY DATA:  I have reviewed the data as listed    Latest Ref Rng & Units 04/10/2023    9:27 AM 03/19/2023   11:19 AM 02/14/2023    8:33 AM  CBC  WBC 4.0 - 10.5 K/uL 6.0  4.3  12.7   Hemoglobin 13.0 - 17.0 g/dL 95.6  21.3  9.4   Hematocrit 39.0 - 52.0 % 34.8  33.0  29.1   Platelets 150 - 400 K/uL 266  184  245        Latest Ref Rng & Units 03/19/2023   11:19 AM 03/13/2023   11:25 AM 02/14/2023    8:33 AM  CMP  Glucose 70 - 99 mg/dL 086  578  469   BUN 8 - 23 mg/dL 39  35  46   Creatinine 0.61 - 1.24 mg/dL 6.29  5.28   4.13   Sodium 135 - 145 mmol/L 138  138  137   Potassium 3.5 - 5.1 mmol/L 4.5  4.6  4.4   Chloride 98 - 111 mmol/L 107  107  105   CO2 22 - 32 mmol/L 24  25  24    Calcium 8.9 - 10.3 mg/dL 8.9  9.2  8.9   Total Protein 6.5 - 8.1 g/dL 6.1   5.8   Total Bilirubin 0.3 - 1.2 mg/dL 0.5   0.5   Alkaline Phos 38 - 126 U/L 110   82   AST 15 - 41 U/L 32   21   ALT 0 - 44 U/L 35   14     Iron/TIBC/Ferritin/ %Sat    Component Value Date/Time   IRON 64 01/24/2023 0835   TIBC 262 01/24/2023 0835   FERRITIN 168 01/24/2023 0835  IRONPCTSAT 24 01/24/2023 0835       RADIOGRAPHIC STUDIES: I have personally reviewed the radiological images as listed and agreed with the findings in the report.  DG Chest 2 View  Result Date: 04/09/2023 CLINICAL DATA:  Shortness of breath, congestion. EXAM: CHEST - 2 VIEW COMPARISON:  November 28, 2022.  December 05, 2022. FINDINGS: The heart size and mediastinal contours are within normal limits. Large pleural based mass is noted anteriorly in right upper lobe. Moderate right pleural effusion is noted with associated right basilar atelectasis. Left lung is grossly unremarkable. The visualized skeletal structures are unremarkable. IMPRESSION: Moderate right pleural effusion is noted with associated right basilar atelectasis. Pleural based mass is noted anteriorly in right upper lobe consistent with metastatic disease as described on prior PET scan. Electronically Signed   By: Lupita Raider M.D.   On: 04/09/2023 16:29   DG OR UROLOGY CYSTO IMAGE ([facility] ONLY)  Result Date: 03/20/2023 There is no interpretation for this exam.  This order is for images obtained during a surgical procedure.  Please See "Surgeries" Tab for more information regarding the procedure.

## 2023-04-10 NOTE — Assessment & Plan Note (Signed)
High-grade urothelial carcinoma, Cystoscopy at [facility] showed ureter high-grade carcinoma as well as bladder high-grade papillary carcinoma.S/p palliative radiation.  10/31/2022 repeat cystoscopy did not reveal any recurrence. - He has ureter stent, April 2024 s/p repeat  cystoscopy..  Can consider Rande Lawman and Padcev if recurrence develops.  Currently prostate cancer treatment is his priority.

## 2023-04-10 NOTE — Telephone Encounter (Signed)
Pt has been scheduled for 5/8 @ 8:30, arrive 8a. Pt aware of appt.

## 2023-04-10 NOTE — Assessment & Plan Note (Signed)
Encourage oral hydration and avoid nephrotoxins.   

## 2023-04-10 NOTE — Assessment & Plan Note (Addendum)
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  Patient has received 3 cycles of docetaxel.  PSA has not responded well, this is a possible refractory case Reviewed PET PMSA results, liver mass biopsy were reviewed with patient, he has developed a new liver lesions, pleural lesions and splenic lesions, pathology showed prostate cancer disease progression.  Decrease Xtandi 120 mg daily due to severe fatigue.  Next ADT June 2024 Plan Pluvicto at [facility], discussed with Dr.Milowsky, still waiting for approval.

## 2023-04-10 NOTE — Assessment & Plan Note (Signed)
Bloating and pain.  Abdomen Xray showed no obstruction. Suspect symptoms are related to his metastatic disease.  Check abdomen pelvis CT with oral contrast.

## 2023-04-10 NOTE — Assessment & Plan Note (Signed)
Hemoglobin is stable.  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   

## 2023-04-10 NOTE — Progress Notes (Signed)
Nutrition Assessment   Reason for Assessment:  Decreased appetite   ASSESSMENT:  82 year old male currently being treated for metastatic prostate cancer.  Past medical history of multiple myeloma, urothelial cancer, CKD stage 3, HTN, barrett's esophagus.  Patient recently started xtandi on 4/12  Met with patient and wife following MD visit.  Patient reports no appetite and reduce volume of food.  Breakfast is usually egg sandwich or cereal.  May drink a nepro around lunch.  Dinner maybe chicken, or salmon with vegetables.  Reports taste alterations as well effecting intake.  Reports constipation and taking senna and eating 4 prunes with probiotics.     Medications: reviewed   Labs: glucose 138, BUN 44, creatinine 2.57   Anthropometrics:   Height: 67 inches Weight: 142 lb 8 oz  147 lb on 4/5 BMI: 22  3% weight loss in the last month Estimated Energy Needs  Kcals: 1600-1920 Protein: 80-96 g Fluid: 1600-1920 ml   NUTRITION DIAGNOSIS: Inadequate oral intake related to cancer and related treatment side effects as evidenced by 3% weight loss in the last month and decreased appetite   INTERVENTION:  Discussed ways to add calories and protein to diet. Handout provided Encouraged nibbling q 2 hours Recommend to increase nepro 2-3 times a day if able for added calories Discussed strategies to help with taste change Contact information provided   MONITORING, EVALUATION, GOAL: weight trends, intake   Next Visit: to be determined  Kamrynn Melott B. Freida Busman, RD, LDN Registered Dietitian [phone]

## 2023-04-10 NOTE — Addendum Note (Signed)
Addended by: Coralee Rud on: 04/10/2023 01:35 PM   Modules accepted: Orders

## 2023-04-10 NOTE — Telephone Encounter (Signed)
-----   Message from Rickard Patience, MD sent at 04/09/2023  8:10 PM EDT ----- My chart message sent.  Need diagnostic thoracentesis of right side

## 2023-04-10 NOTE — Progress Notes (Signed)
Patient here for follow up. Pt reports new onset of lumbar back pain, that has been going on for about 2 weeks. He has a yeast infection and swelling. Pt also reports weakness.

## 2023-04-10 NOTE — Assessment & Plan Note (Signed)
Recommend diagnostic and therapeutic thoracentesis.

## 2023-04-10 NOTE — Telephone Encounter (Signed)
Request for Thoracentesis faxed to IR.

## 2023-04-11 ENCOUNTER — Ambulatory Visit
Admission: RE | Admit: 2023-04-11 | Discharge: 2023-04-11 | Disposition: A | Discharge status: Home / Self Care | Payer: Medicare Other | Source: Ambulatory Visit | Attending: Oncology | Admitting: Oncology

## 2023-04-11 ENCOUNTER — Other Ambulatory Visit: Payer: Self-pay

## 2023-04-11 ENCOUNTER — Ambulatory Visit
Admission: RE | Admit: 2023-04-11 | Discharge: 2023-04-11 | Disposition: A | Discharge status: Home / Self Care | Payer: Medicare Other | Source: Ambulatory Visit | Attending: Internal Medicine | Admitting: Internal Medicine

## 2023-04-11 DIAGNOSIS — J90 Pleural effusion, not elsewhere classified: Secondary | ICD-10-CM

## 2023-04-11 DIAGNOSIS — C61 Malignant neoplasm of prostate: Secondary | ICD-10-CM | POA: Diagnosis not present

## 2023-04-11 DIAGNOSIS — C787 Secondary malignant neoplasm of liver and intrahepatic bile duct: Secondary | ICD-10-CM | POA: Insufficient documentation

## 2023-04-11 LAB — BODY FLUID CELL COUNT WITH DIFFERENTIAL
Eos, Fluid: 1 %
Lymphs, Fluid: 75 %
Monocyte-Macrophage-Serous Fluid: 8 %
Neutrophil Count, Fluid: 16 %
Total Nucleated Cell Count, Fluid: 551 cu mm

## 2023-04-11 LAB — LACTATE DEHYDROGENASE, PLEURAL OR PERITONEAL FLUID: LD, Fluid: 441 U/L — ABNORMAL HIGH (ref 3–23)

## 2023-04-11 LAB — PROTEIN, PLEURAL OR PERITONEAL FLUID: Total protein, fluid: 3.5 g/dL

## 2023-04-11 LAB — ALBUMIN, PLEURAL OR PERITONEAL FLUID: Albumin, Fluid: 2.5 g/dL

## 2023-04-11 LAB — PATHOLOGIST SMEAR REVIEW

## 2023-04-11 LAB — BODY FLUID CULTURE W GRAM STAIN

## 2023-04-11 MED ORDER — LIDOCAINE HCL (PF) 1 % IJ SOLN
10.0000 mL | Freq: Once | INTRAMUSCULAR | Status: AC
  Administered 2023-04-11: 10 mL via INTRADERMAL
  Filled 2023-04-11: qty 10

## 2023-04-11 MED ORDER — IOHEXOL 9 MG/ML PO SOLN
500.0000 mL | ORAL | Status: AC
  Administered 2023-04-11: 500 mL via ORAL

## 2023-04-11 NOTE — Discharge Instructions (Signed)
Reviewed and given to patient

## 2023-04-11 NOTE — Procedures (Signed)
PROCEDURE SUMMARY:  Successful US guided diagnsotic and therapeutic right thoracentesis. Yielded 2 L of clear, amber fluid. Pt tolerated procedure well. No immediate complications.  Specimen was sent for labs. CXR ordered.  EBL < 1 mL  Shon Hough, AGNP 04/11/2023 9:34 AM

## 2023-04-13 ENCOUNTER — Telehealth: Payer: Self-pay

## 2023-04-13 LAB — BODY FLUID CULTURE W GRAM STAIN: Culture: NO GROWTH

## 2023-04-13 NOTE — Telephone Encounter (Signed)
Patient called stating he would like to speak with MD because he is having some side effects to the new medication Xtandi. Patient states Dr.Milowsky would like to change this medication to Tibsovo. Callback # [phone].

## 2023-04-14 LAB — BODY FLUID CULTURE W GRAM STAIN: Gram Stain: NONE SEEN

## 2023-04-16 ENCOUNTER — Telehealth: Payer: Self-pay

## 2023-04-16 NOTE — Telephone Encounter (Signed)
Patient called stating he needs a refill for his acyclovir. He isnt sure if he should stay on it but he does know he is out of it. Patient would like to be seen by MD because since the xtandi he has noticed some Abdominal bloating. Callback [phone].

## 2023-04-17 NOTE — Telephone Encounter (Signed)
Please schedule patient for lab/ Josh ([facility])/ IVF on thurs am. Spoke with heather and she is ok with this time

## 2023-04-18 ENCOUNTER — Other Ambulatory Visit: Payer: Self-pay

## 2023-04-18 DIAGNOSIS — C61 Malignant neoplasm of prostate: Secondary | ICD-10-CM

## 2023-04-19 ENCOUNTER — Inpatient Hospital Stay: Payer: Medicare Other

## 2023-04-19 ENCOUNTER — Ambulatory Visit
Admission: RE | Admit: 2023-04-19 | Discharge: 2023-04-19 | Disposition: A | Discharge status: Home / Self Care | Payer: Medicare Other | Source: Ambulatory Visit | Attending: Hospice and Palliative Medicine | Admitting: Hospice and Palliative Medicine

## 2023-04-19 ENCOUNTER — Inpatient Hospital Stay (HOSPITAL_BASED_OUTPATIENT_CLINIC_OR_DEPARTMENT_OTHER): Payer: Medicare Other | Admitting: Hospice and Palliative Medicine

## 2023-04-19 ENCOUNTER — Ambulatory Visit: Payer: Medicare Other | Admitting: Urology

## 2023-04-19 ENCOUNTER — Telehealth: Payer: Self-pay | Admitting: *Deleted

## 2023-04-19 ENCOUNTER — Other Ambulatory Visit: Payer: Self-pay

## 2023-04-19 ENCOUNTER — Encounter: Payer: Self-pay | Admitting: Hospice and Palliative Medicine

## 2023-04-19 ENCOUNTER — Ambulatory Visit
Admission: RE | Admit: 2023-04-19 | Discharge: 2023-04-19 | Disposition: A | Discharge status: Home / Self Care | Payer: Medicare Other | Source: Ambulatory Visit | Attending: Student | Admitting: Student

## 2023-04-19 ENCOUNTER — Other Ambulatory Visit: Payer: Self-pay | Admitting: Student

## 2023-04-19 VITALS — BP 113/62 | HR 64 | Temp 97.9°F | Resp 20 | Ht 67.0 in | Wt 142.4 lb

## 2023-04-19 DIAGNOSIS — J90 Pleural effusion, not elsewhere classified: Secondary | ICD-10-CM | POA: Insufficient documentation

## 2023-04-19 DIAGNOSIS — R180 Malignant ascites: Secondary | ICD-10-CM | POA: Diagnosis present

## 2023-04-19 DIAGNOSIS — Z9889 Other specified postprocedural states: Secondary | ICD-10-CM

## 2023-04-19 DIAGNOSIS — C61 Malignant neoplasm of prostate: Secondary | ICD-10-CM

## 2023-04-19 DIAGNOSIS — C9000 Multiple myeloma not having achieved remission: Secondary | ICD-10-CM | POA: Diagnosis not present

## 2023-04-19 DIAGNOSIS — R140 Abdominal distension (gaseous): Secondary | ICD-10-CM

## 2023-04-19 DIAGNOSIS — C787 Secondary malignant neoplasm of liver and intrahepatic bile duct: Secondary | ICD-10-CM | POA: Diagnosis present

## 2023-04-19 LAB — CMP (CANCER CENTER ONLY)
ALT: 18 U/L (ref 0–44)
AST: 41 U/L (ref 15–41)
Albumin: 3 g/dL — ABNORMAL LOW (ref 3.5–5.0)
Alkaline Phosphatase: 150 U/L — ABNORMAL HIGH (ref 38–126)
Anion gap: 11 (ref 5–15)
BUN: 45 mg/dL — ABNORMAL HIGH (ref 8–23)
CO2: 23 mmol/L (ref 22–32)
Calcium: 9 mg/dL (ref 8.9–10.3)
Chloride: 101 mmol/L (ref 98–111)
Creatinine: 2.38 mg/dL — ABNORMAL HIGH (ref 0.61–1.24)
GFR, Estimated: 26 mL/min — ABNORMAL LOW (ref >60)
Glucose, Bld: 120 mg/dL — ABNORMAL HIGH (ref 70–99)
Potassium: 4.3 mmol/L (ref 3.5–5.1)
Sodium: 135 mmol/L (ref 135–145)
Total Bilirubin: 0.5 mg/dL (ref 0.3–1.2)
Total Protein: 5.6 g/dL — ABNORMAL LOW (ref 6.5–8.1)

## 2023-04-19 LAB — CBC WITH DIFFERENTIAL (CANCER CENTER ONLY)
Abs Immature Granulocytes: 0.02 10*3/uL (ref 0.00–0.07)
Basophils Absolute: 0 10*3/uL (ref 0.0–0.1)
Basophils Relative: 0 %
Eosinophils Absolute: 0.2 10*3/uL (ref 0.0–0.5)
Eosinophils Relative: 4 %
HCT: 36.4 % — ABNORMAL LOW (ref 39.0–52.0)
Hemoglobin: 12 g/dL — ABNORMAL LOW (ref 13.0–17.0)
Immature Granulocytes: 0 %
Lymphocytes Relative: 8 %
Lymphs Abs: 0.5 10*3/uL — ABNORMAL LOW (ref 0.7–4.0)
MCH: 32.3 pg (ref 26.0–34.0)
MCHC: 33 g/dL (ref 30.0–36.0)
MCV: 98.1 fL (ref 80.0–100.0)
Monocytes Absolute: 0.6 10*3/uL (ref 0.1–1.0)
Monocytes Relative: 10 %
Neutro Abs: 4.7 10*3/uL (ref 1.7–7.7)
Neutrophils Relative %: 78 %
Platelet Count: 312 10*3/uL (ref 150–400)
RBC: 3.71 MIL/uL — ABNORMAL LOW (ref 4.22–5.81)
RDW: 12.7 % (ref 11.5–15.5)
WBC Count: 6.2 10*3/uL (ref 4.0–10.5)
nRBC: 0 % (ref 0.0–0.2)

## 2023-04-19 MED ORDER — DEXAMETHASONE 1 MG PO TABS: 1.0000 mg | ORAL_TABLET | Freq: Two times a day (BID) | ORAL | 1 refills | Status: DC

## 2023-04-19 MED ORDER — LIDOCAINE HCL (PF) 1 % IJ SOLN
10.0000 mL | Freq: Once | INTRAMUSCULAR | Status: AC
  Administered 2023-04-19: 10 mL via INTRADERMAL
  Filled 2023-04-19: qty 10

## 2023-04-19 NOTE — Progress Notes (Signed)
[facility] Telephone:([phone]) [phone] [phone]  Patient Care Team: [name], MD as PCP - General (Internal Medicine) Carmina Miller, MD as Radiation Oncologist (Radiation Oncology) [name], MD (Internal Medicine) Lamont Dowdy, MD as Consulting Physician (Nephrology) Rickard Patience, MD as Consulting Physician (Oncology)   NAME OF PATIENT: [name] [name]  [id number]  11/23/1941   DATE OF VISIT: 04/19/23  REASON FOR CONSULT: [name] [name] is a 82 y.o. 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   INTERVAL HISTORY: Patient received 3 cycles of docetaxel with clear progression on PMSA PET.  He is referred to [facility] for Pluvicto.  Patient recently discontinue Xtandi.  Patient presents to clinic today for abdominal "bloat".  Patient has had progressive abdominal distention over the last several weeks.  He completed CT last week to rule out obstruction.  Patient has also had shortness of breath and had right-sided thoracentesis last week with 2 L fluid removed.  Patient feels like his pleural effusion has reaccumulated.  Patient endorses poor oral intake, fatigue.  Overall, patient and wife feel like he is declining.  Patient says that he was told by [facility] that Pluvicto was not an option.  Wife asks about hospice today.  Denies any neurologic complaints. Denies recent fevers or illnesses. Denies any easy bleeding or bruising.  Denies chest pain. Denies any nausea, vomiting, diarrhea.  Patient does endorse some constipation but had a regular bowel movement this morning.  Denies urinary complaints. Patient offers no further specific complaints today.   PAST MEDICAL HISTORY: Past Medical History:  Diagnosis Date   Age related osteoporosis    Anemia    Barrett's esophagus     Bradycardia    Cataract    CKD (chronic kidney disease) stage 3, GFR 30-59 ml/min (HCC)    Colon polyps    DDD (degenerative disc disease), cervical    DDD (degenerative disc disease), lumbar    Femur fracture (HCC)    Right   Fundic gland polyps of stomach, benign    Gastritis    GERD (gastroesophageal reflux disease)    Hematuria    Hemorrhage of rectum and anus    History of colon polyps    Hyperkalemia    Hypertension    Insomnia    Lesion of liver    going for bx at [facility] 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)    RLS (restless legs syndrome)    Skin cancer    Urothelial carcinoma of distal ureter (HCC)     PAST SURGICAL HISTORY:  Past Surgical History:  Procedure Laterality Date   BAND HEMORRHOIDECTOMY     COLONOSCOPY     COLONOSCOPY WITH PROPOFOL N/A 05/06/2018   Procedure: COLONOSCOPY WITH PROPOFOL;  Surgeon: Christena Deem, MD;  Location: [facility] ENDOSCOPY;  Service: Endoscopy;  Laterality: N/A;   COLONOSCOPY WITH PROPOFOL N/A 09/23/2018   Procedure: COLONOSCOPY WITH PROPOFOL;  Surgeon: Christena Deem, MD;  Location: [facility] ENDOSCOPY;  Service: Endoscopy;  Laterality: N/A;   COLONOSCOPY WITH PROPOFOL N/A 03/30/2021   Procedure: COLONOSCOPY WITH PROPOFOL;  Surgeon: Toledo, Boykin Nearing, MD;  Location: [facility] ENDOSCOPY;  Service: Gastroenterology;  Laterality: N/A;  CYSTOSCOPY W/ URETERAL STENT PLACEMENT Right 03/14/2022   Procedure: CYSTOSCOPY WITH RETROGRADE PYELOGRAM/URETERAL STENT PLACEMENT;  Surgeon: [name], MD;  Location: [facility] ORS;  Service: Urology;  Laterality: Right;   CYSTOSCOPY W/ URETERAL STENT PLACEMENT Right 03/20/2023   Procedure: CYSTOSCOPY WITH STENT EXCHANGE;  Surgeon: [name], MD;  Location: [facility] ORS;  Service: Urology;  Laterality: Right;   ESOPHAGOGASTRODUODENOSCOPY (EGD) WITH PROPOFOL N/A  10/03/2016   Procedure: ESOPHAGOGASTRODUODENOSCOPY (EGD) WITH PROPOFOL;  Surgeon: Christena Deem, MD;  Location: [facility] ENDOSCOPY;  Service: Endoscopy;  Laterality: N/A;   EYE SURGERY     CATARACTS   EYELID SURGERY     FLEXIBLE SIGMOIDOSCOPY     FRACTURE SURGERY Right 2016   femur   FRACTURE SURGERY     ORIF DISTAL FEMUR FRACTURE     TONSILLECTOMY     TRANSURETHRAL RESECTION OF BLADDER TUMOR N/A 03/14/2022   Procedure: TRANSURETHRAL RESECTION OF BLADDER TUMOR (TURBT);  Surgeon: [name], MD;  Location: [facility] ORS;  Service: Urology;  Laterality: N/A;    HEMATOLOGY/ONCOLOGY HISTORY:  Oncology History  Multiple myeloma (HCC)  03/02/2022 Imaging   PET showed No definitive signs of multiple myeloma by FDG PET.   Obstruction of the RIGHT ureter with soft tissue mass in the RIGHT pelvis showing increased metabolic activity suspicious first and foremost for urothelial neoplasm given location adjacent to RIGHT UVJ and affect upon the RIGHT ureter.   Little or no excretion of FDG on the RIGHT but still with uptake of FDG by renal parenchyma. Degree of hydronephrosis is not substantially changed but lack of FDG excretion on the RIGHT is compatible with secondary sign of physiologic significance of ureteral obstruction.   RIGHT pelvic sidewall uptake without visible lesion, could represent tiny lymph node not visible on noncontrast imaging.   Aortic atherosclerosis.  Pulmonary emphysema. Aortic Atherosclerosis and Emphysema    03/17/2022 Initial Diagnosis   Multiple myeloma (HCC) -Patient has progressively worsening kidney function.  He is scheduled for kidney biopsy. 01/26/2022, free kappa light chain 1058, lambda 15.3, light chain ratio 69. Protein electrophoresis showed restricted band-M spike migrating in the beta-1 globulin region. ANA negative.  ANCA negative. Random urine protein electrophoresis showed abnormal protein band detected in the gamma globulin And a second possible  abnormal protein band that may represent monoclonal protein.   02/23/2022 multiple myeloma showed IgA 2883, M protein of 1.8, beta 2 microglobulin 6.9, kappa free light chain 1268.9, lambda 13.2, free light chain ratio 96.13.   CMP showed creatinine of 3.41, that is significantly worse than his baseline in June 2022. 02/28/2022, 24-hour urine protein electrophoresis showed M protein of 729 mg, IgA monoclonal kappa light chain. 03/01/2022, [facility] kidney biopsy showed diffuse light chain tubulopathy, and light chain cast nephropathy, moderate interstitial fibrosis,4% global glomerular sclerosis and the marked atherosclerosis.  03/06/2022, patient underwent a bone marrow biopsy. Pathology showed hypercellular bone marrow with plasma cell neoplasm, representing 20% of all cells in the aspirate associated with prominent interstitial infiltrates and numerous predominantly small clusters in the clots in the biopsy sections.  The plasma cells display kappa light chain restriction consistent with plasma cell neoplasm. Normal cytogenetics, myeloma FISH panel positive for 13q deletion, gain of 1q, t (4;14)   03/17/2022 Cancer Staging   Staging form: Plasma Cell Myeloma and Plasma Cell Disorders, AJCC 8th Edition - Clinical stage from 03/17/2022: RISS Stage III (Beta-2-microglobulin (mg/L): 6.9, Albumin (g/dL): 3.3, ISS: Stage III, High-risk cytogenetics: Present, LDH: Normal) - Signed by Cathie Hoops,  Janyth Contes, MD on 03/17/2022 Stage prefix: Initial diagnosis Beta 2 microglobulin range (mg/L): Greater than or equal to 5.5 Albumin range (g/dL): Less than 3.5 Cytogenetics: t(4;14) translocation, Other mutation, 1q addition   03/24/2022 - 07/19/2022 Chemotherapy   Patient is on Treatment Plan : MYELOMA NEWLY DIAGNOSED Daratumumab + Dexamethasone Weekly (DaraRd) q28d     09/21/2022 Imaging   MRI lumbar spine wo contrast 1. 6 mm nodule in the cauda equina at the L4-5 level. This likely represents a nerve sheath tumor such as a  schwannoma or neurofibroma. Recommend MRI of the lumbar spine with contrast for further evaluation. 2. 12 mm T2 hyperintense lesion along the inferior aspect of the S1 vertebral body. This may represent focal involvement of multiple myeloma. No other discrete lesions are present. No pathologic fracture is present. 3. Mild bilateral foraminal stenosis at L3-4 and L4-5. 4. Left paramedian annular tear at L5-S1 near the left S1 nerve roots.   12/13/2022 -  Chemotherapy   Patient is on Treatment Plan : PROSTATE Docetaxel (75) q21d     01/10/2023 - 01/10/2023 Chemotherapy   Patient is on Treatment Plan : PROSTATE Docetaxel (75) + Prednisone q21d     Urothelial carcinoma of distal ureter (HCC)  03/02/2022 Imaging   PET showed No definitive signs of multiple myeloma by FDG PET.   Obstruction of the RIGHT ureter with soft tissue mass in the RIGHT pelvis showing increased metabolic activity suspicious first and foremost for urothelial neoplasm given location adjacent to RIGHT UVJ and affect upon the RIGHT ureter.   Little or no excretion of FDG on the RIGHT but still with uptake of FDG by renal parenchyma. Degree of hydronephrosis is not substantially changed but lack of FDG excretion on the RIGHT is compatible with secondary sign of physiologic significance of ureteral obstruction.   RIGHT pelvic sidewall uptake without visible lesion, could represent tiny lymph node not visible on noncontrast imaging.   Aortic atherosclerosis.  Pulmonary emphysema. Aortic Atherosclerosis and Emphysema    03/17/2022 Initial Diagnosis   Urothelial carcinoma of distal ureter (HCC)  -03/14/2022, cystoscopy showed diffuse narrowing right distal ureter with hydroureter following up the distal ureter and extending proximally.  Right ureteroscopy showed nodular tumor distal ureter and a convincing area without tumor suspicious for extrinsic obstruction.s/p   right ureteral stent placement. Ureteral tumor showed a tiny  fragments of fibrous tissue, interpretation limited by thermal and crush artifact.  Right distal ureter saline barbotage showed hight grade urothelial carcinoma.    03/17/2022 Cancer Staging   Staging form: Renal Pelvis and Ureter, AJCC 8th Edition - Clinical: Stage Unknown (cTX, cN0, cM0) - Signed by Rickard Patience, MD on 03/17/2022 Stage prefix: Initial diagnosis WHO/ISUP grade (low/high): High Grade Histologic grading system: 2 grade system   06/05/2022 Imaging   PET scan at [facility] showed 1.Interval placement of right-sided double-J ureteral stent with resolution of hydronephrosis and improved excretion of radiotracer.  2.There is persistent abnormal hypermetabolic tissue extending posterior lateral from the right aspect of the bladder encasing the distal ureter/stent which measures approximately 5.1 x 4.2 (CT image 243).  3.There are similar linear areas of abnormal hypermetabolic tissue seen to extend more superiorly along the right posterior pelvic wall/peritoneum, and towards the sciatic notch (fused image 238). Linear configuration suggests possible perineural spread. Note that this soft tissue abuts and may encase pelvic vasculature including the external and internal iliac arteries.  4.NEW single subcentimeter but abnormal focus of FDG avidity in the right presacral region (CT image  231) measuring 1.6 cm representing new site of neoplastic disease   06/19/2022 - 08/10/2022 Radiation Therapy   Pelvis RT 06/19/22-08/21/22 Bladder RT 07/27/22- 08/20/22    09/18/2022 Imaging   PET scan at [facility] showed 1. Evidence of disease progression with multifocal new hypermetabolic lesions around the periphery of the liver as described above. The evaluation of these lesions is limited by significant misregistration between the PET and CT portions of the examination. Contrast-enhanced CT or MRI could be used for confirmation and/or guidance of biopsy as necessary. There is also a lung parenchymal nodule with new associated  FDG uptake, also suspicious for a site of metastasis.   2. Although the right nephroureteral stent appears to be well positioned, the right kidney demonstrates slightly increased hydronephrosis with significant surrounding fat stranding and relatively delayed clearance of FDG. These findings could suggest stent malfunction or be related to upper urinary tract infection. Suggest clinical correlation with any patient signs or symptoms such as right flank pain, fever, and/or urinalysis findings.     09/21/2022 Imaging   MRI lumbar spine wo contrast 1. 6 mm nodule in the cauda equina at the L4-5 level. 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 Recommend MRI of the lumbar spine with contrast for further evaluation. 2. 12 mm T2 hyperintense lesion along the inferior aspect of the S1 vertebral body. This may represent focal involvement of multiple myeloma. No other discrete lesions are present. No pathologic fracture is present. 3. Mild bilateral foraminal stenosis at L3-4 and L4-5. 4. Left paramedian annular tear at L5-S1 near the left S1 nerve roots.    Genetic Testing   Negative genetic testing. No pathogenic variants identified on the [facility] Multi-Cancer+RNA panel. The report date is 11/19/2022.  The Multi-Cancer + RNA Panel offered by [facility] includes sequencing and/or deletion/duplication analysis of the following 70 genes:  AIP*, ALK, APC*, ATM*, AXIN2*, BAP1*, BARD1*, BLM*, BMPR1A*, BRCA1*, BRCA2*, BRIP1*, CDC73*, CDH1*, CDK4, CDKN1B*, CDKN2A, CHEK2*, CTNNA1*, DICER1*, EPCAM, EGFR, FH*, FLCN*, GREM1, HOXB13, KIT, LZTR1, MAX*, MBD4, MEN1*, MET, MITF, MLH1*, MSH2*, MSH3*, MSH6*, MUTYH*, NF1*, NF2*, NTHL1*, PALB2*, PDGFRA, PMS2*, POLD1*, POLE*, POT1*, PRKAR1A*, PTCH1*, PTEN*, RAD51C*, RAD51D*, RB1*, RET, SDHA*, SDHAF2*, SDHB*, SDHC*, SDHD*, SMAD4*, SMARCA4*, SMARCB1*, SMARCE1*, STK11*, SUFU*, TMEM127*, TP53*, TSC1*, TSC2*, VHL*. RNA analysis is performed for *  genes.   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 Evidence of local prostate carcinoma within the pelvis with peritoneal implants along the bowel serosal surface as well as a metastatic LEFT iliac lymph node. 2. 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 3. Multifocal pleural metastasis in the RIGHT hemithorax. 4. No clear evidence of skeletal metastasis      Genetic Testing   Negative genetic testing. No pathogenic variants identified on the [facility] Multi-Cancer+RNA panel. The report date is 11/19/2022.  The Multi-Cancer + RNA Panel offered by [facility] includes sequencing and/or deletion/duplication analysis of the following 70 genes:  AIP*, ALK, APC*, ATM*, AXIN2*, BAP1*, BARD1*, BLM*, BMPR1A*, BRCA1*, BRCA2*, BRIP1*, CDC73*, CDH1*, CDK4, CDKN1B*, CDKN2A, CHEK2*, CTNNA1*, DICER1*, EPCAM, EGFR, FH*,  FLCN*, GREM1, HOXB13, KIT, LZTR1, MAX*, MBD4, MEN1*, MET, MITF, MLH1*, MSH2*, MSH3*, MSH6*, MUTYH*, NF1*, NF2*, NTHL1*, PALB2*, PDGFRA, PMS2*, POLD1*, POLE*, POT1*, PRKAR1A*, PTCH1*, PTEN*, RAD51C*, RAD51D*, RB1*, RET, SDHA*, SDHAF2*, SDHB*, SDHC*, SDHD*, SMAD4*, SMARCA4*, SMARCB1*, SMARCE1*, STK11*, SUFU*, TMEM127*, TP53*, TSC1*, TSC2*, VHL*. RNA analysis is performed for * genes.   12/13/2022 - 02/14/2023 Chemotherapy   Docetaxel (75) q21d  x4   02/09/2023 Imaging   PET scan at [facility] showed -Innumerable new hypermetabolic liver lesions, likely metastatic. In addition, there are new subpleural metastases and new right pleural effusion; new splenic lesion is also  present.  -Focal uptake in the left basal ganglia; brain MR could be obtained if clinically indicated.  -Further decrease of excretion in the right kidney.  -Pelvic mass continues to decrease, but there is newly avid soft tissue in the pelvis near the left external iliac vessels.    03/12/2023 Imaging   PMSA PET scan done at [facility] showed -Subpleural foci, mediastinal lymph nodes, hepatic foci, splenic foci and abdominal-pelvic lymph nodes with intense Pylarify uptake, overall higher in number and somewhat different distribution compared to FDG PET/CT dated 02/09/2023, as detailed in the body of the report.   -Possible obstruction of the right kidney. Moderate to severe right hydronephrosis.      03/14/2023 Procedure   S/p liver mass biopsy at [facility].  A: Liver, biopsy: -Adenocarcinoma, high-grade, compatible with prostatic origin.    03/16/2023 -  Chemotherapy   Started on Xtanti 160mg  daily  04/10/23 decrease to 120mg  daily due to severe fatigue     ALLERGIES:  is allergic to ciprofloxacin, gabapentin, prolia [denosumab], demerol [meperidine], and nsaids.  MEDICATIONS:  Current Outpatient Medications  Medication Sig Dispense Refill   Calcium Carbonate (CALCIUM 500 PO) Take 1 tablet by mouth daily.     Cholecalciferol (VITAMIN D3) 125 MCG (5000 UT) CAPS Take 5,000 Units by mouth daily.     ferrous sulfate 325 (65 FE) MG tablet Take 325 mg by mouth daily.     ipratropium (ATROVENT) 0.03 % nasal spray Place 1 spray into both nostrils in the morning.     losartan (COZAAR) 25 MG tablet Take 25 mg by mouth every morning.     melatonin 5 MG TABS Take 5 mg by mouth at bedtime.     Multiple Vitamins-Minerals (MENS 50+ MULTIVITAMIN) TABS Take 1 tablet by mouth daily.     senna (SENOKOT) 8.6 MG TABS tablet Take 2 tablets (17.2 mg total) by mouth daily. 120 tablet 0   enzalutamide (XTANDI) 40 MG tablet Take 3 tablets (120 mg total) by mouth daily. (Patient not taking: Reported on 04/19/2023) 120 tablet  0   HYDROcodone-acetaminophen (NORCO) 5-325 MG tablet Take 1 tablet by mouth every 6 (six) hours as needed for moderate pain. 30 tablet 0   Meth-Hyo-M Bl-Na Phos-Ph Sal (URIBEL) 118 MG CAPS Take 1 capsule (118 mg total) by mouth 2 (two) times daily. (Patient not taking: Reported on 04/19/2023) 60 capsule 1   nystatin cream (MYCOSTATIN) Apply 1 Application topically 2 (two) times daily. (Patient not taking: Reported on 04/19/2023) 30 g 0   tamsulosin (FLOMAX) 0.4 MG CAPS capsule Take 1 capsule by mouth daily after breakfast. (Patient not taking: Reported on 04/19/2023)     traZODone (DESYREL) 50 MG tablet Take 25 mg by mouth at bedtime.     trospium (SANCTURA) 20 MG tablet Take 1 tablet (20 mg total) by mouth 2 (two) times daily. 60 tablet 0   No current facility-administered  medications for this visit.    VITAL SIGNS: There were no vitals taken for this visit. There were no vitals filed for this visit.  Estimated body mass index is 22.32 kg/m as calculated from the following:   Height as of 03/20/23: 5\' 7"  (1.702 m).   Weight as of 04/10/23: 142 lb 8 oz (64.6 kg).  LABS: CBC:    Component Value Date/Time   WBC 6.2 04/19/2023 0855   WBC 12.7 (H) 02/14/2023 0833   HGB 12.0 (L) 04/19/2023 0855   HGB 14.4 03/25/2015 0531   HCT 36.4 (L) 04/19/2023 0855   HCT 44.3 03/25/2015 0531   PLT 312 04/19/2023 0855   PLT 142 (L) 03/25/2015 0531   MCV 98.1 04/19/2023 0855   MCV 88 03/25/2015 0531   NEUTROABS 4.7 04/19/2023 0855   NEUTROABS 6.3 03/25/2015 0531   LYMPHSABS 0.5 (L) 04/19/2023 0855   LYMPHSABS 0.8 (L) 03/25/2015 0531   MONOABS 0.6 04/19/2023 0855   MONOABS 0.8 03/25/2015 0531   EOSABS 0.2 04/19/2023 0855   EOSABS 0.4 03/25/2015 0531   BASOSABS 0.0 04/19/2023 0855   BASOSABS 0.0 03/25/2015 0531   Comprehensive Metabolic Panel:    Component Value Date/Time   NA 135 04/19/2023 0854   NA 139 03/24/2015 0645   K 4.3 04/19/2023 0854   K 4.4 03/24/2015 0645   CL 101 04/19/2023 0854    CL 108 03/24/2015 0645   CO2 23 04/19/2023 0854   CO2 24 03/24/2015 0645   BUN 45 (H) 04/19/2023 0854   BUN 20 03/24/2015 0645   CREATININE 2.38 (H) 04/19/2023 0854   CREATININE 1.24 03/24/2015 0645   GLUCOSE 120 (H) 04/19/2023 0854   GLUCOSE 113 (H) 03/24/2015 0645   CALCIUM 9.0 04/19/2023 0854   CALCIUM 8.2 (L) 03/24/2015 0645   AST 41 04/19/2023 0854   ALT 18 04/19/2023 0854   ALT 21 03/22/2015 1108   ALKPHOS 150 (H) 04/19/2023 0854   ALKPHOS 70 03/22/2015 1108   BILITOT 0.5 04/19/2023 0854   PROT 5.6 (L) 04/19/2023 0854   PROT 6.9 03/22/2015 1108   ALBUMIN 3.0 (L) 04/19/2023 0854   ALBUMIN 3.9 03/22/2015 1108    RADIOGRAPHIC STUDIES: CT Abdomen Pelvis Wo Contrast  Result Date: 04/11/2023 CLINICAL DATA:  Metastatic prostate cancer, worsening abdominal distension for 1-2 weeks. History multiple myeloma and urothelial carcinoma the distal ureter. * Tracking Code: BO * EXAM: CT ABDOMEN AND PELVIS WITHOUT CONTRAST TECHNIQUE: Multidetector CT imaging of the abdomen and pelvis was performed following the standard protocol without IV contrast. RADIATION DOSE REDUCTION: This exam was performed according to the departmental dose-optimization program which includes automated exposure control, adjustment of the mA and/or kV according to patient size and/or use of iterative reconstruction technique. COMPARISON:  PET 12/23/2022 and CT abdomen pelvis 02/07/2019. FINDINGS: Lower chest: Basilar pulmonary parenchymal cystic changes. Moderate right pleural effusion with compressive atelectasis in the right lower lobe. Known enlarging pleural nodules and masses in the posterior right hemithorax measure up to 2.2 x 4.6 cm. Heart is at the upper limits of normal in size. No pericardial effusion. Distal esophagus is grossly unremarkable. Hepatobiliary: Heterogeneous masses are seen throughout the liver and have enlarged significantly from 12/05/2022, measuring up to 6.3 x 6.8 cm. Gallbladder is grossly  unremarkable. No biliary ductal dilatation. Pancreas: Negative. Spleen: Negative. Adrenals/Urinary Tract: Adrenal glands are unremarkable. Moderate right hydronephrosis with a double-J right ureteral stent in place with the proximal loop in the right intrarenal collecting system and distal loop in  the bladder. Hyperattenuating and hypoattenuating lesions in the left kidney, difficult to further characterize without post-contrast imaging. Left ureter is decompressed. Bladder is low in volume. Stomach/Bowel: Stomach, small bowel, appendix and colon are unremarkable. Vascular/Lymphatic: Periportal lymph nodes measure up to 2.0 cm. Right juxtadiaphragmatic lymph nodes measure up to 2.3 cm. Abdominal and retroperitoneal lymph nodes measure up to 1.4 cm along the left external iliac chain (2/59), new. Reproductive: Prostate is atrophic or absent. Other: Mild presacral edema.  Small scattered ascites. Musculoskeletal: A lipoma is seen in the posterior right thigh. Screws in the right proximal femur. Osteopenia. Degenerative changes in the spine. Bilateral L5 pars defects without alignment abnormality. Old right rib fracture. No worrisome lytic or sclerotic lesions. IMPRESSION: 1. Small scattered ascites. No additional findings to explain the patient's abdominal distension. 2. Progressive pleural, hepatic and nodal metastatic disease. 3. Moderate right hydronephrosis with double-J ureteral stent in place. 4. Moderate right pleural effusion with compressive atelectasis in the right lower lobe. Electronically Signed   By: Leanna Battles M.D.   On: 04/11/2023 16:14   US THORACENTESIS ASP PLEURAL SPACE W/IMG GUIDE  Result Date: 04/11/2023 INDICATION: History of multiple myeloma and high-grade urothelial carcinoma, metastatic prostate cancer. Imaging found patient to have large right pleural effusion. Patient was referred for diagnostic and therapeutic right thoracentesis. EXAM: ULTRASOUND GUIDED DIAGNOSTIC AND THERAPEUTIC  RIGHT THORACENTESIS MEDICATIONS: 15 mL 1 % lidocaine, topical lidocaine spray COMPLICATIONS: None immediate. PROCEDURE: An ultrasound guided thoracentesis was thoroughly discussed with the patient and questions answered. The benefits, risks, alternatives and complications were also discussed. The patient understands and wishes to proceed with the procedure. Written consent was obtained. Ultrasound was performed to localize and mark an adequate pocket of fluid in the right chest. The area was then prepped and draped in the normal sterile fashion. 1% Lidocaine was used for local anesthesia. Under ultrasound guidance a 6 Fr Safe-T-Centesis catheter was introduced. Thoracentesis was performed. The catheter was removed and a dressing applied. FINDINGS: A total of approximately 2 L of clear, amber fluid was removed. Samples were sent to the laboratory as requested by the clinical team. IMPRESSION: Successful ultrasound guided right thoracentesis yielding 2 L of pleural fluid. Read by: Alex Gardener, AGNP-BC Electronically Signed   By: Olive Bass M.D.   On: 04/11/2023 09:50   DG Chest Port 1 View  Result Date: 04/11/2023 CLINICAL DATA:  Follow-up thoracentesis EXAM: PORTABLE CHEST 1 VIEW COMPARISON:  04/09/2023 FINDINGS: Left chest remains clear. Marked reduction in the amount of pleural fluid on the right, although a small amount does persist. No pneumothorax. Improved aeration of the right lower lung. IMPRESSION: Marked reduction in the amount of pleural fluid on the right following thoracentesis. No pneumothorax. Improved aeration of the right lower lung. Electronically Signed   By: Paulina Fusi M.D.   On: 04/11/2023 09:44   DG Abd 2 Views  Result Date: 04/10/2023 CLINICAL DATA:  Abdominal distension. History of metastatic prostate cancer. EXAM: ABDOMEN - 2 VIEW COMPARISON:  December 13, 2022. FINDINGS: The bowel gas pattern is normal. There is no evidence of free air. Right-sided ureteral stent is noted in  grossly good position. Phleboliths are noted in the pelvis. IMPRESSION: No abnormal bowel dilatation. Electronically Signed   By: Lupita Raider M.D.   On: 04/10/2023 12:00   DG Chest 2 View  Result Date: 04/09/2023 CLINICAL DATA:  Shortness of breath, congestion. EXAM: CHEST - 2 VIEW COMPARISON:  November 28, 2022.  December 05, 2022. FINDINGS:  The heart size and mediastinal contours are within normal limits. Large pleural based mass is noted anteriorly in right upper lobe. Moderate right pleural effusion is noted with associated right basilar atelectasis. Left lung is grossly unremarkable. The visualized skeletal structures are unremarkable. IMPRESSION: Moderate right pleural effusion is noted with associated right basilar atelectasis. Pleural based mass is noted anteriorly in right upper lobe consistent with metastatic disease as described on prior PET scan. Electronically Signed   By: Lupita Raider M.D.   On: 04/09/2023 16:29    PERFORMANCE STATUS (ECOG) : 2 - Symptomatic, <50% confined to bed  Review of Systems Unless otherwise noted, a complete review of systems is negative.  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  Reportedly, [facility] has told him that he is not a candidate for Pluvicto.  Patient and wife would like to initiate hospice.  Patient confirms DNR/DNI.  DNR order signed today.  Shortness of breath -recent thoracentesis with 2 L removed last week.  Will send for chest x-ray and consider repeat thoracentesis if needed.  Abdominal distention - He had CT of the abdomen and pelvis on 04/11/2023 which showed small scattered ascites but no additional  findings to explain abdominal distention.  However, there was progressive pleural, hepatic, and nodal metastatic disease.  Moderate right hydronephrosis with ureteral stent in place.  Moderate right pleural effusion.  Suspect that abdominal distention likely reflects tumor burden underappreciated on CT.  However, will send for KUB and abdominal ultrasound to rule out obstructive gas pattern and ascites.    Neoplasm related pain  -refill Norco.  Start low-dose dexamethasone.  Case and plan discussed with Dr. Cathie Hoops.  Patient would like to follow-up with Dr. Cathie Hoops next week but does not wish to delay initiation of hospice.  Patient expressed understanding and was in agreement with this plan. He also understands that He can call clinic at any time with any questions, concerns, or complaints.   Thank you for allowing me to participate in the care of this very pleasant patient.   Time Total: 25 minutes  Visit consisted of counseling and education dealing with the complex and emotionally intense issues of symptom management in the setting of serious illness.Greater than 50%  of this time was spent counseling and coordinating care related to the above assessment and plan.  Signed by: Laurette Schimke, PhD, NP-C

## 2023-04-19 NOTE — Telephone Encounter (Signed)
I personally reached out to patient to discuss his xray results from today. Patient had over 2 Liters of fluid drawn today via a thoracentesis and was in route going home. No acute bowel obstruction identified on abd xray/u/s. Small amt of ascites present.  Pt has f/u apts with Dr. Cathie Hoops next week. He will also be contacted by hospice -[facility].  Pt understands that he can call the clinic sooner if needed if he experiences worsening shortness prior to his next apts. He thanked me for assisting him and updating him on his care.

## 2023-04-19 NOTE — Procedures (Signed)
PROCEDURE SUMMARY:  Successful image-guided right thoracentesis. Yielded 2.4 L of amber fluid. Pt tolerated procedure well. No immediate complications. EBL = trace   Specimen was not sent for labs. CXR ordered.  Please see imaging section of Epic for full dictation.  Kennieth Francois PA-C 04/19/2023 3:37 PM

## 2023-04-20 ENCOUNTER — Encounter: Payer: Self-pay | Admitting: Urology

## 2023-04-20 ENCOUNTER — Ambulatory Visit (INDEPENDENT_AMBULATORY_CARE_PROVIDER_SITE_OTHER): Payer: Medicare Other | Admitting: Urology

## 2023-04-20 VITALS — BP 119/68 | HR 76 | Ht 67.0 in | Wt 138.0 lb

## 2023-04-20 DIAGNOSIS — C775 Secondary and unspecified malignant neoplasm of intrapelvic lymph nodes: Secondary | ICD-10-CM | POA: Diagnosis not present

## 2023-04-20 DIAGNOSIS — N1330 Unspecified hydronephrosis: Secondary | ICD-10-CM

## 2023-04-20 DIAGNOSIS — C662 Malignant neoplasm of left ureter: Secondary | ICD-10-CM

## 2023-04-20 DIAGNOSIS — C61 Malignant neoplasm of prostate: Secondary | ICD-10-CM

## 2023-04-20 DIAGNOSIS — C785 Secondary malignant neoplasm of large intestine and rectum: Secondary | ICD-10-CM

## 2023-04-20 DIAGNOSIS — C782 Secondary malignant neoplasm of pleura: Secondary | ICD-10-CM

## 2023-04-20 DIAGNOSIS — C787 Secondary malignant neoplasm of liver and intrahepatic bile duct: Secondary | ICD-10-CM

## 2023-04-20 DIAGNOSIS — C678 Malignant neoplasm of overlapping sites of bladder: Secondary | ICD-10-CM

## 2023-04-20 DIAGNOSIS — C786 Secondary malignant neoplasm of retroperitoneum and peritoneum: Secondary | ICD-10-CM

## 2023-04-20 MED ORDER — GEMTESA 75 MG PO TABS: 75.0000 mg | ORAL_TABLET | Freq: Every day | ORAL | 0 refills | Status: DC

## 2023-04-20 NOTE — Progress Notes (Signed)
I, [name] [name],acting as a scribe for [name], MD.,have documented all relevant documentation on the behalf of [name], MD,as directed by  [name], MD while in the presence of [name], MD.  04/20/2023 9:54 AM   [name] [name] November 13, 1941 [id number]  Referring provider: [name], MD [address] [place],  [place] [address]  Chief Complaint  Patient presents with   Follow-up   Urologic history: 1. Urothelial carcinoma of distal ureter/bladder Unresectable; s/p palliative radiation   2. 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  Urethelial carcinoma felt unresectable at [facility] and he underwent palliative radiation.  He was having significant pelvic pain/LUTS after stent exchange at [facility] in November 2023.  Status post ureteral stent exchange 03/20/23 and states all of his stent pain/symptoms have resolved.  He has been able to discontinue Uribel, Tamsulosin, and Trospium.  Only complaint is urinary frequency. He states all of his cancer treatments have been discontinued and he is currently under palliative care/symptom management.   PMH: Past Medical History:  Diagnosis Date   Age related osteoporosis    Anemia    Barrett's esophagus    Bradycardia    Cataract    CKD (chronic kidney disease) stage 3, GFR 30-59 ml/min (HCC)    Colon polyps    DDD (degenerative disc disease), cervical    DDD (degenerative disc disease), lumbar    Femur  fracture (HCC)    Right   Fundic gland polyps of stomach, benign    Gastritis    GERD (gastroesophageal reflux disease)    Hematuria    Hemorrhage of rectum and anus    History of colon polyps    Hyperkalemia    Hypertension    Insomnia    Lesion of liver    going for bx at [facility] 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)    RLS (restless legs syndrome)    Skin cancer    Urothelial carcinoma of distal ureter (HCC)     Surgical History: Past Surgical History:  Procedure Laterality Date   BAND HEMORRHOIDECTOMY     COLONOSCOPY     COLONOSCOPY WITH PROPOFOL N/A 05/06/2018   Procedure: COLONOSCOPY WITH PROPOFOL;  Surgeon: Christena Deem, MD;  Location: [facility] ENDOSCOPY;  Service: Endoscopy;  Laterality: N/A;   COLONOSCOPY WITH PROPOFOL N/A 09/23/2018   Procedure: COLONOSCOPY WITH PROPOFOL;  Surgeon: Christena Deem, MD;  Location: [facility] ENDOSCOPY;  Service: Endoscopy;  Laterality: N/A;   COLONOSCOPY WITH PROPOFOL N/A 03/30/2021   Procedure: COLONOSCOPY WITH PROPOFOL;  Surgeon: Toledo, Boykin Nearing, MD;  Location: [facility] ENDOSCOPY;  Service: Gastroenterology;  Laterality: N/A;   CYSTOSCOPY W/ URETERAL STENT PLACEMENT Right 03/14/2022   Procedure: CYSTOSCOPY WITH RETROGRADE PYELOGRAM/URETERAL STENT PLACEMENT;  Surgeon: [name], MD;  Location: [facility] ORS;  Service: Urology;  Laterality: Right;  CYSTOSCOPY W/ URETERAL STENT PLACEMENT Right 03/20/2023   Procedure: CYSTOSCOPY WITH STENT EXCHANGE;  Surgeon: [name], MD;  Location: [facility] ORS;  Service: Urology;  Laterality: Right;   ESOPHAGOGASTRODUODENOSCOPY (EGD) WITH PROPOFOL N/A 10/03/2016   Procedure: ESOPHAGOGASTRODUODENOSCOPY (EGD) WITH PROPOFOL;  Surgeon: Christena Deem, MD;  Location: [facility] ENDOSCOPY;  Service: Endoscopy;  Laterality: N/A;   EYE SURGERY     CATARACTS   EYELID  SURGERY     FLEXIBLE SIGMOIDOSCOPY     FRACTURE SURGERY Right 2016   femur   FRACTURE SURGERY     ORIF DISTAL FEMUR FRACTURE     TONSILLECTOMY     TRANSURETHRAL RESECTION OF BLADDER TUMOR N/A 03/14/2022   Procedure: TRANSURETHRAL RESECTION OF BLADDER TUMOR (TURBT);  Surgeon: [name], MD;  Location: [facility] ORS;  Service: Urology;  Laterality: N/A;    Home Medications:  Allergies as of 04/20/2023       Reactions   Ciprofloxacin Other (See Comments)   Yeast infection   Gabapentin Hives   Prolia [denosumab] Other (See Comments)   Pain and vision loss. Pt requesting to add as an allergy.    Demerol [meperidine] Nausea And Vomiting, Other (See Comments)   Patient passed out.   Nsaids Other (See Comments)   Not supposed to take NSAIDS per Dr [name] due to [facility] Esophagus history Per Nephrologist, not to take NSAIDS        Medication List        Accurate as of Apr 20, 2023  9:54 AM. If you have any questions, ask your nurse or doctor.          acetaminophen 650 MG CR tablet Commonly known as: TYLENOL Take 650 mg by mouth every 8 (eight) hours as needed for pain.   CALCIUM 500 PO Take 1 tablet by mouth daily.   dexamethasone 1 MG tablet Commonly known as: DECADRON Take 1 tablet (1 mg total) by mouth 2 (two) times daily with a meal.   ferrous sulfate 325 (65 FE) MG tablet Take 325 mg by mouth daily.   Gemtesa 75 MG Tabs Generic drug: Vibegron Take 1 tablet (75 mg total) by mouth daily. Started by: [name], MD   ipratropium 0.03 % nasal spray Commonly known as: ATROVENT Place 1 spray into both nostrils in the morning.   losartan 25 MG tablet Commonly known as: COZAAR Take 25 mg by mouth every morning.   melatonin 5 MG Tabs Take 5 mg by mouth at bedtime.   Mens 50+ Multivitamin Tabs Take 1 tablet by mouth daily.   nystatin cream Commonly known as: MYCOSTATIN Apply 1 Application topically 2 (two) times daily.   senna 8.6 MG Tabs  tablet Commonly known as: SENOKOT Take 2 tablets (17.2 mg total) by mouth daily.   tamsulosin 0.4 MG Caps capsule Commonly known as: FLOMAX Take 1 capsule by mouth daily after breakfast.   traZODone 50 MG tablet Commonly known as: DESYREL Take 25 mg by mouth at bedtime.   Uribel 118 MG Caps Take 1 capsule (118 mg total) by mouth 2 (two) times daily.   Vitamin D3 125 MCG (5000 UT) Caps Take 5,000 Units by mouth daily.        Allergies:  Allergies  Allergen Reactions   Ciprofloxacin Other (See Comments)    Yeast infection   Gabapentin Hives   Prolia [Denosumab] Other (See Comments)    Pain and vision loss. Pt requesting to add as an allergy.  Demerol [Meperidine] Nausea And Vomiting and Other (See Comments)    Patient passed out.   Nsaids Other (See Comments)    Not supposed to take NSAIDS per Dr [name] due to Barrett's Esophagus history Per Nephrologist, not to take NSAIDS    Family History: Family History  Problem Relation Age of Onset   Breast cancer Mother        dx 24s-50s   Prostate cancer Paternal Grandfather 30   Breast cancer Cousin     Social History:  reports that he quit smoking about 48 years ago. His smoking use included cigarettes. He has a 15.00 pack-year smoking history. He has been exposed to tobacco smoke. He has never used smokeless tobacco. He reports that he does not currently use alcohol after a past usage of about 1.0 standard drink of alcohol per week. He reports that he does not use drugs.   Physical Exam: BP 119/68   Pulse 76   Ht 5\' 7"  (1.702 m)   Wt 138 lb (62.6 kg)   BMI 21.61 kg/m   Constitutional:  Alert and oriented, No acute distress. HEENT: Mabel AT Respiratory: Normal respiratory effort, no increased work of breathing. GI: Abdomen is soft, nontender, nondistended, no abdominal masses Psychiatric: Normal mood and affect.   Assessment & Plan:    1. Metastatic prostate cancer/urothelial carcinoma Currently followed by  medical oncology and treatments have been discontinued. 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 Follow-up 6 months with KUB Trial Gemtesa 75 mg daily for urinary frequency- samples given He will call back regarding efficacy.  I have reviewed the above documentation for accuracy and completeness, and I agree with the above.   [name], MD  [facility] [address], [address] [place], [place] [address] [phone]

## 2023-04-21 ENCOUNTER — Other Ambulatory Visit: Payer: Self-pay

## 2023-04-23 ENCOUNTER — Other Ambulatory Visit: Payer: Self-pay | Admitting: *Deleted

## 2023-04-23 ENCOUNTER — Telehealth: Payer: Self-pay | Admitting: *Deleted

## 2023-04-23 DIAGNOSIS — J90 Pleural effusion, not elsewhere classified: Secondary | ICD-10-CM

## 2023-04-23 DIAGNOSIS — R0602 Shortness of breath: Secondary | ICD-10-CM

## 2023-04-23 NOTE — Telephone Encounter (Signed)
Patient called asking t be set upfor a Thoracentesis on Wednesday of this week. Please call patient back regarding this request

## 2023-04-23 NOTE — Telephone Encounter (Signed)
U/S Thoracentesis 8:30a an arrive at 8a. RN contacted pt. Pt informed of apt times. Hospice will enroll pt to services on Friday per patient. 

## 2023-04-23 NOTE — Telephone Encounter (Signed)
U/S Thoracentesis 8:30a an arrive at 8a. RN contacted pt. Pt informed of apt times. Hospice will enroll pt to services on Friday per patient.

## 2023-04-25 ENCOUNTER — Other Ambulatory Visit: Payer: Self-pay

## 2023-04-25 ENCOUNTER — Other Ambulatory Visit: Payer: Self-pay | Admitting: Internal Medicine

## 2023-04-25 ENCOUNTER — Ambulatory Visit
Admission: RE | Admit: 2023-04-25 | Discharge: 2023-04-25 | Disposition: A | Discharge status: Home / Self Care | Payer: Medicare Other | Source: Ambulatory Visit | Attending: Hospice and Palliative Medicine | Admitting: Hospice and Palliative Medicine

## 2023-04-25 ENCOUNTER — Ambulatory Visit
Admission: RE | Admit: 2023-04-25 | Discharge: 2023-04-25 | Disposition: A | Discharge status: Home / Self Care | Payer: Medicare Other | Source: Ambulatory Visit | Attending: Internal Medicine | Admitting: Internal Medicine

## 2023-04-25 DIAGNOSIS — R0602 Shortness of breath: Secondary | ICD-10-CM | POA: Diagnosis not present

## 2023-04-25 DIAGNOSIS — Z9889 Other specified postprocedural states: Secondary | ICD-10-CM

## 2023-04-25 DIAGNOSIS — J90 Pleural effusion, not elsewhere classified: Secondary | ICD-10-CM

## 2023-04-25 DIAGNOSIS — C61 Malignant neoplasm of prostate: Secondary | ICD-10-CM

## 2023-04-25 MED ORDER — LIDOCAINE HCL (PF) 1 % IJ SOLN
10.0000 mL | Freq: Once | INTRAMUSCULAR | Status: AC
  Administered 2023-04-25: 10 mL via INTRADERMAL

## 2023-04-25 NOTE — Procedures (Signed)
PROCEDURE SUMMARY:  Successful US guided therapeutic right thoracentesis. Yielded 2.4 L of hazy, dark amber fluid. Pt tolerated procedure well. No immediate complications.  Specimen not sent for labs. CXR ordered.  EBL < 1 mL  Shon Hough, AGNP 04/25/2023 9:14 AM

## 2023-04-26 ENCOUNTER — Telehealth: Payer: Self-pay

## 2023-04-26 ENCOUNTER — Inpatient Hospital Stay (HOSPITAL_BASED_OUTPATIENT_CLINIC_OR_DEPARTMENT_OTHER): Payer: Medicare Other | Admitting: Oncology

## 2023-04-26 ENCOUNTER — Inpatient Hospital Stay: Payer: Medicare Other

## 2023-04-26 ENCOUNTER — Encounter: Payer: Self-pay | Admitting: Oncology

## 2023-04-26 VITALS — BP 123/50 | HR 68 | Temp 97.3°F | Resp 18 | Wt 136.1 lb

## 2023-04-26 DIAGNOSIS — C669 Malignant neoplasm of unspecified ureter: Secondary | ICD-10-CM | POA: Diagnosis not present

## 2023-04-26 DIAGNOSIS — C787 Secondary malignant neoplasm of liver and intrahepatic bile duct: Secondary | ICD-10-CM

## 2023-04-26 DIAGNOSIS — J90 Pleural effusion, not elsewhere classified: Secondary | ICD-10-CM

## 2023-04-26 DIAGNOSIS — N184 Chronic kidney disease, stage 4 (severe): Secondary | ICD-10-CM

## 2023-04-26 DIAGNOSIS — C9000 Multiple myeloma not having achieved remission: Secondary | ICD-10-CM

## 2023-04-26 DIAGNOSIS — C61 Malignant neoplasm of prostate: Secondary | ICD-10-CM | POA: Diagnosis not present

## 2023-04-26 DIAGNOSIS — G893 Neoplasm related pain (acute) (chronic): Secondary | ICD-10-CM

## 2023-04-26 LAB — CBC WITH DIFFERENTIAL (CANCER CENTER ONLY)
Abs Immature Granulocytes: 0.03 10*3/uL (ref 0.00–0.07)
Basophils Absolute: 0 10*3/uL (ref 0.0–0.1)
Basophils Relative: 0 %
Eosinophils Absolute: 0.1 10*3/uL (ref 0.0–0.5)
Eosinophils Relative: 1 %
HCT: 38.6 % — ABNORMAL LOW (ref 39.0–52.0)
Hemoglobin: 12.7 g/dL — ABNORMAL LOW (ref 13.0–17.0)
Immature Granulocytes: 0 %
Lymphocytes Relative: 5 %
Lymphs Abs: 0.5 10*3/uL — ABNORMAL LOW (ref 0.7–4.0)
MCH: 32.1 pg (ref 26.0–34.0)
MCHC: 32.9 g/dL (ref 30.0–36.0)
MCV: 97.5 fL (ref 80.0–100.0)
Monocytes Absolute: 0.6 10*3/uL (ref 0.1–1.0)
Monocytes Relative: 7 %
Neutro Abs: 7.1 10*3/uL (ref 1.7–7.7)
Neutrophils Relative %: 87 %
Platelet Count: 340 10*3/uL (ref 150–400)
RBC: 3.96 MIL/uL — ABNORMAL LOW (ref 4.22–5.81)
RDW: 12.5 % (ref 11.5–15.5)
WBC Count: 8.3 10*3/uL (ref 4.0–10.5)
nRBC: 0 % (ref 0.0–0.2)

## 2023-04-26 LAB — CMP (CANCER CENTER ONLY)
ALT: 16 U/L (ref 0–44)
AST: 30 U/L (ref 15–41)
Albumin: 2.9 g/dL — ABNORMAL LOW (ref 3.5–5.0)
Alkaline Phosphatase: 136 U/L — ABNORMAL HIGH (ref 38–126)
Anion gap: 10 (ref 5–15)
BUN: 58 mg/dL — ABNORMAL HIGH (ref 8–23)
CO2: 27 mmol/L (ref 22–32)
Calcium: 9.2 mg/dL (ref 8.9–10.3)
Chloride: 101 mmol/L (ref 98–111)
Creatinine: 2.5 mg/dL — ABNORMAL HIGH (ref 0.61–1.24)
GFR, Estimated: 25 mL/min — ABNORMAL LOW (ref >60)
Glucose, Bld: 117 mg/dL — ABNORMAL HIGH (ref 70–99)
Potassium: 4.6 mmol/L (ref 3.5–5.1)
Sodium: 138 mmol/L (ref 135–145)
Total Bilirubin: 0.4 mg/dL (ref 0.3–1.2)
Total Protein: 5.9 g/dL — ABNORMAL LOW (ref 6.5–8.1)

## 2023-04-26 NOTE — Telephone Encounter (Signed)
Request for PleurX cath placement faxed to IR.

## 2023-04-26 NOTE — Assessment & Plan Note (Addendum)
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

## 2023-04-26 NOTE — Assessment & Plan Note (Addendum)
High-grade urothelial carcinoma, Cystoscopy at [facility] showed ureter high-grade carcinoma as well as bladder high-grade papillary carcinoma.S/p palliative radiation.  10/31/2022 repeat cystoscopy did not reveal any recurrence. - He has ureter stent, April 2024 s/p repeat  cystoscopy.Levan Hurst Trials per urology

## 2023-04-26 NOTE — Assessment & Plan Note (Signed)
PRN Norco.  Start low-dose dexamethasone

## 2023-04-26 NOTE — Progress Notes (Signed)
Hematology/Oncology Progress note [phone] Fax:([phone]    CHIEF COMPLAINTS/REASON FOR VISIT:  Multiple myeloma, high-grade urothelial carcinoma.metastatic prostate cancer.   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 [facility]) Staging form: Prostate, AJCC 8th Edition - Clinical: Stage IVB (cTX, cNX, pM1, PSA: 7, Grade Group: 3) - Signed by Rickard Patience, MD on 10/12/2022  Urothelial carcinoma of distal ureter (HCC) Staging form: Renal Pelvis and Ureter, AJCC 8th Edition - Clinical: Stage Unknown (cTX, cN0, cM0) - Signed by Rickard Patience, MD on 03/17/2022   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  Patient has received 3 cycles of docetaxel.  PSA has not responded well, this is a possible refractory case Reviewed PET PMSA results, liver mass biopsy were reviewed with patient, he has developed a new liver lesions, pleural lesions and splenic lesions, pathology showed prostate cancer disease progression.  Xtandi stopped due to poor tolerance.  Not qualified for Pluvicto at [facility] due to poor kidney function. Patient has decided not to proceed with any additional chemotherapy, transition  to comfort care/hospice.  He has discussed with palliative care service and will meet hospice nurse tomorrow.    Multiple myeloma (HCC) #Stage III IgA kappa multiple myeloma, myeloma FISH panel positive for 13q deletion, gain of 1q, t (4;14), high risk, not candidate for autologous bone marrow transplant. 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   Urothelial carcinoma of distal ureter (HCC) High-grade urothelial carcinoma, Cystoscopy at [facility] showed ureter high-grade carcinoma as well as bladder high-grade papillary carcinoma.S/p palliative radiation.  10/31/2022 repeat cystoscopy did not reveal any recurrence. - He has ureter stent, April 2024 s/p repeat  cystoscopy.Levan Hurst Trials per urology   Stage 4 chronic kidney disease (HCC) Encourage oral hydration and avoid nephrotoxins.    Pleural effusion Recommend placement of Pleurx catheter.  Neoplasm related pain PRN Norco.  Start low-dose dexamethasone     All questions were answered. The patient knows to call the clinic with any problems, questions or concerns.  Rickard Patience, MD, PhD [facility] Hematology Oncology 04/26/2023      HISTORY OF PRESENTING ILLNESS:  [name] [name] is a 82 y.o. male presents for follow-up of multiple myeloma and high-grade urothelial carcinoma, and metastatic castration resistant prostate cancer.   Oncology History  Multiple myeloma (HCC)  03/02/2022 Imaging   PET showed No definitive signs of multiple myeloma by FDG PET.   Obstruction of the RIGHT ureter with soft tissue mass in the RIGHT pelvis showing increased metabolic activity suspicious first and foremost for urothelial neoplasm given location adjacent to RIGHT UVJ and affect upon the RIGHT ureter.   Little or no excretion of FDG on the RIGHT but still  with uptake of FDG by  renal parenchyma. Degree of hydronephrosis is not substantially changed but lack of FDG excretion on the RIGHT is compatible with secondary sign of physiologic significance of ureteral obstruction.   RIGHT pelvic sidewall uptake without visible lesion, could represent tiny lymph node not visible on noncontrast imaging.   Aortic atherosclerosis.  Pulmonary emphysema. Aortic Atherosclerosis and Emphysema    03/17/2022 Initial Diagnosis   Multiple myeloma (HCC) -Patient has progressively worsening kidney function.  He is scheduled for kidney biopsy. 01/26/2022, free kappa light chain 1058, lambda 15.3, light chain ratio 69. Protein electrophoresis showed restricted band-M spike migrating in the beta-1 globulin region. ANA negative.  ANCA negative. Random urine protein electrophoresis showed abnormal protein band detected in the gamma globulin And a second possible abnormal protein band that may represent monoclonal protein.   02/23/2022 multiple myeloma showed IgA 2883, M protein of 1.8, beta 2 microglobulin 6.9, kappa free light chain 1268.9, lambda 13.2, free light chain ratio 96.13.   CMP showed creatinine of 3.41, that is significantly worse than his baseline in June 2022. 02/28/2022, 24-hour urine protein electrophoresis showed M protein of 729 mg, IgA monoclonal kappa light chain. 03/01/2022, [facility] kidney biopsy showed diffuse light chain tubulopathy, and light chain cast nephropathy, moderate interstitial fibrosis,4% global glomerular sclerosis and the marked atherosclerosis.  03/06/2022, patient underwent a bone marrow biopsy. Pathology showed hypercellular bone marrow with plasma cell neoplasm, representing 20% of all cells in the aspirate associated with prominent interstitial infiltrates and numerous predominantly small clusters in the clots in the biopsy sections.  The plasma cells display kappa light chain restriction consistent with plasma cell neoplasm. Normal cytogenetics, myeloma FISH  panel positive for 13q deletion, gain of 1q, t (4;14)   03/17/2022 Cancer Staging   Staging form: Plasma Cell Myeloma and Plasma Cell Disorders, AJCC 8th Edition - Clinical stage from 03/17/2022: RISS Stage III (Beta-2-microglobulin (mg/L): 6.9, Albumin (g/dL): 3.3, ISS: Stage III, High-risk cytogenetics: Present, LDH: Normal) - Signed by Rickard Patience, MD on 03/17/2022 Stage prefix: Initial diagnosis Beta 2 microglobulin range (mg/L): Greater than or equal to 5.5 Albumin range (g/dL): Less than 3.5 Cytogenetics: t(4;14) translocation, Other mutation, 1q addition   03/24/2022 - 07/19/2022 Chemotherapy   Patient is on Treatment Plan : MYELOMA NEWLY DIAGNOSED Daratumumab + Dexamethasone Weekly (DaraRd) q28d     09/21/2022 Imaging   MRI lumbar spine wo contrast 1. 6 mm nodule in the cauda equina at the L4-5 level. 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 Recommend MRI of the lumbar spine with contrast for further evaluation. 2. 12 mm T2 hyperintense lesion along the inferior aspect of the S1 vertebral body. This may represent focal involvement of multiple myeloma. No other discrete lesions are present. No pathologic fracture is present. 3. Mild bilateral foraminal stenosis at L3-4 and L4-5. 4. Left paramedian annular tear at L5-S1 near the left S1 nerve roots.   12/13/2022 -  Chemotherapy   Patient is on Treatment Plan : PROSTATE Docetaxel (75) q21d     01/10/2023 - 01/10/2023 Chemotherapy   Patient is on Treatment Plan : PROSTATE Docetaxel (75) + Prednisone q21d     Urothelial carcinoma of distal ureter (HCC)  03/02/2022 Imaging   PET showed No definitive signs of multiple myeloma by FDG PET.   Obstruction of the RIGHT ureter with soft tissue mass in the RIGHT pelvis showing increased metabolic activity suspicious first and foremost for urothelial neoplasm given location adjacent to RIGHT  UVJ and affect upon the RIGHT ureter.   Little or no excretion of FDG on  the RIGHT but still with uptake of FDG by renal parenchyma. Degree of hydronephrosis is not substantially changed but lack of FDG excretion on the RIGHT is compatible with secondary sign of physiologic significance of ureteral obstruction.   RIGHT pelvic sidewall uptake without visible lesion, could represent tiny lymph node not visible on noncontrast imaging.   Aortic atherosclerosis.  Pulmonary emphysema. Aortic Atherosclerosis and Emphysema    03/17/2022 Initial Diagnosis   Urothelial carcinoma of distal ureter (HCC)  -03/14/2022, cystoscopy showed diffuse narrowing right distal ureter with hydroureter following up the distal ureter and extending proximally.  Right ureteroscopy showed nodular tumor distal ureter and a convincing area without tumor suspicious for extrinsic obstruction.s/p   right ureteral stent placement. Ureteral tumor showed a tiny fragments of fibrous tissue, interpretation limited by thermal and crush artifact.  Right distal ureter saline barbotage showed hight grade urothelial carcinoma.    03/17/2022 Cancer Staging   Staging form: Renal Pelvis and Ureter, AJCC 8th Edition - Clinical: Stage Unknown (cTX, cN0, cM0) - Signed by Rickard Patience, MD on 03/17/2022 Stage prefix: Initial diagnosis WHO/ISUP grade (low/high): High Grade Histologic grading system: 2 grade system   06/05/2022 Imaging   PET scan at [facility] showed 1.Interval placement of right-sided double-J ureteral stent with resolution of hydronephrosis and improved excretion of radiotracer.  2.There is persistent abnormal hypermetabolic tissue extending posterior lateral from the right aspect of the bladder encasing the distal ureter/stent which measures approximately 5.1 x 4.2 (CT image 243).  3.There are similar linear areas of abnormal hypermetabolic tissue seen to extend more superiorly along the right posterior pelvic wall/peritoneum, and towards the sciatic notch (fused image 238). Linear configuration suggests possible  perineural spread. Note that this soft tissue abuts and may encase pelvic vasculature including the external and internal iliac arteries.  4.NEW single subcentimeter but abnormal focus of FDG avidity in the right presacral region (CT image 231) measuring 1.6 cm representing new site of neoplastic disease   06/19/2022 - 08/10/2022 Radiation Therapy   Pelvis RT 06/19/22-08/21/22 Bladder RT 07/27/22- 08/20/22    09/18/2022 Imaging   PET scan at [facility] showed 1. Evidence of disease progression with multifocal new hypermetabolic lesions around the periphery of the liver as described above. The evaluation of these lesions is limited by significant misregistration between the PET and CT portions of the examination. Contrast-enhanced CT or MRI could be used for confirmation and/or guidance of biopsy as necessary. There is also a lung parenchymal nodule with new associated FDG uptake, also suspicious for a site of metastasis.   2. Although the right nephroureteral stent appears to be well positioned, the right kidney demonstrates slightly increased hydronephrosis with significant surrounding fat stranding and relatively delayed clearance of FDG. These findings could suggest stent malfunction or be related to upper urinary tract infection. Suggest clinical correlation with any patient signs or symptoms such as right flank pain, fever, and/or urinalysis findings.     09/21/2022 Imaging   MRI lumbar spine wo contrast 1. 6 mm nodule in the cauda equina at the L4-5 level. 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 Recommend MRI of the lumbar spine with contrast for further evaluation. 2. 12 mm T2 hyperintense lesion along the inferior aspect of the S1 vertebral body. This may represent focal involvement of multiple myeloma. No other discrete lesions are present. No pathologic fracture is present. 3.  Mild bilateral foraminal stenosis at L3-4 and L4-5. 4. Left paramedian annular tear  at L5-S1 near the left S1 nerve roots.    Genetic Testing   Negative genetic testing. No pathogenic variants identified on the [facility] Multi-Cancer+RNA panel. The report date is 11/19/2022.  The Multi-Cancer + RNA Panel offered by [facility] includes sequencing and/or deletion/duplication analysis of the following 70 genes:  AIP*, ALK, APC*, ATM*, AXIN2*, BAP1*, BARD1*, BLM*, BMPR1A*, BRCA1*, BRCA2*, BRIP1*, CDC73*, CDH1*, CDK4, CDKN1B*, CDKN2A, CHEK2*, CTNNA1*, DICER1*, EPCAM, EGFR, FH*, FLCN*, GREM1, HOXB13, KIT, LZTR1, MAX*, MBD4, MEN1*, MET, MITF, MLH1*, MSH2*, MSH3*, MSH6*, MUTYH*, NF1*, NF2*, NTHL1*, PALB2*, PDGFRA, PMS2*, POLD1*, POLE*, POT1*, PRKAR1A*, PTCH1*, PTEN*, RAD51C*, RAD51D*, RB1*, RET, SDHA*, SDHAF2*, SDHB*, SDHC*, SDHD*, SMAD4*, SMARCA4*, SMARCB1*, SMARCE1*, STK11*, SUFU*, TMEM127*, TP53*, TSC1*, TSC2*, VHL*. RNA analysis is performed for * genes.   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 Evidence of local prostate carcinoma within the pelvis with peritoneal implants along the bowel serosal surface as well as a metastatic LEFT iliac lymph node. 2. 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 3. Multifocal pleural metastasis in the RIGHT hemithorax. 4. No clear evidence of skeletal metastasis      Genetic Testing   Negative genetic testing. No pathogenic variants identified on the [facility] Multi-Cancer+RNA panel. The report date is 11/19/2022.  The Multi-Cancer + RNA Panel offered by [facility] includes sequencing and/or deletion/duplication analysis  of the following 70 genes:  AIP*, ALK, APC*, ATM*, AXIN2*, BAP1*, BARD1*, BLM*, BMPR1A*, BRCA1*, BRCA2*, BRIP1*, CDC73*, CDH1*, CDK4, CDKN1B*, CDKN2A, CHEK2*, CTNNA1*, DICER1*, EPCAM, EGFR, FH*, FLCN*, GREM1, HOXB13, KIT, LZTR1, MAX*, MBD4, MEN1*, MET, MITF, MLH1*, MSH2*, MSH3*, MSH6*, MUTYH*, NF1*, NF2*, NTHL1*, PALB2*, PDGFRA, PMS2*, POLD1*, POLE*, POT1*, PRKAR1A*, PTCH1*, PTEN*, RAD51C*, RAD51D*, RB1*, RET, SDHA*, SDHAF2*, SDHB*, SDHC*, SDHD*, SMAD4*, SMARCA4*, SMARCB1*, SMARCE1*, STK11*, SUFU*, TMEM127*, TP53*, TSC1*, TSC2*, VHL*. RNA analysis is performed for * genes.   12/13/2022 - 02/14/2023 Chemotherapy   Docetaxel (75) q21d  x4   02/09/2023 Imaging   PET scan at [facility] showed -Innumerable new hypermetabolic liver lesions, likely metastatic. In addition, there are new subpleural metastases and new right pleural effusion; new splenic lesion is also present.  -Focal uptake in the left basal ganglia; brain MR could be obtained if clinically indicated.  -Further decrease of excretion in the right kidney.  -Pelvic mass continues to decrease, but there is newly avid soft tissue in the pelvis near the left external iliac vessels.    03/12/2023 Imaging   PMSA PET scan done at [facility] showed -Subpleural foci, mediastinal lymph nodes, hepatic foci, splenic foci and abdominal-pelvic lymph nodes with intense Pylarify uptake, overall higher in number and somewhat different distribution compared to FDG PET/CT dated 02/09/2023, as detailed in the body of the report.   -Possible obstruction of the right kidney. Moderate to severe right hydronephrosis.      03/14/2023 Procedure   S/p liver mass biopsy at [facility].  A: Liver, biopsy: -Adenocarcinoma, high-grade, compatible with prostatic origin.    03/16/2023 -  Chemotherapy   Started on Xtanti 160mg  daily  04/10/23  decrease to 120mg  daily due to severe fatigue    11/27/22 UA showed RBC >50, moderate leukocytes, Urine culture negative.  Cipro did not provide him any  relieve. Was seen by symptomatic management clinic and was prescribed a course steroid and Keflex.  11/28/22 CXR showed bronchitis - URI symptom has improved.  11/29/22 US renal Mild right hydronephrosis and proximal hydroureter. Low level echoes within the visualized proximal right ureter and renal pelvis,which may reflect blood products or debris.2. Stent is partially visualized within the urinary bladder. Right ureteral jet was not definitively seen.  He was seen by [facility] urology and his symptoms were felt to be due to post treatment inflammation. He was recommended to take Flomax and NSAIDS.  Due to his CKD, we recommend him not to take NSAIDS.  Today he is here for chemotherapy treatments.  He continues to have increased urine frequently, burning with urination, and doesn't feel like he is emptying well. Also swelling of penis.  We arranged him to be seen by [facility] urology team today and his symptoms was felt to be due to stent discomfort versus underlying malignancy and constipation may also contribute to his symtoms.  Marland Kitchen He was recommended to start Gemtesa. Miralax for constipation.  Balanoposthitis, he will take one dose of diflucan.   INTERVAL HISTORY [name] [name] is a 82 y.o. male who has above history reviewed by me today presents for follow up visit for multiple myeloma and high-grade urothelial carcinoma, metastatic prostate cancer.  Patient continues to use weight.  Appetite is not good.  More weakness 04/16/2023 Off Xtandi due to poor tolerance.  Shortness of breath and abdominal distention improved after thoracentesis. He has had 3 thoracentesis done for recurrent pleural effusion.   Review of Systems  Constitutional:  Positive for fatigue. Negative for appetite change, chills, fever and unexpected weight change.  HENT:   Negative for hearing loss and voice change.   Eyes:  Negative for eye problems and icterus.  Respiratory:  Positive for shortness of breath. Negative for chest  tightness and cough.   Cardiovascular:  Negative for chest pain and leg swelling.  Gastrointestinal:  Positive for abdominal distention, abdominal pain and constipation. Negative for diarrhea.  Endocrine: Negative for hot flashes.  Genitourinary:  Positive for dysuria and frequency. Negative for difficulty urinating.   Musculoskeletal:  Negative for arthralgias.  Skin:  Negative for itching.  Neurological:  Negative for light-headedness and numbness.  Hematological:  Negative for adenopathy. Does not bruise/bleed easily.  Psychiatric/Behavioral:  Negative for confusion.      MEDICAL HISTORY:  Past Medical History:  Diagnosis Date   Age related osteoporosis    Anemia    Barrett's esophagus    Bradycardia    Cataract    CKD (chronic kidney disease) stage 3, GFR 30-59 ml/min (HCC)    Colon polyps    DDD (degenerative disc disease), cervical    DDD (degenerative disc disease), lumbar    Femur fracture (HCC)    Right   Fundic gland polyps of stomach, benign    Gastritis    GERD (gastroesophageal reflux disease)    Hematuria    Hemorrhage of rectum and anus    History of colon polyps    Hyperkalemia    Hypertension    Insomnia    Lesion of liver    going for bx at [facility] 03-15-23   Lumbago    Multiple myeloma (HCC)    OSA on CPAP    Osteopenia of  the elderly    PAC (premature atrial contraction)    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)    RLS (restless legs syndrome)    Skin cancer    Urothelial carcinoma of distal ureter (HCC)     SURGICAL HISTORY: Past Surgical History:  Procedure Laterality Date   BAND HEMORRHOIDECTOMY     COLONOSCOPY     COLONOSCOPY WITH PROPOFOL N/A 05/06/2018   Procedure: COLONOSCOPY WITH PROPOFOL;  Surgeon: Christena Deem, MD;  Location: [facility] ENDOSCOPY;  Service: Endoscopy;  Laterality: N/A;   COLONOSCOPY WITH PROPOFOL N/A 09/23/2018   Procedure: COLONOSCOPY WITH PROPOFOL;  Surgeon: Christena Deem, MD;  Location: [facility]  ENDOSCOPY;  Service: Endoscopy;  Laterality: N/A;   COLONOSCOPY WITH PROPOFOL N/A 03/30/2021   Procedure: COLONOSCOPY WITH PROPOFOL;  Surgeon: Toledo, Boykin Nearing, MD;  Location: [facility] ENDOSCOPY;  Service: Gastroenterology;  Laterality: N/A;   CYSTOSCOPY W/ URETERAL STENT PLACEMENT Right 03/14/2022   Procedure: CYSTOSCOPY WITH RETROGRADE PYELOGRAM/URETERAL STENT PLACEMENT;  Surgeon: [name], MD;  Location: [facility] ORS;  Service: Urology;  Laterality: Right;   CYSTOSCOPY W/ URETERAL STENT PLACEMENT Right 03/20/2023   Procedure: CYSTOSCOPY WITH STENT EXCHANGE;  Surgeon: [name], MD;  Location: [facility] ORS;  Service: Urology;  Laterality: Right;   ESOPHAGOGASTRODUODENOSCOPY (EGD) WITH PROPOFOL N/A 10/03/2016   Procedure: ESOPHAGOGASTRODUODENOSCOPY (EGD) WITH PROPOFOL;  Surgeon: Christena Deem, MD;  Location: [facility] ENDOSCOPY;  Service: Endoscopy;  Laterality: N/A;   EYE SURGERY     CATARACTS   EYELID SURGERY     FLEXIBLE SIGMOIDOSCOPY     FRACTURE SURGERY Right 2016   femur   FRACTURE SURGERY     ORIF DISTAL FEMUR FRACTURE     TONSILLECTOMY     TRANSURETHRAL RESECTION OF BLADDER TUMOR N/A 03/14/2022   Procedure: TRANSURETHRAL RESECTION OF BLADDER TUMOR (TURBT);  Surgeon: [name], MD;  Location: [facility] ORS;  Service: Urology;  Laterality: N/A;    SOCIAL HISTORY: Social History   Socioeconomic History   Marital status: Married    Spouse name: Not on file   Number of children: Not on file   Years of education: Not on file   Highest education level: Not on file  Occupational History   Not on file  Tobacco Use   Smoking status: Former    Packs/day: 1.00    Years: 15.00    Additional pack years: 0.00    Total pack years: 15.00    Types: Cigarettes    Quit date: 10/03/1974    Years since quitting: 48.5    Passive exposure: Past   Smokeless tobacco: Never  Vaping Use   Vaping Use: Never used  Substance and Sexual Activity   Alcohol use: Not Currently     Alcohol/week: 1.0 standard drink of alcohol    Types: 1 Cans of beer per week    Comment: 1 beer every 2 weeks   Drug use: No   Sexual activity: Yes    Birth control/protection: None  Other Topics Concern   Not on file  Social History Narrative   ** Merged History Encounter **       Social Determinants of Health   Financial Resource Strain: Medium Risk (01/22/2023)   Overall Financial Resource Strain (CARDIA)    Difficulty of Paying Living Expenses: Somewhat hard  Food Insecurity: No Food Insecurity (01/22/2023)   Hunger Vital Sign    Worried About Running Out of Food in the Last Year: Never true    Ran Out  of Food in the Last Year: Never true  Transportation Needs: No Transportation Needs (01/22/2023)   PRAPARE - Administrator, Civil Service (Medical): No    Lack of Transportation (Non-Medical): No  Physical Activity: Inactive (01/22/2023)   Exercise Vital Sign    Days of Exercise per Week: 0 days    Minutes of Exercise per Session: 0 min  Stress: Stress Concern Present (01/22/2023)   [employer] of Occupational Health - Occupational Stress Questionnaire    Feeling of Stress : To some extent  Social Connections: Socially Integrated (01/22/2023)   Social Connection and Isolation Panel [NHANES]    Frequency of Communication with Friends and Family: More than three times a week    Frequency of Social Gatherings with Friends and Family: More than three times a week    Attends Religious Services: 1 to 4 times per year    Active Member of [employer] or Organizations: No    Attends Engineer, structural: 1 to 4 times per year    Marital Status: Married  Catering manager Violence: Not At Risk (01/22/2023)   Humiliation, Afraid, Rape, and Kick questionnaire    Fear of Current or Ex-Partner: No    Emotionally Abused: No    Physically Abused: No    Sexually Abused: No    FAMILY HISTORY: Family History  Problem Relation Age of Onset   Breast cancer Mother         dx 53s-50s   Prostate cancer Paternal Grandfather 64   Breast cancer Cousin     ALLERGIES:  is allergic to ciprofloxacin, gabapentin, prolia [denosumab], demerol [meperidine], and nsaids.  MEDICATIONS:  Current Outpatient Medications  Medication Sig Dispense Refill   acetaminophen (TYLENOL) 650 MG CR tablet Take 650 mg by mouth every 8 (eight) hours as needed for pain.     dexamethasone (DECADRON) 1 MG tablet Take 1 tablet (1 mg total) by mouth 2 (two) times daily with a meal. 15 tablet 1   ipratropium (ATROVENT) 0.03 % nasal spray Place 1 spray into both nostrils in the morning.     melatonin 5 MG TABS Take 5 mg by mouth at bedtime.     nystatin cream (MYCOSTATIN) Apply 1 Application topically 2 (two) times daily. 30 g 0   senna (SENOKOT) 8.6 MG TABS tablet Take 2 tablets (17.2 mg total) by mouth daily. 120 tablet 0   traZODone (DESYREL) 50 MG tablet Take 25 mg by mouth at bedtime.     Vibegron (GEMTESA) 75 MG TABS Take 1 tablet (75 mg total) by mouth daily. 28 tablet 0   losartan (COZAAR) 25 MG tablet Take 25 mg by mouth every morning. (Patient not taking: Reported on 04/26/2023)     Meth-Hyo-M Bl-Na Phos-Ph Sal (URIBEL) 118 MG CAPS Take 1 capsule (118 mg total) by mouth 2 (two) times daily. (Patient not taking: Reported on 04/26/2023) 60 capsule 1   tamsulosin (FLOMAX) 0.4 MG CAPS capsule Take 1 capsule by mouth daily after breakfast. (Patient not taking: Reported on 04/26/2023)     No current facility-administered medications for this visit.     PHYSICAL EXAMINATION: ECOG PERFORMANCE STATUS: 1 - Symptomatic but completely ambulatory Today's Vitals   04/26/23 1122  BP: (!) 123/50  Pulse: 68  Resp: 18  Temp: (!) 97.3 F (36.3 C)  Weight: 136 lb 1.6 oz (61.7 kg)  PainSc: 0-No pain   Body mass index is 21.32 kg/m.   Physical Exam Constitutional:  General: He is not in acute distress. HENT:     Head: Normocephalic and atraumatic.  Eyes:     General: No scleral  icterus. Cardiovascular:     Rate and Rhythm: Normal rate.  Pulmonary:     Effort: Pulmonary effort is normal. No respiratory distress.     Breath sounds: No wheezing.  Abdominal:     General: There is distension.     Palpations: Abdomen is soft.     Tenderness: There is abdominal tenderness.  Musculoskeletal:        General: No deformity. Normal range of motion.     Cervical back: Normal range of motion and neck supple.  Skin:    General: Skin is warm and dry.     Findings: No erythema.  Neurological:     Mental Status: He is alert and oriented to person, place, and time. Mental status is at baseline.     Cranial Nerves: No cranial nerve deficit.  Psychiatric:        Mood and Affect: Mood normal.      LABORATORY DATA:  I have reviewed the data as listed    Latest Ref Rng & Units 04/26/2023   10:55 AM 04/19/2023    8:55 AM 04/10/2023    9:27 AM  CBC  WBC 4.0 - 10.5 K/uL 8.3  6.2  6.0   Hemoglobin 13.0 - 17.0 g/dL 60.4  54.0  98.1   Hematocrit 39.0 - 52.0 % 38.6  36.4  34.8   Platelets 150 - 400 K/uL 340  312  266        Latest Ref Rng & Units 04/26/2023   10:55 AM 04/19/2023    8:54 AM 04/10/2023    9:27 AM  CMP  Glucose 70 - 99 mg/dL 191  478  295   BUN 8 - 23 mg/dL 58  45  44   Creatinine 0.61 - 1.24 mg/dL 6.21  3.08  6.57   Sodium 135 - 145 mmol/L 138  135  138   Potassium 3.5 - 5.1 mmol/L 4.6  4.3  5.0   Chloride 98 - 111 mmol/L 101  101  101   CO2 22 - 32 mmol/L 27  23  26    Calcium 8.9 - 10.3 mg/dL 9.2  9.0  9.8   Total Protein 6.5 - 8.1 g/dL 5.9  5.6  5.8   Total Bilirubin 0.3 - 1.2 mg/dL 0.4  0.5  0.6   Alkaline Phos 38 - 126 U/L 136  150  158   AST 15 - 41 U/L 30  41  44   ALT 0 - 44 U/L 16  18  26      Iron/TIBC/Ferritin/ %Sat    Component Value Date/Time   IRON 64 01/24/2023 0835   TIBC 262 01/24/2023 0835   FERRITIN 168 01/24/2023 0835   IRONPCTSAT 24 01/24/2023 0835       RADIOGRAPHIC STUDIES: I have personally reviewed the radiological  images as listed and agreed with the findings in the report.  US THORACENTESIS ASP PLEURAL SPACE W/IMG GUIDE  Result Date: 04/25/2023 INDICATION: History of multiple myeloma, high-grade urothelial carcinoma and metastatic prostate cancer with recurrent right pleural effusion. Patient referred for therapeutic right thoracentesis. EXAM: ULTRASOUND GUIDED THERAPEUTIC RIGHT THORACENTESIS MEDICATIONS: 10 mL 1 % lidocaine COMPLICATIONS: None immediate. PROCEDURE: An ultrasound guided thoracentesis was thoroughly discussed with the patient and questions answered. The benefits, risks, alternatives and complications were also discussed. The patient understands and wishes to proceed  with the procedure. Written consent was obtained. Ultrasound was performed to localize and mark an adequate pocket of fluid in the RIGHT chest. The area was then prepped and draped in the normal sterile fashion. 1% Lidocaine was used for local anesthesia. Under ultrasound guidance a 6 Fr Safe-T-Centesis catheter was introduced. Thoracentesis was performed. The catheter was removed and a dressing applied. FINDINGS: A total of approximately 2.4 L of hazy, dark amber fluid was removed. IMPRESSION: Successful ultrasound guided therapeutic RIGHT thoracentesis yielding 2.4 L of pleural fluid. Procedure performed by Alex Gardener, NP Roanna Banning, MD [facility] Electronically Signed   By: Roanna Banning M.D.   On: 04/25/2023 10:58   DG Chest Port 1 View  Result Date: 04/25/2023 CLINICAL DATA:  Status post thoracentesis EXAM: PORTABLE CHEST 1 VIEW COMPARISON:  Chest XR, 04/19/2023.  CT chest, 02/12/2013. FINDINGS: Cardiomediastinal silhouette is within normal limits. Lungs are well inflated. No focal consolidation or mass. No pleural effusion or pneumothorax. No acute displaced fracture. IMPRESSION: 1. No pneumothorax post RIGHT thoracentesis. 2. No acute cardiopulmonary process. Electronically  Signed   By: Roanna Banning M.D.   On: 04/25/2023 09:33   US THORACENTESIS ASP PLEURAL SPACE W/IMG GUIDE  Result Date: 04/20/2023 INDICATION: Right pleural effusion EXAM: ULTRASOUND GUIDED RIGHT THORACENTESIS MEDICATIONS: 8 cc 1% lidocaine COMPLICATIONS: None immediate. PROCEDURE: An ultrasound guided thoracentesis was thoroughly discussed with the patient and questions answered. The benefits, risks, alternatives and complications were also discussed. The patient understands and wishes to proceed with the procedure. Written consent was obtained. Ultrasound was performed to localize and mark an adequate pocket of fluid in the right chest. The area was then prepped and draped in the normal sterile fashion. 1% Lidocaine was used for local anesthesia. Under ultrasound guidance a 6 Fr Safe-T-Centesis catheter was introduced. Thoracentesis was performed. The catheter was removed and a dressing applied. FINDINGS: A total of approximately 2.4 L of amber fluid was removed. IMPRESSION: Successful ultrasound guided right thoracentesis yielding 2.4 L of pleural fluid. Procedure performed by Mina Marble, PA-C Electronically Signed   By: Olive Bass M.D.   On: 04/20/2023 08:02   DG Chest 1 View  Result Date: 04/19/2023 CLINICAL DATA:  [id number] S/P thoracentesis [id number] EXAM: CHEST  1 VIEW COMPARISON:  Multiple exams earlier same day FINDINGS: Status post right thoracentesis. Trace right effusion remains. No left effusion. Normal appearance of the cardiomediastinal silhouette. Previously seen lucency in the right lung is no longer visualized, no pneumothorax. No lobar consolidation. No acute osseous abnormality. IMPRESSION: Status post right thoracentesis, no pneumothorax visualized on repeat examination. Electronically Signed   By: Olive Bass M.D.   On: 04/19/2023 16:10   [place] ASCITES (ABDOMEN LIMITED)  Result Date: 04/19/2023 CLINICAL DATA:  Bloating, abdominal distension EXAM: LIMITED ABDOMEN ULTRASOUND FOR  ASCITES TECHNIQUE: Limited ultrasound survey for ascites was performed in all four abdominal quadrants. COMPARISON:  04/11/2023 CT abdomen pelvis FINDINGS: Trace free fluid in the left lower quadrant and left upper quadrant. IMPRESSION: Trace free fluid in the left lower quadrant and left upper quadrant. Electronically Signed   By: Wiliam Ke M.D.   On: 04/19/2023 16:04   DG Chest Port 1 View  Result Date: 04/19/2023 CLINICAL DATA:  Post thoracentesis, Large right EXAM: PORTABLE CHEST 1 VIEW COMPARISON:  04/19/2023 at 1202 hours FINDINGS: Normal heart size. Small right pleural effusion, significantly decreased in size following thoracentesis. Tiny right lateral pneumothorax versus skin fold. Improved aeration at the right lung  base. Left lung remains clear. IMPRESSION: 1. Small right pleural effusion, significantly decreased in size following thoracentesis. 2. Tiny right lateral pneumothorax versus skin fold. Short interval follow-up chest radiograph is recommended. These results will be called to the ordering clinician or representative by the Radiologist Assistant, and communication documented in the PACS or [employer]. Electronically Signed   By: Duanne Guess D.O.   On: 04/19/2023 15:41   DG Abd 1 View  Result Date: 04/19/2023 CLINICAL DATA:  Bloating EXAM: ABDOMEN - 1 VIEW COMPARISON:  04/10/2023 FINDINGS: Nonobstructive bowel gas pattern. Right-sided nephroureteral stent is again noted, unchanged in positioning. No gross free intraperitoneal air on supine view. Right-sided pleural effusion. Prior right hip ORIF. IMPRESSION: 1. Nonobstructive bowel gas pattern. 2. Right-sided pleural effusion. Electronically Signed   By: Duanne Guess D.O.   On: 04/19/2023 12:22   DG Chest 2 View  Result Date: 04/19/2023 CLINICAL DATA:  Shortness of breath.  Prostate cancer EXAM: CHEST - 2 VIEW COMPARISON:  04/11/2023 FINDINGS: Moderate-large right-sided pleural effusion with associated right basilar  opacity. Left lung is clear. Heart size is normal. IMPRESSION: Moderate-large right-sided pleural effusion with associated right basilar opacity, likely atelectasis. Electronically Signed   By: Duanne Guess D.O.   On: 04/19/2023 12:21   CT Abdomen Pelvis Wo Contrast  Result Date: 04/11/2023 CLINICAL DATA:  Metastatic prostate cancer, worsening abdominal distension for 1-2 weeks. History multiple myeloma and urothelial carcinoma the distal ureter. * Tracking Code: BO * EXAM: CT ABDOMEN AND PELVIS WITHOUT CONTRAST TECHNIQUE: Multidetector CT imaging of the abdomen and pelvis was performed following the standard protocol without IV contrast. RADIATION DOSE REDUCTION: This exam was performed according to the departmental dose-optimization program which includes automated exposure control, adjustment of the mA and/or kV according to patient size and/or use of iterative reconstruction technique. COMPARISON:  PET 12/23/2022 and CT abdomen pelvis 02/07/2019. FINDINGS: Lower chest: Basilar pulmonary parenchymal cystic changes. Moderate right pleural effusion with compressive atelectasis in the right lower lobe. Known enlarging pleural nodules and masses in the posterior right hemithorax measure up to 2.2 x 4.6 cm. Heart is at the upper limits of normal in size. No pericardial effusion. Distal esophagus is grossly unremarkable. Hepatobiliary: Heterogeneous masses are seen throughout the liver and have enlarged significantly from 12/05/2022, measuring up to 6.3 x 6.8 cm. Gallbladder is grossly unremarkable. No biliary ductal dilatation. Pancreas: Negative. Spleen: Negative. Adrenals/Urinary Tract: Adrenal glands are unremarkable. Moderate right hydronephrosis with a double-J right ureteral stent in place with the proximal loop in the right intrarenal collecting system and distal loop in the bladder. Hyperattenuating and hypoattenuating lesions in the left kidney, difficult to further characterize without post-contrast  imaging. Left ureter is decompressed. Bladder is low in volume. Stomach/Bowel: Stomach, small bowel, appendix and colon are unremarkable. Vascular/Lymphatic: Periportal lymph nodes measure up to 2.0 cm. Right juxtadiaphragmatic lymph nodes measure up to 2.3 cm. Abdominal and retroperitoneal lymph nodes measure up to 1.4 cm along the left external iliac chain (2/59), new. Reproductive: Prostate is atrophic or absent. Other: Mild presacral edema.  Small scattered ascites. Musculoskeletal: A lipoma is seen in the posterior right thigh. Screws in the right proximal femur. Osteopenia. Degenerative changes in the spine. Bilateral L5 pars defects without alignment abnormality. Old right rib fracture. No worrisome lytic or sclerotic lesions. IMPRESSION: 1. Small scattered ascites. No additional findings to explain the patient's abdominal distension. 2. Progressive pleural, hepatic and nodal metastatic disease. 3. Moderate right hydronephrosis with double-J ureteral stent in place. 4.  Moderate right pleural effusion with compressive atelectasis in the right lower lobe. Electronically Signed   By: Leanna Battles M.D.   On: 04/11/2023 16:14   US THORACENTESIS ASP PLEURAL SPACE W/IMG GUIDE  Result Date: 04/11/2023 INDICATION: History of multiple myeloma and high-grade urothelial carcinoma, metastatic prostate cancer. Imaging found patient to have large right pleural effusion. Patient was referred for diagnostic and therapeutic right thoracentesis. EXAM: ULTRASOUND GUIDED DIAGNOSTIC AND THERAPEUTIC RIGHT THORACENTESIS MEDICATIONS: 15 mL 1 % lidocaine, topical lidocaine spray COMPLICATIONS: None immediate. PROCEDURE: An ultrasound guided thoracentesis was thoroughly discussed with the patient and questions answered. The benefits, risks, alternatives and complications were also discussed. The patient understands and wishes to proceed with the procedure. Written consent was obtained. Ultrasound was performed to localize and  mark an adequate pocket of fluid in the right chest. The area was then prepped and draped in the normal sterile fashion. 1% Lidocaine was used for local anesthesia. Under ultrasound guidance a 6 Fr Safe-T-Centesis catheter was introduced. Thoracentesis was performed. The catheter was removed and a dressing applied. FINDINGS: A total of approximately 2 L of clear, amber fluid was removed. Samples were sent to the laboratory as requested by the clinical team. IMPRESSION: Successful ultrasound guided right thoracentesis yielding 2 L of pleural fluid. Read by: Alex Gardener, AGNP-BC Electronically Signed   By: Olive Bass M.D.   On: 04/11/2023 09:50   DG Chest Port 1 View  Result Date: 04/11/2023 CLINICAL DATA:  Follow-up thoracentesis EXAM: PORTABLE CHEST 1 VIEW COMPARISON:  04/09/2023 FINDINGS: Left chest remains clear. Marked reduction in the amount of pleural fluid on the right, although a small amount does persist. No pneumothorax. Improved aeration of the right lower lung. IMPRESSION: Marked reduction in the amount of pleural fluid on the right following thoracentesis. No pneumothorax. Improved aeration of the right lower lung. Electronically Signed   By: Paulina Fusi M.D.   On: 04/11/2023 09:44   DG Abd 2 Views  Result Date: 04/10/2023 CLINICAL DATA:  Abdominal distension. History of metastatic prostate cancer. EXAM: ABDOMEN - 2 VIEW COMPARISON:  December 13, 2022. FINDINGS: The bowel gas pattern is normal. There is no evidence of free air. Right-sided ureteral stent is noted in grossly good position. Phleboliths are noted in the pelvis. IMPRESSION: No abnormal bowel dilatation. Electronically Signed   By: Lupita Raider M.D.   On: 04/10/2023 12:00   DG Chest 2 View  Result Date: 04/09/2023 CLINICAL DATA:  Shortness of breath, congestion. EXAM: CHEST - 2 VIEW COMPARISON:  November 28, 2022.  December 05, 2022. FINDINGS: The heart size and mediastinal contours are within normal limits. Large pleural based  mass is noted anteriorly in right upper lobe. Moderate right pleural effusion is noted with associated right basilar atelectasis. Left lung is grossly unremarkable. The visualized skeletal structures are unremarkable. IMPRESSION: Moderate right pleural effusion is noted with associated right basilar atelectasis. Pleural based mass is noted anteriorly in right upper lobe consistent with metastatic disease as described on prior PET scan. Electronically Signed   By: Lupita Raider M.D.   On: 04/09/2023 16:29   DG OR UROLOGY CYSTO IMAGE ([facility] ONLY)  Result Date: 03/20/2023 There is no interpretation for this exam.  This order is for images obtained during a surgical procedure.  Please See "Surgeries" Tab for more information regarding the procedure.

## 2023-04-26 NOTE — Assessment & Plan Note (Signed)
Encourage oral hydration and avoid nephrotoxins.   

## 2023-04-26 NOTE — Assessment & Plan Note (Signed)
Recommend placement of Pleurx catheter.

## 2023-04-26 NOTE — Progress Notes (Signed)
Pt here for follow up. Pt has question regarding pleurx cath. He would like to be set up for a thoracentesis next Tuesday. Pt reports dizzy spell yesterday

## 2023-04-26 NOTE — Assessment & Plan Note (Addendum)
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  Patient has received 3 cycles of docetaxel.  PSA has not responded well, this is a possible refractory case Reviewed PET PMSA results, liver mass biopsy were reviewed with patient, he has developed a new liver lesions, pleural lesions and splenic lesions, pathology showed prostate cancer disease progression.  Xtandi stopped due to poor tolerance.  Not qualified for Pluvicto at [facility] due to poor kidney function. Patient has decided not to proceed with any additional chemotherapy, transition to comfort care/hospice.  He has discussed with palliative care service and will meet hospice nurse tomorrow.

## 2023-05-01 ENCOUNTER — Encounter: Payer: Self-pay | Admitting: Internal Medicine

## 2023-05-01 ENCOUNTER — Ambulatory Visit
Admission: RE | Admit: 2023-05-01 | Discharge: 2023-05-01 | Disposition: A | Discharge status: Home / Self Care | Source: Ambulatory Visit | Attending: Internal Medicine | Admitting: Internal Medicine

## 2023-05-01 ENCOUNTER — Ambulatory Visit
Admission: RE | Admit: 2023-05-01 | Discharge: 2023-05-01 | Disposition: A | Discharge status: Home / Self Care | Source: Ambulatory Visit | Attending: Oncology | Admitting: Oncology

## 2023-05-01 ENCOUNTER — Other Ambulatory Visit: Payer: Self-pay

## 2023-05-01 ENCOUNTER — Telehealth: Payer: Medicare Other | Admitting: Hospice and Palliative Medicine

## 2023-05-01 ENCOUNTER — Other Ambulatory Visit: Payer: Self-pay | Admitting: Internal Medicine

## 2023-05-01 ENCOUNTER — Other Ambulatory Visit: Payer: Self-pay | Admitting: Oncology

## 2023-05-01 ENCOUNTER — Other Ambulatory Visit (HOSPITAL_COMMUNITY): Payer: Self-pay

## 2023-05-01 DIAGNOSIS — J90 Pleural effusion, not elsewhere classified: Secondary | ICD-10-CM

## 2023-05-01 DIAGNOSIS — C61 Malignant neoplasm of prostate: Secondary | ICD-10-CM

## 2023-05-01 DIAGNOSIS — Z9889 Other specified postprocedural states: Secondary | ICD-10-CM

## 2023-05-01 DIAGNOSIS — C787 Secondary malignant neoplasm of liver and intrahepatic bile duct: Secondary | ICD-10-CM | POA: Diagnosis not present

## 2023-05-01 MED ORDER — LIDOCAINE HCL (PF) 1 % IJ SOLN
10.0000 mL | Freq: Once | INTRAMUSCULAR | Status: AC
  Administered 2023-05-01: 10 mL via INTRADERMAL
  Filled 2023-05-01: qty 10

## 2023-05-01 NOTE — Procedures (Signed)
PROCEDURE SUMMARY:  Successful US guided therapeutic right thoracentesis. Yielded 2.2 L of dark bloody fluid. Pt tolerated procedure well. No immediate complications.  Specimen not sent for labs. CXR ordered.  EBL < 1 mL  Shon Hough, AGNP 05/01/2023 10:37 AM

## 2023-05-03 ENCOUNTER — Other Ambulatory Visit: Payer: Self-pay | Admitting: Internal Medicine

## 2023-05-03 ENCOUNTER — Other Ambulatory Visit: Payer: Self-pay | Admitting: Radiology

## 2023-05-03 DIAGNOSIS — J90 Pleural effusion, not elsewhere classified: Secondary | ICD-10-CM

## 2023-05-03 NOTE — Progress Notes (Signed)
Patient for IR Pleur-x Cath Insertion on Fri 05/04/2023, I called and spoke with the patient's spouse, Steward Drone on the phone and gave pre-procedure instructions. Steward Drone was made aware to have the pt here at 12p, NPO after MN prior to procedure as well as driver post procedure/recovery/discharge. Pt stated understanding.  Called 05/02/2023

## 2023-05-03 NOTE — Telephone Encounter (Signed)
Pt will have procedure on 5/31. Spoke to pt this morning and he stated that hospice will be coming to his house this afternoon. He will call back to let us know if hospice will provide supplies.

## 2023-05-03 NOTE — H&P (Signed)
Chief Complaint: Patient was seen in consultation today for metastatic prostate cancer, recurrent pleural effusion at the request of Yu,Zhou  Referring Physician(s): Yu,Zhou  Supervising Physician: Pernell Dupre  Patient Status: [facility] - Out-pt  History of Present Illness:  [name] [name] is a 82 y.o. male followed by oncology for multiple myeloma, high-grade urothelial carcinoma and metastatic prostate cancer and well-known to IR service after numerous right-sided thoracentesis for recurrent pleural effusion, liver biopsy 10/06/2022, bone marrow biopsy 03/06/2022 and renal biopsy 03/01/2022.  Most recently patient has required large-volume (greater than 2 L) thoracentesis weekly for recurrent right pleural effusion.  Patient has been referred to IR for pleural drain placement.  Patient denies fever, chills, CP or emesis. He endorses loss of appetite, fatigue, cough, SOB, abdominal pain, nausea, dizziness and weakness. He is n.p.o. per order. Patient states he wishes to suspend DNR status for this procedure.   Past Medical History:  Diagnosis Date   Age related osteoporosis    Anemia    Barrett's esophagus    Bradycardia    Cataract    CKD (chronic kidney disease) stage 3, GFR 30-59 ml/min (HCC)    Colon polyps    DDD (degenerative disc disease), cervical    DDD (degenerative disc disease), lumbar    Femur fracture (HCC)    Right   Fundic gland polyps of stomach, benign    Gastritis    GERD (gastroesophageal reflux disease)    Hematuria    Hemorrhage of rectum and anus    History of colon polyps    Hyperkalemia    Hypertension    Insomnia    Lesion of liver    going for bx at [facility] 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)    RLS (restless legs syndrome)    Skin cancer    Urothelial carcinoma of distal ureter (HCC)     Past  Surgical History:  Procedure Laterality Date   BAND HEMORRHOIDECTOMY     COLONOSCOPY     COLONOSCOPY WITH PROPOFOL N/A 05/06/2018   Procedure: COLONOSCOPY WITH PROPOFOL;  Surgeon: Christena Deem, MD;  Location[facility] ENDOSCOPY;  Service: Endoscopy;  Laterality: N/A;   COLONOSCOPY WITH PROPOFOL N/A 09/23/2018   Procedure: COLONOSCOPY WITH PROPOFOL;  Surgeon: Christena Deem, MD;  Location: [facility] ENDOSCOPY;  Service: Endoscopy;  Laterality: N/A;   COLONOSCOPY WITH PROPOFOL N/A 03/30/2021   Procedure: COLONOSCOPY WITH PROPOFOL;  Surgeon: Toledo, Boykin Nearing, MD;  Location: [facility] ENDOSCOPY;  Service: Gastroenterology;  Laterality: N/A;   CYSTOSCOPY W/ URETERAL STENT PLACEMENT Right 03/14/2022   Procedure: CYSTOSCOPY WITH RETROGRADE PYELOGRAM/URETERAL STENT PLACEMENT;  Surgeon: [name], MD;  Location: [facility] ORS;  Service: Urology;  Laterality: Right;   CYSTOSCOPY W/ URETERAL STENT PLACEMENT Right 03/20/2023   Procedure: CYSTOSCOPY WITH STENT EXCHANGE;  Surgeon: [name], MD;  Location: [facility] ORS;  Service: Urology;  Laterality: Right;   ESOPHAGOGASTRODUODENOSCOPY (EGD) WITH PROPOFOL N/A 10/03/2016   Procedure: ESOPHAGOGASTRODUODENOSCOPY (EGD) WITH PROPOFOL;  Surgeon: Christena Deem, MD;  Location: [facility] ENDOSCOPY;  Service: Endoscopy;  Laterality: N/A;   EYE SURGERY     CATARACTS   EYELID SURGERY     FLEXIBLE SIGMOIDOSCOPY     FRACTURE SURGERY Right 2016   femur   FRACTURE SURGERY     ORIF  DISTAL FEMUR FRACTURE     TONSILLECTOMY     TRANSURETHRAL RESECTION OF BLADDER TUMOR N/A 03/14/2022   Procedure: TRANSURETHRAL RESECTION OF BLADDER TUMOR (TURBT);  Surgeon: [name], MD;  Location: [facility] ORS;  Service: Urology;  Laterality: N/A;    Allergies: Ciprofloxacin, Gabapentin, Prolia [denosumab], Demerol [meperidine], and Nsaids  Medications: Prior to Admission medications   Medication Sig Start Date End Date Taking? Authorizing Provider  acetaminophen (TYLENOL) 650 MG  CR tablet Take 650 mg by mouth every 8 (eight) hours as needed for pain.    [provider]  dexamethasone (DECADRON) 1 MG tablet Take 1 tablet (1 mg total) by mouth 2 (two) times daily with a meal. 04/19/23   Borders, Daryl Eastern, NP  ipratropium (ATROVENT) 0.03 % nasal spray Place 1 spray into both nostrils in the morning. 02/13/18   [provider]  losartan (COZAAR) 25 MG tablet Take 25 mg by mouth every morning. Patient not taking: Reported on 04/26/2023    [provider]  melatonin 5 MG TABS Take 5 mg by mouth at bedtime.    [provider]  Meth-Hyo-M Bl-Na Phos-Ph Sal (URIBEL) 118 MG CAPS Take 1 capsule (118 mg total) by mouth 2 (two) times daily. Patient not taking: Reported on 04/26/2023 03/15/23 05/14/23  [name], MD  nystatin cream (MYCOSTATIN) Apply 1 Application topically 2 (two) times daily. 12/12/22   Borders, Daryl Eastern, NP  senna (SENOKOT) 8.6 MG TABS tablet Take 2 tablets (17.2 mg total) by mouth daily. 06/21/22   Rickard Patience, MD  tamsulosin (FLOMAX) 0.4 MG CAPS capsule Take 1 capsule by mouth daily after breakfast. Patient not taking: Reported on 04/26/2023 12/11/22 12/11/23  [provider]  traZODone (DESYREL) 50 MG tablet Take 25 mg by mouth at bedtime.    [provider]  Vibegron (GEMTESA) 75 MG TABS Take 1 tablet (75 mg total) by mouth daily. 04/20/23   Stoioff, Verna Czech, MD     Family History  Problem Relation Age of Onset   Breast cancer Mother        dx 69s-50s   Prostate cancer Paternal Grandfather 2   Breast cancer Cousin     Social History   Socioeconomic History   Marital status: Married    Spouse name: Steward Drone   Number of children: 1   Years of education: Not on file   Highest education level: Not on file  Occupational History   Not on file  Tobacco Use   Smoking status: Former    Packs/day: 1.00    Years: 15.00    Additional pack years: 0.00    Total pack years: 15.00    Types: Cigarettes    Quit date:  10/03/1974    Years since quitting: 48.6    Passive exposure: Past   Smokeless tobacco: Never  Vaping Use   Vaping Use: Never used  Substance and Sexual Activity   Alcohol use: Not Currently    Alcohol/week: 1.0 standard drink of alcohol    Types: 1 Cans of beer per week    Comment: 1 beer every 2 weeks   Drug use: No   Sexual activity: Yes    Birth control/protection: None  Other Topics Concern   Not on file  Social History Narrative   ** Merged History Encounter **    Lives with wife at home: no pets    Daughter Karens lives in [place]    Social Determinants of Health   Financial  Resource Strain: Medium Risk (01/22/2023)   Overall Financial Resource Strain (CARDIA)    Difficulty of Paying Living Expenses: Somewhat hard  Food Insecurity: No Food Insecurity (01/22/2023)   Hunger Vital Sign    Worried About Running Out of Food in the Last Year: Never true    Ran Out of Food in the Last Year: Never true  Transportation Needs: No Transportation Needs (01/22/2023)   PRAPARE - Administrator, Civil Service (Medical): No    Lack of Transportation (Non-Medical): No  Physical Activity: Inactive (01/22/2023)   Exercise Vital Sign    Days of Exercise per Week: 0 days    Minutes of Exercise per Session: 0 min  Stress: Stress Concern Present (01/22/2023)   [employer] of Occupational Health - Occupational Stress Questionnaire    Feeling of Stress : To some extent  Social Connections: Socially Integrated (01/22/2023)   Social Connection and Isolation Panel [NHANES]    Frequency of Communication with Friends and Family: More than three times a week    Frequency of Social Gatherings with Friends and Family: More than three times a week    Attends Religious Services: 1 to 4 times per year    Active Member of [employer] or Organizations: No    Attends Engineer, structural: 1 to 4 times per year    Marital Status: Married     Review of Systems: A 12 point ROS  discussed and pertinent positives are indicated in the HPI above.  All other systems are negative.  Review of Systems  Constitutional:  Positive for appetite change and fatigue. Negative for chills and fever.  Respiratory:  Positive for cough and shortness of breath.   Cardiovascular:  Negative for chest pain.  Gastrointestinal:  Positive for abdominal pain and nausea. Negative for vomiting.  Neurological:  Positive for dizziness and weakness.    Vital Signs: BP 128/64   Pulse 64   Temp 98.5 F (36.9 C) (Oral)   Resp 20   Ht 5\' 7"  (1.702 m)   Wt 131 lb (59.4 kg)   SpO2 98%   BMI 20.52 kg/m   Advance Care Plan: The advanced care plan/surrogate decision maker was discussed at the time of visit and documented in the medical record.  Patient is a full CODE STATUS for this procedure.  Physical Exam Vitals reviewed.  Constitutional:      General: He is not in acute distress.    Appearance: He is ill-appearing.  HENT:     Head: Normocephalic and atraumatic.     Mouth/Throat:     Mouth: Mucous membranes are dry.     Pharynx: Oropharynx is clear.  Eyes:     Extraocular Movements: Extraocular movements intact.     Pupils: Pupils are equal, round, and reactive to light.  Cardiovascular:     Rate and Rhythm: Normal rate and regular rhythm.     Pulses: Normal pulses.     Heart sounds: Normal heart sounds. No murmur heard. Pulmonary:     Effort: Pulmonary effort is normal. No respiratory distress.     Comments: Absent breath sounds right lower lobe Abdominal:     General: Bowel sounds are normal. There is no distension.     Palpations: Abdomen is soft.     Tenderness: There is no abdominal tenderness. There is no guarding.  Musculoskeletal:     Right lower leg: No edema.     Left lower leg: No edema.  Skin:  General: Skin is warm and dry.  Neurological:     Mental Status: He is alert.  Psychiatric:        Mood and Affect: Mood normal.        Behavior: Behavior normal.         Thought Content: Thought content normal.        Judgment: Judgment normal.     Imaging: US THORACENTESIS ASP PLEURAL SPACE W/IMG GUIDE  Result Date: 05/01/2023 INDICATION: History of multiple myeloma, high-grade urothelial carcinoma and metastatic prostate cancer with recurrent right pleural effusion. Patient referred for therapeutic right thoracentesis EXAM: ULTRASOUND GUIDED THERAPEUTIC RIGHT THORACENTESIS MEDICATIONS: 10 mL 1 % lidocaine COMPLICATIONS: None immediate. PROCEDURE: An ultrasound guided thoracentesis was thoroughly discussed with the patient and questions answered. The benefits, risks, alternatives and complications were also discussed. The patient understands and wishes to proceed with the procedure. Written consent was obtained. Ultrasound was performed to localize and mark an adequate pocket of fluid in the right chest. The area was then prepped and draped in the normal sterile fashion. 1% Lidocaine was used for local anesthesia. Under ultrasound guidance a 6 Fr Safe-T-Centesis catheter was introduced. Thoracentesis was performed. The catheter was removed and a dressing applied. FINDINGS: A total of approximately 2.2 L of dark, bloody fluid was removed. IMPRESSION: Successful ultrasound guided right thoracentesis yielding 2.2 L of pleural fluid. Procedure was performed by Alex Gardener, NP and supervised by Irish Lack, MD Electronically Signed   By: Irish Lack M.D.   On: 05/01/2023 12:18   DG Chest Port 1 View  Result Date: 05/01/2023 CLINICAL DATA:  Provided history: Post thoracentesis. Status post thoracentesis. Metastatic prostate cancer. EXAM: PORTABLE CHEST 1 VIEW COMPARISON:  Prior chest radiographs 04/25/2023 and earlier FINDINGS: Heart size within normal limits. No evidence of pneumothorax status post thoracentesis. Blunting of the right lateral costophrenic angle, likely reflecting a small residual pleural effusion. Ill-defined opacities within the perihilar right  lung and medial right lung base. Minimal linear atelectasis within the lateral left lung base. Degenerative changes of the spine. IMPRESSION: 1. No evidence of pneumothorax status post right thoracentesis. 2. Small residual right pleural effusion. 3. Ill-defined opacities within the perihilar right lung and medial right lung base. This may reflect atelectasis. However, correlate clinically to exclude signs/symptoms that would suggest pneumonia. 4. Minimal left lung base atelectasis. Electronically Signed   By: Jackey Loge D.O.   On: 05/01/2023 10:20   US THORACENTESIS ASP PLEURAL SPACE W/IMG GUIDE  Result Date: 04/25/2023 INDICATION: History of multiple myeloma, high-grade urothelial carcinoma and metastatic prostate cancer with recurrent right pleural effusion. Patient referred for therapeutic right thoracentesis. EXAM: ULTRASOUND GUIDED THERAPEUTIC RIGHT THORACENTESIS MEDICATIONS: 10 mL 1 % lidocaine COMPLICATIONS: None immediate. PROCEDURE: An ultrasound guided thoracentesis was thoroughly discussed with the patient and questions answered. The benefits, risks, alternatives and complications were also discussed. The patient understands and wishes to proceed with the procedure. Written consent was obtained. Ultrasound was performed to localize and mark an adequate pocket of fluid in the RIGHT chest. The area was then prepped and draped in the normal sterile fashion. 1% Lidocaine was used for local anesthesia. Under ultrasound guidance a 6 Fr Safe-T-Centesis catheter was introduced. Thoracentesis was performed. The catheter was removed and a dressing applied. FINDINGS: A total of approximately 2.4 L of hazy, dark amber fluid was removed. IMPRESSION: Successful ultrasound guided therapeutic RIGHT thoracentesis yielding 2.4 L of pleural fluid. Procedure performed by Alex Gardener, NP Roanna Banning, MD [facility] Electronically Signed   By: Roanna Banning M.D.    On: 04/25/2023 10:58   DG Chest Port 1 View  Result Date: 04/25/2023 CLINICAL DATA:  Status post thoracentesis EXAM: PORTABLE CHEST 1 VIEW COMPARISON:  Chest XR, 04/19/2023.  CT chest, 02/12/2013. FINDINGS: Cardiomediastinal silhouette is within normal limits. Lungs are well inflated. No focal consolidation or mass. No pleural effusion or pneumothorax. No acute displaced fracture. IMPRESSION: 1. No pneumothorax post RIGHT thoracentesis. 2. No acute cardiopulmonary process. Electronically Signed   By: Roanna Banning M.D.   On: 04/25/2023 09:33   US THORACENTESIS ASP PLEURAL SPACE W/IMG GUIDE  Result Date: 04/20/2023 INDICATION: Right pleural effusion EXAM: ULTRASOUND GUIDED RIGHT THORACENTESIS MEDICATIONS: 8 cc 1% lidocaine COMPLICATIONS: None immediate. PROCEDURE: An ultrasound guided thoracentesis was thoroughly discussed with the patient and questions answered. The benefits, risks, alternatives and complications were also discussed. The patient understands and wishes to proceed with the procedure. Written consent was obtained. Ultrasound was performed to localize and mark an adequate pocket of fluid in the right chest. The area was then prepped and draped in the normal sterile fashion. 1% Lidocaine was used for local anesthesia. Under ultrasound guidance a 6 Fr Safe-T-Centesis catheter was introduced. Thoracentesis was performed. The catheter was removed and a dressing applied. FINDINGS: A total of approximately 2.4 L of amber fluid was removed. IMPRESSION: Successful ultrasound guided right thoracentesis yielding 2.4 L of pleural fluid. Procedure performed by Mina Marble, PA-C Electronically Signed   By: Olive Bass M.D.   On: 04/20/2023 08:02   DG Chest 1 View  Result Date: 04/19/2023 CLINICAL DATA:  [id number] S/P thoracentesis [id number] EXAM: CHEST  1 VIEW COMPARISON:  Multiple exams earlier same day FINDINGS: Status post right thoracentesis. Trace right effusion remains. No left effusion. Normal  appearance of the cardiomediastinal silhouette. Previously seen lucency in the right lung is no longer visualized, no pneumothorax. No lobar consolidation. No acute osseous abnormality. IMPRESSION: Status post right thoracentesis, no pneumothorax visualized on repeat examination. Electronically Signed   By: Olive Bass M.D.   On: 04/19/2023 16:10   [place] ASCITES (ABDOMEN LIMITED)  Result Date: 04/19/2023 CLINICAL DATA:  Bloating, abdominal distension EXAM: LIMITED ABDOMEN ULTRASOUND FOR ASCITES TECHNIQUE: Limited ultrasound survey for ascites was performed in all four abdominal quadrants. COMPARISON:  04/11/2023 CT abdomen pelvis FINDINGS: Trace free fluid in the left lower quadrant and left upper quadrant. IMPRESSION: Trace free fluid in the left lower quadrant and left upper quadrant. Electronically Signed   By: Wiliam Ke M.D.   On: 04/19/2023 16:04   DG Chest Port 1 View  Result Date: 04/19/2023 CLINICAL DATA:  Post thoracentesis, Large right EXAM: PORTABLE CHEST 1 VIEW COMPARISON:  04/19/2023 at 1202 hours FINDINGS: Normal heart size. Small right pleural effusion, significantly decreased in size following thoracentesis. Tiny right lateral pneumothorax versus skin fold. Improved aeration at the right lung base. Left lung remains clear. IMPRESSION: 1. Small right pleural effusion, significantly decreased in size following thoracentesis. 2. Tiny right lateral pneumothorax versus skin fold. Short interval follow-up chest radiograph is recommended. These results will be called to the ordering clinician or representative by the Radiologist Assistant, and communication documented in the PACS or [employer]. Electronically Signed   By: Duanne Guess D.O.   On: 04/19/2023 15:41   DG Abd 1 View  Result Date: 04/19/2023 CLINICAL DATA:  Bloating EXAM: ABDOMEN - 1 VIEW COMPARISON:  04/10/2023 FINDINGS: Nonobstructive bowel gas pattern. Right-sided nephroureteral stent is again  noted, unchanged in  positioning. No gross free intraperitoneal air on supine view. Right-sided pleural effusion. Prior right hip ORIF. IMPRESSION: 1. Nonobstructive bowel gas pattern. 2. Right-sided pleural effusion. Electronically Signed   By: Duanne Guess D.O.   On: 04/19/2023 12:22   DG Chest 2 View  Result Date: 04/19/2023 CLINICAL DATA:  Shortness of breath.  Prostate cancer EXAM: CHEST - 2 VIEW COMPARISON:  04/11/2023 FINDINGS: Moderate-large right-sided pleural effusion with associated right basilar opacity. Left lung is clear. Heart size is normal. IMPRESSION: Moderate-large right-sided pleural effusion with associated right basilar opacity, likely atelectasis. Electronically Signed   By: Duanne Guess D.O.   On: 04/19/2023 12:21   CT Abdomen Pelvis Wo Contrast  Result Date: 04/11/2023 CLINICAL DATA:  Metastatic prostate cancer, worsening abdominal distension for 1-2 weeks. History multiple myeloma and urothelial carcinoma the distal ureter. * Tracking Code: BO * EXAM: CT ABDOMEN AND PELVIS WITHOUT CONTRAST TECHNIQUE: Multidetector CT imaging of the abdomen and pelvis was performed following the standard protocol without IV contrast. RADIATION DOSE REDUCTION: This exam was performed according to the departmental dose-optimization program which includes automated exposure control, adjustment of the mA and/or kV according to patient size and/or use of iterative reconstruction technique. COMPARISON:  PET 12/23/2022 and CT abdomen pelvis 02/07/2019. FINDINGS: Lower chest: Basilar pulmonary parenchymal cystic changes. Moderate right pleural effusion with compressive atelectasis in the right lower lobe. Known enlarging pleural nodules and masses in the posterior right hemithorax measure up to 2.2 x 4.6 cm. Heart is at the upper limits of normal in size. No pericardial effusion. Distal esophagus is grossly unremarkable. Hepatobiliary: Heterogeneous masses are seen throughout the liver and have enlarged significantly from  12/05/2022, measuring up to 6.3 x 6.8 cm. Gallbladder is grossly unremarkable. No biliary ductal dilatation. Pancreas: Negative. Spleen: Negative. Adrenals/Urinary Tract: Adrenal glands are unremarkable. Moderate right hydronephrosis with a double-J right ureteral stent in place with the proximal loop in the right intrarenal collecting system and distal loop in the bladder. Hyperattenuating and hypoattenuating lesions in the left kidney, difficult to further characterize without post-contrast imaging. Left ureter is decompressed. Bladder is low in volume. Stomach/Bowel: Stomach, small bowel, appendix and colon are unremarkable. Vascular/Lymphatic: Periportal lymph nodes measure up to 2.0 cm. Right juxtadiaphragmatic lymph nodes measure up to 2.3 cm. Abdominal and retroperitoneal lymph nodes measure up to 1.4 cm along the left external iliac chain (2/59), new. Reproductive: Prostate is atrophic or absent. Other: Mild presacral edema.  Small scattered ascites. Musculoskeletal: A lipoma is seen in the posterior right thigh. Screws in the right proximal femur. Osteopenia. Degenerative changes in the spine. Bilateral L5 pars defects without alignment abnormality. Old right rib fracture. No worrisome lytic or sclerotic lesions. IMPRESSION: 1. Small scattered ascites. No additional findings to explain the patient's abdominal distension. 2. Progressive pleural, hepatic and nodal metastatic disease. 3. Moderate right hydronephrosis with double-J ureteral stent in place. 4. Moderate right pleural effusion with compressive atelectasis in the right lower lobe. Electronically Signed   By: Leanna Battles M.D.   On: 04/11/2023 16:14   US THORACENTESIS ASP PLEURAL SPACE W/IMG GUIDE  Result Date: 04/11/2023 INDICATION: History of multiple myeloma and high-grade urothelial carcinoma, metastatic prostate cancer. Imaging found patient to have large right pleural effusion. Patient was referred for diagnostic and therapeutic right  thoracentesis. EXAM: ULTRASOUND GUIDED DIAGNOSTIC AND THERAPEUTIC RIGHT THORACENTESIS MEDICATIONS: 15 mL 1 % lidocaine, topical lidocaine spray COMPLICATIONS: None immediate. PROCEDURE: An ultrasound guided thoracentesis was thoroughly discussed with the patient and  questions answered. The benefits, risks, alternatives and complications were also discussed. The patient understands and wishes to proceed with the procedure. Written consent was obtained. Ultrasound was performed to localize and mark an adequate pocket of fluid in the right chest. The area was then prepped and draped in the normal sterile fashion. 1% Lidocaine was used for local anesthesia. Under ultrasound guidance a 6 Fr Safe-T-Centesis catheter was introduced. Thoracentesis was performed. The catheter was removed and a dressing applied. FINDINGS: A total of approximately 2 L of clear, amber fluid was removed. Samples were sent to the laboratory as requested by the clinical team. IMPRESSION: Successful ultrasound guided right thoracentesis yielding 2 L of pleural fluid. Read by: Alex Gardener, AGNP-BC Electronically Signed   By: Olive Bass M.D.   On: 04/11/2023 09:50   DG Chest Port 1 View  Result Date: 04/11/2023 CLINICAL DATA:  Follow-up thoracentesis EXAM: PORTABLE CHEST 1 VIEW COMPARISON:  04/09/2023 FINDINGS: Left chest remains clear. Marked reduction in the amount of pleural fluid on the right, although a small amount does persist. No pneumothorax. Improved aeration of the right lower lung. IMPRESSION: Marked reduction in the amount of pleural fluid on the right following thoracentesis. No pneumothorax. Improved aeration of the right lower lung. Electronically Signed   By: Paulina Fusi M.D.   On: 04/11/2023 09:44   DG Abd 2 Views  Result Date: 04/10/2023 CLINICAL DATA:  Abdominal distension. History of metastatic prostate cancer. EXAM: ABDOMEN - 2 VIEW COMPARISON:  December 13, 2022. FINDINGS: The bowel gas pattern is normal. There is no  evidence of free air. Right-sided ureteral stent is noted in grossly good position. Phleboliths are noted in the pelvis. IMPRESSION: No abnormal bowel dilatation. Electronically Signed   By: Lupita Raider M.D.   On: 04/10/2023 12:00   DG Chest 2 View  Result Date: 04/09/2023 CLINICAL DATA:  Shortness of breath, congestion. EXAM: CHEST - 2 VIEW COMPARISON:  November 28, 2022.  December 05, 2022. FINDINGS: The heart size and mediastinal contours are within normal limits. Large pleural based mass is noted anteriorly in right upper lobe. Moderate right pleural effusion is noted with associated right basilar atelectasis. Left lung is grossly unremarkable. The visualized skeletal structures are unremarkable. IMPRESSION: Moderate right pleural effusion is noted with associated right basilar atelectasis. Pleural based mass is noted anteriorly in right upper lobe consistent with metastatic disease as described on prior PET scan. Electronically Signed   By: Lupita Raider M.D.   On: 04/09/2023 16:29    Labs:  CBC: Recent Labs    03/19/23 1119 04/10/23 0927 04/19/23 0855 04/26/23 1055  WBC 4.3 6.0 6.2 8.3  HGB 10.5* 11.5* 12.0* 12.7*  HCT 33.0* 34.8* 36.4* 38.6*  PLT 184 266 312 340    COAGS: Recent Labs    10/06/22 0854  INR 1.1    BMP: Recent Labs    03/19/23 1119 04/10/23 0927 04/19/23 0854 04/26/23 1055  NA 138 138 135 138  K 4.5 5.0 4.3 4.6  CL 107 101 101 101  CO2 24 26 23 27   GLUCOSE 113* 138* 120* 117*  BUN 39* 44* 45* 58*  CALCIUM 8.9 9.8 9.0 9.2  CREATININE 2.51* 2.57* 2.38* 2.50*  GFRNONAA 25* 24* 26* 25*    LIVER FUNCTION TESTS: Recent Labs    03/19/23 1119 04/10/23 0927 04/19/23 0854 04/26/23 1055  BILITOT 0.5 0.6 0.5 0.4  AST 32 44* 41 30  ALT 35 26 18 16   ALKPHOS 110 158*  150* 136*  PROT 6.1* 5.8* 5.6* 5.9*  ALBUMIN 3.7 3.3* 3.0* 2.9*    TUMOR MARKERS: No results for input(s): "AFPTM", "CEA", "CA199", "CHROMGRNA" in the last 8760 hours.  Assessment  and Plan:  82 year old male with past medical history significant for multiple myeloma, urothelial carcinoma of distal ureter, metastatic prostate cancer, HTN, OSA on CPAP and hypokalemia presents to IR for right-sided pleural drain placement.  Patient resting in bed with wife at bedside. He is alert and oriented, calm and pleasant. He is in no distress.  Risks and benefits of pleural drain placement with moderate sedation discussed with the patient including bleeding, infection, damage to adjacent structures and sepsis.  All of the patient's questions were answered, patient is agreeable to proceed. Consent signed and in chart.  Thank you for this interesting consult.  I greatly enjoyed meeting [name] [name] and look forward to participating in their care.  A copy of this report was sent to the requesting provider on this date.  Electronically Signed: Shon Hough, NP 05/04/2023, 1:14 PM   I spent a total of 20 minutes in face to face in clinical consultation, greater than 50% of which was counseling/coordinating care for metastatic prostate cancer, recurrent pleural effusion.

## 2023-05-04 ENCOUNTER — Ambulatory Visit
Admission: RE | Admit: 2023-05-04 | Discharge: 2023-05-04 | Disposition: A | Discharge status: Home / Self Care | Source: Ambulatory Visit | Attending: Oncology | Admitting: Oncology

## 2023-05-04 ENCOUNTER — Other Ambulatory Visit: Payer: Self-pay | Admitting: *Deleted

## 2023-05-04 ENCOUNTER — Other Ambulatory Visit: Payer: Self-pay | Admitting: Hospice and Palliative Medicine

## 2023-05-04 ENCOUNTER — Other Ambulatory Visit: Payer: Self-pay

## 2023-05-04 ENCOUNTER — Encounter: Payer: Self-pay | Admitting: Radiology

## 2023-05-04 ENCOUNTER — Ambulatory Visit: Payer: Medicare Other

## 2023-05-04 DIAGNOSIS — C787 Secondary malignant neoplasm of liver and intrahepatic bile duct: Secondary | ICD-10-CM | POA: Diagnosis not present

## 2023-05-04 DIAGNOSIS — I10 Essential (primary) hypertension: Secondary | ICD-10-CM | POA: Insufficient documentation

## 2023-05-04 DIAGNOSIS — Z87891 Personal history of nicotine dependence: Secondary | ICD-10-CM | POA: Insufficient documentation

## 2023-05-04 DIAGNOSIS — E876 Hypokalemia: Secondary | ICD-10-CM | POA: Insufficient documentation

## 2023-05-04 DIAGNOSIS — G4733 Obstructive sleep apnea (adult) (pediatric): Secondary | ICD-10-CM | POA: Diagnosis not present

## 2023-05-04 DIAGNOSIS — J90 Pleural effusion, not elsewhere classified: Secondary | ICD-10-CM | POA: Diagnosis not present

## 2023-05-04 DIAGNOSIS — C61 Malignant neoplasm of prostate: Secondary | ICD-10-CM | POA: Diagnosis present

## 2023-05-04 HISTORY — PX: IR GUIDED DRAIN W CATHETER PLACEMENT: IMG719

## 2023-05-04 MED ORDER — MIDAZOLAM HCL 5 MG/5ML IJ SOLN
INTRAMUSCULAR | Status: AC | PRN
  Administered 2023-05-04: 1 mg via INTRAVENOUS

## 2023-05-04 MED ORDER — HYDROCODONE-ACETAMINOPHEN 5-325 MG PO TABS: 1.0000 | ORAL_TABLET | ORAL | 0 refills | Status: DC | PRN

## 2023-05-04 MED ORDER — SODIUM CHLORIDE 0.9 % IV SOLN: INTRAVENOUS | Status: DC

## 2023-05-04 MED ORDER — CEFAZOLIN SODIUM-DEXTROSE 2-4 GM/100ML-% IV SOLN: 2.0000 g | INTRAVENOUS | Status: DC

## 2023-05-04 MED ORDER — MIDAZOLAM HCL 2 MG/2ML IJ SOLN
INTRAMUSCULAR | Status: AC
  Filled 2023-05-04: qty 2

## 2023-05-04 MED ORDER — CEFAZOLIN SODIUM-DEXTROSE 2-4 GM/100ML-% IV SOLN
INTRAVENOUS | Status: AC
  Filled 2023-05-04: qty 100

## 2023-05-04 MED ORDER — LIDOCAINE HCL 1 % IJ SOLN
20.0000 mL | Freq: Once | INTRAMUSCULAR | Status: AC
  Administered 2023-05-04: 20 mL via INTRADERMAL

## 2023-05-04 MED ORDER — LORAZEPAM 0.5 MG PO TABS: 0.5000 mg | ORAL_TABLET | Freq: Three times a day (TID) | ORAL | 0 refills | Status: DC

## 2023-05-04 MED ORDER — FENTANYL CITRATE (PF) 100 MCG/2ML IJ SOLN
INTRAMUSCULAR | Status: AC
  Filled 2023-05-04: qty 2

## 2023-05-04 MED ORDER — FENTANYL CITRATE (PF) 100 MCG/2ML IJ SOLN
INTRAMUSCULAR | Status: AC | PRN
  Administered 2023-05-04: 50 ug via INTRAVENOUS
  Administered 2023-05-04: 25 ug via INTRAVENOUS

## 2023-05-04 NOTE — Telephone Encounter (Signed)
Call from [facility] with hospice asking for orders and parameters for draining the Pleur ex cath that patient is having placed today. She is also asking for medicine to be sent for anxiety (Lorazepam) and to refill his Hydrocodone prescription also.

## 2023-05-04 NOTE — Telephone Encounter (Signed)
Spoke to pt, he states Hospice will provide supplies.

## 2023-05-04 NOTE — Progress Notes (Signed)
Hospice requested refills of Norco.  Rx also sent for lorazepam per request for management of anxiety.

## 2023-05-04 NOTE — Addendum Note (Signed)
Addended by: Malachy Moan on: 05/04/2023 09:15 AM   Modules accepted: Orders

## 2023-05-04 NOTE — Telephone Encounter (Signed)
Verbal order called to [facility]

## 2023-05-04 NOTE — Progress Notes (Signed)
Patient clinicallly stable post Pleurx catheter placement per Dr Juliette Alcide, tolerated well. Received Versed 1 mg along with Fentanyl 75 mcg IV for procedure. Drained liter serous fluid post procedure. Report given to Weldon Picking Rn post procedure/18

## 2023-05-07 NOTE — Progress Notes (Signed)
Oley Balm, MD [facility] to [facility]  DDH       Previous Messages    ----- Message ----- From: Markus Daft Sent: 04/26/2023  12:13 PM EDT To: Ir Procedure Requests Subject: IR Pleur-x Cath Insertion                      IR Approval Request:   Procedure:    IR Pleur-x Cath Insertion  Reason:        chronic RT pleural effusions  History:         refer to chart review  Provider:     Dr Rickard Patience, MD  Provider Contact #      [facility]   [phone]

## 2023-05-10 ENCOUNTER — Telehealth: Payer: Self-pay | Admitting: *Deleted

## 2023-05-10 NOTE — Telephone Encounter (Signed)
Call from hospice nurse asking for refill of Diflucan for penile yeast and would like to have more than 1 tablet for use in future. Also requesting to be put on B12 tablet for thrush o tongue which is also beefy red. [place] also if he can go back on oral Iron tabs 65 mg. Please return her call  with orders

## 2023-05-10 NOTE — Telephone Encounter (Signed)
I spoke with hospice nurse. Orders given. Okay for fluconazole 150mg  x1.

## 2023-05-15 ENCOUNTER — Encounter: Payer: Self-pay | Admitting: Oncology

## 2023-06-04 DEATH — deceased

## 2023-10-24 ENCOUNTER — Ambulatory Visit: Payer: Medicare Other | Admitting: Urology
